# Patient Record
Sex: Female | Born: 1985
Health system: Southern US, Community
[De-identification: ages and names within clinical notes are randomized; demographics above are authoritative.]

## PROBLEM LIST (undated history)

## (undated) ENCOUNTER — Inpatient Hospital Stay: Payer: Self-pay

## (undated) DIAGNOSIS — G4730 Sleep apnea, unspecified: Secondary | ICD-10-CM

## (undated) DIAGNOSIS — F419 Anxiety disorder, unspecified: Secondary | ICD-10-CM

## (undated) DIAGNOSIS — Z87442 Personal history of urinary calculi: Secondary | ICD-10-CM

## (undated) DIAGNOSIS — I10 Essential (primary) hypertension: Secondary | ICD-10-CM

## (undated) DIAGNOSIS — F909 Attention-deficit hyperactivity disorder, unspecified type: Secondary | ICD-10-CM

## (undated) DIAGNOSIS — F32A Depression, unspecified: Secondary | ICD-10-CM

## (undated) DIAGNOSIS — F329 Major depressive disorder, single episode, unspecified: Secondary | ICD-10-CM

## (undated) DIAGNOSIS — M722 Plantar fascial fibromatosis: Secondary | ICD-10-CM

## (undated) DIAGNOSIS — E282 Polycystic ovarian syndrome: Secondary | ICD-10-CM

## (undated) HISTORY — DX: Plantar fascial fibromatosis: M72.2

## (undated) HISTORY — PX: CHOLECYSTECTOMY: SHX55

## (undated) HISTORY — PX: WISDOM TOOTH EXTRACTION: SHX21

---

## 2006-08-20 DIAGNOSIS — E282 Polycystic ovarian syndrome: Secondary | ICD-10-CM

## 2006-08-20 HISTORY — DX: Polycystic ovarian syndrome: E28.2

## 2011-12-05 DIAGNOSIS — E282 Polycystic ovarian syndrome: Secondary | ICD-10-CM | POA: Insufficient documentation

## 2011-12-05 DIAGNOSIS — Z87442 Personal history of urinary calculi: Secondary | ICD-10-CM | POA: Insufficient documentation

## 2011-12-05 DIAGNOSIS — F32A Depression, unspecified: Secondary | ICD-10-CM | POA: Insufficient documentation

## 2011-12-05 DIAGNOSIS — F329 Major depressive disorder, single episode, unspecified: Secondary | ICD-10-CM | POA: Insufficient documentation

## 2012-10-24 DIAGNOSIS — O039 Complete or unspecified spontaneous abortion without complication: Secondary | ICD-10-CM | POA: Insufficient documentation

## 2014-08-06 ENCOUNTER — Emergency Department: Payer: Self-pay | Admitting: Emergency Medicine

## 2014-08-06 LAB — CBC WITH DIFFERENTIAL/PLATELET
BASOS ABS: 0.2 10*3/uL — AB (ref 0.0–0.1)
Basophil %: 1.2 %
Eosinophil #: 0.1 10*3/uL (ref 0.0–0.7)
Eosinophil %: 0.7 %
HCT: 45 % (ref 35.0–47.0)
HGB: 15.1 g/dL (ref 12.0–16.0)
LYMPHS PCT: 3 %
Lymphocyte #: 0.4 10*3/uL — ABNORMAL LOW (ref 1.0–3.6)
MCH: 30.7 pg (ref 26.0–34.0)
MCHC: 33.6 g/dL (ref 32.0–36.0)
MCV: 92 fL (ref 80–100)
MONO ABS: 0.7 x10 3/mm (ref 0.2–0.9)
Monocyte %: 5.2 %
NEUTROS ABS: 12.7 10*3/uL — AB (ref 1.4–6.5)
Neutrophil %: 89.9 %
PLATELETS: 300 10*3/uL (ref 150–440)
RBC: 4.91 10*6/uL (ref 3.80–5.20)
RDW: 13.3 % (ref 11.5–14.5)
WBC: 14.2 10*3/uL — ABNORMAL HIGH (ref 3.6–11.0)

## 2014-08-06 LAB — URINALYSIS, COMPLETE
Bacteria: NONE SEEN
Bilirubin,UR: NEGATIVE
Glucose,UR: NEGATIVE mg/dL (ref 0–75)
Leukocyte Esterase: NEGATIVE
Nitrite: NEGATIVE
PH: 5 (ref 4.5–8.0)
Protein: NEGATIVE
SPECIFIC GRAVITY: 1.025 (ref 1.003–1.030)
WBC UR: 1 /HPF (ref 0–5)

## 2014-08-06 LAB — COMPREHENSIVE METABOLIC PANEL
ALBUMIN: 4.3 g/dL (ref 3.4–5.0)
AST: 33 U/L (ref 15–37)
Alkaline Phosphatase: 115 U/L
Anion Gap: 14 (ref 7–16)
BUN: 15 mg/dL (ref 7–18)
Bilirubin,Total: 0.7 mg/dL (ref 0.2–1.0)
CO2: 20 mmol/L — AB (ref 21–32)
CREATININE: 1 mg/dL (ref 0.60–1.30)
Calcium, Total: 9.3 mg/dL (ref 8.5–10.1)
Chloride: 108 mmol/L — ABNORMAL HIGH (ref 98–107)
EGFR (African American): >60
EGFR (Non-African Amer.): >60
Glucose: 113 mg/dL — ABNORMAL HIGH (ref 65–99)
Osmolality: 285 (ref 275–301)
Potassium: 3.5 mmol/L (ref 3.5–5.1)
SGPT (ALT): 58 U/L
Sodium: 142 mmol/L (ref 136–145)
TOTAL PROTEIN: 8.5 g/dL — AB (ref 6.4–8.2)

## 2014-08-06 LAB — LIPASE, BLOOD: LIPASE: 98 U/L (ref 73–393)

## 2015-09-02 DIAGNOSIS — E669 Obesity, unspecified: Secondary | ICD-10-CM | POA: Diagnosis not present

## 2015-09-02 DIAGNOSIS — Z79899 Other long term (current) drug therapy: Secondary | ICD-10-CM | POA: Diagnosis not present

## 2015-09-02 DIAGNOSIS — Z0000 Encounter for general adult medical examination without abnormal findings: Secondary | ICD-10-CM | POA: Diagnosis not present

## 2015-09-08 DIAGNOSIS — Z0000 Encounter for general adult medical examination without abnormal findings: Secondary | ICD-10-CM | POA: Diagnosis not present

## 2015-09-08 DIAGNOSIS — Z124 Encounter for screening for malignant neoplasm of cervix: Secondary | ICD-10-CM | POA: Diagnosis not present

## 2015-09-08 DIAGNOSIS — Z113 Encounter for screening for infections with a predominantly sexual mode of transmission: Secondary | ICD-10-CM | POA: Diagnosis not present

## 2015-11-03 DIAGNOSIS — E282 Polycystic ovarian syndrome: Secondary | ICD-10-CM | POA: Diagnosis not present

## 2015-11-03 DIAGNOSIS — Z3169 Encounter for other general counseling and advice on procreation: Secondary | ICD-10-CM | POA: Diagnosis not present

## 2015-11-07 DIAGNOSIS — E282 Polycystic ovarian syndrome: Secondary | ICD-10-CM | POA: Diagnosis not present

## 2015-11-08 DIAGNOSIS — N970 Female infertility associated with anovulation: Secondary | ICD-10-CM | POA: Diagnosis not present

## 2015-11-08 DIAGNOSIS — E282 Polycystic ovarian syndrome: Secondary | ICD-10-CM | POA: Diagnosis not present

## 2015-12-02 ENCOUNTER — Ambulatory Visit
Admission: EM | Admit: 2015-12-02 | Discharge: 2015-12-02 | Disposition: A | Discharge status: Home / Self Care | Payer: 59 | Attending: Family Medicine | Admitting: Family Medicine

## 2015-12-02 ENCOUNTER — Encounter: Payer: Self-pay | Admitting: *Deleted

## 2015-12-02 DIAGNOSIS — H1013 Acute atopic conjunctivitis, bilateral: Secondary | ICD-10-CM

## 2015-12-02 DIAGNOSIS — J0110 Acute frontal sinusitis, unspecified: Secondary | ICD-10-CM | POA: Diagnosis not present

## 2015-12-02 MED ORDER — AMOXICILLIN-POT CLAVULANATE 875-125 MG PO TABS: 1.0000 | ORAL_TABLET | Freq: Two times a day (BID) | ORAL | Status: DC

## 2015-12-02 NOTE — Discharge Instructions (Signed)
Take medication as prescribed. Rest. Drink plenty of fluids.   Follow up with your primary care physician this week as needed. Return to Urgent care for new or worsening concerns.    Sinusitis, Adult Sinusitis is redness, soreness, and inflammation of the paranasal sinuses. Paranasal sinuses are air pockets within the bones of your face. They are located beneath your eyes, in the middle of your forehead, and above your eyes. In healthy paranasal sinuses, mucus is able to drain out, and air is able to circulate through them by way of your nose. However, when your paranasal sinuses are inflamed, mucus and air can become trapped. This can allow bacteria and other germs to grow and cause infection. Sinusitis can develop quickly and last only a short time (acute) or continue over a long period (chronic). Sinusitis that lasts for more than 12 weeks is considered chronic. CAUSES Causes of sinusitis include:  Allergies.  Structural abnormalities, such as displacement of the cartilage that separates your nostrils (deviated septum), which can decrease the air flow through your nose and sinuses and affect sinus drainage.  Functional abnormalities, such as when the small hairs (cilia) that line your sinuses and help remove mucus do not work properly or are not present. SIGNS AND SYMPTOMS Symptoms of acute and chronic sinusitis are the same. The primary symptoms are pain and pressure around the affected sinuses. Other symptoms include:  Upper toothache.  Earache.  Headache.  Bad breath.  Decreased sense of smell and taste.  A cough, which worsens when you are lying flat.  Fatigue.  Fever.  Thick drainage from your nose, which often is green and may contain pus (purulent).  Swelling and warmth over the affected sinuses. DIAGNOSIS Your health care provider will perform a physical exam. During your exam, your health care provider may perform any of the following to help determine if you have  acute sinusitis or chronic sinusitis:  Look in your nose for signs of abnormal growths in your nostrils (nasal polyps).  Tap over the affected sinus to check for signs of infection.  View the inside of your sinuses using an imaging device that has a light attached (endoscope). If your health care provider suspects that you have chronic sinusitis, one or more of the following tests may be recommended:  Allergy tests.  Nasal culture. A sample of mucus is taken from your nose, sent to a lab, and screened for bacteria.  Nasal cytology. A sample of mucus is taken from your nose and examined by your health care provider to determine if your sinusitis is related to an allergy. TREATMENT Most cases of acute sinusitis are related to a viral infection and will resolve on their own within 10 days. Sometimes, medicines are prescribed to help relieve symptoms of both acute and chronic sinusitis. These may include pain medicines, decongestants, nasal steroid sprays, or saline sprays. However, for sinusitis related to a bacterial infection, your health care provider will prescribe antibiotic medicines. These are medicines that will help kill the bacteria causing the infection. Rarely, sinusitis is caused by a fungal infection. In these cases, your health care provider will prescribe antifungal medicine. For some cases of chronic sinusitis, surgery is needed. Generally, these are cases in which sinusitis recurs more than 3 times per year, despite other treatments. HOME CARE INSTRUCTIONS  Drink plenty of water. Water helps thin the mucus so your sinuses can drain more easily.  Use a humidifier.  Inhale steam 3-4 times a day (for example, sit in  the bathroom with the shower running).  Apply a warm, moist washcloth to your face 3-4 times a day, or as directed by your health care provider.  Use saline nasal sprays to help moisten and clean your sinuses.  Take medicines only as directed by your health care  provider.  If you were prescribed either an antibiotic or antifungal medicine, finish it all even if you start to feel better. SEEK IMMEDIATE MEDICAL CARE IF:  You have increasing pain or severe headaches.  You have nausea, vomiting, or drowsiness.  You have swelling around your face.  You have vision problems.  You have a stiff neck.  You have difficulty breathing.   This information is not intended to replace advice given to you by your health care provider. Make sure you discuss any questions you have with your health care provider.   Document Released: 08/06/2005 Document Revised: 08/27/2014 Document Reviewed: 08/21/2011 Elsevier Interactive Patient Education 2016 [employer].  Allergic Conjunctivitis A thin, clear membrane (conjunctiva) covers the white part of your eye and the inner surface of your eyelid. Allergic conjunctivitis happens when this membrane gets irritated. This is caused by allergies. Common things (allergens) that can cause an allergic reaction include:  Dust.  Pollen.  Mold.  Animal:  Hair.  Fur.  Skin.  Saliva or other animal fluids. This condition can make your eye red or pink. It can also make your eye feel itchy. This condition cannot be spread by one person to another person (noncontagious).  HOME CARE  Take or apply medicines only as told by your doctor.  Avoid touching or rubbing your eyes.  Apply a cool, clean washcloth to your eye for 10-20 minutes. Do this 3-4 times a day.  If you wear contact lenses, do not wear them until the irritation is gone. Wear glasses in the meantime.  Avoid wearing eye makeup until the irritation is gone.  Try to avoid whatever allergen is causing the allergic reaction. GET HELP IF:  Your symptoms get worse.  You have pus draining from your eyes.  You have new symptoms.  You have a fever.   This information is not intended to replace advice given to you by your health care provider. Make  sure you discuss any questions you have with your health care provider.   Document Released: 01/24/2010 Document Revised: 08/27/2014 Document Reviewed: 05/18/2014 Elsevier Interactive Patient Education [employer].

## 2015-12-02 NOTE — ED Provider Notes (Signed)
[facility]  ____________________________________________  Time seen: Approximately 9:09 AM  I have reviewed the triage vital signs and the nursing notes.   HISTORY  Chief Complaint Eye Drainage and Facial Pain   HPI Rhonda [name] is a 30 y.o. female  presents with complaints of 6 days of runny nose, nasal congestions, sinus drainage. Patient reports recently blowing her nose and getting very thick greenish drainage out. Patient reports that last night she did have some yellowish drainage from both of her eyes that was quick to resolve. Denies any eye itching, eye pain, foreign bodies in the eyes, pinkeye contacts, trauma to the eye, eye pain or vision changes. Patient reports that she often does have some seasonal allergy triggers. States sinuses feel clogged with some pressure in her forehead. Denies vision changes or vision loss. States occasional cough with persistent drainage.  Patient reports that she did take Sudafed at home which seemed to help but not resolve her complaints.  Denies chest pain, shortness of breath, abdominal pain, dysuria, dizziness, weakness, neck pain, back pain, fever or rash. Patient reports that she and her spouse are actively trying to conceive at this time.  Patient's last menstrual period was 11/01/2015 (approximate).   Past medical history. PCOS Depression   There are no active problems to display for this patient.   Past Surgical History  Procedure Laterality Date  . Cholecystectomy      Current Outpatient Rx  Name  Route  Sig  Dispense  Refill  . buPROPion (WELLBUTRIN XL) 300 MG 24 hr tablet   Oral   Take 300 mg by mouth daily.         . metFORMIN (GLUCOPHAGE) 500 MG tablet   Oral   Take 500 mg by mouth 2 (two) times daily with a meal.           Allergies Review of patient's allergies indicates no known allergies.  History reviewed. No pertinent family history.  Social History Social History  Substance Use  Topics  . Smoking status: Never Smoker   . Smokeless tobacco: None  . Alcohol Use: Yes    Review of Systems Constitutional: No fever/chills Eyes: No visual changes. ENT: No sore throat. Positive runny nose, nasal drainage. States occasional cough. Cardiovascular: Denies chest pain. Respiratory: Denies shortness of breath. Gastrointestinal: No abdominal pain.  No nausea, no vomiting.  No diarrhea.  No constipation. Genitourinary: Negative for dysuria. Musculoskeletal: Negative for back pain. Skin: Negative for rash. Neurological: Negative for headaches, focal weakness or numbness.  10-point ROS otherwise negative.  ____________________________________________   PHYSICAL EXAM:  VITAL SIGNS: ED Triage Vitals  Enc Vitals Group     BP 12/02/15 0844 124/80 mmHg     Pulse Rate 12/02/15 0844 79     Resp 12/02/15 0844 16     Temp 12/02/15 0844 98.1 F (36.7 C)     Temp Source 12/02/15 0844 Oral     SpO2 12/02/15 0844 100 %     Weight 12/02/15 0844 245 lb (111.131 kg)     Height 12/02/15 0844 5\' 10"  (1.778 m)     Head Cir --      Peak Flow --      Pain Score --      Pain Loc --      Pain Edu? --      Excl. in [place]? --    Constitutional: Alert and oriented. Well appearing and in no acute distress. Eyes: Conjunctivae are normal. No exudate or drainage.  PERRL. EOMI. No pain with EOMs. No erythema or swelling surrounding bilaterally. No foreign bodies visualized.  Head: Atraumatic.Mild to moderate tenderness to palpation bilateral frontal and maxillary sinuses. No swelling. No erythema.   Ears: no erythema, normal TMs bilaterally.   Nose: nasal congestion with bilateral nasal turbinate erythema and edema.   Mouth/Throat: Mucous membranes are moist.  Oropharynx non-erythematous.No tonsillar swelling or exudate.  Neck: No stridor.  No cervical spine tenderness to palpation. Hematological/Lymphatic/Immunilogical: No cervical lymphadenopathy. Cardiovascular: Normal rate, regular  rhythm. Grossly normal heart sounds.  Good peripheral circulation. Respiratory: Normal respiratory effort.  No retractions. Lungs CTAB. No wheezes, rales or rhonchi. Good air movement.  Gastrointestinal: Soft and nontender. No distention. Normal Bowel sounds. No CVA tenderness. Musculoskeletal: No lower or upper extremity tenderness nor edema.  Bilateral pedal pulses equal and easily palpated. No cervical, thoracic or lumbar tenderness to palpation.  Neurologic:  Normal speech and language. No gross focal neurologic deficits are appreciated. No gait instability. Skin:  Skin is warm, dry and intact. No rash noted. Psychiatric: Mood and affect are normal. Speech and behavior are normal.  ____________________________________________   LABS (all labs ordered are listed, but only abnormal results are displayed)  Labs Reviewed - No data to display ____________________________________________   INITIAL IMPRESSION / ASSESSMENT AND PLAN / ED COURSE  Pertinent labs & imaging results that were available during my care of the patient were reviewed by me and considered in my medical decision making (see chart for details).  Very well-appearing patient. No acute distress. Presents for the complaints of 1 week of runny nose nasal congestion, sinus pressure and sinus drainage. Also reports brief episode of drainage from both eyes yesterday. Lungs clear throughout. Abdomen soft and nontender. Sinus drainage and sinus pressure. Suspect sinusitis. As patient reports that she does have some history of seasonal allergies, eyes complaints quickly resolved, no injection or discharge, suspect allergic conjunctivitis, doubt bacterial conjunctivitis. Will treat with oral Augmentin, encouraged over-the-counter antihistamine such as Claritin only as needed. Discussed with patient as patient states that she is trying to actively conceive, discussed and cautioned in regards to use of over-the-counter medications such as  Sudafed as that would not be recommended in pregnancy especially early pregnancy. Patient verbalized understanding. Encouraged PCP follow up.   Discussed follow up with Primary care physician this week. Discussed follow up and return parameters including no resolution or any worsening concerns. Patient verbalized understanding and agreed to plan.   ____________________________________________   FINAL CLINICAL IMPRESSION(S) / ED DIAGNOSES  Final diagnoses:  Acute frontal sinusitis, recurrence not specified  Allergic conjunctivitis, bilateral      Note: This dictation was prepared with Dragon dictation along with smaller phrase technology. Any transcriptional errors that result from this process are unintentional.    Marylene Land, NP 12/02/15 1251

## 2015-12-02 NOTE — ED Notes (Signed)
Sinus pressure type pain behind eyes and frontal region. Also, states yellow drainage from eyes. Onset Sunday, worse last night and today.

## 2015-12-05 DIAGNOSIS — N970 Female infertility associated with anovulation: Secondary | ICD-10-CM | POA: Diagnosis not present

## 2015-12-15 DIAGNOSIS — N970 Female infertility associated with anovulation: Secondary | ICD-10-CM | POA: Diagnosis not present

## 2015-12-15 DIAGNOSIS — E282 Polycystic ovarian syndrome: Secondary | ICD-10-CM | POA: Diagnosis not present

## 2016-01-05 DIAGNOSIS — N970 Female infertility associated with anovulation: Secondary | ICD-10-CM | POA: Diagnosis not present

## 2016-01-17 DIAGNOSIS — N912 Amenorrhea, unspecified: Secondary | ICD-10-CM | POA: Diagnosis not present

## 2016-01-19 DIAGNOSIS — N912 Amenorrhea, unspecified: Secondary | ICD-10-CM | POA: Diagnosis not present

## 2016-01-26 DIAGNOSIS — O200 Threatened abortion: Secondary | ICD-10-CM | POA: Diagnosis not present

## 2016-02-06 DIAGNOSIS — O200 Threatened abortion: Secondary | ICD-10-CM | POA: Diagnosis not present

## 2016-02-19 DIAGNOSIS — N760 Acute vaginitis: Secondary | ICD-10-CM | POA: Diagnosis not present

## 2016-02-19 DIAGNOSIS — N898 Other specified noninflammatory disorders of vagina: Secondary | ICD-10-CM | POA: Diagnosis not present

## 2016-02-19 DIAGNOSIS — R300 Dysuria: Secondary | ICD-10-CM | POA: Diagnosis not present

## 2016-02-19 DIAGNOSIS — O26891 Other specified pregnancy related conditions, first trimester: Secondary | ICD-10-CM | POA: Diagnosis not present

## 2016-03-01 DIAGNOSIS — Z3481 Encounter for supervision of other normal pregnancy, first trimester: Secondary | ICD-10-CM | POA: Diagnosis not present

## 2016-03-01 DIAGNOSIS — N898 Other specified noninflammatory disorders of vagina: Secondary | ICD-10-CM | POA: Diagnosis not present

## 2016-03-02 ENCOUNTER — Other Ambulatory Visit: Payer: Self-pay | Admitting: Obstetrics and Gynecology

## 2016-03-02 DIAGNOSIS — Z3481 Encounter for supervision of other normal pregnancy, first trimester: Secondary | ICD-10-CM | POA: Diagnosis not present

## 2016-03-02 DIAGNOSIS — N898 Other specified noninflammatory disorders of vagina: Secondary | ICD-10-CM | POA: Diagnosis not present

## 2016-03-02 DIAGNOSIS — Z369 Encounter for antenatal screening, unspecified: Secondary | ICD-10-CM

## 2016-03-13 DIAGNOSIS — B019 Varicella without complication: Secondary | ICD-10-CM | POA: Diagnosis not present

## 2016-03-13 DIAGNOSIS — Z3A12 12 weeks gestation of pregnancy: Secondary | ICD-10-CM | POA: Diagnosis not present

## 2016-03-13 DIAGNOSIS — B029 Zoster without complications: Secondary | ICD-10-CM | POA: Diagnosis not present

## 2016-03-13 DIAGNOSIS — Z331 Pregnant state, incidental: Secondary | ICD-10-CM | POA: Diagnosis not present

## 2016-03-14 ENCOUNTER — Ambulatory Visit: Payer: Self-pay | Admitting: Physician Assistant

## 2016-03-19 ENCOUNTER — Other Ambulatory Visit: Payer: 59

## 2016-03-19 ENCOUNTER — Ambulatory Visit (HOSPITAL_BASED_OUTPATIENT_CLINIC_OR_DEPARTMENT_OTHER)
Admission: RE | Admit: 2016-03-19 | Discharge: 2016-03-19 | Disposition: A | Discharge status: Home / Self Care | Payer: 59 | Source: Ambulatory Visit | Attending: Obstetrics and Gynecology | Admitting: Obstetrics and Gynecology

## 2016-03-19 ENCOUNTER — Ambulatory Visit: Payer: 59

## 2016-03-19 ENCOUNTER — Ambulatory Visit
Admission: RE | Admit: 2016-03-19 | Discharge: 2016-03-19 | Disposition: A | Discharge status: Home / Self Care | Payer: 59 | Source: Ambulatory Visit | Attending: Obstetrics and Gynecology | Admitting: Obstetrics and Gynecology

## 2016-03-19 DIAGNOSIS — Z3A12 12 weeks gestation of pregnancy: Secondary | ICD-10-CM | POA: Insufficient documentation

## 2016-03-19 DIAGNOSIS — Z36 Encounter for antenatal screening of mother: Secondary | ICD-10-CM

## 2016-03-19 DIAGNOSIS — Z3491 Encounter for supervision of normal pregnancy, unspecified, first trimester: Secondary | ICD-10-CM | POA: Diagnosis not present

## 2016-03-19 DIAGNOSIS — Z8279 Family history of other congenital malformations, deformations and chromosomal abnormalities: Secondary | ICD-10-CM | POA: Diagnosis not present

## 2016-03-19 DIAGNOSIS — Z369 Encounter for antenatal screening, unspecified: Secondary | ICD-10-CM | POA: Insufficient documentation

## 2016-03-19 DIAGNOSIS — Z3481 Encounter for supervision of other normal pregnancy, first trimester: Secondary | ICD-10-CM | POA: Insufficient documentation

## 2016-03-19 HISTORY — DX: Polycystic ovarian syndrome: E28.2

## 2016-03-19 NOTE — Progress Notes (Signed)
Referring physician:  [facility] Length of Consultation: 45 minutes   Ms. Marchuk  was referred to [facility] for genetic counseling to review prenatal screening and testing options.  This note summarizes the information we discussed.    We offered the following routine screening tests for this pregnancy:  First trimester screening, which includes nuchal translucency ultrasound screen and first trimester maternal serum marker screening.  The nuchal translucency has approximately an 80% detection rate for Down syndrome and can be positive for other chromosome abnormalities as well as congenital heart defects.  When combined with a maternal serum marker screening, the detection rate is up to 90% for Down syndrome and up to 97% for trisomy 18.     Maternal serum marker screening, a blood test that measures pregnancy proteins, can provide risk assessments for Down syndrome, trisomy 18, and open neural tube defects (spina bifida, anencephaly). Because it does not directly examine the fetus, it cannot positively diagnose or rule out these problems.  Targeted ultrasound uses high frequency sound waves to create an image of the developing fetus.  An ultrasound is often recommended as a routine means of evaluating the pregnancy.  It is also used to screen for fetal anatomy problems (for example, a heart defect) that might be suggestive of a chromosomal or other abnormality.   Should these screening tests indicate an increased concern, then the following additional testing options would be offered:  The chorionic villus sampling procedure is available for first trimester chromosome analysis.  This involves the withdrawal of a small amount of chorionic villi (tissue from the developing placenta).  Risk of pregnancy loss is estimated to be approximately 1 in 200 to 1 in 100 (0.5 to 1%).  There is approximately a 1% (1 in 100) chance that the CVS chromosome results will be  unclear.  Chorionic villi cannot be tested for neural tube defects.     Amniocentesis involves the removal of a small amount of amniotic fluid from the sac surrounding the fetus with the use of a thin needle inserted through the maternal abdomen and uterus.  Ultrasound guidance is used throughout the procedure.  Fetal cells from amniotic fluid are directly evaluated and > 99.5% of chromosome problems and > 98% of open neural tube defects can be detected. This procedure is generally performed after the 15th week of pregnancy.  The main risks to this procedure include complications leading to miscarriage in less than 1 in 200 cases (0.5%).  As another option for information if the pregnancy is suspected to be an an increased chance for certain chromosome conditions, we also reviewed the availability of cell free fetal DNA testing from maternal blood to determine whether or not the baby may have either Down syndrome, trisomy 32, or trisomy 19.  This test utilizes a maternal blood sample and DNA sequencing technology to isolate circulating cell free fetal DNA from maternal plasma.  The fetal DNA can then be analyzed for DNA sequences that are derived from the three most common chromosomes involved in aneuploidy, chromosomes 13, 18, and 21.  If the overall amount of DNA is greater than the expected level for any of these chromosomes, aneuploidy is suspected.  While we do not consider it a replacement for invasive testing and karyotype analysis, a negative result from this testing would be reassuring, though not a guarantee of a normal chromosome complement for the baby.  An abnormal result is certainly suggestive of an abnormal chromosome complement, though  we would still recommend CVS or amniocentesis to confirm any findings from this testing.  Cystic Fibrosis and Spinal Muscular Atrophy (SMA) screening were also discussed with the patient. Both conditions are recessive, which means that both parents must be  carriers in order to have a child with the disease.  Cystic fibrosis (CF) is one of the most common genetic conditions in persons of Caucasian ancestry.  This condition occurs in approximately 1 in 2,500 Caucasian persons and results in thickened secretions in the lungs, digestive, and reproductive systems.  For a baby to be at risk for having CF, both of the parents must be carriers for this condition.  Approximately 1 in 43 Caucasian persons is a carrier for CF.  Current carrier testing looks for the most common mutations in the gene for CF and can detect approximately 90% of carriers in the Caucasian population.  This means that the carrier screening can greatly reduce, but cannot eliminate, the chance for an individual to have a child with CF.  If an individual is found to be a carrier for CF, then carrier testing would be available for the partner. As part of [place] newborn screening profile, all babies born in the state of [place] will have a two-tier screening process.  Specimens are first tested to determine the concentration of immunoreactive trypsinogen (IRT).  The top 5% of specimens with the highest IRT values then undergo DNA testing using a panel of over 40 common CF mutations. SMA is a neurodegenerative disorder that leads to atrophy of skeletal muscle and overall weakness.  This condition is also more prevalent in the Caucasian population, with 1 in 40-1 in 60 persons being a carrier and 1 in 6,000-1 in 10,000 children being affected.  There are multiple forms of the disease, with some causing death in infancy to other forms with survival into adulthood.  The genetics of SMA is complex, but carrier screening can detect up to 95% of carriers in the Caucasian population.  Similar to CF, a negative result can greatly reduce, but cannot eliminate, the chance to have a child with SMA.  We obtained a detailed family history and pregnancy history.  Ms. Fuhrer reported that she has one  paternal first cousin with a structural heart defect and that her mother has a heart murmur.  The father of the baby also reported that his mother had surgery for a structural heart defect in childhood and one of his maternal cousins has a heart defect.  We discussed that congenital heart defects (CHDs) occur in approximately 1 in 200 births.  Congenital heart defects are often thought to be multifactorial, meaning due to complex interactions between genetic and environmental factors, although some specific genetic changes and syndromes are associated with congenital heart defects.  It would be important to know if genetic tests were done on any of these relatives to rule out genetic syndromes in order to better assess the risk in this family.   In the absence of an underlying genetic condition, the chance of congenital heart defects in relatives of individuals with congenital heart defects is likely somewhat increased, though without more information the chances may be difficult to quantify.  For this reason, it would be reasonable to consider a fetal echocardiogram at 22 to 24 weeks in this and subsequent pregnancies for this couple.  The remainder of the family history was reported to be unremarkable for birth defects, mental retardation, recurrent pregnancy loss or known chromosome abnormalities.  Ms. Folkes  stated that this is her third pregnancy.  She reported that her first pregnancy resulted in the loss of twins at [redacted] weeks gestation due to PROM followed by infection.  Chromosome analysis and chromosomal microarray were normal on both twins.  Her second pregnancy resulted in the birth of a healthy son, now 14 years old.  In the current pregnancy, she reports no complications.  She did recently develop shingles, though this is not expected to be harmful to the pregnancy.  She is also prescribed wellbutrin and metformin.  There is limited data on the use of metformin in pregnancy, though it has not bee shown  to increase the risk for birth defects.  Wellbutrin has been suggested in some studies to be associated with structural heart defects, but this data has been inconsistent and not confirmed in other studies.   After consideration of the options, Ms. Heichelbech elected to proceed with first trimester screening and carrier testing for CF and SMA.  An ultrasound was performed at the time of the visit.   Fetal anatomy could not be assessed due to early gestational age.  Please refer to the ultrasound report for details of that study.  Ms. Riemersma was encouraged to call with questions or concerns.  We can be contacted at [phone].    Wilburt Finlay, MS, CGC

## 2016-03-20 DIAGNOSIS — Z331 Pregnant state, incidental: Secondary | ICD-10-CM | POA: Diagnosis not present

## 2016-03-20 DIAGNOSIS — N898 Other specified noninflammatory disorders of vagina: Secondary | ICD-10-CM | POA: Diagnosis not present

## 2016-03-20 DIAGNOSIS — B373 Candidiasis of vulva and vagina: Secondary | ICD-10-CM | POA: Diagnosis not present

## 2016-03-20 LAB — MISC LABCORP TEST (SEND OUT): LABCORP TEST CODE: 450010

## 2016-03-22 ENCOUNTER — Telehealth: Payer: Self-pay | Admitting: Obstetrics and Gynecology

## 2016-03-22 NOTE — Telephone Encounter (Signed)
  Ms. Mosbey elected to undergo First Trimester screening on 03/19/2016.  To review, first trimester screening, includes nuchal translucency ultrasound screen and/or first trimester maternal serum marker screening.  The nuchal translucency has approximately an 80% detection rate for Down syndrome and can be positive for other chromosome abnormalities as well as heart defects.  When combined with a maternal serum marker screening, the detection rate is up to 90% for Down syndrome and up to 97% for trisomy 13 and 18.     The results of the First Trimester Nuchal Translucency and Biochemical Screening were within normal range.  The risk for Down syndrome is now estimated to be less than 1 in 10,000.  The risk for Trisomy 13/18 is also estimated to be less than 1 in 10,000.  Should more definitive information be desired, we would offer amniocentesis.  Because we do not yet know the effectiveness of combined first and second trimester screening, we do not recommend a maternal serum screen to assess the chance for chromosome conditions.  However, if screening for neural tube defects is desired, maternal serum screening for AFP only can be performed between 15 and [redacted] weeks gestation.     Wilburt Finlay, MS, CGC

## 2016-03-26 LAB — CYSTIC FIBROSIS GENE TEST

## 2016-03-29 DIAGNOSIS — N760 Acute vaginitis: Secondary | ICD-10-CM | POA: Diagnosis not present

## 2016-04-02 ENCOUNTER — Telehealth: Payer: Self-pay | Admitting: Obstetrics and Gynecology

## 2016-04-02 NOTE — Telephone Encounter (Signed)
We are pleased to inform Rhonda [name] that the results of the recent screening test for Cystic fibrosis (CF) and Spinal muscular atrophy (SMA)  are now available.    The results of the SMA carrier screening are normal.  SMA is also a recessive genetic condition with variable age of onset and severity caused by mutations in the SMN1 gene.  This carrier testing assesses the number of copies of this gene.  Persons with one copy of the SMN1 gene are carriers, and those with no copies are affected with the condition.  Individuals with two or more copies have a reduced chance to be a carrier.  Not all mutations can be detected with this testing, though it can detect 94.8% of carriers in the Caucasian population.  The results revealed that Rhonda [name] has an SMN1 copy number of 2, thus reducing her chance to be a carrier from 1 in 66 to 1 in 834.  Again, this testing cannot eliminate the chance to have a child with SMA, but dramatically reduces the chance.    CF is a genetic condition that occurs most often in Caucasian persons.  It primarily affects the lungs, digestive, and reproductive systems.  For someone to be at risk for having CF, both of their parents must be carriers for CF.  The testing can detect many persons who are carriers for CF and therefore determine if the pregnancy is at an increased risk for this condition.    The blood test results were negative when examined for the 32 most common mutations (or changes) in the gene for CF.  This means that she does not carry any of the most common changes in this gene.  Testing for these 23 mutations detects approximately 90% of carriers who are Caucasian.  Therefore, the chance that she is a carrier based on this negative result has been reduced from 1 in 25 to approximately 1 in 240.  Because this testing cannot detect all changes that may cause CF, we cannot eliminate the chance that this individual is a carrier completely.  If there are any questions or  concerns, please feel free to contact our office at [phone].      Wilburt Finlay, MS, CGC

## 2016-04-09 DIAGNOSIS — O09292 Supervision of pregnancy with other poor reproductive or obstetric history, second trimester: Secondary | ICD-10-CM | POA: Diagnosis not present

## 2016-04-09 DIAGNOSIS — O09212 Supervision of pregnancy with history of pre-term labor, second trimester: Secondary | ICD-10-CM | POA: Diagnosis not present

## 2016-04-11 DIAGNOSIS — O09212 Supervision of pregnancy with history of pre-term labor, second trimester: Secondary | ICD-10-CM | POA: Diagnosis not present

## 2016-04-12 DIAGNOSIS — B9689 Other specified bacterial agents as the cause of diseases classified elsewhere: Secondary | ICD-10-CM | POA: Diagnosis not present

## 2016-04-12 DIAGNOSIS — N760 Acute vaginitis: Secondary | ICD-10-CM | POA: Diagnosis not present

## 2016-04-12 DIAGNOSIS — N898 Other specified noninflammatory disorders of vagina: Secondary | ICD-10-CM | POA: Diagnosis not present

## 2016-04-17 DIAGNOSIS — B029 Zoster without complications: Secondary | ICD-10-CM | POA: Insufficient documentation

## 2016-04-18 DIAGNOSIS — O09212 Supervision of pregnancy with history of pre-term labor, second trimester: Secondary | ICD-10-CM | POA: Diagnosis not present

## 2016-04-25 DIAGNOSIS — Z8751 Personal history of pre-term labor: Secondary | ICD-10-CM | POA: Diagnosis not present

## 2016-04-29 DIAGNOSIS — N760 Acute vaginitis: Secondary | ICD-10-CM | POA: Diagnosis not present

## 2016-05-02 DIAGNOSIS — Z8751 Personal history of pre-term labor: Secondary | ICD-10-CM | POA: Diagnosis not present

## 2016-05-04 DIAGNOSIS — O09212 Supervision of pregnancy with history of pre-term labor, second trimester: Secondary | ICD-10-CM | POA: Diagnosis not present

## 2016-05-04 DIAGNOSIS — O09292 Supervision of pregnancy with other poor reproductive or obstetric history, second trimester: Secondary | ICD-10-CM | POA: Diagnosis not present

## 2016-05-07 ENCOUNTER — Observation Stay
Admission: EM | Admit: 2016-05-07 | Discharge: 2016-05-07 | Disposition: A | Discharge status: Home / Self Care | Payer: 59 | Attending: Obstetrics & Gynecology | Admitting: Obstetrics & Gynecology

## 2016-05-07 ENCOUNTER — Encounter: Payer: Self-pay | Admitting: *Deleted

## 2016-05-07 DIAGNOSIS — R102 Pelvic and perineal pain: Secondary | ICD-10-CM | POA: Diagnosis not present

## 2016-05-07 DIAGNOSIS — O09292 Supervision of pregnancy with other poor reproductive or obstetric history, second trimester: Secondary | ICD-10-CM | POA: Diagnosis not present

## 2016-05-07 DIAGNOSIS — O26892 Other specified pregnancy related conditions, second trimester: Secondary | ICD-10-CM | POA: Diagnosis not present

## 2016-05-07 DIAGNOSIS — Z369 Encounter for antenatal screening, unspecified: Secondary | ICD-10-CM

## 2016-05-07 DIAGNOSIS — Z3A20 20 weeks gestation of pregnancy: Secondary | ICD-10-CM | POA: Diagnosis not present

## 2016-05-07 DIAGNOSIS — Z3492 Encounter for supervision of normal pregnancy, unspecified, second trimester: Secondary | ICD-10-CM | POA: Diagnosis not present

## 2016-05-07 DIAGNOSIS — Z3490 Encounter for supervision of normal pregnancy, unspecified, unspecified trimester: Secondary | ICD-10-CM

## 2016-05-07 HISTORY — DX: Major depressive disorder, single episode, unspecified: F32.9

## 2016-05-07 HISTORY — DX: Depression, unspecified: F32.A

## 2016-05-07 LAB — URINALYSIS COMPLETE WITH MICROSCOPIC (ARMC ONLY)
BACTERIA UA: NONE SEEN
BILIRUBIN URINE: NEGATIVE
GLUCOSE, UA: NEGATIVE mg/dL
Hgb urine dipstick: NEGATIVE
Ketones, ur: NEGATIVE mg/dL
Leukocytes, UA: NEGATIVE
Nitrite: NEGATIVE
Protein, ur: NEGATIVE mg/dL
RBC / HPF: NONE SEEN RBC/hpf (ref 0–5)
SQUAMOUS EPITHELIAL / LPF: NONE SEEN
Specific Gravity, Urine: 1.001 — ABNORMAL LOW (ref 1.005–1.030)
pH: 7 (ref 5.0–8.0)

## 2016-05-07 NOTE — Progress Notes (Signed)
Dr Leonides Schanz given report,  requesting a v/e, and another doppler of FHR. If no concerns- pt maybe d/c'ed home with instructions to keep next appointment

## 2016-05-07 NOTE — Discharge Summary (Signed)
Patient discharged home, discharge instructions given, patient states understanding. Patient left floor in stable condition, denies any other needs at this time. Patient to keep next scheduled OB appointment 

## 2016-05-07 NOTE — Discharge Instructions (Signed)
Drink plenty of fluid and get plenty of rest. Call your provider for any other concerns °

## 2016-05-14 NOTE — Discharge Summary (Signed)
Rhonda [name] is a 30 y.o. female. She is at [redacted] weeks gestation.  Chief Complaint: pelvic pressure  S: Resting comfortably. no CTX, no VB.no LOF,  Active fetal movement.   Location: pelvis Context: Rhonda [name] has a history of a 19 week loss, and began feeling pelvic pressure today which was similar to the experience she had during that time.  This concerned her and she sought attention. Onset/timing/duration: recent Quality: pressure Severity:  mild Aggravating factors: none Alleviating factors: none Associated signs/symptoms: denies cramping, back pain, dysuria, blood in urine or from vagina, no constipation/diarrhea, no recent trauma.   Maternal Medical History:   Past Medical History:  Diagnosis Date  . Depression    treated  . PCOS (polycystic ovarian syndrome) 2008    Past Surgical History:  Procedure Laterality Date  . CHOLECYSTECTOMY      No Known Allergies  Prior to Admission medications   Medication Sig Start Date End Date Taking? Authorizing Provider  buPROPion (WELLBUTRIN XL) 300 MG 24 hr tablet Take 300 mg by mouth daily.   Yes Historical Provider, MD  folic acid (FOLVITE) 1 MG tablet Take 1 mg by mouth daily.   Yes Historical Provider, MD  metFORMIN (GLUCOPHAGE) 500 MG tablet Take 500 mg by mouth 2 (two) times daily with a meal.   Yes Historical Provider, MD  Prenatal Vit-Fe Fumarate-FA (PRENATAL MULTIVITAMIN) TABS tablet Take 1 tablet by mouth daily at 12 noon.   Yes Historical Provider, MD     Prenatal care site: [place] History: She  reports that she has never smoked. She has never used smokeless tobacco. She reports that she does not drink alcohol or use drugs.  Family History: family history includes Diabetes in her paternal grandmother.   Review of Systems: A full review of systems was performed and negative except as noted in the HPI.     O:  BP 129/74   Pulse 79   Resp 18   Ht 5\' 10"  (1.778 m)   Wt 116.6 kg (257 lb)   LMP  12/15/2015   BMI 36.88 kg/m   Constitutional: NAD, AAOx3  HE/ENT: extraocular movements grossly intact, moist mucous membranes CV: RRR PULM: nl respiratory effort, CTABL     Abd: gravid, non-tender, non-distended, soft      Ext: Non-tender, Nonedmeatous   Psych: mood appropriate, speech normal Pelvic: external normal, no discharge, no leaking fluid, cervix closed/long/high  FHT: 138 TOCO: quiet    A/P:  30yo G3P1101 @ 20weeks with pelvic pressure.   Labor: not present.   IUP: stable, no signs of compromise   D/c home stable, precautions reviewed, follow-up as scheduled.   ----- Larey Days, MD Attending Obstetrician and Gynecologist [facility], Department of [facility]

## 2016-05-16 DIAGNOSIS — Z8751 Personal history of pre-term labor: Secondary | ICD-10-CM | POA: Diagnosis not present

## 2016-05-23 DIAGNOSIS — Z8751 Personal history of pre-term labor: Secondary | ICD-10-CM | POA: Diagnosis not present

## 2016-05-28 DIAGNOSIS — O09212 Supervision of pregnancy with history of pre-term labor, second trimester: Secondary | ICD-10-CM | POA: Diagnosis not present

## 2016-05-28 DIAGNOSIS — O09292 Supervision of pregnancy with other poor reproductive or obstetric history, second trimester: Secondary | ICD-10-CM | POA: Diagnosis not present

## 2016-05-30 DIAGNOSIS — Z8751 Personal history of pre-term labor: Secondary | ICD-10-CM | POA: Diagnosis not present

## 2016-05-31 DIAGNOSIS — N760 Acute vaginitis: Secondary | ICD-10-CM | POA: Diagnosis not present

## 2016-06-06 DIAGNOSIS — Z8751 Personal history of pre-term labor: Secondary | ICD-10-CM | POA: Diagnosis not present

## 2016-06-14 DIAGNOSIS — Z8751 Personal history of pre-term labor: Secondary | ICD-10-CM | POA: Diagnosis not present

## 2016-06-18 DIAGNOSIS — O26892 Other specified pregnancy related conditions, second trimester: Secondary | ICD-10-CM | POA: Diagnosis not present

## 2016-06-18 DIAGNOSIS — N898 Other specified noninflammatory disorders of vagina: Secondary | ICD-10-CM | POA: Diagnosis not present

## 2016-06-20 DIAGNOSIS — Z8751 Personal history of pre-term labor: Secondary | ICD-10-CM | POA: Diagnosis not present

## 2016-06-25 DIAGNOSIS — O09292 Supervision of pregnancy with other poor reproductive or obstetric history, second trimester: Secondary | ICD-10-CM | POA: Diagnosis not present

## 2016-06-27 DIAGNOSIS — Z8751 Personal history of pre-term labor: Secondary | ICD-10-CM | POA: Diagnosis not present

## 2016-06-29 DIAGNOSIS — Z3482 Encounter for supervision of other normal pregnancy, second trimester: Secondary | ICD-10-CM | POA: Diagnosis not present

## 2016-06-29 DIAGNOSIS — Z3480 Encounter for supervision of other normal pregnancy, unspecified trimester: Secondary | ICD-10-CM | POA: Diagnosis not present

## 2016-07-02 DIAGNOSIS — Z23 Encounter for immunization: Secondary | ICD-10-CM | POA: Diagnosis not present

## 2016-07-04 DIAGNOSIS — O09212 Supervision of pregnancy with history of pre-term labor, second trimester: Secondary | ICD-10-CM | POA: Diagnosis not present

## 2016-07-04 DIAGNOSIS — Z8751 Personal history of pre-term labor: Secondary | ICD-10-CM | POA: Diagnosis not present

## 2016-07-11 DIAGNOSIS — Z8751 Personal history of pre-term labor: Secondary | ICD-10-CM | POA: Diagnosis not present

## 2016-07-16 DIAGNOSIS — N898 Other specified noninflammatory disorders of vagina: Secondary | ICD-10-CM | POA: Diagnosis not present

## 2016-07-16 DIAGNOSIS — N765 Ulceration of vagina: Secondary | ICD-10-CM | POA: Diagnosis not present

## 2016-07-16 DIAGNOSIS — O26893 Other specified pregnancy related conditions, third trimester: Secondary | ICD-10-CM | POA: Diagnosis not present

## 2016-07-18 DIAGNOSIS — O09213 Supervision of pregnancy with history of pre-term labor, third trimester: Secondary | ICD-10-CM | POA: Diagnosis not present

## 2016-07-23 DIAGNOSIS — O09292 Supervision of pregnancy with other poor reproductive or obstetric history, second trimester: Secondary | ICD-10-CM | POA: Diagnosis not present

## 2016-07-25 DIAGNOSIS — Z8751 Personal history of pre-term labor: Secondary | ICD-10-CM | POA: Diagnosis not present

## 2016-07-31 DIAGNOSIS — O09213 Supervision of pregnancy with history of pre-term labor, third trimester: Secondary | ICD-10-CM | POA: Diagnosis not present

## 2016-07-31 DIAGNOSIS — Z8751 Personal history of pre-term labor: Secondary | ICD-10-CM | POA: Diagnosis not present

## 2016-08-07 DIAGNOSIS — N898 Other specified noninflammatory disorders of vagina: Secondary | ICD-10-CM | POA: Diagnosis not present

## 2016-08-07 DIAGNOSIS — O26893 Other specified pregnancy related conditions, third trimester: Secondary | ICD-10-CM | POA: Diagnosis not present

## 2016-08-07 DIAGNOSIS — Z8751 Personal history of pre-term labor: Secondary | ICD-10-CM | POA: Diagnosis not present

## 2016-08-07 DIAGNOSIS — O09212 Supervision of pregnancy with history of pre-term labor, second trimester: Secondary | ICD-10-CM | POA: Diagnosis not present

## 2016-08-14 DIAGNOSIS — Z8751 Personal history of pre-term labor: Secondary | ICD-10-CM | POA: Diagnosis not present

## 2016-08-20 NOTE — L&D Delivery Note (Signed)
Delivery Note At 5:21 AM a viable and healthy female "Rhonda [name]" was delivered via Vaginal, Spontaneous Delivery (Presentation: OA).  APGAR: 8,9 ; weight pending.   Placenta status: spontaneous, intact.  Cord: 3VC  with the following complications: none.   Anesthesia:  epidural Episiotomy: None Lacerations:  1st deg Suture Repair: 2.0 3.0 vicryl Est. Blood Loss (mL):  300  Mom to postpartum.  Baby to Couplet care / Skin to Skin.  31yo G3P1011 at 39+6wks, admitted for iol for gHTN with a 4cm cervix and GBS pos. She progressed quickly on pitocin just before delivery, from 5cm to 6cm in the hour before delivery. She progressed from 6cm to 10 cm with pushing in 10 min. Her membranes did not rupture until the fetal head was emerging. I was called at 0519 and she delivered at 0521. She was attended by the nursing staff and the MD entered the room as cord was being clamped. The placenta delivered spontaneously and was intact.  A 1st deg stellate lac was repaired with 2-0 and 3-0 vicryl and a large clot extracted from the lower uterine segment. The baby was placed promptly on the maternal abdomen and was nursing when I left the room.  Benjaman Kindler 09/19/2016, 5:51 AM

## 2016-08-21 DIAGNOSIS — Z8751 Personal history of pre-term labor: Secondary | ICD-10-CM | POA: Diagnosis not present

## 2016-08-28 DIAGNOSIS — Z3483 Encounter for supervision of other normal pregnancy, third trimester: Secondary | ICD-10-CM | POA: Diagnosis not present

## 2016-09-14 ENCOUNTER — Inpatient Hospital Stay
Admission: EM | Admit: 2016-09-14 | Discharge: 2016-09-14 | Disposition: A | Discharge status: Home / Self Care | Payer: 59 | Attending: Obstetrics and Gynecology | Admitting: Obstetrics and Gynecology

## 2016-09-14 DIAGNOSIS — O4292 Full-term premature rupture of membranes, unspecified as to length of time between rupture and onset of labor: Secondary | ICD-10-CM | POA: Insufficient documentation

## 2016-09-14 DIAGNOSIS — Z369 Encounter for antenatal screening, unspecified: Secondary | ICD-10-CM

## 2016-09-14 DIAGNOSIS — Z3A39 39 weeks gestation of pregnancy: Secondary | ICD-10-CM | POA: Diagnosis not present

## 2016-09-14 NOTE — Discharge Instructions (Signed)
Discharge instructions given, patient stated understanding. Labor precautions given.

## 2016-09-14 NOTE — OB Triage Provider Note (Signed)
TRIAGE VISIT with NST  CC: Leaking fluid in pregnancy   Rhonda [name] is a 30 y.o. G3P1101. She is at [redacted]w[redacted]d gestation.  S: Resting comfortably. no CTX, no VB. Active fetal movement. Concerned about leaking  At 0830 this morning.  O:  BP 133/85   Pulse 83   LMP 12/15/2015  No results found for this or any previous visit (from the past 48 hour(s)).   Gen: NAD, AAOx3      Abd: FNTTP      Ext: Non-tender, Nonedmeatous    FHT: 135, mod var, +accels, no decels TOCO: quiet SVE: Dilation: 3 Effacement (%): 80 Cervical Position: Posterior Station: -3 Presentation: Vertex Exam by:: JCM   A/P:  31 y.o. G3P1101 at [redacted]w[redacted]d with R/o ROM.   Labor: not present.   R/o ROM: SSE negative x 2. Nit neg, no leaking  FWB: NST is Reassuring Cat 1 tracing.  D/c home stable, precautions reviewed, follow-up as scheduled.

## 2016-09-14 NOTE — OB Triage Note (Signed)
Pt arrived to OBS rm 3 with c/o LOF starting at 0830 this morning. Some non painful contractions. Pt placed on monitor and oriented to room.

## 2016-09-18 ENCOUNTER — Inpatient Hospital Stay
Admission: EM | Admit: 2016-09-18 | Discharge: 2016-09-20 | DRG: 775 | Disposition: A | Discharge status: Home / Self Care | Payer: 59 | Attending: Obstetrics and Gynecology | Admitting: Obstetrics and Gynecology

## 2016-09-18 DIAGNOSIS — Z6838 Body mass index (BMI) 38.0-38.9, adult: Secondary | ICD-10-CM

## 2016-09-18 DIAGNOSIS — O99214 Obesity complicating childbirth: Secondary | ICD-10-CM | POA: Diagnosis present

## 2016-09-18 DIAGNOSIS — Z369 Encounter for antenatal screening, unspecified: Secondary | ICD-10-CM

## 2016-09-18 DIAGNOSIS — O99820 Streptococcus B carrier state complicating pregnancy: Secondary | ICD-10-CM | POA: Diagnosis present

## 2016-09-18 DIAGNOSIS — O134 Gestational [pregnancy-induced] hypertension without significant proteinuria, complicating childbirth: Principal | ICD-10-CM | POA: Diagnosis present

## 2016-09-18 DIAGNOSIS — E669 Obesity, unspecified: Secondary | ICD-10-CM | POA: Diagnosis present

## 2016-09-18 DIAGNOSIS — Z3A39 39 weeks gestation of pregnancy: Secondary | ICD-10-CM

## 2016-09-18 DIAGNOSIS — Z833 Family history of diabetes mellitus: Secondary | ICD-10-CM

## 2016-09-18 DIAGNOSIS — O139 Gestational [pregnancy-induced] hypertension without significant proteinuria, unspecified trimester: Secondary | ICD-10-CM | POA: Diagnosis present

## 2016-09-18 DIAGNOSIS — O99824 Streptococcus B carrier state complicating childbirth: Secondary | ICD-10-CM | POA: Diagnosis not present

## 2016-09-18 LAB — PROTEIN / CREATININE RATIO, URINE
CREATININE, URINE: 80 mg/dL
Protein Creatinine Ratio: 0.09 mg/mg{Cre} (ref 0.00–0.15)
Total Protein, Urine: 7 mg/dL

## 2016-09-18 LAB — CBC
HCT: 37.2 % (ref 35.0–47.0)
Hemoglobin: 13.1 g/dL (ref 12.0–16.0)
MCH: 31.4 pg (ref 26.0–34.0)
MCHC: 35.3 g/dL (ref 32.0–36.0)
MCV: 88.9 fL (ref 80.0–100.0)
PLATELETS: 258 10*3/uL (ref 150–440)
RBC: 4.19 MIL/uL (ref 3.80–5.20)
RDW: 13.6 % (ref 11.5–14.5)
WBC: 10.8 10*3/uL (ref 3.6–11.0)

## 2016-09-18 LAB — COMPREHENSIVE METABOLIC PANEL
ALK PHOS: 166 U/L — AB (ref 38–126)
ALT: 16 U/L (ref 14–54)
ANION GAP: 9 (ref 5–15)
AST: 22 U/L (ref 15–41)
Albumin: 3.4 g/dL — ABNORMAL LOW (ref 3.5–5.0)
BILIRUBIN TOTAL: 0.6 mg/dL (ref 0.3–1.2)
BUN: 9 mg/dL (ref 6–20)
CO2: 19 mmol/L — AB (ref 22–32)
CREATININE: 0.61 mg/dL (ref 0.44–1.00)
Calcium: 8.7 mg/dL — ABNORMAL LOW (ref 8.9–10.3)
Chloride: 104 mmol/L (ref 101–111)
GFR calc non Af Amer: >60 mL/min (ref >60)
Glucose, Bld: 74 mg/dL (ref 65–99)
Potassium: 3.6 mmol/L (ref 3.5–5.1)
SODIUM: 132 mmol/L — AB (ref 135–145)
TOTAL PROTEIN: 7 g/dL (ref 6.5–8.1)

## 2016-09-18 LAB — TYPE AND SCREEN
ABO/RH(D): A POS
ANTIBODY SCREEN: NEGATIVE

## 2016-09-18 LAB — URIC ACID: Uric Acid, Serum: 5.3 mg/dL (ref 2.3–6.6)

## 2016-09-18 MED ORDER — SODIUM CHLORIDE 0.9 % IV SOLN: 1.0000 g | Freq: Four times a day (QID) | INTRAVENOUS | Status: DC

## 2016-09-18 MED ORDER — HYDRALAZINE HCL 20 MG/ML IJ SOLN
10.0000 mg | Freq: Once | INTRAMUSCULAR | Status: DC | PRN
  Filled 2016-09-18: qty 1

## 2016-09-18 MED ORDER — SODIUM CHLORIDE 0.9 % IV SOLN
2.0000 g | Freq: Once | INTRAVENOUS | Status: AC
  Administered 2016-09-18: 2 g via INTRAVENOUS
  Filled 2016-09-18: qty 2000

## 2016-09-18 MED ORDER — OXYTOCIN BOLUS FROM INFUSION
500.0000 mL | Freq: Once | INTRAVENOUS | Status: AC
  Administered 2016-09-19: 500 mL via INTRAVENOUS

## 2016-09-18 MED ORDER — OXYTOCIN 40 UNITS IN LACTATED RINGERS INFUSION - SIMPLE MED: 2.5000 [IU]/h | INTRAVENOUS | Status: DC

## 2016-09-18 MED ORDER — MISOPROSTOL 200 MCG PO TABS
ORAL_TABLET | ORAL | Status: AC
  Filled 2016-09-18: qty 4

## 2016-09-18 MED ORDER — SODIUM CHLORIDE 0.9 % IV SOLN
1.0000 g | INTRAVENOUS | Status: DC
  Administered 2016-09-18 – 2016-09-19 (×2): 1 g via INTRAVENOUS
  Filled 2016-09-18 (×8): qty 1000

## 2016-09-18 MED ORDER — AMMONIA AROMATIC IN INHA
RESPIRATORY_TRACT | Status: AC
  Filled 2016-09-18: qty 10

## 2016-09-18 MED ORDER — LABETALOL HCL 5 MG/ML IV SOLN
20.0000 mg | INTRAVENOUS | Status: DC | PRN
  Filled 2016-09-18: qty 16

## 2016-09-18 MED ORDER — LACTATED RINGERS IV SOLN
500.0000 mL | INTRAVENOUS | Status: DC | PRN
  Administered 2016-09-19: 500 mL via INTRAVENOUS

## 2016-09-18 MED ORDER — LIDOCAINE HCL (PF) 1 % IJ SOLN
30.0000 mL | INTRAMUSCULAR | Status: AC | PRN
  Administered 2016-09-19: 1.5 mL via SUBCUTANEOUS

## 2016-09-18 MED ORDER — LACTATED RINGERS IV SOLN
INTRAVENOUS | Status: DC
  Administered 2016-09-18 – 2016-09-19 (×2): via INTRAVENOUS

## 2016-09-18 MED ORDER — OXYTOCIN 10 UNIT/ML IJ SOLN
INTRAMUSCULAR | Status: AC
  Filled 2016-09-18: qty 2

## 2016-09-18 MED ORDER — TERBUTALINE SULFATE 1 MG/ML IJ SOLN: 0.2500 mg | Freq: Once | INTRAMUSCULAR | Status: DC | PRN

## 2016-09-18 MED ORDER — OXYTOCIN 40 UNITS IN LACTATED RINGERS INFUSION - SIMPLE MED: 1.0000 m[IU]/min | INTRAVENOUS | Status: DC

## 2016-09-18 MED ORDER — ACETAMINOPHEN 325 MG PO TABS: 650.0000 mg | ORAL_TABLET | ORAL | Status: DC | PRN

## 2016-09-18 MED ORDER — LIDOCAINE HCL (PF) 1 % IJ SOLN
INTRAMUSCULAR | Status: AC
  Filled 2016-09-18: qty 30

## 2016-09-18 MED ORDER — SOD CITRATE-CITRIC ACID 500-334 MG/5ML PO SOLN: 30.0000 mL | ORAL | Status: DC | PRN

## 2016-09-18 MED ORDER — BUTORPHANOL TARTRATE 1 MG/ML IJ SOLN
1.0000 mg | INTRAMUSCULAR | Status: DC | PRN
  Administered 2016-09-19: 1 mg via INTRAVENOUS
  Filled 2016-09-18: qty 1

## 2016-09-18 MED ORDER — OXYTOCIN 40 UNITS IN LACTATED RINGERS INFUSION - SIMPLE MED
1.0000 m[IU]/min | INTRAVENOUS | Status: DC
  Administered 2016-09-18: 2 m[IU]/min via INTRAVENOUS
  Filled 2016-09-18: qty 1000

## 2016-09-18 MED ORDER — ONDANSETRON HCL 4 MG/2ML IJ SOLN: 4.0000 mg | Freq: Four times a day (QID) | INTRAMUSCULAR | Status: DC | PRN

## 2016-09-18 NOTE — Progress Notes (Addendum)
Patient ID: Rhonda [name], female   DOB: April 23, 1986, 31 y.o.   MRN: [id number]   Assuming care. Pt iol for elevated BP in the office with advanced cervical dilation and GBS+. BP <140/90 here. Denies pree sx. No swelling. Now s/p dinner, will start iol with pitocin for cervix 4cm. EFW 7#6oz. Pelvis proven to 7#14oz.  Amp started.  FHT: 120, mod var, +accels, no decels Toco- flat SVE : deferred

## 2016-09-18 NOTE — H&P (Signed)
OB ADMISSION/ HISTORY & PHYSICAL:  Admission Date: 09/18/2016  4:46 PM  Admit Diagnosis: IOL at 39+5 weeks for GHTN   Rhonda [name] is a 31 y.o. female presenting from the office today with new onset gestational hypertension and advanced cervical dilation and GBS Positive.    Prenatal History: G3P1101   EDC : 09/20/2016, by Last Menstrual Period  Prenatal care at [facility]  Prenatal course complicated by 19 week loss of twins s/p 17-P, Obesity, Zoster infection in pregnancy with Acyclovir treatment, GBS Positive   Prenatal Labs: ABO, Rh:  A Positive Antibody:  Negative Rubella:   Immune Varicella: Immune RPR:   NR HBsAg:   Negative HIV:   Negative GTT: 105 GBS:   POSITIVE   Flu: UTD Tdap: 09/02/15 1st trimester genetic screening: WNL  Medical / Surgical History :  Past medical history:  Past Medical History:  Diagnosis Date  . Depression    treated  . PCOS (polycystic ovarian syndrome) 2008     Past surgical history:  Past Surgical History:  Procedure Laterality Date  . CHOLECYSTECTOMY      Family History:  Family History  Problem Relation Age of Onset  . Diabetes Paternal Grandmother      Social History:  reports that she has never smoked. She has never used smokeless tobacco. She reports that she does not drink alcohol or use drugs.   Allergies: Patient has no known allergies.    Current Medications at time of admission:  Prior to Admission medications   Medication Sig Start Date End Date Taking? Authorizing Provider  buPROPion (WELLBUTRIN XL) 300 MG 24 hr tablet Take 300 mg by mouth daily.   Yes Historical Provider, MD  folic acid (FOLVITE) 1 MG tablet Take 1 mg by mouth daily.   Yes Historical Provider, MD  metFORMIN (GLUCOPHAGE) 500 MG tablet Take 500 mg by mouth 2 (two) times daily with a meal.   Yes Historical Provider, MD  Prenatal Vit-Fe Fumarate-FA (PRENATAL MULTIVITAMIN) TABS tablet Take 1 tablet by mouth daily at 12 noon.   Yes Historical  Provider, MD     Review of Systems: Active FM Irregular ctxs No LOF  / SROM  No bloody show  Denies HA, visual disturbances, epigastric pain   Physical Exam:  VS: Blood pressure 135/84, pulse 74, temperature 98.1 F (36.7 C), temperature source Oral, resp. rate 18, height 5\' 10"  (1.778 m), weight 122.5 kg (270 lb), last menstrual period 12/15/2015.  General: alert and oriented, appears calm  Heart: RRR Lungs: Clear lung fields Abdomen: Gravid, soft and non-tender, non-distended / uterus: gravid, non-tender Extremities: no edema  Genitalia / VE:  4cm/70%/-3/vtx   FHR: baseline rate 135 bpm / variability moderate / accelerations + / occasional variable decelerations TOCO: irregular   Assessment: 39+[redacted] weeks gestation Induction stage of labor for gHTN GBS Positive Advanced cervical dilation  FHR category 2   Plan:  1. Admit to Birth Place for Induction for gHTN    - Routine labor and delivery orders    - PIH Labs    - Notify for BP >160/100    - Stadol IV PRN or epidural upon request 2. GBS Positive    - Ampicillin 2 grams IV x 1, then 1 gram every 6 hours for prophylaxis 3. Postpartum    - Breast feeding    - Contraception: unsure  4. Anticipate NSVD    - Proven pelvis: 7#14.1 oz   Dr. Leafy Ro notified of admission / plan of care  Lars Pinks, CNM

## 2016-09-19 ENCOUNTER — Inpatient Hospital Stay: Payer: 59 | Admitting: Anesthesiology

## 2016-09-19 LAB — RPR: RPR Ser Ql: NONREACTIVE

## 2016-09-19 MED ORDER — ZOLPIDEM TARTRATE 5 MG PO TABS: 5.0000 mg | ORAL_TABLET | Freq: Every evening | ORAL | Status: DC | PRN

## 2016-09-19 MED ORDER — DIBUCAINE 1 % RE OINT: 1.0000 "application " | TOPICAL_OINTMENT | RECTAL | Status: DC | PRN

## 2016-09-19 MED ORDER — WITCH HAZEL-GLYCERIN EX PADS: 1.0000 "application " | MEDICATED_PAD | CUTANEOUS | Status: DC | PRN

## 2016-09-19 MED ORDER — FENTANYL 2.5 MCG/ML W/ROPIVACAINE 0.2% IN NS 100 ML EPIDURAL INFUSION (ARMC-ANES)
EPIDURAL | Status: DC | PRN
  Administered 2016-09-19: 10 mL/h via EPIDURAL

## 2016-09-19 MED ORDER — ACETAMINOPHEN 325 MG PO TABS: 650.0000 mg | ORAL_TABLET | ORAL | Status: DC | PRN

## 2016-09-19 MED ORDER — PRENATAL MULTIVITAMIN CH
1.0000 | ORAL_TABLET | Freq: Every day | ORAL | Status: DC
  Administered 2016-09-19: 1 via ORAL
  Filled 2016-09-19: qty 1

## 2016-09-19 MED ORDER — TETANUS-DIPHTH-ACELL PERTUSSIS 5-2.5-18.5 LF-MCG/0.5 IM SUSP: 0.5000 mL | Freq: Once | INTRAMUSCULAR | Status: DC

## 2016-09-19 MED ORDER — FLEET ENEMA 7-19 GM/118ML RE ENEM: 1.0000 | ENEMA | Freq: Every day | RECTAL | Status: DC | PRN

## 2016-09-19 MED ORDER — MEASLES, MUMPS & RUBELLA VAC ~~LOC~~ INJ
0.5000 mL | INJECTION | Freq: Once | SUBCUTANEOUS | Status: DC
  Filled 2016-09-19: qty 0.5

## 2016-09-19 MED ORDER — EPHEDRINE 5 MG/ML INJ
10.0000 mg | INTRAVENOUS | Status: DC | PRN
  Filled 2016-09-19: qty 2

## 2016-09-19 MED ORDER — FENTANYL 2.5 MCG/ML W/ROPIVACAINE 0.2% IN NS 100 ML EPIDURAL INFUSION (ARMC-ANES): 10.0000 mL/h | EPIDURAL | Status: DC

## 2016-09-19 MED ORDER — SENNOSIDES-DOCUSATE SODIUM 8.6-50 MG PO TABS
2.0000 | ORAL_TABLET | ORAL | Status: DC
  Administered 2016-09-20: 2 via ORAL
  Filled 2016-09-19: qty 2

## 2016-09-19 MED ORDER — BENZOCAINE-MENTHOL 20-0.5 % EX AERO
1.0000 "application " | INHALATION_SPRAY | CUTANEOUS | Status: DC | PRN
  Administered 2016-09-19: 1 via TOPICAL
  Filled 2016-09-19: qty 56

## 2016-09-19 MED ORDER — PHENYLEPHRINE 40 MCG/ML (10ML) SYRINGE FOR IV PUSH (FOR BLOOD PRESSURE SUPPORT)
80.0000 ug | PREFILLED_SYRINGE | INTRAVENOUS | Status: DC | PRN
  Filled 2016-09-19: qty 5

## 2016-09-19 MED ORDER — SIMETHICONE 80 MG PO CHEW: 80.0000 mg | CHEWABLE_TABLET | ORAL | Status: DC | PRN

## 2016-09-19 MED ORDER — IBUPROFEN 600 MG PO TABS
600.0000 mg | ORAL_TABLET | Freq: Four times a day (QID) | ORAL | Status: DC
  Administered 2016-09-19 – 2016-09-20 (×5): 600 mg via ORAL
  Filled 2016-09-19 (×5): qty 1

## 2016-09-19 MED ORDER — SODIUM CHLORIDE 0.9% FLUSH: 3.0000 mL | INTRAVENOUS | Status: DC | PRN

## 2016-09-19 MED ORDER — SODIUM CHLORIDE 0.9% FLUSH: 3.0000 mL | Freq: Two times a day (BID) | INTRAVENOUS | Status: DC

## 2016-09-19 MED ORDER — DIPHENHYDRAMINE HCL 25 MG PO CAPS: 25.0000 mg | ORAL_CAPSULE | Freq: Four times a day (QID) | ORAL | Status: DC | PRN

## 2016-09-19 MED ORDER — BISACODYL 10 MG RE SUPP: 10.0000 mg | Freq: Every day | RECTAL | Status: DC | PRN

## 2016-09-19 MED ORDER — ONDANSETRON HCL 4 MG/2ML IJ SOLN: 4.0000 mg | INTRAMUSCULAR | Status: DC | PRN

## 2016-09-19 MED ORDER — DIPHENHYDRAMINE HCL 50 MG/ML IJ SOLN: 12.5000 mg | INTRAMUSCULAR | Status: DC | PRN

## 2016-09-19 MED ORDER — COCONUT OIL OIL: 1.0000 "application " | TOPICAL_OIL | Status: DC | PRN

## 2016-09-19 MED ORDER — LIDOCAINE-EPINEPHRINE (PF) 1.5 %-1:200000 IJ SOLN
INTRAMUSCULAR | Status: DC | PRN
  Administered 2016-09-19: 3 mL via EPIDURAL

## 2016-09-19 MED ORDER — SODIUM CHLORIDE 0.9 % IV SOLN: 250.0000 mL | INTRAVENOUS | Status: DC | PRN

## 2016-09-19 MED ORDER — ONDANSETRON HCL 4 MG PO TABS: 4.0000 mg | ORAL_TABLET | ORAL | Status: DC | PRN

## 2016-09-19 MED ORDER — LACTATED RINGERS IV SOLN: 500.0000 mL | Freq: Once | INTRAVENOUS | Status: DC

## 2016-09-19 NOTE — Discharge Summary (Signed)
Obstetrical Discharge Summary  [name] DOB: 24-Nov-1985 MRN: [id number]  Date of Admission: 09/18/2016 Date of Discharge: 09/20/2016  Primary OB: [facility]  Gestational Age at Delivery: [redacted]w[redacted]d   Antepartum complications: Elevated BP in the clinic, GBS positive, history of 19 week loss of twins s/p 17-P, Obesity, Zoster infection in pregnancy with Acyclovir treatment Admitting Diagnosis: gestational HTN Secondary Diagnosis: Patient Active Problem List   Diagnosis Date Noted  . Gestational hypertension 09/18/2016  . Pregnancy 05/07/2016  . First trimester screening 03/19/2016  . Family history of congenital heart defect     Augmentation: Pitocin Complications: None Intrapartum complications/course: 123XX123 G3P1011 at 39+6wks, admitted for iol for gHTN with a 4cm cervix and GBS pos. She progressed quickly on pitocin from 5cm to 6cm in the hour before delivery. She progressed from 6cm to 10 cm with pushing in 10 min. Her membranes did not rupture until the fetal head was emerging. I was called at 0519 and she delivered at 0521. She was attended by the nursing staff and the MD entered the room as cord was being clamped. The placenta delivered spontaneously and was intact.  A 1st deg stellate lac was repaired with 2-0 and 3-0 vicryl and a large clot extracted from the lower uterine segment. The baby was placed promptly on the maternal abdomen and was nursing when I left the room. Date of Delivery: 09/19/16 Delivered By: Rhonda Kindler, MD Delivery Type: spontaneous vaginal delivery Anesthesia: epidural Placenta: Spontaneous Laceration: 1st deg Episiotomy: none Newborn Data: Live born female "Mardelle Matte" Birth Weight: 7 lb 7.9 oz (3400 g) APGAR: 8, 9    Discharge Physical Exam:  BP 112/69   Pulse 66   Temp 97.6 F (36.4 C) (Oral)   Resp 20   Ht 5\' 10"  (1.778 m)   Wt 270 lb (122.5 kg)   LMP 12/15/2015   SpO2 98%   BMI 38.74 kg/m   General:  NAD CV: RRR Pulm: CTABL, nl effort ABD: s/nd/nt, fundus firm and below the umbilicus Lochia: moderate Incision: c/d/i  DVT Evaluation: LE non-ttp, no evidence of DVT on exam.  Hemoglobin  Date Value Ref Range Status  09/20/2016 12.0 12.0 - 16.0 g/dL Final   HGB  Date Value Ref Range Status  08/06/2014 15.1 12.0 - 16.0 g/dL Final   HCT  Date Value Ref Range Status  09/20/2016 34.0 (L) 35.0 - 47.0 % Final  08/06/2014 45.0 35.0 - 47.0 % Final    Post partum course: Uncomplicated Postpartum Procedures: non Disposition: stable, discharge to home. Baby Feeding: breastmilk Baby Disposition: home with mom  Rh Immune globulin given: n/a Rubella vaccine given: n/a Tdap vaccine given in AP or PP setting: 09/02/15 Flu vaccine given in AP or PP setting: up to date  Prenatal Labs:  ABO, Rh:  A Positive Antibody:  Negative Rubella:   Immune Varicella: Immune RPR:   NR HBsAg:   Negative HIV:   Negative GTT: 105 GBS:   POSITIVE    Plan:  Rhonda [name] was discharged to home in good condition. Follow-up appointment at [facility] 6 weeks   Discharge Medications: Allergies as of 09/20/2016   No Known Allergies     Medication List    STOP taking these medications   folic acid 1 MG tablet Commonly known as:  FOLVITE     TAKE these medications   buPROPion 300 MG 24 hr tablet Commonly known as:  WELLBUTRIN XL Take 300 mg by mouth daily.  metFORMIN 500 MG tablet Commonly known as:  GLUCOPHAGE Take 500 mg by mouth 2 (two) times daily with a meal.   prenatal multivitamin Tabs tablet Take 1 tablet by mouth daily at 12 noon.         SignedBenjaman [name]  8:36 AM 09/20/16

## 2016-09-19 NOTE — Anesthesia Procedure Notes (Signed)
Epidural Patient location during procedure: OB Start time: 09/19/2016 2:53 AM End time: 09/19/2016 3:13 AM  Preanesthetic Checklist Completed: patient identified, site marked, surgical consent, pre-op evaluation, timeout performed, IV checked, risks and benefits discussed and monitors and equipment checked  Epidural Patient position: sitting Prep: Betadine Patient monitoring: heart rate, continuous pulse ox and blood pressure Approach: midline Location: L4-L5 Injection technique: LOR saline  Needle:  Needle type: Tuohy  Needle gauge: 17 G Needle length: 9 cm and 9 Needle insertion depth: 8 cm Catheter type: closed end flexible Catheter size: 20 Guage Catheter at skin depth: 12 cm Test dose: negative and 1.5% lidocaine with Epi 1:200 K  Assessment Events: blood not aspirated, injection not painful, no injection resistance, negative IV test and no paresthesia  Additional Notes   Patient tolerated the insertion well without complications.Reason for block:procedure for pain

## 2016-09-19 NOTE — Anesthesia Preprocedure Evaluation (Signed)
Anesthesia Evaluation  Patient identified by MRN, date of birth, ID band Patient awake    Reviewed: Allergy & Precautions, Patient's Chart, lab work & pertinent test results  History of Anesthesia Complications Negative for: history of anesthetic complications  Airway Mallampati: II       Dental   Pulmonary neg pulmonary ROS,           Cardiovascular hypertension,      Neuro/Psych negative neurological ROS     GI/Hepatic negative GI ROS, Neg liver ROS,   Endo/Other  negative endocrine ROS  Renal/GU negative Renal ROS     Musculoskeletal   Abdominal   Peds  Hematology negative hematology ROS (+)   Anesthesia Other Findings   Reproductive/Obstetrics (+) Pregnancy                             Anesthesia Physical Anesthesia Plan  ASA: II  Anesthesia Plan: Epidural   Post-op Pain Management:    Induction:   Airway Management Planned:   Additional Equipment:   Intra-op Plan:   Post-operative Plan:   Informed Consent: I have reviewed the patients History and Physical, chart, labs and discussed the procedure including the risks, benefits and alternatives for the proposed anesthesia with the patient or authorized representative who has indicated his/her understanding and acceptance.     Plan Discussed with:   Anesthesia Plan Comments:         Anesthesia Quick Evaluation

## 2016-09-20 ENCOUNTER — Encounter: Payer: Self-pay | Admitting: General Practice

## 2016-09-20 LAB — CBC
HEMATOCRIT: 34 % — AB (ref 35.0–47.0)
Hemoglobin: 12 g/dL (ref 12.0–16.0)
MCH: 31.4 pg (ref 26.0–34.0)
MCHC: 35.2 g/dL (ref 32.0–36.0)
MCV: 89.3 fL (ref 80.0–100.0)
PLATELETS: 195 10*3/uL (ref 150–440)
RBC: 3.81 MIL/uL (ref 3.80–5.20)
RDW: 13.6 % (ref 11.5–14.5)
WBC: 7.8 10*3/uL (ref 3.6–11.0)

## 2016-09-20 NOTE — Progress Notes (Signed)
Patient discharge to home via wheelchair with spouse and baby in car seat.

## 2016-09-20 NOTE — Anesthesia Postprocedure Evaluation (Signed)
Anesthesia Post Note  Patient: Rhonda [name]  Procedure(s) Performed: * No procedures listed *  Patient location during evaluation: Mother Baby Anesthesia Type: Epidural Level of consciousness: awake and alert Pain management: pain level controlled Vital Signs Assessment: post-procedure vital signs reviewed and stable Respiratory status: spontaneous breathing, nonlabored ventilation and respiratory function stable Cardiovascular status: stable Postop Assessment: no headache, no backache and epidural receding Anesthetic complications: no     Last Vitals:  Vitals:   09/20/16 0024 09/20/16 0447  BP: 130/77 112/69  Pulse: 62 66  Resp: 20 20  Temp: 36.4 C 36.4 C    Last Pain:  Vitals:   09/20/16 0447  TempSrc: Oral  PainSc:                  Alison Stalling

## 2016-10-25 DIAGNOSIS — Z13220 Encounter for screening for lipoid disorders: Secondary | ICD-10-CM | POA: Diagnosis not present

## 2016-10-25 DIAGNOSIS — Z131 Encounter for screening for diabetes mellitus: Secondary | ICD-10-CM | POA: Diagnosis not present

## 2016-10-25 DIAGNOSIS — F329 Major depressive disorder, single episode, unspecified: Secondary | ICD-10-CM | POA: Diagnosis not present

## 2016-10-25 DIAGNOSIS — Z79899 Other long term (current) drug therapy: Secondary | ICD-10-CM | POA: Diagnosis not present

## 2016-10-31 DIAGNOSIS — N760 Acute vaginitis: Secondary | ICD-10-CM | POA: Diagnosis not present

## 2016-11-14 DIAGNOSIS — Z30430 Encounter for insertion of intrauterine contraceptive device: Secondary | ICD-10-CM | POA: Diagnosis not present

## 2016-11-14 DIAGNOSIS — Z3200 Encounter for pregnancy test, result unknown: Secondary | ICD-10-CM | POA: Diagnosis not present

## 2016-12-19 DIAGNOSIS — Z30431 Encounter for routine checking of intrauterine contraceptive device: Secondary | ICD-10-CM | POA: Diagnosis not present

## 2017-01-15 ENCOUNTER — Ambulatory Visit
Admission: EM | Admit: 2017-01-15 | Discharge: 2017-01-15 | Disposition: A | Discharge status: Home / Self Care | Payer: 59 | Attending: Family Medicine | Admitting: Family Medicine

## 2017-01-15 ENCOUNTER — Encounter: Payer: Self-pay | Admitting: *Deleted

## 2017-01-15 DIAGNOSIS — L309 Dermatitis, unspecified: Secondary | ICD-10-CM | POA: Diagnosis not present

## 2017-01-15 DIAGNOSIS — R21 Rash and other nonspecific skin eruption: Secondary | ICD-10-CM | POA: Diagnosis not present

## 2017-01-15 MED ORDER — CLOBETASOL PROPIONATE 0.05 % EX CREA: 1.0000 "application " | TOPICAL_CREAM | Freq: Two times a day (BID) | CUTANEOUS | 0 refills | Status: DC

## 2017-01-15 MED ORDER — CETIRIZINE HCL 10 MG PO TABS: 10.0000 mg | ORAL_TABLET | Freq: Every day | ORAL | 0 refills | Status: DC | PRN

## 2017-01-15 MED ORDER — KETOCONAZOLE 2 % EX CREA: 1.0000 "application " | TOPICAL_CREAM | Freq: Two times a day (BID) | CUTANEOUS | 0 refills | Status: DC

## 2017-01-15 NOTE — ED Provider Notes (Signed)
[facility]    CSN: [id number] Arrival date & time: 01/15/17  1247     History   Chief Complaint Chief Complaint  Patient presents with  . Rash    HPI Rhonda [name] is a 31 y.o. female.   Patient is a 31 year old white female with a rash on her right arm for about 5 days. She's put Tegaderm over it but the area started to itch. She denies any unusual contact to the history of PCO S borderline diabetes which take Coumadin for and for treatment of the PCL. She had gestational hypertension in pregnancy but doesn't have hypertension when she is not pregnant and she's had a cholecystectomy before in the past. She does not smoke and she is a surgical nurse here at the [facility]. Family history positive diabetes she's never smoked.   The history is provided by the patient. No language interpreter was used.  Rash  Location:  Shoulder/arm Shoulder/arm rash location:  R elbow Quality: itchiness, redness and scaling   Severity:  Moderate Onset quality:  Sudden Duration:  5 days Timing:  Constant Progression:  Unable to specify Chronicity:  New Context: not animal contact, not chemical exposure, not diapers, not plant contact and not sick contacts   Relieved by:  Nothing Ineffective treatments:  None tried   Past Medical History:  Diagnosis Date  . Depression    treated  . PCOS (polycystic ovarian syndrome) 2008    Patient Active Problem List   Diagnosis Date Noted  . Gestational hypertension 09/18/2016  . Pregnancy 05/07/2016  . First trimester screening 03/19/2016  . Family history of congenital heart defect     Past Surgical History:  Procedure Laterality Date  . CHOLECYSTECTOMY      OB History    Gravida Para Term Preterm AB Living   3 2 1 1   1    SAB TAB Ectopic Multiple Live Births         1 1      Obstetric Comments    Hx PCO, reports several BV infections       Home Medications    Prior to Admission medications   Medication  Sig Start Date End Date Taking? Authorizing Provider  buPROPion (WELLBUTRIN XL) 300 MG 24 hr tablet Take 300 mg by mouth daily.   Yes [provider]  Prenatal Vit-Fe Fumarate-FA (PRENATAL MULTIVITAMIN) TABS tablet Take 1 tablet by mouth daily at 12 noon.   Yes [provider]  cetirizine (ZYRTEC) 10 MG tablet Take 1 tablet (10 mg total) by mouth daily as needed (puritis). 01/15/17   Frederich Cha, MD  clobetasol cream (TEMOVATE) 6.76 % Apply 1 application topically 2 (two) times daily. 01/15/17   Frederich Cha, MD  ketoconazole (NIZORAL) 2 % cream Apply 1 application topically 2 (two) times daily. Use 5-7 days if the Temovate cream is not successful in eradicating the lesion 01/15/17   Frederich Cha, MD  metFORMIN (GLUCOPHAGE) 500 MG tablet Take 500 mg by mouth 2 (two) times daily with a meal.    [provider]    Family History Family History  Problem Relation Age of Onset  . Diabetes Paternal Grandmother     Social History Social History  Substance Use Topics  . Smoking status: Never Smoker  . Smokeless tobacco: Never Used  . Alcohol use No     Allergies   Patient has no known allergies.   Review of Systems Review of Systems  Constitutional: Negative.  Skin: Positive for rash.  All other systems reviewed and are negative.    Physical Exam Triage Vital Signs ED Triage Vitals  Enc Vitals Group     BP 01/15/17 1325 132/88     Pulse Rate 01/15/17 1325 67     Resp 01/15/17 1325 16     Temp 01/15/17 1325 98.2 F (36.8 C)     Temp Source 01/15/17 1325 Oral     SpO2 01/15/17 1325 98 %     Weight 01/15/17 1327 270 lb (122.5 kg)     Height 01/15/17 1327 5\' 10"  (1.778 m)     Head Circumference --      Peak Flow --      Pain Score 01/15/17 1328 0     Pain Loc --      Pain Edu? --      Excl. in [place]? --    No data found.   Updated Vital Signs BP 132/88 (BP Location: Left Arm)   Pulse 67   Temp 98.2 F (36.8 C) (Oral)   Resp 16   Ht 5\' 10"   (1.778 m)   Wt 270 lb (122.5 kg)   LMP 12/27/2016   SpO2 98%   BMI 38.74 kg/m   Visual Acuity Right Eye Distance:   Left Eye Distance:   Bilateral Distance:    Right Eye Near:   Left Eye Near:    Bilateral Near:     Physical Exam  Constitutional: She is oriented to person, place, and time. She appears well-developed and well-nourished.  HENT:  Head: Normocephalic and atraumatic.  Eyes: EOM are normal. Pupils are equal, round, and reactive to light.  Neck: Normal range of motion. Neck supple.  Pulmonary/Chest: Effort normal.  Musculoskeletal: Normal range of motion.  Neurological: She is alert and oriented to person, place, and time.  Skin: Rash noted.     Patient has cervical rash in the antecubital area of the right elbow. The some redness around the area but she states that from the Tegaderm patch that she had a  Psychiatric: She has a normal mood and affect.  Vitals reviewed.    UC Treatments / Results  Labs (all labs ordered are listed, but only abnormal results are displayed) Labs Reviewed - No data to display  EKG  EKG Interpretation None       Radiology No results found.  Procedures Procedures (including critical care time)  Medications Ordered in UC Medications - No data to display   Initial Impression / Assessment and Plan / UC Course  I have reviewed the triage vital signs and the nursing notes.  Pertinent labs & imaging results that were available during my care of the patient were reviewed by me and considered in my medical decision making (see chart for details).     Scabies either eczema or some type of fungal infection in the right antecubital area was explained to her I'm really suspicious this is early pityriasis rosea. However this point the nerve V days explained to her in 2 weeks if this is pityriasis rosea she is on have a rash all over both anterior and posterior body. For now will treat as a system to update eczema and I will place  her on Temovate cream twice a day that doesn't take care of problem 5 days we'll try Nizoral antifungal cream does not work in bilateral which is see whether this pityriasis rosea or not.  Warned her that secondary syphilis can limit the rash  of psoriasis rosea and is any concern about syphilis she should have blood work drawn.     Final Clinical Impressions(s) / UC Diagnoses   Final diagnoses:  Rash  Rash and nonspecific skin eruption  Eczema, unspecified type    New Prescriptions Discharge Medication List as of 01/15/2017  2:40 PM    START taking these medications   Details  cetirizine (ZYRTEC) 10 MG tablet Take 1 tablet (10 mg total) by mouth daily as needed (puritis)., Starting Tue 01/15/2017, Normal    clobetasol cream (TEMOVATE) 0.34 % Apply 1 application topically 2 (two) times daily., Starting Tue 01/15/2017, Normal    ketoconazole (NIZORAL) 2 % cream Apply 1 application topically 2 (two) times daily. Use 5-7 days if the Temovate cream is not successful in eradicating the lesion, Starting Tue 01/15/2017, Normal        Note: This dictation was prepared with Dragon dictation along with smaller phrase technology. Any transcriptional errors that result from this process are unintentional.   Frederich Cha, MD 01/15/17 [phone]

## 2017-01-15 NOTE — ED Triage Notes (Signed)
Patient noticed a red rash on her right antecubital 1 week ago.

## 2017-04-19 DIAGNOSIS — Z13220 Encounter for screening for lipoid disorders: Secondary | ICD-10-CM | POA: Diagnosis not present

## 2017-04-19 DIAGNOSIS — Z79899 Other long term (current) drug therapy: Secondary | ICD-10-CM | POA: Diagnosis not present

## 2017-04-19 DIAGNOSIS — Z131 Encounter for screening for diabetes mellitus: Secondary | ICD-10-CM | POA: Diagnosis not present

## 2017-04-26 DIAGNOSIS — Z0000 Encounter for general adult medical examination without abnormal findings: Secondary | ICD-10-CM | POA: Diagnosis not present

## 2017-04-26 DIAGNOSIS — F329 Major depressive disorder, single episode, unspecified: Secondary | ICD-10-CM | POA: Diagnosis not present

## 2017-04-26 DIAGNOSIS — Z79899 Other long term (current) drug therapy: Secondary | ICD-10-CM | POA: Diagnosis not present

## 2017-06-24 DIAGNOSIS — R21 Rash and other nonspecific skin eruption: Secondary | ICD-10-CM | POA: Diagnosis not present

## 2017-08-26 DIAGNOSIS — L729 Follicular cyst of the skin and subcutaneous tissue, unspecified: Secondary | ICD-10-CM | POA: Diagnosis not present

## 2017-09-27 DIAGNOSIS — D2272 Melanocytic nevi of left lower limb, including hip: Secondary | ICD-10-CM | POA: Diagnosis not present

## 2017-09-27 DIAGNOSIS — D225 Melanocytic nevi of trunk: Secondary | ICD-10-CM | POA: Diagnosis not present

## 2017-09-27 DIAGNOSIS — D2262 Melanocytic nevi of left upper limb, including shoulder: Secondary | ICD-10-CM | POA: Diagnosis not present

## 2017-09-27 DIAGNOSIS — D2271 Melanocytic nevi of right lower limb, including hip: Secondary | ICD-10-CM | POA: Diagnosis not present

## 2017-12-26 ENCOUNTER — Encounter (INDEPENDENT_AMBULATORY_CARE_PROVIDER_SITE_OTHER): Payer: Self-pay

## 2018-01-07 ENCOUNTER — Ambulatory Visit (INDEPENDENT_AMBULATORY_CARE_PROVIDER_SITE_OTHER): Payer: Self-pay | Admitting: Family Medicine

## 2018-01-08 IMAGING — US US MFM OB COMPLETE +14 WKS
1 series · 14 of 28 positions shown · non-contrast
Comparison: none

[Series 1: us mfm ob complete +14 wks · 0.30mm/px · 14 of 36 slices shown]
[im 2/36]
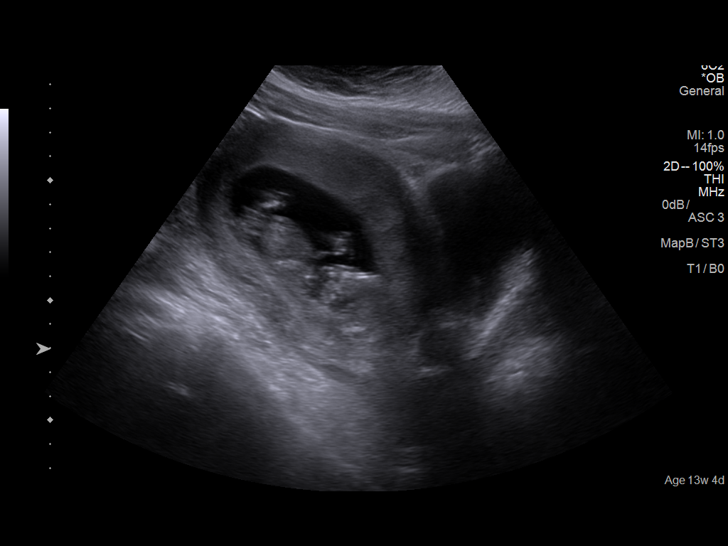
[im 4/36]
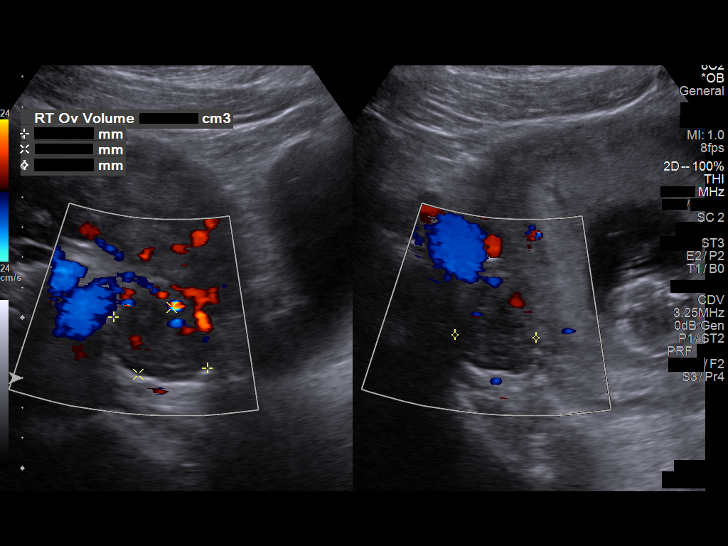
[im 7/36]
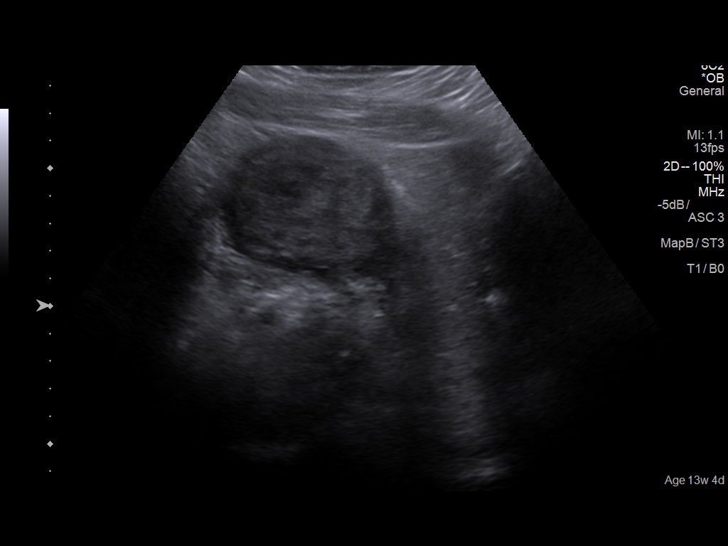
[im 10/36]
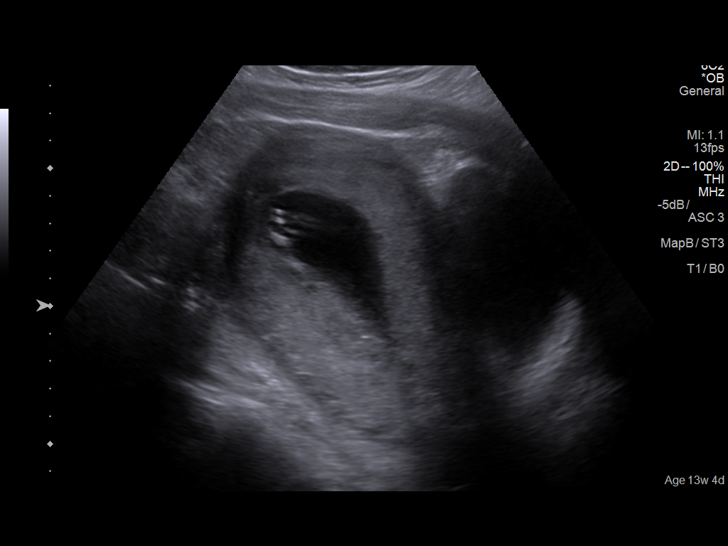
[im 12/36]
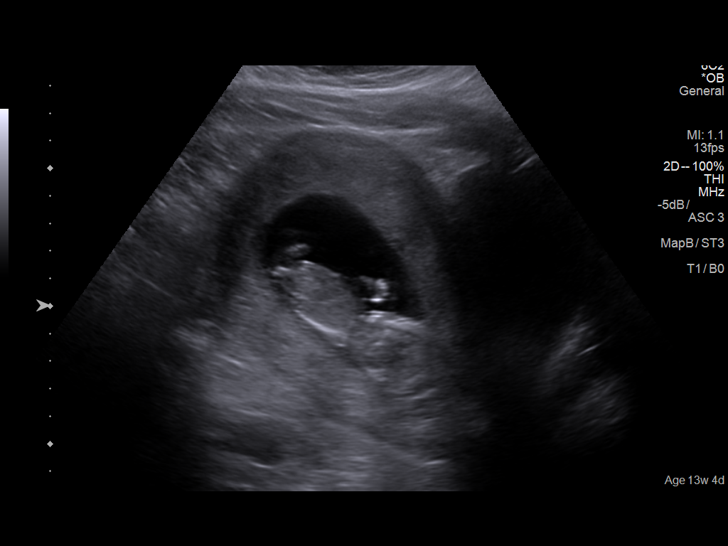
[im 15/36]
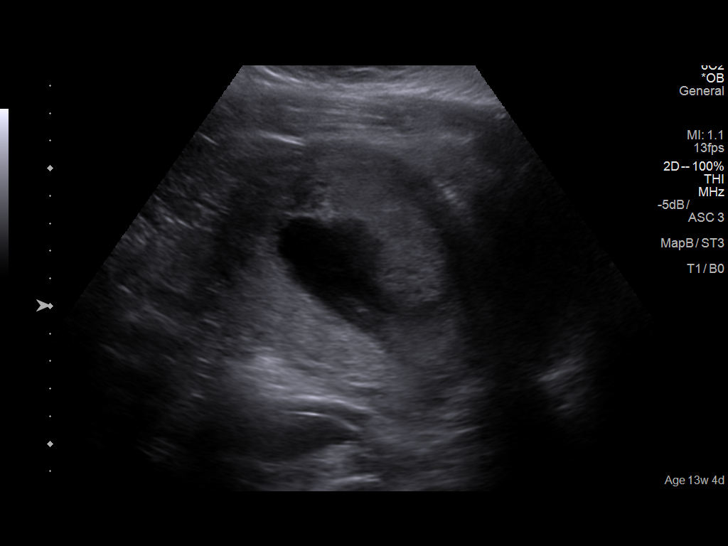
[im 17/36]
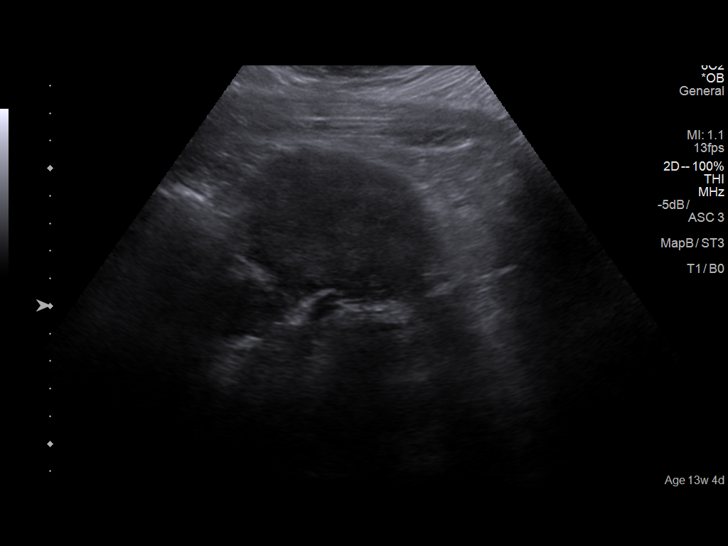
[im 20/36]
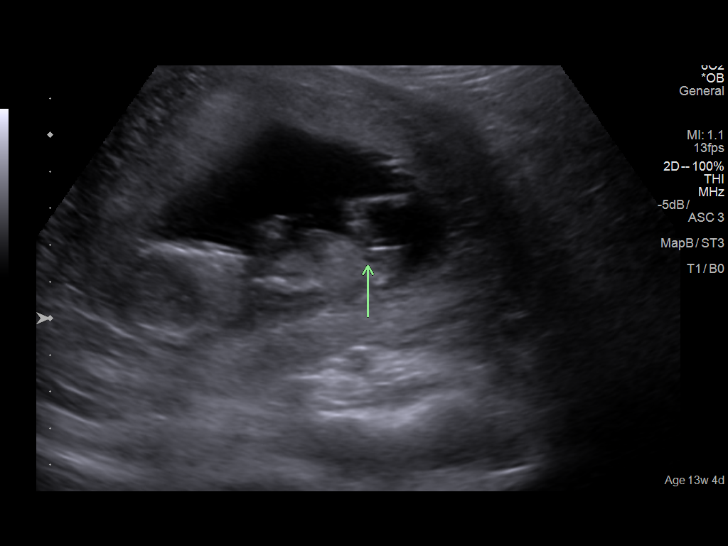
[im 23/36]
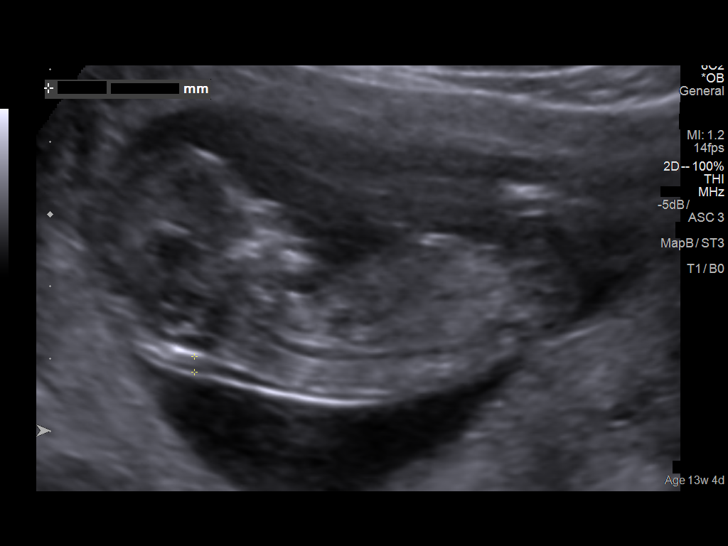
[im 25/36]
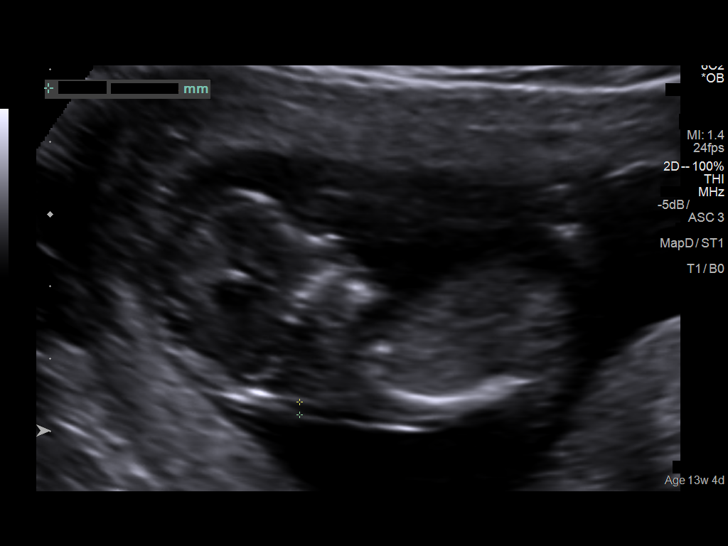
[im 28/36]
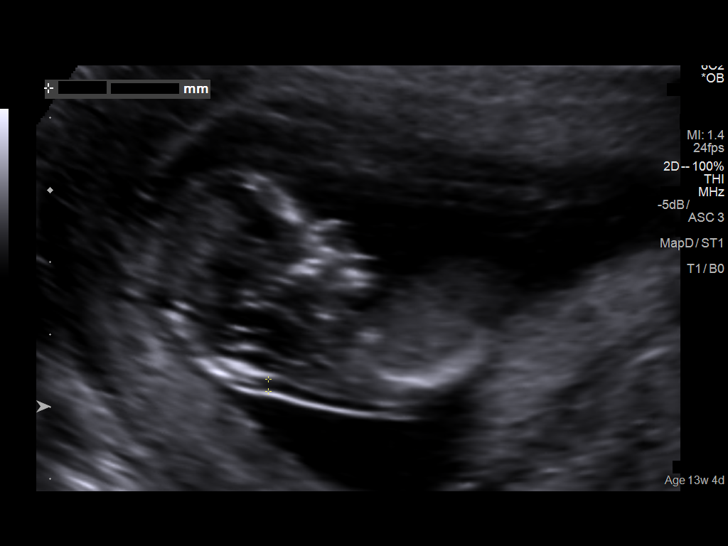
[im 30/36]
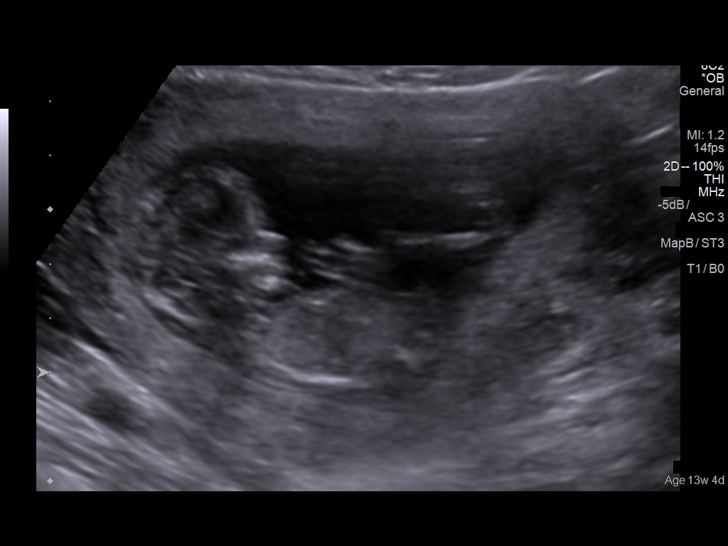
[im 33/36]
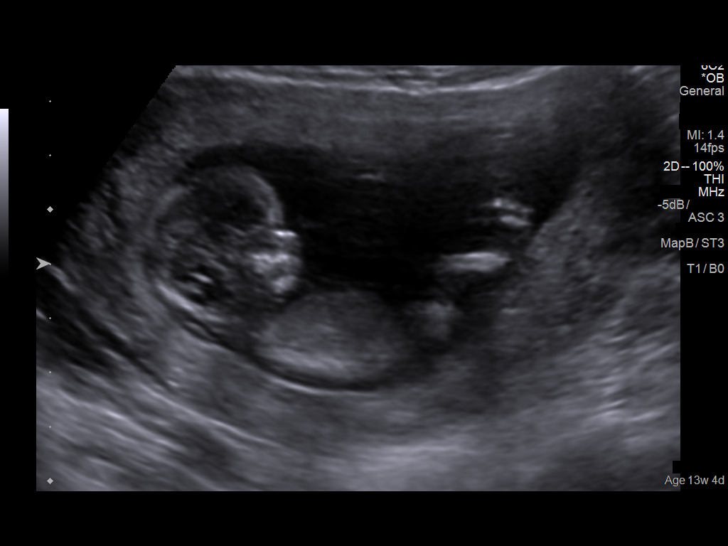
[im 36/36]
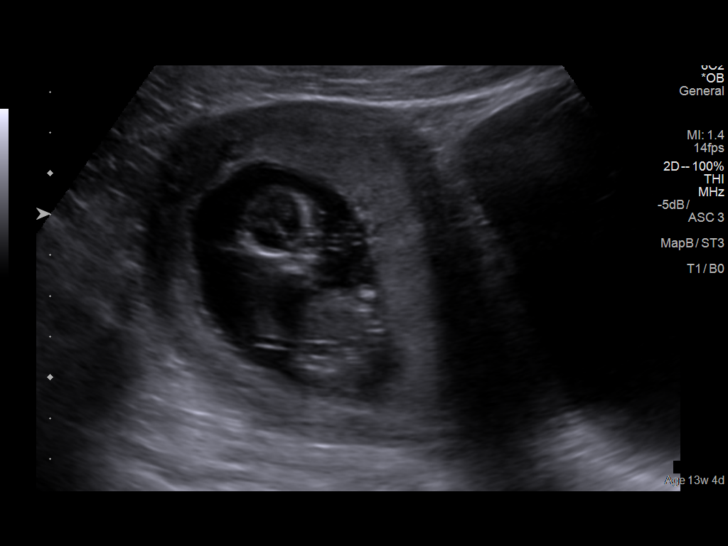

[14 of 28 positions shown; findings below may reference images not displayed]

Canned report from images found in remote index.

Refer to host system for actual result text.

## 2018-01-09 ENCOUNTER — Ambulatory Visit (INDEPENDENT_AMBULATORY_CARE_PROVIDER_SITE_OTHER): Payer: 59 | Admitting: Family Medicine

## 2018-01-09 ENCOUNTER — Encounter (INDEPENDENT_AMBULATORY_CARE_PROVIDER_SITE_OTHER): Payer: Self-pay | Admitting: Family Medicine

## 2018-01-09 VITALS — BP 118/81 | HR 71 | Temp 98.3°F | Ht 70.0 in | Wt 273.0 lb

## 2018-01-09 DIAGNOSIS — Z9189 Other specified personal risk factors, not elsewhere classified: Secondary | ICD-10-CM

## 2018-01-09 DIAGNOSIS — R0602 Shortness of breath: Secondary | ICD-10-CM | POA: Diagnosis not present

## 2018-01-09 DIAGNOSIS — E282 Polycystic ovarian syndrome: Secondary | ICD-10-CM

## 2018-01-09 DIAGNOSIS — Z6839 Body mass index (BMI) 39.0-39.9, adult: Secondary | ICD-10-CM

## 2018-01-09 DIAGNOSIS — Z0289 Encounter for other administrative examinations: Secondary | ICD-10-CM

## 2018-01-09 DIAGNOSIS — R5383 Other fatigue: Secondary | ICD-10-CM

## 2018-01-09 DIAGNOSIS — Z1331 Encounter for screening for depression: Secondary | ICD-10-CM | POA: Diagnosis not present

## 2018-01-09 NOTE — Progress Notes (Signed)
.  Office: [phone]  /  Fax: [phone]   HPI:   Chief Complaint: OBESITY  Rhonda [name] (MR# [id number]) is a 32 y.o. female who presents on 01/09/2018 for obesity evaluation and treatment. Current BMI is Body mass index is 39.17 kg/m.Marland Kitchen Rhonda [name] has struggled with obesity for years and has been unsuccessful in either losing weight or maintaining long term weight loss. Rhonda [name] found out about our clinic through [facility]. Rhonda [name] attended our information session and states she is currently in the action stage of change and ready to dedicate time achieving and maintaining a healthier weight.  Rhonda [name] states her family eats meals together she thinks her family will eat healthier with  her her desired weight loss is 78 lbs she has been heavy most of  her life she started gaining weight in college her heaviest weight ever was 280 lbs. she has significant food cravings issues  she is frequently drinking liquids with calories she frequently makes poor food choices she has problems with excessive hunger  she frequently eats larger portions than normal  she has binge eating behaviors she struggles with emotional eating    Fatigue Rhonda [name] feels her energy is lower than it should be. This has worsened with weight gain and has not worsened recently. Rhonda [name] admits to daytime somnolence and admits to waking up still tired. Patient is at risk for obstructive sleep apnea. Patent has a history of symptoms of daytime fatigue and morning fatigue. Patient generally gets 5 or 6 hours of sleep per night, and states they generally have restless sleep. Snoring is not present. Apneic episodes are not present. Epworth Sleepiness Score is 3  EKG was ordered today and shows normal sinus rhythm.  Dyspnea on exertion [name] notes increasing shortness of breath with exercising and seems to be worsening over time with weight gain. She notes getting out of breath sooner with activity than she used to.  This has not gotten worse recently. EKG was ordered today and shows normal sinus rhythm. Rhonda [name] denies orthopnea.  PCOS (polycystic ovarian syndrome) Rhonda [name] has a diagnosis of polycystic ovarian syndrome and was previously on metformin.  At risk for diabetes Rhonda [name] is at higher than average risk for developing diabetes due to her obesity and PCOS. She currently denies polyuria or polydipsia.  Depression Screen Rhonda [name]'s Food and Mood (modified PHQ-9) score was  Depression screen PHQ 2/9 01/09/2018  Decreased Interest 2  Down, Depressed, Hopeless 1  PHQ - 2 Score 3  Altered sleeping 1  Tired, decreased energy 3  Change in appetite 1  Feeling bad or failure about yourself  2  Trouble concentrating 2  Moving slowly or fidgety/restless 1  Suicidal thoughts 0  PHQ-9 Score 13  Difficult doing work/chores Somewhat difficult    ALLERGIES: No Known Allergies  MEDICATIONS: Current Outpatient Medications on File Prior to Visit  Medication Sig Dispense Refill  . buPROPion (WELLBUTRIN XL) 300 MG 24 hr tablet Take 300 mg by mouth daily.    Marland Kitchen ELDERBERRY PO Take by mouth.    . levonorgestrel (MIRENA) 20 MCG/24HR IUD 1 each by Intrauterine route once.    . Prenatal Vit-Fe Fumarate-FA (PRENATAL MULTIVITAMIN) TABS tablet Take 1 tablet by mouth daily at 12 noon.    Marland Kitchen spironolactone (ALDACTONE) 25 MG tablet Take 25 mg by mouth daily.     No current facility-administered medications on file prior to visit.     PAST MEDICAL HISTORY: Past Medical History:  Diagnosis Date  . Depression  treated  . PCOS (polycystic ovarian syndrome) 2008  . Plantar fasciitis     PAST SURGICAL HISTORY: Past Surgical History:  Procedure Laterality Date  . CHOLECYSTECTOMY      SOCIAL HISTORY: Social History   Tobacco Use  . Smoking status: Never Smoker  . Smokeless tobacco: Never Used  Substance Use Topics  . Alcohol use: No  . Drug use: No    FAMILY HISTORY: Family History  Problem  Relation Age of Onset  . High blood pressure Mother   . Depression Father   . Liver disease Father   . Alcoholism Father   . Diabetes Paternal Grandmother     ROS: Review of Systems  Constitutional: Positive for malaise/fatigue.  Respiratory: Positive for shortness of breath (with activity).   Cardiovascular: Negative for orthopnea.  Genitourinary: Negative for frequency.  Musculoskeletal:       Muscle or Joint Pain  Endo/Heme/Allergies: Negative for polydipsia.       Hot or Cold Intolerance    PHYSICAL EXAM: Blood pressure 118/81, pulse 71, temperature 98.3 F (36.8 C), temperature source Oral, height 5\' 10"  (1.778 m), weight 273 lb (123.8 kg), SpO2 98 %, unknown if currently breastfeeding. Body mass index is 39.17 kg/m. Physical Exam  Constitutional: She is oriented to person, place, and time. She appears well-developed and well-nourished.  HENT:  Head: Normocephalic and atraumatic.  Nose: Nose normal.  Eyes: EOM are normal. No scleral icterus.  Neck: Normal range of motion. Neck supple. No thyromegaly present.  Cardiovascular: Normal rate.  Murmur (crescendo) heard.  Systolic murmur is present with a grade of 1/6. Pulmonary/Chest: Effort normal. No respiratory distress.  Abdominal: Soft. There is no tenderness.  + obesity  Musculoskeletal: Normal range of motion.  Range of Motion normal in all 4 extremities  Neurological: She is alert and oriented to person, place, and time. Coordination normal.  Skin: Skin is warm and dry.  Psychiatric: She has a normal mood and affect. Her behavior is normal.  Vitals reviewed.   RECENT LABS AND TESTS: BMET    Component Value Date/Time   NA 132 (L) 09/18/2016 1804   NA 142 08/06/2014 0215   K 3.6 09/18/2016 1804   K 3.5 08/06/2014 0215   CL 104 09/18/2016 1804   CL 108 (H) 08/06/2014 0215   CO2 19 (L) 09/18/2016 1804   CO2 20 (L) 08/06/2014 0215   GLUCOSE 74 09/18/2016 1804   GLUCOSE 113 (H) 08/06/2014 0215   BUN 9  09/18/2016 1804   BUN 15 08/06/2014 0215   CREATININE 0.61 09/18/2016 1804   CREATININE 1.00 08/06/2014 0215   CALCIUM 8.7 (L) 09/18/2016 1804   CALCIUM 9.3 08/06/2014 0215   GFRNONAA >60 09/18/2016 1804   GFRNONAA >60 08/06/2014 0215   GFRAA >60 09/18/2016 1804   GFRAA >60 08/06/2014 0215   No results found for: HGBA1C No results found for: INSULIN CBC    Component Value Date/Time   WBC 7.8 09/20/2016 0621   RBC 3.81 09/20/2016 0621   HGB 12.0 09/20/2016 0621   HGB 15.1 08/06/2014 0215   HCT 34.0 (L) 09/20/2016 0621   HCT 45.0 08/06/2014 0215   PLT 195 09/20/2016 0621   PLT 300 08/06/2014 0215   MCV 89.3 09/20/2016 0621   MCV 92 08/06/2014 0215   MCH 31.4 09/20/2016 0621   MCHC 35.2 09/20/2016 0621   RDW 13.6 09/20/2016 0621   RDW 13.3 08/06/2014 0215   LYMPHSABS 0.4 (L) 08/06/2014 0215   MONOABS 0.7 08/06/2014  0215   EOSABS 0.1 08/06/2014 0215   BASOSABS 0.2 (H) 08/06/2014 0215   Iron/TIBC/Ferritin/ %Sat No results found for: IRON, TIBC, FERRITIN, IRONPCTSAT Lipid Panel  No results found for: CHOL, TRIG, HDL, CHOLHDL, VLDL, LDLCALC, LDLDIRECT Hepatic Function Panel     Component Value Date/Time   PROT 7.0 09/18/2016 1804   PROT 8.5 (H) 08/06/2014 0215   ALBUMIN 3.4 (L) 09/18/2016 1804   ALBUMIN 4.3 08/06/2014 0215   AST 22 09/18/2016 1804   AST 33 08/06/2014 0215   ALT 16 09/18/2016 1804   ALT 58 08/06/2014 0215   ALKPHOS 166 (H) 09/18/2016 1804   ALKPHOS 115 08/06/2014 0215   BILITOT 0.6 09/18/2016 1804   BILITOT 0.7 08/06/2014 0215   No results found for: TSH Vitamin D There are no recent lab results  ECG  shows NSR with a rate of 75 BPM INDIRECT CALORIMETER done today shows a VO2 of 171 and a REE of 1191. Her calculated basal metabolic rate is 5009 thus her basal metabolic rate is worse than expected.    ASSESSMENT AND PLAN: Other fatigue - Plan: EKG 12-Lead, CBC With Differential, Comprehensive metabolic panel, Hemoglobin A1c, Insulin, random,  Lipid Panel With LDL/HDL Ratio, VITAMIN D 25 Hydroxy (Vit-D Deficiency, Fractures), Vitamin B12, Folate, T3, T4, free, TSH  Shortness of breath on exertion  PCOS (polycystic ovarian syndrome)  Depression screening  At risk for diabetes mellitus  Class 2 severe obesity with serious comorbidity and body mass index (BMI) of 39.0 to 39.9 in adult, unspecified obesity type (HCC)  PLAN:  Fatigue Kaula was informed that her fatigue may be related to obesity, depression or many other causes. Labs will be ordered, and in the meanwhile Aly has agreed to work on diet, exercise and weight loss to help with fatigue. Proper sleep hygiene was discussed including the need for 7-8 hours of quality sleep each night. A sleep study was not ordered based on symptoms and Epworth score. We will order indirect calorimetry today.  Dyspnea on exertion Elwyn's shortness of breath appears to be obesity related and exercise induced. She has agreed to work on weight loss and gradually increase exercise to treat her exercise induced shortness of breath. If Eulalie follows our instructions and loses weight without improvement of her shortness of breath, we will plan to refer to pulmonology. We will order labs and indirect calorimetry today.We will monitor this condition regularly. Lanna agrees to this plan.  PCOS (polycystic ovarian syndrome) We will check insulin level today and Shawndrea will follow up at the agreed upon time.  Diabetes risk counseling Helene was given extended (15 minutes) diabetes prevention counseling today. She is 32 y.o. female and has risk factors for diabetes including obesity and PCOS. We discussed intensive lifestyle modifications today with an emphasis on weight loss as well as increasing exercise and decreasing simple carbohydrates in her diet.  Depression Screen Belva had a moderately positive depression screening. Depression is commonly associated with obesity and often results  in emotional eating behaviors. We will monitor this closely and work on CBT to help improve the non-hunger eating patterns. Referral to Psychology may be required if no improvement is seen as she continues in our clinic.  Obesity Reagyn is currently in the action stage of change and her goal is to continue with weight loss efforts She has agreed to follow the Category 2 plan Leeandra has been instructed to work up to a goal of 150 minutes of combined cardio and strengthening exercise per  week for weight loss and overall health benefits. We discussed the following Behavioral Modification Strategies today: planning for success, increasing lean protein intake, decrease eating out, work on meal planning and easy cooking plans and ways to avoid night time snacking  Yicel has agreed to follow up with our clinic in 2 weeks. She was informed of the importance of frequent follow up visits to maximize her success with intensive lifestyle modifications for her multiple health conditions. She was informed we would discuss her lab results at her next visit unless there is a critical issue that needs to be addressed sooner. Calyn agreed to keep her next visit at the agreed upon time to discuss these results.    OBESITY BEHAVIORAL INTERVENTION VISIT  Today's visit was # 1 out of 22.  Starting weight: 273 lbs Starting date: 01/09/18 Today's weight : 273 lbs  Today's date: 01/09/2018 Total lbs lost to date: 0 (Patients must lose 7 lbs in the first 6 months to continue with counseling)   ASK: We discussed the diagnosis of obesity with Karma Greaser today and Rochel agreed to give [name] permission to discuss obesity behavioral modification therapy today.  ASSESS: Tailey has the diagnosis of obesity and her BMI today is 39.17 [place] is in the action stage of change   ADVISE: Averill was educated on the multiple health risks of obesity as well as the benefit of weight loss to improve her health.  She was advised of the need for long term treatment and the importance of lifestyle modifications.  AGREE: Multiple dietary modification options and treatment options were discussed and  Dionisia agreed to the above obesity treatment plan.   I, Doreene Nest, am acting as transcriptionist for Eber Jones MD   I have reviewed the above documentation for accuracy and completeness, and I agree with the above. - Ilene Qua, MD

## 2018-01-10 LAB — CBC WITH DIFFERENTIAL
BASOS ABS: 0 10*3/uL (ref 0.0–0.2)
Basos: 0 %
EOS (ABSOLUTE): 0.1 10*3/uL (ref 0.0–0.4)
Eos: 1 %
HEMOGLOBIN: 14.5 g/dL (ref 11.1–15.9)
Hematocrit: 42.1 % (ref 34.0–46.6)
IMMATURE GRANS (ABS): 0 10*3/uL (ref 0.0–0.1)
IMMATURE GRANULOCYTES: 0 %
LYMPHS ABS: 1.3 10*3/uL (ref 0.7–3.1)
LYMPHS: 28 %
MCH: 30.5 pg (ref 26.6–33.0)
MCHC: 34.4 g/dL (ref 31.5–35.7)
MCV: 89 fL (ref 79–97)
MONOCYTES: 8 %
Monocytes Absolute: 0.4 10*3/uL (ref 0.1–0.9)
Neutrophils Absolute: 3 10*3/uL (ref 1.4–7.0)
Neutrophils: 63 %
RBC: 4.75 x10E6/uL (ref 3.77–5.28)
RDW: 13.7 % (ref 12.3–15.4)
WBC: 4.7 10*3/uL (ref 3.4–10.8)

## 2018-01-10 LAB — COMPREHENSIVE METABOLIC PANEL
ALBUMIN: 4.9 g/dL (ref 3.5–5.5)
ALK PHOS: 102 IU/L (ref 39–117)
ALT: 55 IU/L — AB (ref 0–32)
AST: 33 IU/L (ref 0–40)
Albumin/Globulin Ratio: 2 (ref 1.2–2.2)
BUN / CREAT RATIO: 12 (ref 9–23)
BUN: 11 mg/dL (ref 6–20)
Bilirubin Total: 0.5 mg/dL (ref 0.0–1.2)
CO2: 23 mmol/L (ref 20–29)
CREATININE: 0.89 mg/dL (ref 0.57–1.00)
Calcium: 9.2 mg/dL (ref 8.7–10.2)
Chloride: 104 mmol/L (ref 96–106)
GFR calc Af Amer: 99 mL/min/{1.73_m2} (ref >59)
GFR calc non Af Amer: 86 mL/min/{1.73_m2} (ref >59)
GLUCOSE: 84 mg/dL (ref 65–99)
Globulin, Total: 2.4 g/dL (ref 1.5–4.5)
Potassium: 4.5 mmol/L (ref 3.5–5.2)
Sodium: 139 mmol/L (ref 134–144)
Total Protein: 7.3 g/dL (ref 6.0–8.5)

## 2018-01-10 LAB — TSH: TSH: 1.47 u[IU]/mL (ref 0.450–4.500)

## 2018-01-10 LAB — VITAMIN D 25 HYDROXY (VIT D DEFICIENCY, FRACTURES): Vit D, 25-Hydroxy: 25.2 ng/mL — ABNORMAL LOW (ref 30.0–100.0)

## 2018-01-10 LAB — T4, FREE: FREE T4: 1.03 ng/dL (ref 0.82–1.77)

## 2018-01-10 LAB — LIPID PANEL WITH LDL/HDL RATIO
CHOLESTEROL TOTAL: 153 mg/dL (ref 100–199)
HDL: 43 mg/dL (ref >39)
LDL Calculated: 97 mg/dL (ref 0–99)
LDl/HDL Ratio: 2.3 ratio (ref 0.0–3.2)
TRIGLYCERIDES: 63 mg/dL (ref 0–149)
VLDL Cholesterol Cal: 13 mg/dL (ref 5–40)

## 2018-01-10 LAB — HEMOGLOBIN A1C
ESTIMATED AVERAGE GLUCOSE: 103 mg/dL
HEMOGLOBIN A1C: 5.2 % (ref 4.8–5.6)

## 2018-01-10 LAB — T3: T3 TOTAL: 121 ng/dL (ref 71–180)

## 2018-01-10 LAB — FOLATE: FOLATE: 19.1 ng/mL (ref >3.0)

## 2018-01-10 LAB — VITAMIN B12: Vitamin B-12: 452 pg/mL (ref 232–1245)

## 2018-01-10 LAB — INSULIN, RANDOM: INSULIN: 16.5 u[IU]/mL (ref 2.6–24.9)

## 2018-01-16 ENCOUNTER — Encounter (INDEPENDENT_AMBULATORY_CARE_PROVIDER_SITE_OTHER): Payer: Self-pay | Admitting: Family Medicine

## 2018-01-17 DIAGNOSIS — E282 Polycystic ovarian syndrome: Secondary | ICD-10-CM | POA: Diagnosis not present

## 2018-01-17 DIAGNOSIS — F329 Major depressive disorder, single episode, unspecified: Secondary | ICD-10-CM | POA: Diagnosis not present

## 2018-01-17 DIAGNOSIS — L680 Hirsutism: Secondary | ICD-10-CM | POA: Diagnosis not present

## 2018-01-28 ENCOUNTER — Encounter (INDEPENDENT_AMBULATORY_CARE_PROVIDER_SITE_OTHER): Payer: Self-pay | Admitting: Family Medicine

## 2018-01-28 ENCOUNTER — Ambulatory Visit (INDEPENDENT_AMBULATORY_CARE_PROVIDER_SITE_OTHER): Payer: 59 | Admitting: Family Medicine

## 2018-01-28 VITALS — BP 122/84 | HR 104 | Temp 98.3°F | Ht 70.0 in | Wt 265.0 lb

## 2018-01-28 DIAGNOSIS — Z6838 Body mass index (BMI) 38.0-38.9, adult: Secondary | ICD-10-CM | POA: Diagnosis not present

## 2018-01-28 DIAGNOSIS — E8881 Metabolic syndrome: Secondary | ICD-10-CM | POA: Diagnosis not present

## 2018-01-28 DIAGNOSIS — Z9189 Other specified personal risk factors, not elsewhere classified: Secondary | ICD-10-CM | POA: Diagnosis not present

## 2018-01-28 DIAGNOSIS — E559 Vitamin D deficiency, unspecified: Secondary | ICD-10-CM | POA: Diagnosis not present

## 2018-01-28 DIAGNOSIS — E88819 Insulin resistance, unspecified: Secondary | ICD-10-CM

## 2018-01-28 DIAGNOSIS — E66812 Obesity, class 2: Secondary | ICD-10-CM

## 2018-01-28 MED ORDER — VITAMIN D (ERGOCALCIFEROL) 1.25 MG (50000 UNIT) PO CAPS: 50000.0000 [IU] | ORAL_CAPSULE | ORAL | 0 refills | Status: DC

## 2018-01-28 NOTE — Progress Notes (Signed)
Office: [phone]  /  Fax: [phone]   HPI:   Chief Complaint: OBESITY Reine is here to discuss her progress with her obesity treatment plan. She is on the Category 2 plan and is following her eating plan approximately 75 % of the time. She states she is exercising 0 minutes 0 times per week. Shamara struggled with snacks. She was hungry between breakfast and lunch. She found she would be hungry between lunch and dinner. Meron is satisfied at dinner portion. Her weight is 265 lb (120.2 kg) today and has had a weight loss of 8 pounds over a period of 2 to 3 weeks since her last visit. She has lost 8 lbs since starting treatment with [name].  Vitamin D deficiency Carrianne has a diagnosis of vitamin D deficiency. She is currently taking OTC multi vitamin. She denies nausea, vomiting or muscle weakness.  Insulin Resistance Cherysh has a diagnosis of insulin resistance based on her elevated fasting insulin level of 16.5. Jani has a history of PCOS. Although Marisela's blood glucose readings are still under good control, insulin resistance puts her at greater risk of metabolic syndrome and diabetes. She is not taking metformin currently and continues to work on diet and exercise to decrease risk of diabetes.  At risk for diabetes Kassadi is at higher than average risk for developing diabetes due to her obesity and insulin resistance. She currently denies polyuria or polydipsia.  ALLERGIES: No Known Allergies  MEDICATIONS: Current Outpatient Medications on File Prior to Visit  Medication Sig Dispense Refill  . buPROPion (WELLBUTRIN XL) 300 MG 24 hr tablet Take 300 mg by mouth daily.    Marland Kitchen ELDERBERRY PO Take by mouth.    . levonorgestrel (MIRENA) 20 MCG/24HR IUD 1 each by Intrauterine route once.    . Prenatal Vit-Fe Fumarate-FA (PRENATAL MULTIVITAMIN) TABS tablet Take 1 tablet by mouth daily at 12 noon.    Marland Kitchen spironolactone (ALDACTONE) 25 MG tablet Take 25 mg by mouth daily.     No  current facility-administered medications on file prior to visit.     PAST MEDICAL HISTORY: Past Medical History:  Diagnosis Date  . Depression    treated  . PCOS (polycystic ovarian syndrome) 2008  . Plantar fasciitis     PAST SURGICAL HISTORY: Past Surgical History:  Procedure Laterality Date  . CHOLECYSTECTOMY      SOCIAL HISTORY: Social History   Tobacco Use  . Smoking status: Never Smoker  . Smokeless tobacco: Never Used  Substance Use Topics  . Alcohol use: No  . Drug use: No    FAMILY HISTORY: Family History  Problem Relation Age of Onset  . High blood pressure Mother   . Depression Father   . Liver disease Father   . Alcoholism Father   . Diabetes Paternal Grandmother     ROS: Review of Systems  Constitutional: Positive for weight loss.  Gastrointestinal: Negative for nausea and vomiting.  Genitourinary: Negative for frequency.  Musculoskeletal:       Negative for muscle weakness  Endo/Heme/Allergies: Negative for polydipsia.    PHYSICAL EXAM: Blood pressure 122/84, pulse (!) 104, temperature 98.3 F (36.8 C), temperature source Oral, height 5\' 10"  (1.778 m), weight 265 lb (120.2 kg), SpO2 97 %, unknown if currently breastfeeding. Body mass index is 38.02 kg/m. Physical Exam  Constitutional: She is oriented to person, place, and time. She appears well-developed and well-nourished.  Cardiovascular: Normal rate.  Pulmonary/Chest: Effort normal.  Musculoskeletal: Normal range of motion.  Neurological: She  is oriented to person, place, and time.  Skin: Skin is warm and dry.  Psychiatric: She has a normal mood and affect. Her behavior is normal.  Vitals reviewed.   RECENT LABS AND TESTS: BMET    Component Value Date/Time   NA 139 01/09/2018 1116   NA 142 08/06/2014 0215   K 4.5 01/09/2018 1116   K 3.5 08/06/2014 0215   CL 104 01/09/2018 1116   CL 108 (H) 08/06/2014 0215   CO2 23 01/09/2018 1116   CO2 20 (L) 08/06/2014 0215   GLUCOSE 84  01/09/2018 1116   GLUCOSE 74 09/18/2016 1804   GLUCOSE 113 (H) 08/06/2014 0215   BUN 11 01/09/2018 1116   BUN 15 08/06/2014 0215   CREATININE 0.89 01/09/2018 1116   CREATININE 1.00 08/06/2014 0215   CALCIUM 9.2 01/09/2018 1116   CALCIUM 9.3 08/06/2014 0215   GFRNONAA 86 01/09/2018 1116   GFRNONAA >60 08/06/2014 0215   GFRAA 99 01/09/2018 1116   GFRAA >60 08/06/2014 0215   Lab Results  Component Value Date   HGBA1C 5.2 01/09/2018   Lab Results  Component Value Date   INSULIN 16.5 01/09/2018   CBC    Component Value Date/Time   WBC 4.7 01/09/2018 1116   WBC 7.8 09/20/2016 0621   RBC 4.75 01/09/2018 1116   RBC 3.81 09/20/2016 0621   HGB 14.5 01/09/2018 1116   HCT 42.1 01/09/2018 1116   PLT 195 09/20/2016 0621   PLT 300 08/06/2014 0215   MCV 89 01/09/2018 1116   MCV 92 08/06/2014 0215   MCH 30.5 01/09/2018 1116   MCH 31.4 09/20/2016 0621   MCHC 34.4 01/09/2018 1116   MCHC 35.2 09/20/2016 0621   RDW 13.7 01/09/2018 1116   RDW 13.3 08/06/2014 0215   LYMPHSABS 1.3 01/09/2018 1116   LYMPHSABS 0.4 (L) 08/06/2014 0215   MONOABS 0.7 08/06/2014 0215   EOSABS 0.1 01/09/2018 1116   EOSABS 0.1 08/06/2014 0215   BASOSABS 0.0 01/09/2018 1116   BASOSABS 0.2 (H) 08/06/2014 0215   Iron/TIBC/Ferritin/ %Sat No results found for: IRON, TIBC, FERRITIN, IRONPCTSAT Lipid Panel     Component Value Date/Time   CHOL 153 01/09/2018 1116   TRIG 63 01/09/2018 1116   HDL 43 01/09/2018 1116   LDLCALC 97 01/09/2018 1116   Hepatic Function Panel     Component Value Date/Time   PROT 7.3 01/09/2018 1116   PROT 8.5 (H) 08/06/2014 0215   ALBUMIN 4.9 01/09/2018 1116   ALBUMIN 4.3 08/06/2014 0215   AST 33 01/09/2018 1116   AST 33 08/06/2014 0215   ALT 55 (H) 01/09/2018 1116   ALT 58 08/06/2014 0215   ALKPHOS 102 01/09/2018 1116   ALKPHOS 115 08/06/2014 0215   BILITOT 0.5 01/09/2018 1116   BILITOT 0.7 08/06/2014 0215      Component Value Date/Time   TSH 1.470 01/09/2018 1116    Results for KAITELYN, JAMISON (MRN [id number]) as of 01/28/2018 16:44  Ref. Range 01/09/2018 11:16  Vitamin D, 25-Hydroxy Latest Ref Range: 30.0 - 100.0 ng/mL 25.2 (L)   ASSESSMENT AND PLAN: Vitamin D deficiency - Plan: Vitamin D, Ergocalciferol, (DRISDOL) 50000 units CAPS capsule  Insulin resistance  At risk for diabetes mellitus  Class 2 severe obesity with serious comorbidity and body mass index (BMI) of 38.0 to 38.9 in adult, unspecified obesity type ([place])  PLAN:  Vitamin D Deficiency Annell was informed that low vitamin D levels contributes to fatigue and are associated with obesity, breast, and colon  cancer. She agrees to start prescription Vit D @50 ,000 IU every week #4 with no refills. We will recheck labs in 3 months and she will follow up for routine testing of vitamin D, at least 2-3 times per year. She was informed of the risk of over-replacement of vitamin D and agrees to not increase her dose unless she discusses this with [name] first. Loralei agrees to follow up as directed.  Insulin Resistance Sharona will continue to work on weight loss, exercise, and decreasing simple carbohydrates in her diet to help decrease the risk of diabetes. She was informed that eating too many simple carbohydrates or too many calories at one sitting increases the likelihood of GI side effects. Chinenye will continue with the category 2 meal plan. We will repeat fasting insulin and Hgb A1c in 3 months. Tonianne agreed to follow up with [name] as directed to monitor her progress.  Diabetes risk counseling Jaclyne was given extended (15 minutes) diabetes prevention counseling today. She is 32 y.o. female and has risk factors for diabetes including obesity and insulin resistance. We discussed intensive lifestyle modifications today with an emphasis on weight loss as well as increasing exercise and decreasing simple carbohydrates in her diet.  Obesity Leasa is currently in the action stage of change. As  such, her goal is to continue with weight loss efforts She has agreed to follow the Category 2 plan with added cottage cheese or greek yogurt and 100 calorie snack Melayah has been instructed to work up to a goal of 150 minutes of combined cardio and strengthening exercise per week for weight loss and overall health benefits. We discussed the following Behavioral Modification Strategies today: better snacking choices, planning for success, increasing lean protein intake, increasing vegetables and work on meal planning and easy cooking plans  Nekayla has agreed to follow up with our clinic in 2 weeks. She was informed of the importance of frequent follow up visits to maximize her success with intensive lifestyle modifications for her multiple health conditions.   OBESITY BEHAVIORAL INTERVENTION VISIT  Today's visit was # 2 out of 22.  Starting weight: 273 lbs Starting date: 01/09/18 Today's weight : 265 lbs  Today's date: 01/28/2018 Total lbs lost to date: 8 (Patients must lose 7 lbs in the first 6 months to continue with counseling)   ASK: We discussed the diagnosis of obesity with Karma Greaser today and Breeanna agreed to give [name] permission to discuss obesity behavioral modification therapy today.  ASSESS: Lindalou has the diagnosis of obesity and her BMI today is 38.02 Merrillyn is in the action stage of change   ADVISE: [place] was educated on the multiple health risks of obesity as well as the benefit of weight loss to improve her health. She was advised of the need for long term treatment and the importance of lifestyle modifications.  AGREE: Multiple dietary modification options and treatment options were discussed and  Melonie agreed to the above obesity treatment plan.  I, Doreene Nest, am acting as transcriptionist for Eber Jones, MD  I have reviewed the above documentation for accuracy and completeness, and I agree with the above. - Ilene Qua, MD

## 2018-02-11 ENCOUNTER — Ambulatory Visit (INDEPENDENT_AMBULATORY_CARE_PROVIDER_SITE_OTHER): Payer: 59 | Admitting: Family Medicine

## 2018-02-11 VITALS — BP 127/79 | HR 81 | Temp 98.1°F | Ht 70.0 in | Wt 266.0 lb

## 2018-02-11 DIAGNOSIS — E282 Polycystic ovarian syndrome: Secondary | ICD-10-CM

## 2018-02-11 DIAGNOSIS — E559 Vitamin D deficiency, unspecified: Secondary | ICD-10-CM | POA: Diagnosis not present

## 2018-02-11 DIAGNOSIS — Z6838 Body mass index (BMI) 38.0-38.9, adult: Secondary | ICD-10-CM | POA: Diagnosis not present

## 2018-02-11 NOTE — Progress Notes (Signed)
Office: [phone]  /  Fax: [phone]   HPI:   Chief Complaint: OBESITY Rhonda [name] is here to discuss her progress with her obesity treatment plan. She is on the Category 2 plan with added cottage cheese or greek yogurt plus 100 calorie snack and is following her eating plan approximately 50 % of the time. She states she is exercising 0 minutes 0 times per week. Rhonda [name] went out to eat a few times and tried to make better choices. She finds she may have over indulged in food at those meals. Her weight is 266 lb (120.7 kg) today and has had a weight gain of 1 pound over a period of 2 weeks since her last visit. She has lost 7 lbs since starting treatment with [name].  Vitamin D deficiency Rhonda [name] has a diagnosis of vitamin D deficiency. She is currently taking vit D. Rhonda [name] admits fatigue and denies nausea, vomiting or muscle weakness.  PCOS (polycystic ovarian syndrome) Rhonda [name] has a diagnosis of PCOS. Her insulin level is elevated and she just restarted Spironolactone.  ALLERGIES: No Known Allergies  MEDICATIONS: Current Outpatient Medications on File Prior to Visit  Medication Sig Dispense Refill  . buPROPion (WELLBUTRIN XL) 300 MG 24 hr tablet Take 300 mg by mouth daily.    Marland Kitchen ELDERBERRY PO Take by mouth.    . levonorgestrel (MIRENA) 20 MCG/24HR IUD 1 each by Intrauterine route once.    . Prenatal Vit-Fe Fumarate-FA (PRENATAL MULTIVITAMIN) TABS tablet Take 1 tablet by mouth daily at 12 noon.    Marland Kitchen spironolactone (ALDACTONE) 25 MG tablet Take 100 mg by mouth daily.     . Vitamin D, Ergocalciferol, (DRISDOL) 50000 units CAPS capsule Take 1 capsule (50,000 Units total) by mouth every 7 (seven) days. 4 capsule 0   No current facility-administered medications on file prior to visit.     PAST MEDICAL HISTORY: Past Medical History:  Diagnosis Date  . Depression    treated  . PCOS (polycystic ovarian syndrome) 2008  . Plantar fasciitis     PAST SURGICAL HISTORY: Past Surgical  History:  Procedure Laterality Date  . CHOLECYSTECTOMY      SOCIAL HISTORY: Social History   Tobacco Use  . Smoking status: Never Smoker  . Smokeless tobacco: Never Used  Substance Use Topics  . Alcohol use: No  . Drug use: No    FAMILY HISTORY: Family History  Problem Relation Age of Onset  . High blood pressure Mother   . Depression Father   . Liver disease Father   . Alcoholism Father   . Diabetes Paternal Grandmother     ROS: Review of Systems  Constitutional: Positive for malaise/fatigue and weight loss.  Gastrointestinal: Negative for nausea and vomiting.  Musculoskeletal:       Negative for muscle weakness    PHYSICAL EXAM: Blood pressure 127/79, pulse 81, temperature 98.1 F (36.7 C), temperature source Oral, height 5\' 10"  (1.778 m), weight 266 lb (120.7 kg), SpO2 97 %, unknown if currently breastfeeding. Body mass index is 38.17 kg/m. Physical Exam  Constitutional: She is oriented to person, place, and time. She appears well-developed and well-nourished.  Cardiovascular: Normal rate.  Pulmonary/Chest: Effort normal.  Musculoskeletal: Normal range of motion.  Neurological: She is oriented to person, place, and time.  Skin: Skin is warm and dry.  Psychiatric: She has a normal mood and affect. Her behavior is normal.  Vitals reviewed.   RECENT LABS AND TESTS: BMET    Component Value Date/Time   NA  139 01/09/2018 1116   NA 142 08/06/2014 0215   K 4.5 01/09/2018 1116   K 3.5 08/06/2014 0215   CL 104 01/09/2018 1116   CL 108 (H) 08/06/2014 0215   CO2 23 01/09/2018 1116   CO2 20 (L) 08/06/2014 0215   GLUCOSE 84 01/09/2018 1116   GLUCOSE 74 09/18/2016 1804   GLUCOSE 113 (H) 08/06/2014 0215   BUN 11 01/09/2018 1116   BUN 15 08/06/2014 0215   CREATININE 0.89 01/09/2018 1116   CREATININE 1.00 08/06/2014 0215   CALCIUM 9.2 01/09/2018 1116   CALCIUM 9.3 08/06/2014 0215   GFRNONAA 86 01/09/2018 1116   GFRNONAA >60 08/06/2014 0215   GFRAA 99  01/09/2018 1116   GFRAA >60 08/06/2014 0215   Lab Results  Component Value Date   HGBA1C 5.2 01/09/2018   Lab Results  Component Value Date   INSULIN 16.5 01/09/2018   CBC    Component Value Date/Time   WBC 4.7 01/09/2018 1116   WBC 7.8 09/20/2016 0621   RBC 4.75 01/09/2018 1116   RBC 3.81 09/20/2016 0621   HGB 14.5 01/09/2018 1116   HCT 42.1 01/09/2018 1116   PLT 195 09/20/2016 0621   PLT 300 08/06/2014 0215   MCV 89 01/09/2018 1116   MCV 92 08/06/2014 0215   MCH 30.5 01/09/2018 1116   MCH 31.4 09/20/2016 0621   MCHC 34.4 01/09/2018 1116   MCHC 35.2 09/20/2016 0621   RDW 13.7 01/09/2018 1116   RDW 13.3 08/06/2014 0215   LYMPHSABS 1.3 01/09/2018 1116   LYMPHSABS 0.4 (L) 08/06/2014 0215   MONOABS 0.7 08/06/2014 0215   EOSABS 0.1 01/09/2018 1116   EOSABS 0.1 08/06/2014 0215   BASOSABS 0.0 01/09/2018 1116   BASOSABS 0.2 (H) 08/06/2014 0215   Iron/TIBC/Ferritin/ %Sat No results found for: IRON, TIBC, FERRITIN, IRONPCTSAT Lipid Panel     Component Value Date/Time   CHOL 153 01/09/2018 1116   TRIG 63 01/09/2018 1116   HDL 43 01/09/2018 1116   LDLCALC 97 01/09/2018 1116   Hepatic Function Panel     Component Value Date/Time   PROT 7.3 01/09/2018 1116   PROT 8.5 (H) 08/06/2014 0215   ALBUMIN 4.9 01/09/2018 1116   ALBUMIN 4.3 08/06/2014 0215   AST 33 01/09/2018 1116   AST 33 08/06/2014 0215   ALT 55 (H) 01/09/2018 1116   ALT 58 08/06/2014 0215   ALKPHOS 102 01/09/2018 1116   ALKPHOS 115 08/06/2014 0215   BILITOT 0.5 01/09/2018 1116   BILITOT 0.7 08/06/2014 0215      Component Value Date/Time   TSH 1.470 01/09/2018 1116   Results for CAILEY, TRIGUEROS (MRN [id number]) as of 02/11/2018 17:31  Ref. Range 01/09/2018 11:16  Vitamin D, 25-Hydroxy Latest Ref Range: 30.0 - 100.0 ng/mL 25.2 (L)   ASSESSMENT AND PLAN: Vitamin D deficiency  PCOS (polycystic ovarian syndrome)  Class 2 severe obesity with serious comorbidity and body mass index (BMI) of 38.0 to  38.9 in adult, unspecified obesity type (Rhonda [name])  PLAN:  Vitamin D Deficiency Rhonda [name] was informed that low vitamin D levels contributes to fatigue and are associated with obesity, breast, and colon cancer. She agrees to continue to take prescription Vit D @50 ,000 IU every week and will follow up for routine testing of vitamin D, at least 2-3 times per year. She was informed of the risk of over-replacement of vitamin D and agrees to not increase her dose unless she discusses this with [name] first.  PCOS (polycystic ovarian syndrome)  Rhonda [name] will continue Spironolactone and we will recheck insulin and Hgb A1c in 3 months. Rhonda [name] will follow up at the agreed upon time.  We spent > than 50% of the 15 minute visit on the counseling as documented in the note.  Obesity Rhonda [name] is currently in the action stage of change. As such, her goal is to continue with weight loss efforts She has agreed to follow the Category 2 plan +100 calories Rhonda [name] has been instructed to work up to a goal of 150 minutes of combined cardio and strengthening exercise per week for weight loss and overall health benefits. We discussed the following Behavioral Modification Strategies today: planning for success, increasing lean protein intake, increasing vegetables and work on meal planning and easy cooking plans  Rhonda [name] has agreed to follow up with our clinic in 2 weeks. She was informed of the importance of frequent follow up visits to maximize her success with intensive lifestyle modifications for her multiple health conditions.   OBESITY BEHAVIORAL INTERVENTION VISIT  Today's visit was # 3 out of 22.  Starting weight: 273 lbs Starting date: 01/09/18 Today's weight : 266 lbs  Today's date: 02/11/2018 Total lbs lost to date: 7 (Patients must lose 7 lbs in the first 6 months to continue with counseling)   ASK: We discussed the diagnosis of obesity with Karma Greaser today and Baleria agreed to give [name] permission to  discuss obesity behavioral modification therapy today.  ASSESS: Jakeisha has the diagnosis of obesity and her BMI today is 38.17 Eleshia is in the action stage of change   ADVISE: Riddhi was educated on the multiple health risks of obesity as well as the benefit of weight loss to improve her health. She was advised of the need for long term treatment and the importance of lifestyle modifications.  AGREE: Multiple dietary modification options and treatment options were discussed and  Chattie agreed to the above obesity treatment plan.  I, Doreene Nest, am acting as transcriptionist for Eber Jones, MD  I have reviewed the above documentation for accuracy and completeness, and I agree with the above. - Ilene Qua, MD

## 2018-02-24 ENCOUNTER — Telehealth: Payer: 59 | Admitting: Family

## 2018-02-24 DIAGNOSIS — N399 Disorder of urinary system, unspecified: Secondary | ICD-10-CM | POA: Diagnosis not present

## 2018-02-24 MED ORDER — NITROFURANTOIN MONOHYD MACRO 100 MG PO CAPS: 100.0000 mg | ORAL_CAPSULE | Freq: Two times a day (BID) | ORAL | 0 refills | Status: DC

## 2018-02-24 NOTE — Progress Notes (Signed)

## 2018-03-03 ENCOUNTER — Ambulatory Visit (INDEPENDENT_AMBULATORY_CARE_PROVIDER_SITE_OTHER): Payer: 59 | Admitting: Family Medicine

## 2018-03-06 ENCOUNTER — Encounter (INDEPENDENT_AMBULATORY_CARE_PROVIDER_SITE_OTHER): Payer: Self-pay

## 2018-03-06 ENCOUNTER — Ambulatory Visit (INDEPENDENT_AMBULATORY_CARE_PROVIDER_SITE_OTHER): Payer: 59 | Admitting: Physician Assistant

## 2018-03-10 ENCOUNTER — Ambulatory Visit (INDEPENDENT_AMBULATORY_CARE_PROVIDER_SITE_OTHER): Payer: 59 | Admitting: Physician Assistant

## 2018-03-10 VITALS — BP 114/80 | HR 76 | Temp 98.4°F | Ht 70.0 in | Wt 260.0 lb

## 2018-03-10 DIAGNOSIS — E559 Vitamin D deficiency, unspecified: Secondary | ICD-10-CM

## 2018-03-10 DIAGNOSIS — Z9189 Other specified personal risk factors, not elsewhere classified: Secondary | ICD-10-CM | POA: Diagnosis not present

## 2018-03-10 DIAGNOSIS — Z6837 Body mass index (BMI) 37.0-37.9, adult: Secondary | ICD-10-CM | POA: Diagnosis not present

## 2018-03-10 MED ORDER — VITAMIN D (ERGOCALCIFEROL) 1.25 MG (50000 UNIT) PO CAPS: 50000.0000 [IU] | ORAL_CAPSULE | ORAL | 0 refills | Status: DC

## 2018-03-11 NOTE — Progress Notes (Signed)
Office: [phone]  /  Fax: [phone]   HPI:   Chief Complaint: OBESITY Rhonda [name] is here to discuss her progress with her obesity treatment plan. She is on the Category 2 plan +100 calories and is following her eating plan approximately 70 % of the time. She states she is doing cardio and weight training for 30 to 45 minutes 4 times per week. Rhonda [name] did very well with weight loss. She was on vacation and practiced mindful eating. She reports not drinking as much water over the last few weeks. Her weight is 260 lb (117.9 kg) today and has had a weight loss of 6 pounds over a period of 4 weeks since her last visit. She has lost 13 lbs since starting treatment with [name].  Vitamin D deficiency Rhonda [name] has a diagnosis of vitamin D deficiency. She is currently taking vit D and last level was not at goal. Rhonda [name] denies nausea, vomiting or muscle weakness.  At risk for osteopenia and osteoporosis Rhonda [name] is at higher risk of osteopenia and osteoporosis due to vitamin D deficiency.   ALLERGIES: No Known Allergies  MEDICATIONS: Current Outpatient Medications on File Prior to Visit  Medication Sig Dispense Refill  . buPROPion (WELLBUTRIN XL) 300 MG 24 hr tablet Take 300 mg by mouth daily.    Marland Kitchen ELDERBERRY PO Take by mouth.    . levonorgestrel (MIRENA) 20 MCG/24HR IUD 1 each by Intrauterine route once.    . nitrofurantoin, macrocrystal-monohydrate, (MACROBID) 100 MG capsule Take 1 capsule (100 mg total) by mouth 2 (two) times daily. 10 capsule 0  . Prenatal Vit-Fe Fumarate-FA (PRENATAL MULTIVITAMIN) TABS tablet Take 1 tablet by mouth daily at 12 noon.    Marland Kitchen spironolactone (ALDACTONE) 25 MG tablet Take 100 mg by mouth daily.      No current facility-administered medications on file prior to visit.     PAST MEDICAL HISTORY: Past Medical History:  Diagnosis Date  . Depression    treated  . PCOS (polycystic ovarian syndrome) 2008  . Plantar fasciitis     PAST SURGICAL HISTORY: Past  Surgical History:  Procedure Laterality Date  . CHOLECYSTECTOMY      SOCIAL HISTORY: Social History   Tobacco Use  . Smoking status: Never Smoker  . Smokeless tobacco: Never Used  Substance Use Topics  . Alcohol use: No  . Drug use: No    FAMILY HISTORY: Family History  Problem Relation Age of Onset  . High blood pressure Mother   . Depression Father   . Liver disease Father   . Alcoholism Father   . Diabetes Paternal Grandmother     ROS: Review of Systems  Constitutional: Positive for weight loss.  Gastrointestinal: Negative for nausea and vomiting.  Musculoskeletal:       Negative for muscle weakness    PHYSICAL EXAM: Blood pressure 114/80, pulse 76, temperature 98.4 F (36.9 C), temperature source Oral, height 5\' 10"  (1.778 m), weight 260 lb (117.9 kg), SpO2 97 %, unknown if currently breastfeeding. Body mass index is 37.31 kg/m. Physical Exam  Constitutional: She is oriented to person, place, and time. She appears well-developed and well-nourished.  Cardiovascular: Normal rate.  Pulmonary/Chest: Effort normal.  Musculoskeletal: Normal range of motion.  Neurological: She is oriented to person, place, and time.  Skin: Skin is warm and dry.  Psychiatric: She has a normal mood and affect. Her behavior is normal.  Vitals reviewed.   RECENT LABS AND TESTS: BMET    Component Value Date/Time   NA  139 01/09/2018 1116   NA 142 08/06/2014 0215   K 4.5 01/09/2018 1116   K 3.5 08/06/2014 0215   CL 104 01/09/2018 1116   CL 108 (H) 08/06/2014 0215   CO2 23 01/09/2018 1116   CO2 20 (L) 08/06/2014 0215   GLUCOSE 84 01/09/2018 1116   GLUCOSE 74 09/18/2016 1804   GLUCOSE 113 (H) 08/06/2014 0215   BUN 11 01/09/2018 1116   BUN 15 08/06/2014 0215   CREATININE 0.89 01/09/2018 1116   CREATININE 1.00 08/06/2014 0215   CALCIUM 9.2 01/09/2018 1116   CALCIUM 9.3 08/06/2014 0215   GFRNONAA 86 01/09/2018 1116   GFRNONAA >60 08/06/2014 0215   GFRAA 99 01/09/2018 1116    GFRAA >60 08/06/2014 0215   Lab Results  Component Value Date   HGBA1C 5.2 01/09/2018   Lab Results  Component Value Date   INSULIN 16.5 01/09/2018   CBC    Component Value Date/Time   WBC 4.7 01/09/2018 1116   WBC 7.8 09/20/2016 0621   RBC 4.75 01/09/2018 1116   RBC 3.81 09/20/2016 0621   HGB 14.5 01/09/2018 1116   HCT 42.1 01/09/2018 1116   PLT 195 09/20/2016 0621   PLT 300 08/06/2014 0215   MCV 89 01/09/2018 1116   MCV 92 08/06/2014 0215   MCH 30.5 01/09/2018 1116   MCH 31.4 09/20/2016 0621   MCHC 34.4 01/09/2018 1116   MCHC 35.2 09/20/2016 0621   RDW 13.7 01/09/2018 1116   RDW 13.3 08/06/2014 0215   LYMPHSABS 1.3 01/09/2018 1116   LYMPHSABS 0.4 (L) 08/06/2014 0215   MONOABS 0.7 08/06/2014 0215   EOSABS 0.1 01/09/2018 1116   EOSABS 0.1 08/06/2014 0215   BASOSABS 0.0 01/09/2018 1116   BASOSABS 0.2 (H) 08/06/2014 0215   Iron/TIBC/Ferritin/ %Sat No results found for: IRON, TIBC, FERRITIN, IRONPCTSAT Lipid Panel     Component Value Date/Time   CHOL 153 01/09/2018 1116   TRIG 63 01/09/2018 1116   HDL 43 01/09/2018 1116   LDLCALC 97 01/09/2018 1116   Hepatic Function Panel     Component Value Date/Time   PROT 7.3 01/09/2018 1116   PROT 8.5 (H) 08/06/2014 0215   ALBUMIN 4.9 01/09/2018 1116   ALBUMIN 4.3 08/06/2014 0215   AST 33 01/09/2018 1116   AST 33 08/06/2014 0215   ALT 55 (H) 01/09/2018 1116   ALT 58 08/06/2014 0215   ALKPHOS 102 01/09/2018 1116   ALKPHOS 115 08/06/2014 0215   BILITOT 0.5 01/09/2018 1116   BILITOT 0.7 08/06/2014 0215      Component Value Date/Time   TSH 1.470 01/09/2018 1116   Results for DALPHINE, COWIE (MRN [id number]) as of 03/11/2018 08:19  Ref. Range 01/09/2018 11:16  Vitamin D, 25-Hydroxy Latest Ref Range: 30.0 - 100.0 ng/mL 25.2 (L)   ASSESSMENT AND PLAN: Vitamin D deficiency - Plan: Vitamin D, Ergocalciferol, (DRISDOL) 50000 units CAPS capsule  At risk for osteoporosis  Class 2 severe obesity with serious  comorbidity and body mass index (BMI) of 37.0 to 37.9 in adult, unspecified obesity type ([place])  PLAN:  Vitamin D Deficiency Rhonda [name] was informed that low vitamin D levels contributes to fatigue and are associated with obesity, breast, and colon cancer. She agrees to continue to take prescription Vit D @50 ,000 IU every week #4 with no refills and will follow up for routine testing of vitamin D, at least 2-3 times per year. She was informed of the risk of over-replacement of vitamin D and agrees to not increase  her dose unless she discusses this with [name] first. Rhonda [name] agrees to follow up as directed.  At risk for osteopenia and osteoporosis Rhonda [name] is at risk for osteopenia and osteoporosis due to her vitamin D deficiency. She was encouraged to take her vitamin D and follow her higher calcium diet and increase strengthening exercise to help strengthen her bones and decrease her risk of osteopenia and osteoporosis.  Obesity Rhonda [name] is currently in the action stage of change. As such, her goal is to continue with weight loss efforts She has agreed to follow the Category 2 plan +100 calories Rhonda [name] has been instructed to work up to a goal of 150 minutes of combined cardio and strengthening exercise per week for weight loss and overall health benefits. We discussed the following Behavioral Modification Strategies today: increase H2O intake and work on meal planning and easy cooking plans  Rhonda [name] has agreed to follow up with our clinic in 3 weeks. She was informed of the importance of frequent follow up visits to maximize her success with intensive lifestyle modifications for her multiple health conditions.   OBESITY BEHAVIORAL INTERVENTION VISIT  Today's visit was # 4 out of 22.  Starting weight: 273 lbs Starting date: 01/09/18 Today's weight : 260 lbs Today's date: 03/10/2018 Total lbs lost to date: 13    ASK: We discussed the diagnosis of obesity with Rhonda [name] today and Rhonda [name]  agreed to give [name] permission to discuss obesity behavioral modification therapy today.  ASSESS: Rhonda [name] has the diagnosis of obesity and her BMI today is 37.31 Rhonda [name] is in the action stage of change   ADVISE: Rhonda [name] was educated on the multiple health risks of obesity as well as the benefit of weight loss to improve her health. She was advised of the need for long term treatment and the importance of lifestyle modifications.  AGREE: Multiple dietary modification options and treatment options were discussed and  Rhonda [name] agreed to the above obesity treatment plan.  IDoreene Nest, am acting as transcriptionist for [employer], PA-C

## 2018-03-31 ENCOUNTER — Ambulatory Visit (INDEPENDENT_AMBULATORY_CARE_PROVIDER_SITE_OTHER): Payer: 59 | Admitting: Physician Assistant

## 2018-03-31 VITALS — BP 119/79 | HR 60 | Temp 97.9°F | Ht 70.0 in | Wt 255.0 lb

## 2018-03-31 DIAGNOSIS — E559 Vitamin D deficiency, unspecified: Secondary | ICD-10-CM | POA: Diagnosis not present

## 2018-03-31 DIAGNOSIS — E8881 Metabolic syndrome: Secondary | ICD-10-CM | POA: Diagnosis not present

## 2018-03-31 DIAGNOSIS — Z6836 Body mass index (BMI) 36.0-36.9, adult: Secondary | ICD-10-CM

## 2018-03-31 DIAGNOSIS — Z9189 Other specified personal risk factors, not elsewhere classified: Secondary | ICD-10-CM

## 2018-03-31 MED ORDER — VITAMIN D (ERGOCALCIFEROL) 1.25 MG (50000 UNIT) PO CAPS
50000.0000 [IU] | ORAL_CAPSULE | ORAL | 0 refills | Status: DC
Start: 2018-03-31 — End: 2018-04-30

## 2018-04-01 NOTE — Progress Notes (Signed)
Office: [phone]  /  Fax: [phone]   HPI:   Chief Complaint: OBESITY Rhonda [name] is here to discuss her progress with her obesity treatment plan. She is on the Category 2 plan + 100 calories and is following her eating plan approximately 75 % of the time. She states she is doing cardio and strengthening for 60 minutes 3 times per week. Rhonda [name] did very well with weight loss. She reports that she has been following the plan but has started having excessive hunger since starting a new workout routine. She notes skipping snacks and portions of breakfast at times.  Her weight is 255 lb (115.7 kg) today and has had a weight loss of 5 pounds over a period of 3 weeks since her last visit. She has lost 18 lbs since starting treatment with [name].  Vitamin D Deficiency Rhonda [name] has a diagnosis of vitamin D deficiency. She is on prescription Vit D and denies nausea, vomiting or muscle weakness.  At risk for osteopenia and osteoporosis Rhonda [name] is at higher risk of osteopenia and osteoporosis due to vitamin D deficiency.   Insulin Resistance Rhonda [name] has a diagnosis of insulin resistance based on her elevated fasting insulin level >5. Although Rhonda [name]'s blood glucose readings are still under good control, insulin resistance puts her at greater risk of metabolic syndrome and diabetes. She reports polyphagia since starting workout routine. She is not taking metformin currently and continues to work on diet and exercise to decrease risk of diabetes.  ALLERGIES: No Known Allergies  MEDICATIONS: Current Outpatient Medications on File Prior to Visit  Medication Sig Dispense Refill  . buPROPion (WELLBUTRIN XL) 300 MG 24 hr tablet Take 300 mg by mouth daily.    Rhonda [name] Kitchen ELDERBERRY PO Take by mouth.    . levonorgestrel (MIRENA) 20 MCG/24HR IUD 1 each by Intrauterine route once.    . nitrofurantoin, macrocrystal-monohydrate, (MACROBID) 100 MG capsule Take 1 capsule (100 mg total) by mouth 2 (two) times daily. 10  capsule 0  . Prenatal Vit-Fe Fumarate-FA (PRENATAL MULTIVITAMIN) TABS tablet Take 1 tablet by mouth daily at 12 noon.    Rhonda [name] Kitchen spironolactone (ALDACTONE) 25 MG tablet Take 100 mg by mouth daily.      No current facility-administered medications on file prior to visit.     PAST MEDICAL HISTORY: Past Medical History:  Diagnosis Date  . Depression    treated  . PCOS (polycystic ovarian syndrome) 2008  . Plantar fasciitis     PAST SURGICAL HISTORY: Past Surgical History:  Procedure Laterality Date  . CHOLECYSTECTOMY      SOCIAL HISTORY: Social History   Tobacco Use  . Smoking status: Never Smoker  . Smokeless tobacco: Never Used  Substance Use Topics  . Alcohol use: No  . Drug use: No    FAMILY HISTORY: Family History  Problem Relation Age of Onset  . High blood pressure Mother   . Depression Father   . Liver disease Father   . Alcoholism Father   . Diabetes Paternal Grandmother     ROS: Review of Systems  Constitutional: Positive for weight loss.  Gastrointestinal: Negative for nausea and vomiting.  Musculoskeletal:       Negative muscle weakness  Endo/Heme/Allergies:       Positive polyphagia    PHYSICAL EXAM: Blood pressure 119/79, pulse 60, temperature 97.9 F (36.6 C), temperature source Oral, height 5\' 10"  (1.778 m), weight 255 lb (115.7 kg), SpO2 99 %, unknown if currently breastfeeding. Body mass index is 36.59 kg/m. Physical Exam  Constitutional: She is oriented to person, place, and time. She appears well-developed and well-nourished.  Cardiovascular: Normal rate.  Pulmonary/Chest: Effort normal.  Musculoskeletal: Normal range of motion.  Neurological: She is oriented to person, place, and time.  Skin: Skin is warm and dry.  Psychiatric: She has a normal mood and affect. Her behavior is normal.  Vitals reviewed.   RECENT LABS AND TESTS: BMET    Component Value Date/Time   NA 139 01/09/2018 1116   NA 142 08/06/2014 0215   K 4.5 01/09/2018  1116   K 3.5 08/06/2014 0215   CL 104 01/09/2018 1116   CL 108 (H) 08/06/2014 0215   CO2 23 01/09/2018 1116   CO2 20 (L) 08/06/2014 0215   GLUCOSE 84 01/09/2018 1116   GLUCOSE 74 09/18/2016 1804   GLUCOSE 113 (H) 08/06/2014 0215   BUN 11 01/09/2018 1116   BUN 15 08/06/2014 0215   CREATININE 0.89 01/09/2018 1116   CREATININE 1.00 08/06/2014 0215   CALCIUM 9.2 01/09/2018 1116   CALCIUM 9.3 08/06/2014 0215   GFRNONAA 86 01/09/2018 1116   GFRNONAA >60 08/06/2014 0215   GFRAA 99 01/09/2018 1116   GFRAA >60 08/06/2014 0215   Lab Results  Component Value Date   HGBA1C 5.2 01/09/2018   Lab Results  Component Value Date   INSULIN 16.5 01/09/2018   CBC    Component Value Date/Time   WBC 4.7 01/09/2018 1116   WBC 7.8 09/20/2016 0621   RBC 4.75 01/09/2018 1116   RBC 3.81 09/20/2016 0621   HGB 14.5 01/09/2018 1116   HCT 42.1 01/09/2018 1116   PLT 195 09/20/2016 0621   PLT 300 08/06/2014 0215   MCV 89 01/09/2018 1116   MCV 92 08/06/2014 0215   MCH 30.5 01/09/2018 1116   MCH 31.4 09/20/2016 0621   MCHC 34.4 01/09/2018 1116   MCHC 35.2 09/20/2016 0621   RDW 13.7 01/09/2018 1116   RDW 13.3 08/06/2014 0215   LYMPHSABS 1.3 01/09/2018 1116   LYMPHSABS 0.4 (L) 08/06/2014 0215   MONOABS 0.7 08/06/2014 0215   EOSABS 0.1 01/09/2018 1116   EOSABS 0.1 08/06/2014 0215   BASOSABS 0.0 01/09/2018 1116   BASOSABS 0.2 (H) 08/06/2014 0215   Iron/TIBC/Ferritin/ %Sat No results found for: IRON, TIBC, FERRITIN, IRONPCTSAT Lipid Panel     Component Value Date/Time   CHOL 153 01/09/2018 1116   TRIG 63 01/09/2018 1116   HDL 43 01/09/2018 1116   LDLCALC 97 01/09/2018 1116   Hepatic Function Panel     Component Value Date/Time   PROT 7.3 01/09/2018 1116   PROT 8.5 (H) 08/06/2014 0215   ALBUMIN 4.9 01/09/2018 1116   ALBUMIN 4.3 08/06/2014 0215   AST 33 01/09/2018 1116   AST 33 08/06/2014 0215   ALT 55 (H) 01/09/2018 1116   ALT 58 08/06/2014 0215   ALKPHOS 102 01/09/2018 1116    ALKPHOS 115 08/06/2014 0215   BILITOT 0.5 01/09/2018 1116   BILITOT 0.7 08/06/2014 0215      Component Value Date/Time   TSH 1.470 01/09/2018 1116  Results for Rhonda [name] (MRN [id number]) as of 04/01/2018 09:56  Ref. Range 01/09/2018 11:16  Vitamin D, 25-Hydroxy Latest Ref Range: 30.0 - 100.0 ng/mL 25.2 (L)    ASSESSMENT AND PLAN: Vitamin D deficiency - Plan: Vitamin D, Ergocalciferol, (DRISDOL) 50000 units CAPS capsule  Insulin resistance  At risk for osteoporosis  Class 2 severe obesity with serious comorbidity and body mass index (BMI) of 36.0 to 36.9 in  adult, unspecified obesity type ([place])  PLAN:  Vitamin D Deficiency Rhonda [name] was informed that low vitamin D levels contributes to fatigue and are associated with obesity, breast, and colon cancer. Ivery agrees to continue taking prescription Vit D @50 ,000 IU every week #4 and we will refill for 1 month. She will follow up for routine testing of vitamin D, at least 2-3 times per year. She was informed of the risk of over-replacement of vitamin D and agrees to not increase her dose unless she discusses this with [name] first. Rhonda [name] agrees to follow up with our clinic in 2 to 3 weeks.  At risk for osteopenia and osteoporosis Rhonda [name] is at risk for osteopenia and osteoporsis due to her vitamin D deficiency. She was encouraged to take her vitamin D and follow her higher calcium diet and increase strengthening exercise to help strengthen her bones and decrease her risk of osteopenia and osteoporosis.  Insulin Resistance Rhonda [name] will continue to work on weight loss, diet, exercise, and decreasing simple carbohydrates in her diet to help decrease the risk of diabetes. We dicussed metformin including benefits and risks. She was informed that eating too many simple carbohydrates or too many calories at one sitting increases the likelihood of GI side effects. Rhonda [name] declined metformin for now and prescription was not written today. Rhonda [name]  agrees to follow up with our clinic in 2 to 3 weeks as directed to monitor her progress.  Obesity Rhonda [name] is currently in the action stage of change. As such, her goal is to continue with weight loss efforts She has agreed to follow the Category 2 plan + 100 calories Rhonda [name] has been instructed to work up to a goal of 150 minutes of combined cardio and strengthening exercise per week for weight loss and overall health benefits. We discussed the following Behavioral Modification Strategies today: increasing lean protein intake and work on meal planning and easy cooking plans   Rhonda [name] has agreed to follow up with our clinic in 2 to 3 weeks. She was informed of the importance of frequent follow up visits to maximize her success with intensive lifestyle modifications for her multiple health conditions.   OBESITY BEHAVIORAL INTERVENTION VISIT  Today's visit was # 5 out of 22.  Starting weight: 273 lbs Starting date: 01/09/18 Today's weight : 255 lbs  Today's date: 03/31/2018 Total lbs lost to date: 85    ASK: We discussed the diagnosis of obesity with Rhonda [name] today and Rhonda [name] agreed to give [name] permission to discuss obesity behavioral modification therapy today.  ASSESS: Amaranta has the diagnosis of obesity and her BMI today is 36.59 Kauri is in the action stage of change   ADVISE: Kylar was educated on the multiple health risks of obesity as well as the benefit of weight loss to improve her health. She was advised of the need for long term treatment and the importance of lifestyle modifications.  AGREE: Multiple dietary modification options and treatment options were discussed and  Rhonda [name] agreed to the above obesity treatment plan.  Rhonda [name], am acting as transcriptionist for Abby Potash, PA-C I, Abby Potash, PA-C have reviewed above note and agree with its content

## 2018-04-07 DIAGNOSIS — H5213 Myopia, bilateral: Secondary | ICD-10-CM | POA: Diagnosis not present

## 2018-04-14 ENCOUNTER — Ambulatory Visit (INDEPENDENT_AMBULATORY_CARE_PROVIDER_SITE_OTHER): Payer: Self-pay | Admitting: Physician Assistant

## 2018-04-30 ENCOUNTER — Ambulatory Visit (INDEPENDENT_AMBULATORY_CARE_PROVIDER_SITE_OTHER): Payer: 59 | Admitting: Physician Assistant

## 2018-04-30 VITALS — BP 106/71 | HR 69 | Temp 97.9°F | Ht 70.0 in | Wt 253.0 lb

## 2018-04-30 DIAGNOSIS — Z9189 Other specified personal risk factors, not elsewhere classified: Secondary | ICD-10-CM

## 2018-04-30 DIAGNOSIS — E8881 Metabolic syndrome: Secondary | ICD-10-CM | POA: Diagnosis not present

## 2018-04-30 DIAGNOSIS — E559 Vitamin D deficiency, unspecified: Secondary | ICD-10-CM | POA: Diagnosis not present

## 2018-04-30 DIAGNOSIS — Z6836 Body mass index (BMI) 36.0-36.9, adult: Secondary | ICD-10-CM | POA: Diagnosis not present

## 2018-04-30 MED ORDER — VITAMIN D (ERGOCALCIFEROL) 1.25 MG (50000 UNIT) PO CAPS: 50000.0000 [IU] | ORAL_CAPSULE | ORAL | 0 refills | Status: DC

## 2018-05-01 NOTE — Progress Notes (Signed)
Office: [phone]  /  Fax: [phone]   HPI:   Chief Complaint: OBESITY Rhonda [name] is here to discuss her progress with her obesity treatment plan. She is on the Category 2 plan + 100 calories and is following her eating plan approximately 25 % of the time. She states she is doing cardio and strengthening for 1-1 and 1/2 hours 1 time per week. Rhonda [name] did well with weight loss. She reports that with going on vacation, she got off track with her eating. She is stopping more often for fast food.  Her weight is 253 lb (114.8 kg) today and has had a weight loss of 2 pounds over a period of 4 to 5 weeks since her last visit. She has lost 20 lbs since starting treatment with [name].  Vitamin D Deficiency Rhonda [name] has a diagnosis of vitamin D deficiency. She is on prescription Vit D and denies nausea, vomiting or muscle weakness.  Insulin Resistance Rhonda [name] has a diagnosis of insulin resistance based on her elevated fasting insulin level >5. Although Rhonda [name]'s blood glucose readings are still under good control, insulin resistance puts her at greater risk of metabolic syndrome and diabetes. She is not taking metformin currently and continues to work on diet and exercise to decrease risk of diabetes. She denies polyphagia.  At risk for diabetes Rhonda [name] is at higher than average risk for developing diabetes due to her obesity and insulin resistance. She currently denies polyuria or polydipsia.  ALLERGIES: No Known Allergies  MEDICATIONS: Current Outpatient Medications on File Prior to Visit  Medication Sig Dispense Refill  . buPROPion (WELLBUTRIN XL) 300 MG 24 hr tablet Take 300 mg by mouth daily.    Rhonda [name] Kitchen ELDERBERRY PO Take by mouth.    . levonorgestrel (MIRENA) 20 MCG/24HR IUD 1 each by Intrauterine route once.    . nitrofurantoin, macrocrystal-monohydrate, (MACROBID) 100 MG capsule Take 1 capsule (100 mg total) by mouth 2 (two) times daily. 10 capsule 0  . Prenatal Vit-Fe Fumarate-FA (PRENATAL  MULTIVITAMIN) TABS tablet Take 1 tablet by mouth daily at 12 noon.    Rhonda [name] Kitchen spironolactone (ALDACTONE) 25 MG tablet Take 100 mg by mouth daily.      No current facility-administered medications on file prior to visit.     PAST MEDICAL HISTORY: Past Medical History:  Diagnosis Date  . Depression    treated  . PCOS (polycystic ovarian syndrome) 2008  . Plantar fasciitis     PAST SURGICAL HISTORY: Past Surgical History:  Procedure Laterality Date  . CHOLECYSTECTOMY      SOCIAL HISTORY: Social History   Tobacco Use  . Smoking status: Never Smoker  . Smokeless tobacco: Never Used  Substance Use Topics  . Alcohol use: No  . Drug use: No    FAMILY HISTORY: Family History  Problem Relation Age of Onset  . High blood pressure Mother   . Depression Father   . Liver disease Father   . Alcoholism Father   . Diabetes Paternal Grandmother     ROS: Review of Systems  Constitutional: Positive for weight loss.  Gastrointestinal: Negative for nausea and vomiting.  Genitourinary: Negative for frequency.  Musculoskeletal:       Negative muscle weakness  Endo/Heme/Allergies: Negative for polydipsia.       Negative polyphagia    PHYSICAL EXAM: Blood pressure 106/71, pulse 69, temperature 97.9 F (36.6 C), temperature source Oral, height 5\' 10"  (1.778 m), weight 253 lb (114.8 kg), SpO2 99 %, unknown if currently breastfeeding. Body mass index is  36.3 kg/m. Physical Exam  Constitutional: She is oriented to person, place, and time. She appears well-developed and well-nourished.  Cardiovascular: Normal rate.  Pulmonary/Chest: Effort normal.  Musculoskeletal: Normal range of motion.  Neurological: She is oriented to person, place, and time.  Skin: Skin is warm and dry.  Psychiatric: She has a normal mood and affect. Her behavior is normal.  Vitals reviewed.   RECENT LABS AND TESTS: BMET    Component Value Date/Time   NA 139 01/09/2018 1116   NA 142 08/06/2014 0215   K 4.5  01/09/2018 1116   K 3.5 08/06/2014 0215   CL 104 01/09/2018 1116   CL 108 (H) 08/06/2014 0215   CO2 23 01/09/2018 1116   CO2 20 (L) 08/06/2014 0215   GLUCOSE 84 01/09/2018 1116   GLUCOSE 74 09/18/2016 1804   GLUCOSE 113 (H) 08/06/2014 0215   BUN 11 01/09/2018 1116   BUN 15 08/06/2014 0215   CREATININE 0.89 01/09/2018 1116   CREATININE 1.00 08/06/2014 0215   CALCIUM 9.2 01/09/2018 1116   CALCIUM 9.3 08/06/2014 0215   GFRNONAA 86 01/09/2018 1116   GFRNONAA >60 08/06/2014 0215   GFRAA 99 01/09/2018 1116   GFRAA >60 08/06/2014 0215   Lab Results  Component Value Date   HGBA1C 5.2 01/09/2018   Lab Results  Component Value Date   INSULIN 16.5 01/09/2018   CBC    Component Value Date/Time   WBC 4.7 01/09/2018 1116   WBC 7.8 09/20/2016 0621   RBC 4.75 01/09/2018 1116   RBC 3.81 09/20/2016 0621   HGB 14.5 01/09/2018 1116   HCT 42.1 01/09/2018 1116   PLT 195 09/20/2016 0621   PLT 300 08/06/2014 0215   MCV 89 01/09/2018 1116   MCV 92 08/06/2014 0215   MCH 30.5 01/09/2018 1116   MCH 31.4 09/20/2016 0621   MCHC 34.4 01/09/2018 1116   MCHC 35.2 09/20/2016 0621   RDW 13.7 01/09/2018 1116   RDW 13.3 08/06/2014 0215   LYMPHSABS 1.3 01/09/2018 1116   LYMPHSABS 0.4 (L) 08/06/2014 0215   MONOABS 0.7 08/06/2014 0215   EOSABS 0.1 01/09/2018 1116   EOSABS 0.1 08/06/2014 0215   BASOSABS 0.0 01/09/2018 1116   BASOSABS 0.2 (H) 08/06/2014 0215   Iron/TIBC/Ferritin/ %Sat No results found for: IRON, TIBC, FERRITIN, IRONPCTSAT Lipid Panel     Component Value Date/Time   CHOL 153 01/09/2018 1116   TRIG 63 01/09/2018 1116   HDL 43 01/09/2018 1116   LDLCALC 97 01/09/2018 1116   Hepatic Function Panel     Component Value Date/Time   PROT 7.3 01/09/2018 1116   PROT 8.5 (H) 08/06/2014 0215   ALBUMIN 4.9 01/09/2018 1116   ALBUMIN 4.3 08/06/2014 0215   AST 33 01/09/2018 1116   AST 33 08/06/2014 0215   ALT 55 (H) 01/09/2018 1116   ALT 58 08/06/2014 0215   ALKPHOS 102 01/09/2018  1116   ALKPHOS 115 08/06/2014 0215   BILITOT 0.5 01/09/2018 1116   BILITOT 0.7 08/06/2014 0215      Component Value Date/Time   TSH 1.470 01/09/2018 1116  Results for Rhonda, [name] (MRN [id number]) as of 05/01/2018 13:21  Ref. Range 01/09/2018 11:16  Vitamin D, 25-Hydroxy Latest Ref Range: 30.0 - 100.0 ng/mL 25.2 (L)    ASSESSMENT AND PLAN: Vitamin D deficiency - Plan: Vitamin D, Ergocalciferol, (DRISDOL) 50000 units CAPS capsule  Insulin resistance  At risk for diabetes mellitus  Class 2 severe obesity with serious comorbidity and body mass index (  BMI) of 36.0 to 36.9 in adult, unspecified obesity type (Rhonda [name])  PLAN:  Vitamin D Deficiency Rhonda [name] was informed that low vitamin D levels contributes to fatigue and are associated with obesity, breast, and colon cancer. Rhonda [name] agrees to continue taking prescription Vit D @50 ,000 IU every week #4 and we will refill for 1 month. She will follow up for routine testing of vitamin D, at least 2-3 times per year. She was informed of the risk of over-replacement of vitamin D and agrees to not increase her dose unless she discusses this with [name] first. Rhonda [name] agrees to follow up with our clinic in 2 weeks.  Insulin Resistance Rhonda [name] will continue to work on weight loss, diet, exercise, and decreasing simple carbohydrates in her diet to help decrease the risk of diabetes. We dicussed metformin including benefits and risks. She was informed that eating too many simple carbohydrates or too many calories at one sitting increases the likelihood of GI side effects. Rhonda [name] declined metformin for now and prescription was not written today. Rhonda [name] agrees to follow up with our clinic in 2 weeks as directed to monitor her progress.  Diabetes risk counselling Rhonda [name] was given extended (15 minutes) diabetes prevention counseling today. She is 32 y.o. female and has risk factors for diabetes including obesity and insulin resistance. We discussed intensive  lifestyle modifications today with an emphasis on weight loss as well as increasing exercise and decreasing simple carbohydrates in her diet.  Obesity Rhonda [name] is currently in the action stage of change. As such, her goal is to continue with weight loss efforts She has agreed to follow the Category 2 plan + 100 calories Rhonda [name] has been instructed to work up to a goal of 150 minutes of combined cardio and strengthening exercise per week for weight loss and overall health benefits. We discussed the following Behavioral Modification Strategies today: decrease eating out and work on meal planning and easy cooking plans   Rhonda [name] has agreed to follow up with our clinic in 2 weeks. She was informed of the importance of frequent follow up visits to maximize her success with intensive lifestyle modifications for her multiple health conditions.   OBESITY BEHAVIORAL INTERVENTION VISIT  Today's visit was # 6  Starting weight: 273 lbs Starting date: 01/09/18 Today's weight : 253 lbs  Today's date: 04/30/2018 Total lbs lost to date: 20    ASK: We discussed the diagnosis of obesity with Rhonda [name] today and Rhonda [name] agreed to give [name] permission to discuss obesity behavioral modification therapy today.  ASSESS: Rhonda [name] has the diagnosis of obesity and her BMI today is 36.3 Rhonda [name] is in the action stage of change   ADVISE: Karime was educated on the multiple health risks of obesity as well as the benefit of weight loss to improve her health. She was advised of the need for long term treatment and the importance of lifestyle modifications.  AGREE: Multiple dietary modification options and treatment options were discussed and  Rhonda [name] agreed to the above obesity treatment plan.  Wilhemena Durie, am acting as transcriptionist for Abby Potash, PA-C I, Abby Potash, PA-C have reviewed above note and agree with its content

## 2018-05-02 DIAGNOSIS — Z79899 Other long term (current) drug therapy: Secondary | ICD-10-CM | POA: Diagnosis not present

## 2018-05-02 DIAGNOSIS — E8881 Metabolic syndrome: Secondary | ICD-10-CM | POA: Diagnosis not present

## 2018-05-02 DIAGNOSIS — Z131 Encounter for screening for diabetes mellitus: Secondary | ICD-10-CM | POA: Diagnosis not present

## 2018-05-02 DIAGNOSIS — E559 Vitamin D deficiency, unspecified: Secondary | ICD-10-CM | POA: Diagnosis not present

## 2018-05-02 DIAGNOSIS — Z13220 Encounter for screening for lipoid disorders: Secondary | ICD-10-CM | POA: Diagnosis not present

## 2018-05-06 ENCOUNTER — Encounter (INDEPENDENT_AMBULATORY_CARE_PROVIDER_SITE_OTHER): Payer: Self-pay | Admitting: Physician Assistant

## 2018-05-09 DIAGNOSIS — F329 Major depressive disorder, single episode, unspecified: Secondary | ICD-10-CM | POA: Diagnosis not present

## 2018-05-09 DIAGNOSIS — E282 Polycystic ovarian syndrome: Secondary | ICD-10-CM | POA: Diagnosis not present

## 2018-05-09 DIAGNOSIS — Z0000 Encounter for general adult medical examination without abnormal findings: Secondary | ICD-10-CM | POA: Diagnosis not present

## 2018-05-09 DIAGNOSIS — E8881 Metabolic syndrome: Secondary | ICD-10-CM | POA: Diagnosis not present

## 2018-05-19 ENCOUNTER — Ambulatory Visit (INDEPENDENT_AMBULATORY_CARE_PROVIDER_SITE_OTHER): Payer: 59 | Admitting: Physician Assistant

## 2018-05-21 ENCOUNTER — Encounter (INDEPENDENT_AMBULATORY_CARE_PROVIDER_SITE_OTHER): Payer: Self-pay | Admitting: Physician Assistant

## 2018-05-21 ENCOUNTER — Ambulatory Visit (INDEPENDENT_AMBULATORY_CARE_PROVIDER_SITE_OTHER): Payer: 59 | Admitting: Physician Assistant

## 2018-05-21 VITALS — BP 114/74 | HR 74 | Temp 97.7°F | Ht 70.0 in | Wt 252.0 lb

## 2018-05-21 DIAGNOSIS — Z6836 Body mass index (BMI) 36.0-36.9, adult: Secondary | ICD-10-CM | POA: Diagnosis not present

## 2018-05-21 DIAGNOSIS — E559 Vitamin D deficiency, unspecified: Secondary | ICD-10-CM | POA: Diagnosis not present

## 2018-05-21 DIAGNOSIS — Z9189 Other specified personal risk factors, not elsewhere classified: Secondary | ICD-10-CM

## 2018-05-21 DIAGNOSIS — E8881 Metabolic syndrome: Secondary | ICD-10-CM | POA: Diagnosis not present

## 2018-05-21 MED ORDER — METFORMIN HCL 500 MG PO TABS: 500.0000 mg | ORAL_TABLET | Freq: Every day | ORAL | 0 refills | Status: DC

## 2018-05-21 MED ORDER — VITAMIN D (ERGOCALCIFEROL) 1.25 MG (50000 UNIT) PO CAPS
50000.0000 [IU] | ORAL_CAPSULE | ORAL | 0 refills | Status: DC
Start: 2018-05-21 — End: 2018-06-05

## 2018-05-22 NOTE — Progress Notes (Signed)
Office: [phone]  /  Fax: [phone]   HPI:   Chief Complaint: OBESITY Rhonda [name] is here to discuss her progress with her obesity treatment plan. She is on the Category 2 plan + 100 calories and is following her eating plan approximately 25 % of the time. She states she is exercising 0 minutes 0 times per week. Rhonda [name] did well with weight loss. She reports struggling with stopping at fast food places for snacks. She is having afternoon cravings and hunger between lunch and dinner.  Her weight is 252 lb (114.3 kg) today and has had a weight loss of 1 pound over a period of 3 weeks since her last visit. She has lost 21 lbs since starting treatment with [name].  Insulin Resistance Rhonda [name] has a diagnosis of insulin resistance based on her elevated fasting insulin level >5. Although Rhonda [name]'s blood glucose readings are still under good control, insulin resistance puts her at greater risk of metabolic syndrome and diabetes. She denies polyphagia, but reports cravings and increased hunger in the afternoon. She is not taking metformin currently and continues to work on diet and exercise to decrease risk of diabetes.  Vitamin D Deficiency Rhonda [name] has a diagnosis of vitamin D deficiency. She is currently taking prescription Vit D and denies nausea, vomiting or muscle weakness.  At risk for osteopenia and osteoporosis Rhonda [name] is at higher risk of osteopenia and osteoporosis due to vitamin D deficiency.   ALLERGIES: No Known Allergies  MEDICATIONS: Current Outpatient Medications on File Prior to Visit  Medication Sig Dispense Refill  . buPROPion (WELLBUTRIN XL) 300 MG 24 hr tablet Take 300 mg by mouth daily.    Marland Kitchen ELDERBERRY PO Take by mouth.    . levonorgestrel (MIRENA) 20 MCG/24HR IUD 1 each by Intrauterine route once.    . nitrofurantoin, macrocrystal-monohydrate, (MACROBID) 100 MG capsule Take 1 capsule (100 mg total) by mouth 2 (two) times daily. 10 capsule 0  . Prenatal Vit-Fe Fumarate-FA  (PRENATAL MULTIVITAMIN) TABS tablet Take 1 tablet by mouth daily at 12 noon.    Marland Kitchen spironolactone (ALDACTONE) 25 MG tablet Take 100 mg by mouth daily.      No current facility-administered medications on file prior to visit.     PAST MEDICAL HISTORY: Past Medical History:  Diagnosis Date  . Depression    treated  . PCOS (polycystic ovarian syndrome) 2008  . Plantar fasciitis     PAST SURGICAL HISTORY: Past Surgical History:  Procedure Laterality Date  . CHOLECYSTECTOMY      SOCIAL HISTORY: Social History   Tobacco Use  . Smoking status: Never Smoker  . Smokeless tobacco: Never Used  Substance Use Topics  . Alcohol use: No  . Drug use: No    FAMILY HISTORY: Family History  Problem Relation Age of Onset  . High blood pressure Mother   . Depression Father   . Liver disease Father   . Alcoholism Father   . Diabetes Paternal Grandmother     ROS: Review of Systems  Constitutional: Positive for weight loss.  Gastrointestinal: Negative for nausea and vomiting.  Musculoskeletal:       Negative muscle weakness  Endo/Heme/Allergies:       Negative polyphagia    PHYSICAL EXAM: Blood pressure 114/74, pulse 74, temperature 97.7 F (36.5 C), temperature source Oral, height 5\' 10"  (1.778 m), weight 252 lb (114.3 kg), SpO2 98 %, unknown if currently breastfeeding. Body mass index is 36.16 kg/m. Physical Exam  Constitutional: She is oriented to person, place,  and time. She appears well-developed and well-nourished.  Cardiovascular: Normal rate.  Pulmonary/Chest: Effort normal.  Musculoskeletal: Normal range of motion.  Neurological: She is oriented to person, place, and time.  Skin: Skin is warm and dry.  Psychiatric: She has a normal mood and affect. Her behavior is normal.  Vitals reviewed.   RECENT LABS AND TESTS: BMET    Component Value Date/Time   NA 139 01/09/2018 1116   NA 142 08/06/2014 0215   K 4.5 01/09/2018 1116   K 3.5 08/06/2014 0215   CL 104  01/09/2018 1116   CL 108 (H) 08/06/2014 0215   CO2 23 01/09/2018 1116   CO2 20 (L) 08/06/2014 0215   GLUCOSE 84 01/09/2018 1116   GLUCOSE 74 09/18/2016 1804   GLUCOSE 113 (H) 08/06/2014 0215   BUN 11 01/09/2018 1116   BUN 15 08/06/2014 0215   CREATININE 0.89 01/09/2018 1116   CREATININE 1.00 08/06/2014 0215   CALCIUM 9.2 01/09/2018 1116   CALCIUM 9.3 08/06/2014 0215   GFRNONAA 86 01/09/2018 1116   GFRNONAA >60 08/06/2014 0215   GFRAA 99 01/09/2018 1116   GFRAA >60 08/06/2014 0215   Lab Results  Component Value Date   HGBA1C 5.2 01/09/2018   Lab Results  Component Value Date   INSULIN 16.5 01/09/2018   CBC    Component Value Date/Time   WBC 4.7 01/09/2018 1116   WBC 7.8 09/20/2016 0621   RBC 4.75 01/09/2018 1116   RBC 3.81 09/20/2016 0621   HGB 14.5 01/09/2018 1116   HCT 42.1 01/09/2018 1116   PLT 195 09/20/2016 0621   PLT 300 08/06/2014 0215   MCV 89 01/09/2018 1116   MCV 92 08/06/2014 0215   MCH 30.5 01/09/2018 1116   MCH 31.4 09/20/2016 0621   MCHC 34.4 01/09/2018 1116   MCHC 35.2 09/20/2016 0621   RDW 13.7 01/09/2018 1116   RDW 13.3 08/06/2014 0215   LYMPHSABS 1.3 01/09/2018 1116   LYMPHSABS 0.4 (L) 08/06/2014 0215   MONOABS 0.7 08/06/2014 0215   EOSABS 0.1 01/09/2018 1116   EOSABS 0.1 08/06/2014 0215   BASOSABS 0.0 01/09/2018 1116   BASOSABS 0.2 (H) 08/06/2014 0215   Iron/TIBC/Ferritin/ %Sat No results found for: IRON, TIBC, FERRITIN, IRONPCTSAT Lipid Panel     Component Value Date/Time   CHOL 153 01/09/2018 1116   TRIG 63 01/09/2018 1116   HDL 43 01/09/2018 1116   LDLCALC 97 01/09/2018 1116   Hepatic Function Panel     Component Value Date/Time   PROT 7.3 01/09/2018 1116   PROT 8.5 (H) 08/06/2014 0215   ALBUMIN 4.9 01/09/2018 1116   ALBUMIN 4.3 08/06/2014 0215   AST 33 01/09/2018 1116   AST 33 08/06/2014 0215   ALT 55 (H) 01/09/2018 1116   ALT 58 08/06/2014 0215   ALKPHOS 102 01/09/2018 1116   ALKPHOS 115 08/06/2014 0215   BILITOT 0.5  01/09/2018 1116   BILITOT 0.7 08/06/2014 0215      Component Value Date/Time   TSH 1.470 01/09/2018 1116  Results for MERCEDES, FORT (MRN [id number]) as of 05/22/2018 08:55  Ref. Range 01/09/2018 11:16  Vitamin D, 25-Hydroxy Latest Ref Range: 30.0 - 100.0 ng/mL 25.2 (L)    ASSESSMENT AND PLAN: Insulin resistance - Plan: metFORMIN (GLUCOPHAGE) 500 MG tablet  Vitamin D deficiency - Plan: Vitamin D, Ergocalciferol, (DRISDOL) 50000 units CAPS capsule  At risk for osteoporosis  Class 2 severe obesity with serious comorbidity and body mass index (BMI) of 36.0 to 36.9 in  adult, unspecified obesity type ([place])  PLAN:  Insulin Resistance Rhonda [name] will continue to work on weight loss, exercise, and decreasing simple carbohydrates in her diet to help decrease the risk of diabetes. We dicussed metformin including benefits and risks. She was informed that eating too many simple carbohydrates or too many calories at one sitting increases the likelihood of GI side effects. Rhonda [name] agrees to start metformin 500 mg 1 tablet PO at lunch qd #30 with no refills. Rhonda [name] agrees to follow up with our clinic in 2 weeks as directed to monitor her progress.  Vitamin D Deficiency Rhonda [name] was informed that low vitamin D levels contributes to fatigue and are associated with obesity, breast, and colon cancer. Rhonda [name] agrees to continue taking prescription Vit D @50 ,000 IU every week #4 and we will refill for 1 month. She will follow up for routine testing of vitamin D, at least 2-3 times per year. She was informed of the risk of over-replacement of vitamin D and agrees to not increase her dose unless she discusses this with [name] first. Rhonda [name] agrees to follow up with our clinic in 2 weeks.  At risk for osteopenia and osteoporosis Rhonda [name] was given extended  (15 minutes) osteoporosis prevention counseling today. Rhonda [name] is at risk for osteopenia and osteoporsis due to her vitamin D deficiency. She was encouraged to  take her vitamin D and follow her higher calcium diet and increase strengthening exercise to help strengthen her bones and decrease her risk of osteopenia and osteoporosis.  Obesity Rhonda [name] is currently in the action stage of change. As such, her goal is to continue with weight loss efforts She has agreed to follow the Category 2 plan + 100 calories Rhonda [name] has been instructed to work up to a goal of 150 minutes of combined cardio and strengthening exercise per week for weight loss and overall health benefits. We discussed the following Behavioral Modification Strategies today: ways to avoid night time snacking and better snacking choices   Rhonda [name] has agreed to follow up with our clinic in 2 weeks. She was informed of the importance of frequent follow up visits to maximize her success with intensive lifestyle modifications for her multiple health conditions.   OBESITY BEHAVIORAL INTERVENTION VISIT  Today's visit was # 7   Starting weight: 273 lbs Starting date: 01/09/18 Today's weight : 252 lbs  Today's date: 05/21/2018 Total lbs lost to date: 21    ASK: We discussed the diagnosis of obesity with Karma Greaser today and Asya agreed to give [name] permission to discuss obesity behavioral modification therapy today.  ASSESS: Jacky has the diagnosis of obesity and her BMI today is 36.16 Netasha is in the action stage of change   ADVISE: Rhonda [name] was educated on the multiple health risks of obesity as well as the benefit of weight loss to improve her health. She was advised of the need for long term treatment and the importance of lifestyle modifications.  AGREE: Multiple dietary modification options and treatment options were discussed and  Stephanny agreed to the above obesity treatment plan.  Wilhemena Durie, am acting as transcriptionist for Abby Potash, PA-C I, Abby Potash, PA-C have reviewed above note and agree with its content

## 2018-06-05 ENCOUNTER — Encounter (INDEPENDENT_AMBULATORY_CARE_PROVIDER_SITE_OTHER): Payer: Self-pay | Admitting: Physician Assistant

## 2018-06-05 ENCOUNTER — Ambulatory Visit (INDEPENDENT_AMBULATORY_CARE_PROVIDER_SITE_OTHER): Payer: 59 | Admitting: Physician Assistant

## 2018-06-05 VITALS — BP 119/80 | HR 83 | Temp 97.7°F | Ht 70.0 in | Wt 250.0 lb

## 2018-06-05 DIAGNOSIS — Z9189 Other specified personal risk factors, not elsewhere classified: Secondary | ICD-10-CM

## 2018-06-05 DIAGNOSIS — E88819 Insulin resistance, unspecified: Secondary | ICD-10-CM

## 2018-06-05 DIAGNOSIS — E559 Vitamin D deficiency, unspecified: Secondary | ICD-10-CM | POA: Diagnosis not present

## 2018-06-05 DIAGNOSIS — E8881 Metabolic syndrome: Secondary | ICD-10-CM | POA: Diagnosis not present

## 2018-06-05 DIAGNOSIS — Z6835 Body mass index (BMI) 35.0-35.9, adult: Secondary | ICD-10-CM | POA: Diagnosis not present

## 2018-06-05 MED ORDER — VITAMIN D (ERGOCALCIFEROL) 1.25 MG (50000 UNIT) PO CAPS: 50000.0000 [IU] | ORAL_CAPSULE | ORAL | 0 refills | Status: DC

## 2018-06-10 NOTE — Progress Notes (Signed)
Office: [phone]  /  Fax: [phone]   HPI:   Chief Complaint: OBESITY Rhonda [name] is here to discuss her progress with her obesity treatment plan. She is on the Category 2 plan +100 calories and is following her eating plan approximately 75 % of the time. She states she is exercising 0 minutes 0 times per week. Rhonda [name] did very well with weight loss. She reports that she is eating out less from fast food places and she is making better food choices. She has a bachelorette party tomorrow night and she is asking about celebration strategies. Her weight is 250 lb (113.4 kg) today and has had a weight loss of 2 pounds over a period of 2 weeks since her last visit. She has lost 23 lbs since starting treatment with [name].  Vitamin D deficiency Rhonda [name] has a diagnosis of vitamin D deficiency. She is currently taking vit D and denies nausea, vomiting or muscle weakness.  At risk for osteopenia and osteoporosis Rhonda [name] is at higher risk of osteopenia and osteoporosis due to vitamin D deficiency.   Insulin Resistance Rhonda [name] has a diagnosis of insulin resistance based on her elevated fasting insulin level >5. Although Rhonda [name]'s blood glucose readings are still under good control, insulin resistance puts her at greater risk of metabolic syndrome and diabetes. She is taking metformin currently and she denies nausea, vomiting or diarrhea. She continues to work on diet and exercise to decrease risk of diabetes. Rhonda [name] denies polyphagia or hypoglycemia.   ALLERGIES: No Known Allergies  MEDICATIONS: Current Outpatient Medications on File Prior to Visit  Medication Sig Dispense Refill  . buPROPion (WELLBUTRIN XL) 300 MG 24 hr tablet Take 300 mg by mouth daily.    Marland Kitchen ELDERBERRY PO Take by mouth.    . levonorgestrel (MIRENA) 20 MCG/24HR IUD 1 each by Intrauterine route once.    . metFORMIN (GLUCOPHAGE) 500 MG tablet Take 1 tablet (500 mg total) by mouth daily with breakfast. 30 tablet 0  .  nitrofurantoin, macrocrystal-monohydrate, (MACROBID) 100 MG capsule Take 1 capsule (100 mg total) by mouth 2 (two) times daily. 10 capsule 0  . Prenatal Vit-Fe Fumarate-FA (PRENATAL MULTIVITAMIN) TABS tablet Take 1 tablet by mouth daily at 12 noon.    Marland Kitchen spironolactone (ALDACTONE) 25 MG tablet Take 100 mg by mouth daily.      No current facility-administered medications on file prior to visit.     PAST MEDICAL HISTORY: Past Medical History:  Diagnosis Date  . Depression    treated  . PCOS (polycystic ovarian syndrome) 2008  . Plantar fasciitis     PAST SURGICAL HISTORY: Past Surgical History:  Procedure Laterality Date  . CHOLECYSTECTOMY      SOCIAL HISTORY: Social History   Tobacco Use  . Smoking status: Never Smoker  . Smokeless tobacco: Never Used  Substance Use Topics  . Alcohol use: No  . Drug use: No    FAMILY HISTORY: Family History  Problem Relation Age of Onset  . High blood pressure Mother   . Depression Father   . Liver disease Father   . Alcoholism Father   . Diabetes Paternal Grandmother     ROS: Review of Systems  Constitutional: Positive for weight loss.  Gastrointestinal: Negative for diarrhea, nausea and vomiting.  Musculoskeletal:       Negative for muscle weakness  Endo/Heme/Allergies:       Negative for polyphagia Negative for hypoglycemia    PHYSICAL EXAM: Blood pressure 119/80, pulse 83, temperature 97.7 F (36.5 C),  temperature source Oral, height 5\' 10"  (1.778 m), weight 250 lb (113.4 kg), SpO2 100 %, unknown if currently breastfeeding. Body mass index is 35.87 kg/m. Physical Exam  Constitutional: She is oriented to person, place, and time. She appears well-developed and well-nourished.  Cardiovascular: Normal rate.  Pulmonary/Chest: Effort normal.  Musculoskeletal: Normal range of motion.  Neurological: She is oriented to person, place, and time.  Skin: Skin is warm and dry.  Psychiatric: She has a normal mood and affect. Her  behavior is normal.  Vitals reviewed.   RECENT LABS AND TESTS: BMET    Component Value Date/Time   NA 139 01/09/2018 1116   NA 142 08/06/2014 0215   K 4.5 01/09/2018 1116   K 3.5 08/06/2014 0215   CL 104 01/09/2018 1116   CL 108 (H) 08/06/2014 0215   CO2 23 01/09/2018 1116   CO2 20 (L) 08/06/2014 0215   GLUCOSE 84 01/09/2018 1116   GLUCOSE 74 09/18/2016 1804   GLUCOSE 113 (H) 08/06/2014 0215   BUN 11 01/09/2018 1116   BUN 15 08/06/2014 0215   CREATININE 0.89 01/09/2018 1116   CREATININE 1.00 08/06/2014 0215   CALCIUM 9.2 01/09/2018 1116   CALCIUM 9.3 08/06/2014 0215   GFRNONAA 86 01/09/2018 1116   GFRNONAA >60 08/06/2014 0215   GFRAA 99 01/09/2018 1116   GFRAA >60 08/06/2014 0215   Lab Results  Component Value Date   HGBA1C 5.2 01/09/2018   Lab Results  Component Value Date   INSULIN 16.5 01/09/2018   CBC    Component Value Date/Time   WBC 4.7 01/09/2018 1116   WBC 7.8 09/20/2016 0621   RBC 4.75 01/09/2018 1116   RBC 3.81 09/20/2016 0621   HGB 14.5 01/09/2018 1116   HCT 42.1 01/09/2018 1116   PLT 195 09/20/2016 0621   PLT 300 08/06/2014 0215   MCV 89 01/09/2018 1116   MCV 92 08/06/2014 0215   MCH 30.5 01/09/2018 1116   MCH 31.4 09/20/2016 0621   MCHC 34.4 01/09/2018 1116   MCHC 35.2 09/20/2016 0621   RDW 13.7 01/09/2018 1116   RDW 13.3 08/06/2014 0215   LYMPHSABS 1.3 01/09/2018 1116   LYMPHSABS 0.4 (L) 08/06/2014 0215   MONOABS 0.7 08/06/2014 0215   EOSABS 0.1 01/09/2018 1116   EOSABS 0.1 08/06/2014 0215   BASOSABS 0.0 01/09/2018 1116   BASOSABS 0.2 (H) 08/06/2014 0215   Iron/TIBC/Ferritin/ %Sat No results found for: IRON, TIBC, FERRITIN, IRONPCTSAT Lipid Panel     Component Value Date/Time   CHOL 153 01/09/2018 1116   TRIG 63 01/09/2018 1116   HDL 43 01/09/2018 1116   LDLCALC 97 01/09/2018 1116   Hepatic Function Panel     Component Value Date/Time   PROT 7.3 01/09/2018 1116   PROT 8.5 (H) 08/06/2014 0215   ALBUMIN 4.9 01/09/2018 1116     ALBUMIN 4.3 08/06/2014 0215   AST 33 01/09/2018 1116   AST 33 08/06/2014 0215   ALT 55 (H) 01/09/2018 1116   ALT 58 08/06/2014 0215   ALKPHOS 102 01/09/2018 1116   ALKPHOS 115 08/06/2014 0215   BILITOT 0.5 01/09/2018 1116   BILITOT 0.7 08/06/2014 0215      Component Value Date/Time   TSH 1.470 01/09/2018 1116   Results for LEXXI, KOSLOW (MRN [id number]) as of 06/10/2018 11:54  Ref. Range 01/09/2018 11:16  Vitamin D, 25-Hydroxy Latest Ref Range: 30.0 - 100.0 ng/mL 25.2 (L)   ASSESSMENT AND PLAN: Vitamin D deficiency - Plan: Vitamin D, Ergocalciferol, (DRISDOL)  50000 units CAPS capsule  Insulin resistance  At risk for osteoporosis  Class 2 severe obesity with serious comorbidity and body mass index (BMI) of 35.0 to 35.9 in adult, unspecified obesity type ([place])  PLAN:  Vitamin D Deficiency Rhonda [name] was informed that low vitamin D levels contributes to fatigue and are associated with obesity, breast, and colon cancer. She agrees to continue to take prescription Vit D @50 ,000 IU every week #4 with no refills and will follow up for routine testing of vitamin D, at least 2-3 times per year. She was informed of the risk of over-replacement of vitamin D and agrees to not increase her dose unless she discusses this with [name] first. Rhonda [name] agrees to follow up as directed.  At risk for osteopenia and osteoporosis Rhonda [name] was given extended  (15 minutes) osteoporosis prevention counseling today. Rhonda [name] is at risk for osteopenia and osteoporosis due to her vitamin D deficiency. She was encouraged to take her vitamin D and follow her higher calcium diet and increase strengthening exercise to help strengthen her bones and decrease her risk of osteopenia and osteoporosis.  Insulin Resistance Rhonda [name] will continue to work on weight loss, exercise, and decreasing simple carbohydrates in her diet to help decrease the risk of diabetes. We dicussed metformin including benefits and risks. She was  informed that eating too many simple carbohydrates or too many calories at one sitting increases the likelihood of GI side effects. Rhonda [name] will continue metformin for now and prescription was not written today. Rhonda [name] agreed to follow up with [name] as directed to monitor her progress.  Obesity Rhonda [name] is currently in the action stage of change. As such, her goal is to continue with weight loss efforts She has agreed to follow the Category 2 plan +100 calories Rhonda [name] has been instructed to work up to a goal of 150 minutes of combined cardio and strengthening exercise per week for weight loss and overall health benefits. We discussed the following Behavioral Modification Strategies today: work on meal planning and easy cooking plans and celebration  eating strategies   Rhonda [name] has agreed to follow up with our clinic in 2 weeks. She was informed of the importance of frequent follow up visits to maximize her success with intensive lifestyle modifications for her multiple health conditions.   OBESITY BEHAVIORAL INTERVENTION VISIT  Today's visit was # 8   Starting weight: 273 lbs Starting date: 01/09/18 Today's weight : 250 lbs  Today's date: 06/05/2018 Total lbs lost to date: 6   ASK: We discussed the diagnosis of obesity with Rhonda [name] today and Rhonda [name] agreed to give [name] permission to discuss obesity behavioral modification therapy today.  ASSESS: Rhonda [name] has the diagnosis of obesity and her BMI today is 35.87 Rhonda [name] is in the action stage of change   ADVISE: Rhonda [name] was educated on the multiple health risks of obesity as well as the benefit of weight loss to improve her health. She was advised of the need for long term treatment and the importance of lifestyle modifications to improve her current health and to decrease her risk of future health problems.  AGREE: Multiple dietary modification options and treatment options were discussed and  Rhonda [name] agreed to follow the  recommendations documented in the above note.  ARRANGE: Rhonda [name] was educated on the importance of frequent visits to treat obesity as outlined per CMS and USPSTF guidelines and agreed to schedule her next follow up appointment today.  IDoreene Nest, am acting as transcriptionist for [employer], PA-C  IAbby Potash, PA-C have reviewed above note and agree with its content

## 2018-06-19 ENCOUNTER — Ambulatory Visit (INDEPENDENT_AMBULATORY_CARE_PROVIDER_SITE_OTHER): Payer: 59 | Admitting: Physician Assistant

## 2018-06-26 ENCOUNTER — Encounter (INDEPENDENT_AMBULATORY_CARE_PROVIDER_SITE_OTHER): Payer: Self-pay | Admitting: Physician Assistant

## 2018-06-26 ENCOUNTER — Ambulatory Visit (INDEPENDENT_AMBULATORY_CARE_PROVIDER_SITE_OTHER): Payer: 59 | Admitting: Physician Assistant

## 2018-06-26 VITALS — BP 113/71 | HR 67 | Temp 97.9°F | Ht 70.0 in | Wt 249.0 lb

## 2018-06-26 DIAGNOSIS — Z9189 Other specified personal risk factors, not elsewhere classified: Secondary | ICD-10-CM | POA: Diagnosis not present

## 2018-06-26 DIAGNOSIS — E559 Vitamin D deficiency, unspecified: Secondary | ICD-10-CM

## 2018-06-26 DIAGNOSIS — Z6835 Body mass index (BMI) 35.0-35.9, adult: Secondary | ICD-10-CM | POA: Diagnosis not present

## 2018-06-26 DIAGNOSIS — E8881 Metabolic syndrome: Secondary | ICD-10-CM | POA: Diagnosis not present

## 2018-06-26 MED ORDER — VITAMIN D (ERGOCALCIFEROL) 1.25 MG (50000 UNIT) PO CAPS: 50000.0000 [IU] | ORAL_CAPSULE | ORAL | 0 refills | Status: DC

## 2018-06-30 NOTE — Progress Notes (Signed)
Office: [phone]  /  Fax: [phone]   HPI:   Chief Complaint: OBESITY Rhonda [name] is here to discuss her progress with her obesity treatment plan. She is on the Category 2 plan + 100 calories and is following her eating plan approximately 50 % of the time. She states she is doing spin class for 60 minutes 1 time per week. Rhonda [name] did well with weight loss. She reports that she has been getting [employer] coffee daily for the last couple of weeks.  Her weight is 249 lb (112.9 kg) today and has had a weight loss of 1 pound over a period of 3 weeks since her last visit. She has lost 24 lbs since starting treatment with [name].  Vitamin D Deficiency Rhonda [name] has a diagnosis of vitamin D deficiency. She is currently taking prescription Vit D and denies nausea, vomiting or muscle weakness.  At risk for osteopenia and osteoporosis Rhonda [name] is at higher risk of osteopenia and osteoporosis due to vitamin D deficiency.   Insulin Resistance Rhonda [name] has a diagnosis of insulin resistance based on her elevated fasting insulin level >5. Although Rhonda [name]'s blood glucose readings are still under good control, insulin resistance puts her at greater risk of metabolic syndrome and diabetes. She is taking metformin currently and continues to work on diet and exercise to decrease risk of diabetes. She denies polyphagia.  ALLERGIES: No Known Allergies  MEDICATIONS: Current Outpatient Medications on File Prior to Visit  Medication Sig Dispense Refill  . buPROPion (WELLBUTRIN XL) 300 MG 24 hr tablet Take 300 mg by mouth daily.    Marland Kitchen ELDERBERRY PO Take by mouth.    . levonorgestrel (MIRENA) 20 MCG/24HR IUD 1 each by Intrauterine route once.    . metFORMIN (GLUCOPHAGE) 500 MG tablet Take 1 tablet (500 mg total) by mouth daily with breakfast. 30 tablet 0  . nitrofurantoin, macrocrystal-monohydrate, (MACROBID) 100 MG capsule Take 1 capsule (100 mg total) by mouth 2 (two) times daily. 10 capsule 0  . Prenatal Vit-Fe  Fumarate-FA (PRENATAL MULTIVITAMIN) TABS tablet Take 1 tablet by mouth daily at 12 noon.    Marland Kitchen spironolactone (ALDACTONE) 25 MG tablet Take 100 mg by mouth daily.      No current facility-administered medications on file prior to visit.     PAST MEDICAL HISTORY: Past Medical History:  Diagnosis Date  . Depression    treated  . PCOS (polycystic ovarian syndrome) 2008  . Plantar fasciitis     PAST SURGICAL HISTORY: Past Surgical History:  Procedure Laterality Date  . CHOLECYSTECTOMY      SOCIAL HISTORY: Social History   Tobacco Use  . Smoking status: Never Smoker  . Smokeless tobacco: Never Used  Substance Use Topics  . Alcohol use: No  . Drug use: No    FAMILY HISTORY: Family History  Problem Relation Age of Onset  . High blood pressure Mother   . Depression Father   . Liver disease Father   . Alcoholism Father   . Diabetes Paternal Grandmother     ROS: Review of Systems  Constitutional: Positive for weight loss.  Gastrointestinal: Negative for nausea and vomiting.  Musculoskeletal:       Negative muscle weakness  Endo/Heme/Allergies:       Negative polyphagia    PHYSICAL EXAM: Blood pressure 113/71, pulse 67, temperature 97.9 F (36.6 C), temperature source Oral, height 5\' 10"  (1.778 m), weight 249 lb (112.9 kg), SpO2 99 %, unknown if currently breastfeeding. Body mass index is 35.73 kg/m. Physical Exam  Constitutional: She is oriented to person, place, and time. She appears well-developed and well-nourished.  Cardiovascular: Normal rate.  Pulmonary/Chest: Effort normal.  Musculoskeletal: Normal range of motion.  Neurological: She is oriented to person, place, and time.  Skin: Skin is warm and dry.  Psychiatric: She has a normal mood and affect. Her behavior is normal.  Vitals reviewed.   RECENT LABS AND TESTS: BMET    Component Value Date/Time   NA 139 01/09/2018 1116   NA 142 08/06/2014 0215   K 4.5 01/09/2018 1116   K 3.5 08/06/2014 0215    CL 104 01/09/2018 1116   CL 108 (H) 08/06/2014 0215   CO2 23 01/09/2018 1116   CO2 20 (L) 08/06/2014 0215   GLUCOSE 84 01/09/2018 1116   GLUCOSE 74 09/18/2016 1804   GLUCOSE 113 (H) 08/06/2014 0215   BUN 11 01/09/2018 1116   BUN 15 08/06/2014 0215   CREATININE 0.89 01/09/2018 1116   CREATININE 1.00 08/06/2014 0215   CALCIUM 9.2 01/09/2018 1116   CALCIUM 9.3 08/06/2014 0215   GFRNONAA 86 01/09/2018 1116   GFRNONAA >60 08/06/2014 0215   GFRAA 99 01/09/2018 1116   GFRAA >60 08/06/2014 0215   Lab Results  Component Value Date   HGBA1C 5.2 01/09/2018   Lab Results  Component Value Date   INSULIN 16.5 01/09/2018   CBC    Component Value Date/Time   WBC 4.7 01/09/2018 1116   WBC 7.8 09/20/2016 0621   RBC 4.75 01/09/2018 1116   RBC 3.81 09/20/2016 0621   HGB 14.5 01/09/2018 1116   HCT 42.1 01/09/2018 1116   PLT 195 09/20/2016 0621   PLT 300 08/06/2014 0215   MCV 89 01/09/2018 1116   MCV 92 08/06/2014 0215   MCH 30.5 01/09/2018 1116   MCH 31.4 09/20/2016 0621   MCHC 34.4 01/09/2018 1116   MCHC 35.2 09/20/2016 0621   RDW 13.7 01/09/2018 1116   RDW 13.3 08/06/2014 0215   LYMPHSABS 1.3 01/09/2018 1116   LYMPHSABS 0.4 (L) 08/06/2014 0215   MONOABS 0.7 08/06/2014 0215   EOSABS 0.1 01/09/2018 1116   EOSABS 0.1 08/06/2014 0215   BASOSABS 0.0 01/09/2018 1116   BASOSABS 0.2 (H) 08/06/2014 0215   Iron/TIBC/Ferritin/ %Sat No results found for: IRON, TIBC, FERRITIN, IRONPCTSAT Lipid Panel     Component Value Date/Time   CHOL 153 01/09/2018 1116   TRIG 63 01/09/2018 1116   HDL 43 01/09/2018 1116   LDLCALC 97 01/09/2018 1116   Hepatic Function Panel     Component Value Date/Time   PROT 7.3 01/09/2018 1116   PROT 8.5 (H) 08/06/2014 0215   ALBUMIN 4.9 01/09/2018 1116   ALBUMIN 4.3 08/06/2014 0215   AST 33 01/09/2018 1116   AST 33 08/06/2014 0215   ALT 55 (H) 01/09/2018 1116   ALT 58 08/06/2014 0215   ALKPHOS 102 01/09/2018 1116   ALKPHOS 115 08/06/2014 0215    BILITOT 0.5 01/09/2018 1116   BILITOT 0.7 08/06/2014 0215      Component Value Date/Time   TSH 1.470 01/09/2018 1116  Results for Rhonda [name] (MRN [id number]) as of 06/30/2018 12:38  Ref. Range 01/09/2018 11:16  Vitamin D, 25-Hydroxy Latest Ref Range: 30.0 - 100.0 ng/mL 25.2 (L)    ASSESSMENT AND PLAN: Vitamin D deficiency - Plan: Vitamin D, Ergocalciferol, (DRISDOL) 1.25 MG (50000 UT) CAPS capsule  Insulin resistance  At risk for osteoporosis  Class 2 severe obesity with serious comorbidity and body mass index (BMI) of 35.0 to  35.9 in adult, unspecified obesity type (Rhonda [name])  PLAN:  Vitamin D Deficiency Rhonda [name] was informed that low vitamin D levels contributes to fatigue and are associated with obesity, breast, and colon cancer. Rhonda [name] agrees to continue taking prescription Vit D @50 ,000 IU every week #4 and we will refill for 1 month. She will follow up for routine testing of vitamin D, at least 2-3 times per year. She was informed of the risk of over-replacement of vitamin D and agrees to not increase her dose unless she discusses this with [name] first. Rhonda [name] agrees to follow up with our clinic in 3 weeks.  At risk for osteopenia and osteoporosis Rhonda [name] was given extended (15 minutes) osteoporosis prevention counseling today. Rhonda [name] is at risk for osteopenia and osteoporsis due to her vitamin D deficiency. She was encouraged to take her vitamin D and follow her higher calcium diet and increase strengthening exercise to help strengthen her bones and decrease her risk of osteopenia and osteoporosis.  Insulin Resistance Rhonda [name] will continue to work on weight loss, diet, exercise, and decreasing simple carbohydrates in her diet to help decrease the risk of diabetes. We dicussed metformin including benefits and risks. She was informed that eating too many simple carbohydrates or too many calories at one sitting increases the likelihood of GI side effects. Rhonda [name] agrees to continue  taking metformin and she agrees to follow up with our clinic in 3 weeks as directed to monitor her progress.  Obesity Rhonda [name] is currently in the action stage of change. As such, her goal is to continue with weight loss efforts She has agreed to follow the Category 2 plan + 100 calories Rhonda [name] has been instructed to work up to a goal of 150 minutes of combined cardio and strengthening exercise per week for weight loss and overall health benefits. We discussed the following Behavioral Modification Strategies today: decrease eating out and work on meal planning and easy cooking plans   Rhonda [name] has agreed to follow up with our clinic in 3 weeks. She was informed of the importance of frequent follow up visits to maximize her success with intensive lifestyle modifications for her multiple health conditions.   OBESITY BEHAVIORAL INTERVENTION VISIT  Today's visit was # 9  Starting weight: 273 lbs Starting date: 01/09/18 Today's weight : 249 lbs Today's date: 06/26/2018 Total lbs lost to date: 24    ASK: We discussed the diagnosis of obesity with Rhonda [name] today and Rhonda [name] agreed to give [name] permission to discuss obesity behavioral modification therapy today.  ASSESS: Rhonda [name] has the diagnosis of obesity and her BMI today is 35.73 Rhonda [name] is in the action stage of change   ADVISE: Rhonda [name] was educated on the multiple health risks of obesity as well as the benefit of weight loss to improve her health. She was advised of the need for long term treatment and the importance of lifestyle modifications.  AGREE: Multiple dietary modification options and treatment options were discussed and  Kelda agreed to the above obesity treatment plan.  Rhonda [name], am acting as transcriptionist for Abby Potash, PA-C I, Abby Potash, PA-C have reviewed above note and agree with its content

## 2018-07-04 ENCOUNTER — Encounter (INDEPENDENT_AMBULATORY_CARE_PROVIDER_SITE_OTHER): Payer: Self-pay | Admitting: Physician Assistant

## 2018-07-07 ENCOUNTER — Other Ambulatory Visit (INDEPENDENT_AMBULATORY_CARE_PROVIDER_SITE_OTHER): Payer: Self-pay | Admitting: Physician Assistant

## 2018-07-07 DIAGNOSIS — E8881 Metabolic syndrome: Secondary | ICD-10-CM

## 2018-07-07 DIAGNOSIS — E88819 Insulin resistance, unspecified: Secondary | ICD-10-CM

## 2018-07-16 ENCOUNTER — Encounter (INDEPENDENT_AMBULATORY_CARE_PROVIDER_SITE_OTHER): Payer: Self-pay | Admitting: Physician Assistant

## 2018-07-16 ENCOUNTER — Ambulatory Visit (INDEPENDENT_AMBULATORY_CARE_PROVIDER_SITE_OTHER): Payer: 59 | Admitting: Physician Assistant

## 2018-07-16 VITALS — BP 119/82 | HR 74 | Temp 97.7°F | Ht 70.0 in | Wt 251.0 lb

## 2018-07-16 DIAGNOSIS — E88819 Insulin resistance, unspecified: Secondary | ICD-10-CM

## 2018-07-16 DIAGNOSIS — E8881 Metabolic syndrome: Secondary | ICD-10-CM

## 2018-07-16 DIAGNOSIS — Z6836 Body mass index (BMI) 36.0-36.9, adult: Secondary | ICD-10-CM

## 2018-07-16 DIAGNOSIS — F3289 Other specified depressive episodes: Secondary | ICD-10-CM

## 2018-07-16 DIAGNOSIS — Z9189 Other specified personal risk factors, not elsewhere classified: Secondary | ICD-10-CM

## 2018-07-16 DIAGNOSIS — Z0184 Encounter for antibody response examination: Secondary | ICD-10-CM | POA: Diagnosis not present

## 2018-07-16 DIAGNOSIS — Z111 Encounter for screening for respiratory tuberculosis: Secondary | ICD-10-CM | POA: Diagnosis not present

## 2018-07-16 MED ORDER — METFORMIN HCL 500 MG PO TABS: 500.0000 mg | ORAL_TABLET | Freq: Every day | ORAL | 0 refills | Status: DC

## 2018-07-23 NOTE — Progress Notes (Signed)
Office: [phone]  /  Fax: [phone]   HPI:   Chief Complaint: OBESITY Rhonda [name] is here to discuss her progress with her obesity treatment plan. She is on the  follow the Category 2 plan +100 calories and is following her eating plan approximately 25 % of the time. She states she is exercising by doing spin class for 45 minutes 1 times per week. Rhonda [name] reports that she continues to eat out and stops at [employer] almost daily. She is not getting all the protein in on the plan.  Her weight is 251 lb (113.9 kg) today and has had a weight gain of 2 pounds over a period of 3 weeks since her last visit. She has lost 22 lbs since starting treatment with [name].  Insulin Resistance Rhonda [name] has a diagnosis of insulin resistance based on her elevated fasting insulin level >5. Although Rhonda [name]'s blood glucose readings are still under good control, insulin resistance puts her at greater risk of metabolic syndrome and diabetes. She is taking metformin currently and continues to work on diet and exercise to decrease risk of diabetes. She denies nausea, vomiting, diarrhea, and polyphagia.  Depression with emotional eating behaviors Rhonda [name] is struggling with emotional eating and using food for comfort to the extent that it is negatively impacting her health. She often snacks when she is not hungry. Rhonda [name] sometimes feels she is out of control and then feels guilty that she made poor food choices. She has been working on behavior modification techniques to help reduce her emotional eating and has been minimally successful. She is eating out and going to [facility] when feeling sad. She shows no sign of suicidal or homicidal ideations.  Depression screen PHQ 2/9 01/09/2018  Decreased Interest 2  Down, Depressed, Hopeless 1  PHQ - 2 Score 3  Altered sleeping 1  Tired, decreased energy 3  Change in appetite 1  Feeling bad or failure about yourself  2  Trouble concentrating 2  Moving slowly or  fidgety/restless 1  Suicidal thoughts 0  PHQ-9 Score 13  Difficult doing work/chores Somewhat difficult   At risk for diabetes Rhonda [name] is at higher than averagerisk for developing diabetes due to her obesity. She currently denies polyuria or polydipsia.  ALLERGIES: No Known Allergies  MEDICATIONS: Current Outpatient Medications on File Prior to Visit  Medication Sig Dispense Refill  . buPROPion (WELLBUTRIN XL) 300 MG 24 hr tablet Take 300 mg by mouth daily.    Marland Kitchen ELDERBERRY PO Take by mouth.    . levonorgestrel (MIRENA) 20 MCG/24HR IUD 1 each by Intrauterine route once.    . nitrofurantoin, macrocrystal-monohydrate, (MACROBID) 100 MG capsule Take 1 capsule (100 mg total) by mouth 2 (two) times daily. 10 capsule 0  . Prenatal Vit-Fe Fumarate-FA (PRENATAL MULTIVITAMIN) TABS tablet Take 1 tablet by mouth daily at 12 noon.    Marland Kitchen spironolactone (ALDACTONE) 25 MG tablet Take 100 mg by mouth daily.     . Vitamin D, Ergocalciferol, (DRISDOL) 1.25 MG (50000 UT) CAPS capsule Take 1 capsule (50,000 Units total) by mouth every 7 (seven) days. 4 capsule 0   No current facility-administered medications on file prior to visit.     PAST MEDICAL HISTORY: Past Medical History:  Diagnosis Date  . Depression    treated  . PCOS (polycystic ovarian syndrome) 2008  . Plantar fasciitis     PAST SURGICAL HISTORY: Past Surgical History:  Procedure Laterality Date  . CHOLECYSTECTOMY      SOCIAL HISTORY: Social History  Tobacco Use  . Smoking status: Never Smoker  . Smokeless tobacco: Never Used  Substance Use Topics  . Alcohol use: No  . Drug use: No    FAMILY HISTORY: Family History  Problem Relation Age of Onset  . High blood pressure Mother   . Depression Father   . Liver disease Father   . Alcoholism Father   . Diabetes Paternal Grandmother     ROS: Review of Systems  Constitutional: Negative for weight loss.  Gastrointestinal: Negative for diarrhea, nausea and vomiting.    Endo/Heme/Allergies:       Negative for polyphagia  Psychiatric/Behavioral: Positive for depression. Negative for suicidal ideas.       Negative for homicidal     PHYSICAL EXAM: Blood pressure 119/82, pulse 74, temperature 97.7 F (36.5 C), temperature source Oral, height 5\' 10"  (1.778 m), weight 251 lb (113.9 kg), SpO2 96 %, unknown if currently breastfeeding. Body mass index is 36.01 kg/m. Physical Exam  Constitutional: She is oriented to person, place, and time. She appears well-developed and well-nourished.  HENT:  Head: Normocephalic.  Eyes: Pupils are equal, round, and reactive to light.  Neck: Normal range of motion.  Cardiovascular: Normal rate.  Pulmonary/Chest: Effort normal.  Musculoskeletal: Normal range of motion.  Neurological: She is alert and oriented to person, place, and time.  Skin: Skin is warm and dry.  Psychiatric: She has a normal mood and affect. Her behavior is normal.  Vitals reviewed.   RECENT LABS AND TESTS: BMET    Component Value Date/Time   NA 139 01/09/2018 1116   NA 142 08/06/2014 0215   K 4.5 01/09/2018 1116   K 3.5 08/06/2014 0215   CL 104 01/09/2018 1116   CL 108 (H) 08/06/2014 0215   CO2 23 01/09/2018 1116   CO2 20 (L) 08/06/2014 0215   GLUCOSE 84 01/09/2018 1116   GLUCOSE 74 09/18/2016 1804   GLUCOSE 113 (H) 08/06/2014 0215   BUN 11 01/09/2018 1116   BUN 15 08/06/2014 0215   CREATININE 0.89 01/09/2018 1116   CREATININE 1.00 08/06/2014 0215   CALCIUM 9.2 01/09/2018 1116   CALCIUM 9.3 08/06/2014 0215   GFRNONAA 86 01/09/2018 1116   GFRNONAA >60 08/06/2014 0215   GFRAA 99 01/09/2018 1116   GFRAA >60 08/06/2014 0215   Lab Results  Component Value Date   HGBA1C 5.2 01/09/2018   Lab Results  Component Value Date   INSULIN 16.5 01/09/2018   CBC    Component Value Date/Time   WBC 4.7 01/09/2018 1116   WBC 7.8 09/20/2016 0621   RBC 4.75 01/09/2018 1116   RBC 3.81 09/20/2016 0621   HGB 14.5 01/09/2018 1116   HCT 42.1  01/09/2018 1116   PLT 195 09/20/2016 0621   PLT 300 08/06/2014 0215   MCV 89 01/09/2018 1116   MCV 92 08/06/2014 0215   MCH 30.5 01/09/2018 1116   MCH 31.4 09/20/2016 0621   MCHC 34.4 01/09/2018 1116   MCHC 35.2 09/20/2016 0621   RDW 13.7 01/09/2018 1116   RDW 13.3 08/06/2014 0215   LYMPHSABS 1.3 01/09/2018 1116   LYMPHSABS 0.4 (L) 08/06/2014 0215   MONOABS 0.7 08/06/2014 0215   EOSABS 0.1 01/09/2018 1116   EOSABS 0.1 08/06/2014 0215   BASOSABS 0.0 01/09/2018 1116   BASOSABS 0.2 (H) 08/06/2014 0215   Iron/TIBC/Ferritin/ %Sat No results found for: IRON, TIBC, FERRITIN, IRONPCTSAT Lipid Panel     Component Value Date/Time   CHOL 153 01/09/2018 1116   TRIG 63  01/09/2018 1116   HDL 43 01/09/2018 1116   LDLCALC 97 01/09/2018 1116   Hepatic Function Panel     Component Value Date/Time   PROT 7.3 01/09/2018 1116   PROT 8.5 (H) 08/06/2014 0215   ALBUMIN 4.9 01/09/2018 1116   ALBUMIN 4.3 08/06/2014 0215   AST 33 01/09/2018 1116   AST 33 08/06/2014 0215   ALT 55 (H) 01/09/2018 1116   ALT 58 08/06/2014 0215   ALKPHOS 102 01/09/2018 1116   ALKPHOS 115 08/06/2014 0215   BILITOT 0.5 01/09/2018 1116   BILITOT 0.7 08/06/2014 0215      Component Value Date/Time   TSH 1.470 01/09/2018 1116    ASSESSMENT AND PLAN: Insulin resistance - Plan: metFORMIN (GLUCOPHAGE) 500 MG tablet  Other depression - with emotional eating  At risk for diabetes mellitus  Class 2 severe obesity with serious comorbidity and body mass index (BMI) of 36.0 to 36.9 in adult, unspecified obesity type ([place])  PLAN: Insulin Resistance Charmelle will continue to work on weight loss, exercise, and decreasing simple carbohydrates in her diet to help decrease the risk of diabetes. We dicussed metformin including benefits and risks. She was informed that eating too many simple carbohydrates or too many calories at one sitting increases the likelihood of GI side effects. Karolee agrees to continue metformin 500 mg  daily #30 with no refill. Sinthia agreed to follow up with [name] as directed to monitor her progress.  Depression with Emotional Eating Behaviors We discussed behavior modification techniques today to help Krystine deal with her emotional eating and depression. She has agreed to take Wellbutrin SR 150 mg qd and agreed to follow up as directed. We will refer to Dr. Mallie Mussel.   Diabetes risk counseling Deriyah was given extended (15 minutes) diabetes prevention counseling today. She is 32 y.o. female and has risk factors for diabetes including obesity. We discussed intensive lifestyle modifications today with an emphasis on weight loss as well as increasing exercise and decreasing simple carbohydrates in her diet.  Obesity Addysen is currently in the action stage of change. As such, her goal is to continue with weight loss efforts She has agreed to follow the Category 2 plan +100 calories.  Marena has been instructed to work up to a goal of 150 minutes of combined cardio and strengthening exercise per week for weight loss and overall health benefits. We discussed the following Behavioral Modification Strategies today: work on meal planning and easy cooking plans and keeping healthy foods in the home.    Yanisa has agreed to follow up with our clinic in 3 weeks. She was informed of the importance of frequent follow up visits to maximize her success with intensive lifestyle modifications for her multiple health conditions.   OBESITY BEHAVIORAL INTERVENTION VISIT  Today's visit was # 10   Starting weight: 273 lb Starting date: 01/09/18 Today's weight : Weight: 251 lb (113.9 kg)  Today's date: 08/15/18 Total lbs lost to date: 22 lb At least 15 minutes were spent on discussing the following behavioral intervention visit.   ASK: We discussed the diagnosis of obesity with Karma Greaser today and Nitzia agreed to give [name] permission to discuss obesity behavioral modification therapy  today.  ASSESS: Shardea has the diagnosis of obesity and her BMI today is 36.01 Louann is in the action stage of change   ADVISE: Shadaya was educated on the multiple health risks of obesity as well as the benefit of weight loss to improve her health. She was  advised of the need for long term treatment and the importance of lifestyle modifications to improve her current health and to decrease her risk of future health problems.  AGREE: Multiple dietary modification options and treatment options were discussed and  Avaeh agreed to follow the recommendations documented in the above note.  ARRANGE: Lineth was educated on the importance of frequent visits to treat obesity as outlined per CMS and USPSTF guidelines and agreed to schedule her next follow up appointment today.  Leary Roca, am acting as transcriptionist for Abby Potash, PA-C I, Abby Potash, PA-C have reviewed above note and agree with its content

## 2018-08-05 ENCOUNTER — Encounter (INDEPENDENT_AMBULATORY_CARE_PROVIDER_SITE_OTHER): Payer: Self-pay | Admitting: Physician Assistant

## 2018-08-05 ENCOUNTER — Ambulatory Visit (INDEPENDENT_AMBULATORY_CARE_PROVIDER_SITE_OTHER): Payer: 59 | Admitting: Psychology

## 2018-08-05 ENCOUNTER — Encounter (INDEPENDENT_AMBULATORY_CARE_PROVIDER_SITE_OTHER): Payer: Self-pay

## 2018-08-05 ENCOUNTER — Ambulatory Visit (INDEPENDENT_AMBULATORY_CARE_PROVIDER_SITE_OTHER): Payer: 59 | Admitting: Physician Assistant

## 2018-08-05 VITALS — BP 104/69 | HR 76 | Temp 97.6°F | Ht 70.0 in | Wt 250.0 lb

## 2018-08-05 DIAGNOSIS — Z6836 Body mass index (BMI) 36.0-36.9, adult: Secondary | ICD-10-CM | POA: Diagnosis not present

## 2018-08-05 DIAGNOSIS — Z9189 Other specified personal risk factors, not elsewhere classified: Secondary | ICD-10-CM | POA: Diagnosis not present

## 2018-08-05 DIAGNOSIS — E559 Vitamin D deficiency, unspecified: Secondary | ICD-10-CM

## 2018-08-05 DIAGNOSIS — E8881 Metabolic syndrome: Secondary | ICD-10-CM

## 2018-08-05 MED ORDER — VITAMIN D (ERGOCALCIFEROL) 1.25 MG (50000 UNIT) PO CAPS: 50000.0000 [IU] | ORAL_CAPSULE | ORAL | 0 refills | Status: DC

## 2018-08-05 MED ORDER — METFORMIN HCL 500 MG PO TABS: 500.0000 mg | ORAL_TABLET | Freq: Every day | ORAL | 0 refills | Status: DC

## 2018-08-06 NOTE — Progress Notes (Signed)
Office: [phone]  /  Fax: [phone]   HPI:   Chief Complaint: OBESITY Rhonda [name] is here to discuss her progress with her obesity treatment plan. She is on the Category 2 plan + 100 calories and is following her eating plan approximately 60 % of the time. She states she is exercising 0 minutes 0 times per week. Rhonda [name] is doing well with weight loss. 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 She has also been stopping less at fast food restaurants. Her weight is 250 lb (113.4 kg) today and has had a weight loss of 1 pound over a period of 3 weeks since her last visit. She has lost 23 lbs since starting treatment with [name].  Insulin Resistance Rhonda [name] has a diagnosis of insulin resistance based on her elevated fasting insulin level >5. Although Rhonda [name]'s blood glucose readings are still under good control, insulin resistance puts her at greater risk of metabolic syndrome and diabetes. She is on metformin and denies nausea, vomiting, or diarrhea. She continues to work on diet and exercise to decrease risk of diabetes. She denies polyphagia or hypoglycemia.  At risk for diabetes Rhonda [name] is at higher than average risk for developing diabetes due to her obesity. She currently denies polyuria or polydipsia.  Vitamin D Deficiency Rhonda [name] has a diagnosis of vitamin D deficiency. She is currently taking prescription Vit D and denies nausea, vomiting or muscle weakness.  ASSESSMENT AND PLAN:  Insulin resistance - Plan: metFORMIN (GLUCOPHAGE) 500 MG tablet  Vitamin D deficiency - Plan: Vitamin D, Ergocalciferol, (DRISDOL) 1.25 MG (50000 UT) CAPS capsule  At risk for diabetes mellitus  Class 2 severe obesity with serious comorbidity and body mass index (BMI) of 36.0 to 36.9 in adult, unspecified obesity type ([place])  PLAN:  Insulin Resistance Rhonda [name] will continue to work on weight loss, exercise, and decreasing simple carbohydrates in her diet to help  decrease the risk of diabetes. We dicussed metformin including benefits and risks. She was informed that eating too many simple carbohydrates or too many calories at one sitting increases the likelihood of GI side effects. Rhonda [name] agrees to continue taking metformin 500 mg q AM #30 and we will refill for 1 month. Rhonda [name] agrees to follow up with our clinic in 3 weeks as directed to monitor her progress.  Diabetes risk counseling Rhonda [name] was given extended (15 minutes) diabetes prevention counseling today. She is 32 y.o. female and has risk factors for diabetes including obesity. We discussed intensive lifestyle modifications today with an emphasis on weight loss as well as increasing exercise and decreasing simple carbohydrates in her diet.  Vitamin D Deficiency Rhonda [name] was informed that low vitamin D levels contributes to fatigue and are associated with obesity, breast, and colon cancer. Rhonda [name] agrees to continue taking prescription Vit D @50 ,000 IU every week #4 and we will refill for 1 month. She will follow up for routine testing of vitamin D, at least 2-3 times per year. She was informed of the risk of over-replacement of vitamin D and agrees to not increase her dose unless she discusses this with [name] first. Rhonda [name] agrees to follow up with our clinic in 3 weeks.  Obesity Rhonda [name] is currently in the action stage of change. As such, her goal is to continue with weight loss efforts She has agreed to follow the Category 2 plan Rhonda [name] has been instructed to work up to a goal of 150 minutes of combined cardio and strengthening exercise per  week for weight loss and overall health benefits. We discussed the following Behavioral Modification Strategies today: increasing lean protein intake and work on meal planning and easy cooking plans  Rhonda [name] has agreed to follow up with our clinic in 3 weeks. She was informed of the importance of frequent follow up visits to maximize her success with intensive  lifestyle modifications for her multiple health conditions.  ALLERGIES: No Known Allergies  MEDICATIONS: Current Outpatient Medications on File Prior to Visit  Medication Sig Dispense Refill  . buPROPion (WELLBUTRIN XL) 300 MG 24 hr tablet Take 300 mg by mouth daily.    Rhonda [name] ELDERBERRY PO Take by mouth.    . Rhonda [name] (MIRENA) 20 MCG/24HR IUD 1 each by Intrauterine route once.    . Rhonda [name] (Rhonda MULTIVITAMIN) TABS tablet Take 1 tablet by mouth daily at 12 noon.    Rhonda [name] spironolactone (ALDACTONE) 25 MG tablet Take 100 mg by mouth daily.      No current facility-administered medications on file prior to visit.     PAST MEDICAL HISTORY: Past Medical History:  Diagnosis Date  . Depression    treated  . PCOS (polycystic ovarian syndrome) 2008  . Plantar fasciitis     PAST SURGICAL HISTORY: Past Surgical History:  Procedure Laterality Date  . CHOLECYSTECTOMY      SOCIAL HISTORY: Social History   Tobacco Use  . Smoking status: Never Smoker  . Smokeless tobacco: Never Used  Substance Use Topics  . Alcohol use: No  . Drug use: No    FAMILY HISTORY: Family History  Problem Relation Age of Onset  . High blood pressure Mother   . Depression Father   . Liver disease Father   . Alcoholism Father   . Diabetes Paternal Grandmother     ROS: Review of Systems  Constitutional: Positive for weight loss.  Gastrointestinal: Negative for diarrhea, nausea and vomiting.  Genitourinary: Negative for frequency.  Musculoskeletal:       Negative for muscle weakness  Endo/Heme/Allergies: Negative for polydipsia.       Negative for polyphagia Negative for hypoglycemia     PHYSICAL EXAM: Blood pressure 104/69, pulse 76, temperature 97.6 F (36.4 C), temperature source Oral, height 5\' 10"  (1.778 m), weight 250 lb (113.4 kg), SpO2 97 %, unknown if currently breastfeeding. Body mass index is 35.87 kg/m. Physical Exam Vitals signs reviewed.  Constitutional:       Appearance: Normal appearance. She is obese.  Cardiovascular:     Rate and Rhythm: Normal rate.     Pulses: Normal pulses.  Pulmonary:     Effort: Pulmonary effort is normal.  Musculoskeletal: Normal range of motion.  Skin:    General: Skin is warm and dry.  Neurological:     Mental Status: She is alert and oriented to person, place, and time.  Psychiatric:        Mood and Affect: Mood normal.        Behavior: Behavior normal.     RECENT LABS AND TESTS: BMET    Component Value Date/Time   NA 139 01/09/2018 1116   NA 142 08/06/2014 0215   K 4.5 01/09/2018 1116   K 3.5 08/06/2014 0215   CL 104 01/09/2018 1116   CL 108 (H) 08/06/2014 0215   CO2 23 01/09/2018 1116   CO2 20 (L) 08/06/2014 0215   GLUCOSE 84 01/09/2018 1116   GLUCOSE 74 09/18/2016 1804   GLUCOSE 113 (H) 08/06/2014 0215   BUN 11 01/09/2018 1116  BUN 15 08/06/2014 0215   CREATININE 0.89 01/09/2018 1116   CREATININE 1.00 08/06/2014 0215   CALCIUM 9.2 01/09/2018 1116   CALCIUM 9.3 08/06/2014 0215   GFRNONAA 86 01/09/2018 1116   GFRNONAA >60 08/06/2014 0215   GFRAA 99 01/09/2018 1116   GFRAA >60 08/06/2014 0215   Lab Results  Component Value Date   HGBA1C 5.2 01/09/2018   Lab Results  Component Value Date   INSULIN 16.5 01/09/2018   CBC    Component Value Date/Time   WBC 4.7 01/09/2018 1116   WBC 7.8 09/20/2016 0621   RBC 4.75 01/09/2018 1116   RBC 3.81 09/20/2016 0621   HGB 14.5 01/09/2018 1116   HCT 42.1 01/09/2018 1116   PLT 195 09/20/2016 0621   PLT 300 08/06/2014 0215   MCV 89 01/09/2018 1116   MCV 92 08/06/2014 0215   MCH 30.5 01/09/2018 1116   MCH 31.4 09/20/2016 0621   MCHC 34.4 01/09/2018 1116   MCHC 35.2 09/20/2016 0621   RDW 13.7 01/09/2018 1116   RDW 13.3 08/06/2014 0215   LYMPHSABS 1.3 01/09/2018 1116   LYMPHSABS 0.4 (L) 08/06/2014 0215   MONOABS 0.7 08/06/2014 0215   EOSABS 0.1 01/09/2018 1116   EOSABS 0.1 08/06/2014 0215   BASOSABS 0.0 01/09/2018 1116   BASOSABS 0.2  (H) 08/06/2014 0215   Iron/TIBC/Ferritin/ %Sat No results found for: IRON, TIBC, FERRITIN, IRONPCTSAT Lipid Panel     Component Value Date/Time   CHOL 153 01/09/2018 1116   TRIG 63 01/09/2018 1116   HDL 43 01/09/2018 1116   LDLCALC 97 01/09/2018 1116   Hepatic Function Panel     Component Value Date/Time   PROT 7.3 01/09/2018 1116   PROT 8.5 (H) 08/06/2014 0215   ALBUMIN 4.9 01/09/2018 1116   ALBUMIN 4.3 08/06/2014 0215   AST 33 01/09/2018 1116   AST 33 08/06/2014 0215   ALT 55 (H) 01/09/2018 1116   ALT 58 08/06/2014 0215   ALKPHOS 102 01/09/2018 1116   ALKPHOS 115 08/06/2014 0215   BILITOT 0.5 01/09/2018 1116   BILITOT 0.7 08/06/2014 0215      Component Value Date/Time   TSH 1.470 01/09/2018 1116    Ref. Range 01/09/2018 11:16  Vitamin D, 25-Hydroxy Latest Ref Range: 30.0 - 100.0 ng/mL 25.2 (L)     OBESITY BEHAVIORAL INTERVENTION VISIT  Today's visit was # 11   Starting weight: 273 lbs Starting date: 01/09/18 Today's weight : 250 lbs Today's date: 08/05/2018 Total lbs lost to date: 25   ASK: We discussed the diagnosis of obesity with Karma Greaser today and Jaleigh agreed to give [name] permission to discuss obesity behavioral modification therapy today.  ASSESS: Deema has the diagnosis of obesity and her BMI today is 35.87 Lamyra is in the action stage of change   ADVISE: Magdelyn was educated on the multiple health risks of obesity as well as the benefit of weight loss to improve her health. She was advised of the need for long term treatment and the importance of lifestyle modifications to improve her current health and to decrease her risk of future health problems.  AGREE: Multiple dietary modification options and treatment options were discussed and  Erynne agreed to follow the recommendations documented in the above note.  ARRANGE: Laresha was educated on the importance of frequent visits to treat obesity as outlined per CMS and USPSTF guidelines  and agreed to schedule her next follow up appointment today.  I, Tammy wysor, am acting as transcriptionist for  Abby Potash, PA-C I, Abby Potash, PA-C have reviewed above note and agree with its content

## 2018-08-26 ENCOUNTER — Telehealth: Payer: No Typology Code available for payment source | Admitting: Physician Assistant

## 2018-08-26 DIAGNOSIS — L282 Other prurigo: Secondary | ICD-10-CM | POA: Diagnosis not present

## 2018-08-26 MED ORDER — CLOTRIMAZOLE-BETAMETHASONE 1-0.05 % EX CREA: 1.0000 "application " | TOPICAL_CREAM | Freq: Two times a day (BID) | CUTANEOUS | 0 refills | Status: DC

## 2018-08-26 NOTE — Progress Notes (Signed)
E Visit for Rash  We are sorry that you are not feeling well. Here is how we plan to help!         Based upon your presentation it appears you have a fungal infection.  I have prescribed: Lotrisone which combines a steroid with an antifungal.  I will expect your rash to feel better with 24 hours and may resolve within one week if you use the cream exactly as prescribed.    HOME CARE:  Take cool showers and avoid direct sunlight. Apply cool compress or wet dressings. Take a bath in an oatmeal bath.  Sprinkle content of one Aveeno packet under running faucet with comfortably warm water.  Bathe for 15-20 minutes, 1-2 times daily.  Pat dry with a towel. Do not rub the rash. Use hydrocortisone cream. Take an antihistamine like Benadryl for widespread rashes that itch.  The adult dose of Benadryl is 25-50 mg by mouth 4 times daily. Caution:  This type of medication may cause sleepiness.  Do not drink alcohol, drive, or operate dangerous machinery while taking antihistamines.  Do not take these medications if you have prostate enlargement.  Read package instructions thoroughly on all medications that you take.  GET HELP RIGHT AWAY IF:  Symptoms don't go away after treatment. Severe itching that persists. If you rash spreads or swells. If you rash begins to smell. If it blisters and opens or develops a yellow-brown crust. You develop a fever. You have a sore throat. You become short of breath.  MAKE SURE YOU:  Understand these instructions. Will watch your condition. Will get help right away if you are not doing well or get worse.  Thank you for choosing an e-visit. Your e-visit answers were reviewed by a board certified advanced clinical practitioner to complete your personal care plan. Depending upon the condition, your plan could have included both over the counter or prescription medications. Please review your pharmacy choice. Be sure that the pharmacy you have chosen is open  so that you can pick up your prescription now.  If there is a problem you may message your provider in [place] to have the prescription routed to another pharmacy. Your safety is important to [name]. If you have drug allergies check your prescription carefully.  For the next 24 hours, you can use MyChart to ask questions about today's visit, request a non-urgent call back, or ask for a work or school excuse from your e-visit provider. You will get an email in the next two days asking about your experience. I hope that your e-visit has been valuable and will speed your recovery.      ===View-only below this line===   ----- Message -----    From: Karma Greaser    Sent: 08/26/2018 10:33 AM EST      To: E-Visit Mailing List Subject: E-Visit Submission: Rash  E-Visit Submission: Rash --------------------------------  Question: Where is your rash located? (check all that apply) Answer:   Legs or arms  Question: Is the rash is an area that tends to be moist or sweaty? Answer:   Yes  Question: Rash location "other"/comments: Answer:   Left groin. Appears to be yeast  Question: Are you able to attach a photo? Please do if possible Answer:   Yes  Question: Do you have a fever? Answer:   No  Question: Does your rash itch? Answer:   Yes  Question: Did the rash appear after shaving or other skin irritation? Answer:   No  Question: Is the rash painful? Answer:   No  Question: When did your symptoms start? Answer:   1 week ago  Question: Did your rash appear after sun exposure? Answer:   No  Question: Have you had a tick bite in the last 2 weeks? Answer:   No  Question: Did your rash appear shortly after eating a new food? Answer:   No  Question: Other new products used/comments: Answer:     Question: Have you tried any of the following over-the-counter medications? Answer:   No  Question: Over the counter med "other"/comments: Answer:     Question: Did any of the  medications help? Answer:   No  Question: Are you experiencing any shortness of breath? Answer:   No  Question: Please list your medication allergies that you may have ? (If 'none' , please list as 'none') Answer:   None  Question: Please list any additional comments  Answer:     Question: Are you pregnant? Answer:   I am confident that I am not pregnant  Question: Are you breastfeeding? Answer:   No

## 2018-08-28 ENCOUNTER — Ambulatory Visit (INDEPENDENT_AMBULATORY_CARE_PROVIDER_SITE_OTHER): Payer: Self-pay | Admitting: Physician Assistant

## 2018-09-09 ENCOUNTER — Other Ambulatory Visit (INDEPENDENT_AMBULATORY_CARE_PROVIDER_SITE_OTHER): Payer: Self-pay | Admitting: Physician Assistant

## 2018-09-09 DIAGNOSIS — E559 Vitamin D deficiency, unspecified: Secondary | ICD-10-CM

## 2018-09-09 DIAGNOSIS — E8881 Metabolic syndrome: Secondary | ICD-10-CM

## 2018-09-10 MED ORDER — METFORMIN HCL 500 MG PO TABS: 500.0000 mg | ORAL_TABLET | Freq: Every day | ORAL | 0 refills | Status: DC

## 2018-09-10 MED ORDER — VITAMIN D (ERGOCALCIFEROL) 1.25 MG (50000 UNIT) PO CAPS: 50000.0000 [IU] | ORAL_CAPSULE | ORAL | 0 refills | Status: DC

## 2018-09-25 ENCOUNTER — Ambulatory Visit (INDEPENDENT_AMBULATORY_CARE_PROVIDER_SITE_OTHER): Payer: Self-pay | Admitting: Physician Assistant

## 2018-09-25 ENCOUNTER — Encounter (INDEPENDENT_AMBULATORY_CARE_PROVIDER_SITE_OTHER): Payer: Self-pay

## 2018-10-06 ENCOUNTER — Encounter (INDEPENDENT_AMBULATORY_CARE_PROVIDER_SITE_OTHER): Payer: Self-pay | Admitting: Physician Assistant

## 2018-10-06 ENCOUNTER — Ambulatory Visit (INDEPENDENT_AMBULATORY_CARE_PROVIDER_SITE_OTHER): Payer: No Typology Code available for payment source | Admitting: Physician Assistant

## 2018-10-06 VITALS — BP 107/73 | HR 78 | Temp 98.4°F | Ht 70.0 in | Wt 258.0 lb

## 2018-10-06 DIAGNOSIS — Z6837 Body mass index (BMI) 37.0-37.9, adult: Secondary | ICD-10-CM

## 2018-10-06 DIAGNOSIS — E559 Vitamin D deficiency, unspecified: Secondary | ICD-10-CM

## 2018-10-06 DIAGNOSIS — E8881 Metabolic syndrome: Secondary | ICD-10-CM

## 2018-10-06 DIAGNOSIS — Z9189 Other specified personal risk factors, not elsewhere classified: Secondary | ICD-10-CM

## 2018-10-06 NOTE — Progress Notes (Signed)
Office: [phone]  /  Fax: [phone]   HPI:   Chief Complaint: OBESITY Rhonda [name] is here to discuss her progress with her obesity treatment plan. She is on the Category 2 plan and is following her eating plan approximately 25% of the time. She states she is exercising 0 minutes 0 times per week. Neriah reports that she has been eating out almost daily and is not following the plan. She is ready to get back on track. Her weight is 258 lb (117 kg) today and has had a weight gain of 8 pounds since her last visit. She has lost 15 lbs since starting treatment with [name].  Vitamin D deficiency Rhonda [name] has a diagnosis of Vitamin D deficiency. She is currently taking Vit D and denies nausea, vomiting or muscle weakness.  Insulin Resistance Rhonda [name] has a diagnosis of insulin resistance based on her elevated fasting insulin level >5. Although Rhonda [name]'s blood glucose readings are still under good control, insulin resistance puts her at greater risk of metabolic syndrome and diabetes. She is taking metformin currently and continues to work on diet and exercise to decrease risk of diabetes. Denies nausea, vomiting, diarrhea, or polyphagia.  At risk for diabetes Rhonda [name] is at higher than averagerisk for developing diabetes due to her obesity. She currently denies polyuria or polydipsia.  ASSESSMENT AND PLAN:  Vitamin D deficiency - Plan: VITAMIN D 25 Hydroxy (Vit-D Deficiency, Fractures)  Insulin resistance - Plan: Comprehensive metabolic panel, Hemoglobin A1c, Insulin, random  At risk for diabetes mellitus  Class 2 severe obesity with serious comorbidity and body mass index (BMI) of 37.0 to 37.9 in adult, unspecified obesity type ([place])  PLAN:  Vitamin D Deficiency Rhonda [name] was informed that low Vitamin D levels contributes to fatigue and are associated with obesity, breast, and colon cancer. Rhonda [name] agrees to continue to take Vit D and will have labs drawn today. She was informed of the risk  of over-replacement of Vitamin D and agrees to not increase her dose unless she discusses this with [name] first. Rhonda [name] agrees to follow-up with our clinic in 2 weeks.  Insulin Resistance Rhonda [name] will continue to work on weight loss, exercise, and decreasing simple carbohydrates in her diet to help decrease the risk of diabetes. We dicussed metformin including benefits and risks. She was informed that eating too many simple carbohydrates or too many calories at one sitting increases the likelihood of GI side effects. Rhonda [name] will continue metformin for now and prescription was not written today. We will check labs today. Rhonda [name] agreed to follow-up with [name] as directed to monitor her progress.  Diabetes risk counseling Rhonda [name] was given extended (15 minutes) diabetes prevention counseling today. She is a 33 y.o. female and has risk factors for diabetes including obesity. We discussed intensive lifestyle modifications today with an emphasis on weight loss as well as increasing exercise and decreasing simple carbohydrates in her diet.  Obesity Rhonda [name] is currently in the action stage of change. As such, her goal is to continue with weight loss efforts. She has agreed to change meal plans and will keep a food journal with 1200 calories and 85 grams of protein daily.  Rhonda [name] has been instructed to work up to a goal of 150 minutes of combined cardio and strengthening exercise per week for weight loss and overall health benefits. We discussed the following Behavioral Modification Strategies today: decrease eating out and work on meal planning and easy cooking plans.  Rhonda [name] has agreed to follow-up with our clinic in 2-3  weeks. She was informed of the importance of frequent follow up visits to maximize her success with intensive lifestyle modifications for her multiple health conditions.  ALLERGIES: No Known Allergies  MEDICATIONS: Current Outpatient Medications on File Prior to Visit  Medication Sig  Dispense Refill  . buPROPion (WELLBUTRIN XL) 300 MG 24 hr tablet Take 300 mg by mouth daily.    . clotrimazole-betamethasone (LOTRISONE) cream Apply 1 application topically 2 (two) times daily. 30 g 0  . ELDERBERRY PO Take by mouth.    . levonorgestrel (MIRENA) 20 MCG/24HR IUD 1 each by Intrauterine route once.    . metFORMIN (GLUCOPHAGE) 500 MG tablet Take 1 tablet (500 mg total) by mouth daily with breakfast. 30 tablet 0  . Prenatal Vit-Fe Fumarate-FA (PRENATAL MULTIVITAMIN) TABS tablet Take 1 tablet by mouth daily at 12 noon.    Marland Kitchen spironolactone (ALDACTONE) 25 MG tablet Take 100 mg by mouth daily.     . Vitamin D, Ergocalciferol, (DRISDOL) 1.25 MG (50000 UT) CAPS capsule Take 1 capsule (50,000 Units total) by mouth every 7 (seven) days. 4 capsule 0   No current facility-administered medications on file prior to visit.     PAST MEDICAL HISTORY: Past Medical History:  Diagnosis Date  . Depression    treated  . PCOS (polycystic ovarian syndrome) 2008  . Plantar fasciitis     PAST SURGICAL HISTORY: Past Surgical History:  Procedure Laterality Date  . CHOLECYSTECTOMY      SOCIAL HISTORY: Social History   Tobacco Use  . Smoking status: Never Smoker  . Smokeless tobacco: Never Used  Substance Use Topics  . Alcohol use: No  . Drug use: No    FAMILY HISTORY: Family History  Problem Relation Age of Onset  . High blood pressure Mother   . Depression Father   . Liver disease Father   . Alcoholism Father   . Diabetes Paternal Grandmother    ROS: Review of Systems  Constitutional: Negative for weight loss.  Gastrointestinal: Negative for diarrhea, nausea and vomiting.  Genitourinary:       Negative for polyuria.  Musculoskeletal:       Negative for muscle weakness.  Endo/Heme/Allergies: Negative for polydipsia.       Negative for polyphagia. Negative for hypoglycemia.   PHYSICAL EXAM: Blood pressure 107/73, pulse 78, temperature 98.4 F (36.9 C), height 5\' 10"   (1.778 m), weight 258 lb (117 kg), SpO2 98 %, unknown if currently breastfeeding. Body mass index is 37.02 kg/m. Physical Exam Vitals signs reviewed.  Constitutional:      Appearance: Normal appearance. She is obese.  Cardiovascular:     Rate and Rhythm: Normal rate.     Pulses: Normal pulses.  Pulmonary:     Effort: Pulmonary effort is normal.     Breath sounds: Normal breath sounds.  Musculoskeletal: Normal range of motion.  Skin:    General: Skin is warm and dry.  Neurological:     Mental Status: She is alert and oriented to person, place, and time.  Psychiatric:        Behavior: Behavior normal.   RECENT LABS AND TESTS: BMET    Component Value Date/Time   NA 139 01/09/2018 1116   NA 142 08/06/2014 0215   K 4.5 01/09/2018 1116   K 3.5 08/06/2014 0215   CL 104 01/09/2018 1116   CL 108 (H) 08/06/2014 0215   CO2 23 01/09/2018 1116   CO2 20 (L) 08/06/2014 0215   GLUCOSE 84 01/09/2018 1116  GLUCOSE 74 09/18/2016 1804   GLUCOSE 113 (H) 08/06/2014 0215   BUN 11 01/09/2018 1116   BUN 15 08/06/2014 0215   CREATININE 0.89 01/09/2018 1116   CREATININE 1.00 08/06/2014 0215   CALCIUM 9.2 01/09/2018 1116   CALCIUM 9.3 08/06/2014 0215   GFRNONAA 86 01/09/2018 1116   GFRNONAA >60 08/06/2014 0215   GFRAA 99 01/09/2018 1116   GFRAA >60 08/06/2014 0215   Lab Results  Component Value Date   HGBA1C 5.2 01/09/2018   Lab Results  Component Value Date   INSULIN 16.5 01/09/2018   CBC    Component Value Date/Time   WBC 4.7 01/09/2018 1116   WBC 7.8 09/20/2016 0621   RBC 4.75 01/09/2018 1116   RBC 3.81 09/20/2016 0621   HGB 14.5 01/09/2018 1116   HCT 42.1 01/09/2018 1116   PLT 195 09/20/2016 0621   PLT 300 08/06/2014 0215   MCV 89 01/09/2018 1116   MCV 92 08/06/2014 0215   MCH 30.5 01/09/2018 1116   MCH 31.4 09/20/2016 0621   MCHC 34.4 01/09/2018 1116   MCHC 35.2 09/20/2016 0621   RDW 13.7 01/09/2018 1116   RDW 13.3 08/06/2014 0215   LYMPHSABS 1.3 01/09/2018 1116    LYMPHSABS 0.4 (L) 08/06/2014 0215   MONOABS 0.7 08/06/2014 0215   EOSABS 0.1 01/09/2018 1116   EOSABS 0.1 08/06/2014 0215   BASOSABS 0.0 01/09/2018 1116   BASOSABS 0.2 (H) 08/06/2014 0215   Iron/TIBC/Ferritin/ %Sat No results found for: IRON, TIBC, FERRITIN, IRONPCTSAT Lipid Panel     Component Value Date/Time   CHOL 153 01/09/2018 1116   TRIG 63 01/09/2018 1116   HDL 43 01/09/2018 1116   LDLCALC 97 01/09/2018 1116   Hepatic Function Panel     Component Value Date/Time   PROT 7.3 01/09/2018 1116   PROT 8.5 (H) 08/06/2014 0215   ALBUMIN 4.9 01/09/2018 1116   ALBUMIN 4.3 08/06/2014 0215   AST 33 01/09/2018 1116   AST 33 08/06/2014 0215   ALT 55 (H) 01/09/2018 1116   ALT 58 08/06/2014 0215   ALKPHOS 102 01/09/2018 1116   ALKPHOS 115 08/06/2014 0215   BILITOT 0.5 01/09/2018 1116   BILITOT 0.7 08/06/2014 0215      Component Value Date/Time   TSH 1.470 01/09/2018 1116    Ref. Range 01/09/2018 11:16  Vitamin D, 25-Hydroxy Latest Ref Range: 30.0 - 100.0 ng/mL 25.2 (L)   OBESITY BEHAVIORAL INTERVENTION VISIT  Today's visit was #12  Starting weight: 273 lbs Starting date: 01/09/2018 Today's weight: 258  Today's date: 10/06/2018 Total lbs lost to date: 15  ASK: We discussed the diagnosis of obesity with Karma Greaser today and Amesha agreed to give [name] permission to discuss obesity behavioral modification therapy today.  ASSESS: Jessly has the diagnosis of obesity and her BMI today is 37.02. Echo is in the action stage of change.  ADVISE: Joeann was educated on the multiple health risks of obesity as well as the benefit of weight loss to improve her health. She was advised of the need for long term treatment and the importance of lifestyle modifications to improve her current health and to decrease her risk of future health problems.  AGREE: Multiple dietary modification options and treatment options were discussed and  [place] agreed to follow the  recommendations documented in the above note.  ARRANGE: Olina was educated on the importance of frequent visits to treat obesity as outlined per CMS and USPSTF guidelines and agreed to schedule her next follow  up appointment today.  Migdalia Dk, am acting as transcriptionist for Abby Potash, PA-C I, Abby Potash, PA-C have reviewed above note and agree with its content

## 2018-10-10 LAB — COMPREHENSIVE METABOLIC PANEL
A/G RATIO: 1.6 (ref 1.2–2.2)
ALK PHOS: 76 IU/L (ref 39–117)
ALT: 39 IU/L — AB (ref 0–32)
AST: 25 IU/L (ref 0–40)
Albumin: 4.7 g/dL (ref 3.8–4.8)
BILIRUBIN TOTAL: 0.5 mg/dL (ref 0.0–1.2)
BUN/Creatinine Ratio: 14 (ref 9–23)
BUN: 13 mg/dL (ref 6–20)
CHLORIDE: 102 mmol/L (ref 96–106)
CO2: 23 mmol/L (ref 20–29)
Calcium: 9.6 mg/dL (ref 8.7–10.2)
Creatinine, Ser: 0.95 mg/dL (ref 0.57–1.00)
GFR calc Af Amer: 91 mL/min/{1.73_m2} (ref >59)
GFR, EST NON AFRICAN AMERICAN: 79 mL/min/{1.73_m2} (ref >59)
GLOBULIN, TOTAL: 2.9 g/dL (ref 1.5–4.5)
Glucose: 91 mg/dL (ref 65–99)
POTASSIUM: 4.6 mmol/L (ref 3.5–5.2)
SODIUM: 139 mmol/L (ref 134–144)
Total Protein: 7.6 g/dL (ref 6.0–8.5)

## 2018-10-10 LAB — HEMOGLOBIN A1C
ESTIMATED AVERAGE GLUCOSE: 103 mg/dL
Hgb A1c MFr Bld: 5.2 % (ref 4.8–5.6)

## 2018-10-10 LAB — INSULIN, RANDOM: INSULIN: 13 u[IU]/mL (ref 2.6–24.9)

## 2018-10-10 LAB — VITAMIN D 25 HYDROXY (VIT D DEFICIENCY, FRACTURES): Vit D, 25-Hydroxy: 35.8 ng/mL (ref 30.0–100.0)

## 2018-10-20 ENCOUNTER — Encounter (INDEPENDENT_AMBULATORY_CARE_PROVIDER_SITE_OTHER): Payer: Self-pay | Admitting: Physician Assistant

## 2018-10-27 ENCOUNTER — Encounter (INDEPENDENT_AMBULATORY_CARE_PROVIDER_SITE_OTHER): Payer: Self-pay | Admitting: Bariatrics

## 2018-10-27 ENCOUNTER — Ambulatory Visit (INDEPENDENT_AMBULATORY_CARE_PROVIDER_SITE_OTHER): Payer: No Typology Code available for payment source | Admitting: Bariatrics

## 2018-10-27 VITALS — BP 121/80 | HR 76 | Temp 98.3°F | Ht 70.0 in | Wt 253.0 lb

## 2018-10-27 DIAGNOSIS — Z6836 Body mass index (BMI) 36.0-36.9, adult: Secondary | ICD-10-CM

## 2018-10-27 DIAGNOSIS — Z9189 Other specified personal risk factors, not elsewhere classified: Secondary | ICD-10-CM

## 2018-10-27 DIAGNOSIS — E8881 Metabolic syndrome: Secondary | ICD-10-CM | POA: Diagnosis not present

## 2018-10-27 DIAGNOSIS — E559 Vitamin D deficiency, unspecified: Secondary | ICD-10-CM

## 2018-10-27 MED ORDER — METFORMIN HCL 500 MG PO TABS: 500.0000 mg | ORAL_TABLET | Freq: Every day | ORAL | 0 refills | Status: DC

## 2018-10-27 MED ORDER — VITAMIN D (ERGOCALCIFEROL) 1.25 MG (50000 UNIT) PO CAPS: 50000.0000 [IU] | ORAL_CAPSULE | ORAL | 0 refills | Status: DC

## 2018-10-28 DIAGNOSIS — Z6836 Body mass index (BMI) 36.0-36.9, adult: Secondary | ICD-10-CM

## 2018-10-28 DIAGNOSIS — Z6839 Body mass index (BMI) 39.0-39.9, adult: Secondary | ICD-10-CM | POA: Insufficient documentation

## 2018-10-28 DIAGNOSIS — E559 Vitamin D deficiency, unspecified: Secondary | ICD-10-CM | POA: Insufficient documentation

## 2018-10-28 DIAGNOSIS — E8881 Metabolic syndrome: Secondary | ICD-10-CM | POA: Insufficient documentation

## 2018-10-28 NOTE — Progress Notes (Signed)
Office: [phone]  /  Fax: [phone]   HPI:   Chief Complaint: OBESITY Rhonda [name] is here to discuss her progress with her obesity treatment plan. She is on the keep a food journal with 1200 calories and 85 grams of protein daily plan and she is following her eating plan approximately 50 % of the time. She states she is doing spin for 60 minutes 1 time per week. Rhonda [name] has been doing well overall. She eats more than she should (more lunch). Rhonda [name] is using the "eating out" sheet. Her weight is 253 lb (114.8 kg) today and has had a weight loss of 5 pounds over a period of 3 weeks since her last visit. She has lost 20 lbs since starting treatment with [name].  Vitamin D deficiency Rhonda [name] has a diagnosis of vitamin D deficiency. Her last vitamin D level was at 35.8 She is currently taking vit D and denies nausea, vomiting or muscle weakness.  At risk for osteopenia and osteoporosis [name] is at higher risk of osteopenia and osteoporosis due to vitamin D deficiency.   Insulin Resistance [name] has a diagnosis of insulin resistance based on her elevated fasting insulin level >5. Although [name]'s blood glucose readings are still under good control, insulin resistance puts her at greater risk of metabolic syndrome and diabetes. Her last A1c was at 5.2 and last insulin level was at 13.0 She denies any GI side effects of metformin. [name] continues to work on diet and exercise to decrease risk of diabetes.  ASSESSMENT AND PLAN:  Vitamin D deficiency - Plan: Vitamin D, Ergocalciferol, (DRISDOL) 1.25 MG (50000 UT) CAPS capsule  Insulin resistance - Plan: metFORMIN (GLUCOPHAGE) 500 MG tablet  At risk for osteoporosis  Class 2 severe obesity with serious comorbidity and body mass index (BMI) of 36.0 to 36.9 in adult, unspecified obesity type ([place])  PLAN:  Vitamin D Deficiency [name] was informed that low vitamin D levels contributes to fatigue and are associated with obesity, breast,  and colon cancer. She agrees to continue to take prescription Vit D @50 ,000 IU every week #4 with no refills and will follow up for routine testing of vitamin D, at least 2-3 times per year. She was informed of the risk of over-replacement of vitamin D and agrees to not increase her dose unless she discusses this with [name] first. Rhonda [name] agrees to follow up as directed.  At risk for osteopenia and osteoporosis Rhonda [name] was given extended  (15 minutes) osteoporosis prevention counseling today. [name] is at risk for osteopenia and osteoporosis due to her vitamin D deficiency. She was encouraged to take her vitamin D and follow her higher calcium diet and increase strengthening exercise to help strengthen her bones and decrease her risk of osteopenia and osteoporosis.  Insulin Resistance Rhonda [name] will continue to work on weight loss, exercise, and decreasing simple carbohydrates in her diet to help decrease the risk of diabetes. We dicussed metformin including benefits and risks. She was informed that eating too many simple carbohydrates or too many calories at one sitting increases the likelihood of GI side effects. [name] agreed to continue metformin 500 mg once daily #30 with no refills and follow up with [name] as directed to monitor her progress.  Obesity [name] is currently in the action stage of change. As such, her goal is to continue with weight loss efforts She has agreed to keep a food journal with 1200 calories and 85 grams of protein daily Rhonda [name] has been instructed to work up to a  goal of 150 minutes of combined cardio and strengthening exercise per week or spin on Monday for weight loss and overall health benefits. We discussed the following Behavioral Modification Strategies today: increase H2O intake, no skipping meals, keeping healthy foods in the home, increasing lean protein intake, decreasing simple carbohydrates, increasing vegetables, decrease eating out and work on meal planning and  easy cooking plans Rhonda [name] will journal when she eats out.  Rhonda [name] has agreed to follow up with our clinic in 2 weeks. She was informed of the importance of frequent follow up visits to maximize her success with intensive lifestyle modifications for her multiple health conditions.  ALLERGIES: No Known Allergies  MEDICATIONS: Current Outpatient Medications on File Prior to Visit  Medication Sig Dispense Refill  . buPROPion (WELLBUTRIN XL) 300 MG 24 hr tablet Take 300 mg by mouth daily.    Marland Kitchen ELDERBERRY PO Take by mouth.    . levonorgestrel (MIRENA) 20 MCG/24HR IUD 1 each by Intrauterine route once.    . Prenatal Vit-Fe Fumarate-FA (PRENATAL MULTIVITAMIN) TABS tablet Take 1 tablet by mouth daily at 12 noon.    Marland Kitchen spironolactone (ALDACTONE) 25 MG tablet Take 100 mg by mouth daily.     . clotrimazole-betamethasone (LOTRISONE) cream Apply 1 application topically 2 (two) times daily. (Patient not taking: Reported on 10/27/2018) 30 g 0   No current facility-administered medications on file prior to visit.     PAST MEDICAL HISTORY: Past Medical History:  Diagnosis Date  . Depression    treated  . PCOS (polycystic ovarian syndrome) 2008  . Plantar fasciitis     PAST SURGICAL HISTORY: Past Surgical History:  Procedure Laterality Date  . CHOLECYSTECTOMY      SOCIAL HISTORY: Social History   Tobacco Use  . Smoking status: Never Smoker  . Smokeless tobacco: Never Used  Substance Use Topics  . Alcohol use: No  . Drug use: No    FAMILY HISTORY: Family History  Problem Relation Age of Onset  . High blood pressure Mother   . Depression Father   . Liver disease Father   . Alcoholism Father   . Diabetes Paternal Grandmother     ROS: Review of Systems  Constitutional: Positive for weight loss.  Gastrointestinal: Negative for diarrhea, nausea and vomiting.  Musculoskeletal:       Negative for muscle weakness    PHYSICAL EXAM: Blood pressure 121/80, pulse 76, temperature 98.3  F (36.8 C), temperature source Oral, height 5\' 10"  (1.778 m), weight 253 lb (114.8 kg), SpO2 97 %, unknown if currently breastfeeding. Body mass index is 36.3 kg/m. Physical Exam Vitals signs reviewed.  Constitutional:      Appearance: Normal appearance. She is well-developed. She is obese.  Cardiovascular:     Rate and Rhythm: Normal rate.  Pulmonary:     Effort: Pulmonary effort is normal.  Musculoskeletal: Normal range of motion.  Skin:    General: Skin is warm and dry.  Neurological:     Mental Status: She is alert and oriented to person, place, and time.  Psychiatric:        Mood and Affect: Mood normal.        Behavior: Behavior normal.     RECENT LABS AND TESTS: BMET    Component Value Date/Time   NA 139 10/09/2018 0902   NA 142 08/06/2014 0215   K 4.6 10/09/2018 0902   K 3.5 08/06/2014 0215   CL 102 10/09/2018 0902   CL 108 (H) 08/06/2014 0215  CO2 23 10/09/2018 0902   CO2 20 (L) 08/06/2014 0215   GLUCOSE 91 10/09/2018 0902   GLUCOSE 74 09/18/2016 1804   GLUCOSE 113 (H) 08/06/2014 0215   BUN 13 10/09/2018 0902   BUN 15 08/06/2014 0215   CREATININE 0.95 10/09/2018 0902   CREATININE 1.00 08/06/2014 0215   CALCIUM 9.6 10/09/2018 0902   CALCIUM 9.3 08/06/2014 0215   GFRNONAA 79 10/09/2018 0902   GFRNONAA >60 08/06/2014 0215   GFRAA 91 10/09/2018 0902   GFRAA >60 08/06/2014 0215   Lab Results  Component Value Date   HGBA1C 5.2 10/09/2018   HGBA1C 5.2 01/09/2018   Lab Results  Component Value Date   INSULIN 13.0 10/09/2018   INSULIN 16.5 01/09/2018   CBC    Component Value Date/Time   WBC 4.7 01/09/2018 1116   WBC 7.8 09/20/2016 0621   RBC 4.75 01/09/2018 1116   RBC 3.81 09/20/2016 0621   HGB 14.5 01/09/2018 1116   HCT 42.1 01/09/2018 1116   PLT 195 09/20/2016 0621   PLT 300 08/06/2014 0215   MCV 89 01/09/2018 1116   MCV 92 08/06/2014 0215   MCH 30.5 01/09/2018 1116   MCH 31.4 09/20/2016 0621   MCHC 34.4 01/09/2018 1116   MCHC 35.2  09/20/2016 0621   RDW 13.7 01/09/2018 1116   RDW 13.3 08/06/2014 0215   LYMPHSABS 1.3 01/09/2018 1116   LYMPHSABS 0.4 (L) 08/06/2014 0215   MONOABS 0.7 08/06/2014 0215   EOSABS 0.1 01/09/2018 1116   EOSABS 0.1 08/06/2014 0215   BASOSABS 0.0 01/09/2018 1116   BASOSABS 0.2 (H) 08/06/2014 0215   Iron/TIBC/Ferritin/ %Sat No results found for: IRON, TIBC, FERRITIN, IRONPCTSAT Lipid Panel     Component Value Date/Time   CHOL 153 01/09/2018 1116   TRIG 63 01/09/2018 1116   HDL 43 01/09/2018 1116   LDLCALC 97 01/09/2018 1116   Hepatic Function Panel     Component Value Date/Time   PROT 7.6 10/09/2018 0902   PROT 8.5 (H) 08/06/2014 0215   ALBUMIN 4.7 10/09/2018 0902   ALBUMIN 4.3 08/06/2014 0215   AST 25 10/09/2018 0902   AST 33 08/06/2014 0215   ALT 39 (H) 10/09/2018 0902   ALT 58 08/06/2014 0215   ALKPHOS 76 10/09/2018 0902   ALKPHOS 115 08/06/2014 0215   BILITOT 0.5 10/09/2018 0902   BILITOT 0.7 08/06/2014 0215      Component Value Date/Time   TSH 1.470 01/09/2018 1116     Ref. Range 10/09/2018 09:02  Vitamin D, 25-Hydroxy Latest Ref Range: 30.0 - 100.0 ng/mL 35.8     OBESITY BEHAVIORAL INTERVENTION VISIT  Today's visit was # 13   Starting weight: 273 lbs Starting date: 01/09/2018 Today's weight : 253 lbs  Today's date: 10/27/2018 Total lbs lost to date: 20    10/27/2018  Height 5\' 10"  (1.778 m)  Weight 253 lb (114.8 kg)  BMI (Calculated) 36.3  BLOOD PRESSURE - SYSTOLIC 443  BLOOD PRESSURE - DIASTOLIC 80   Body Fat % 15.4 %  Total Body Water (lbs) 100 lbs    ASK: We discussed the diagnosis of obesity with Karma Greaser today and Jeniece agreed to give [name] permission to discuss obesity behavioral modification therapy today.  ASSESS: Tamarah has the diagnosis of obesity and her BMI today is 36.3 Meridith is in the action stage of change   ADVISE: Brettney was educated on the multiple health risks of obesity as well as the benefit of weight loss to  improve  her health. She was advised of the need for long term treatment and the importance of lifestyle modifications to improve her current health and to decrease her risk of future health problems.  AGREE: Multiple dietary modification options and treatment options were discussed and  Jayleah agreed to follow the recommendations documented in the above note.  ARRANGE: Jahne was educated on the importance of frequent visits to treat obesity as outlined per CMS and USPSTF guidelines and agreed to schedule her next follow up appointment today.  Corey Skains, am acting as Location manager for [employer]. Owens Shark, DO  I have reviewed the above documentation for accuracy and completeness, and I agree with the above. -Jearld Lesch, DO

## 2018-11-05 ENCOUNTER — Ambulatory Visit (INDEPENDENT_AMBULATORY_CARE_PROVIDER_SITE_OTHER): Payer: No Typology Code available for payment source | Admitting: Psychiatry

## 2018-11-05 ENCOUNTER — Encounter: Payer: Self-pay | Admitting: Psychiatry

## 2018-11-05 ENCOUNTER — Other Ambulatory Visit: Payer: Self-pay

## 2018-11-05 VITALS — BP 129/87 | HR 77 | Temp 99.1°F | Wt 261.4 lb

## 2018-11-05 DIAGNOSIS — F411 Generalized anxiety disorder: Secondary | ICD-10-CM | POA: Diagnosis not present

## 2018-11-05 DIAGNOSIS — F330 Major depressive disorder, recurrent, mild: Secondary | ICD-10-CM

## 2018-11-05 NOTE — Progress Notes (Signed)
Psychiatric Initial Adult Assessment   Patient Identification: Rhonda [name] MRN:  [id number] Date of Evaluation:  11/05/2018 Referral Source: Wayland Denis PA - C Chief Complaint:  ' I am here to establish care." Chief Complaint    Establish Care; Depression     Visit Diagnosis:    ICD-10-CM   1. MDD (major depressive disorder), recurrent episode, mild ([place]) F33.0   2. GAD (generalized anxiety disorder) F41.1     History of Present Illness:  Rhonda [name] is a 33 year old Caucasian female, married, employed, Ship broker, has a history of depression, anxiety, presented to clinic today to establish care.  Patient reports she has been struggling with depression all her life.  She was started on Wellbutrin in her 54s.  She reports ever since then she has been taking it regularly.  She did try to come off of it at some point however it gave her depressive symptoms and she hence restarted it.  She reports she was struggling with some sadness, crying spells, lack of motivation, inability to concentrate, irritability and so on since the past 6 months or so.  She reports since the past 2 months some of the symptoms are improving.  She however reports she continues to struggle with irritability and concentration problems.  She continues to take the Wellbutrin every day.  Patient reports anxiety symptoms since the past several months.  She reports she worries about everything to the extreme and always is ruminating about something.  She has a lot of racing thoughts.  Patient reports sleep is limited.  She reports it is not because she has sleep problems.  She reports it is more so because she has a lot to do.  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  She reports she has small children who are aged 2 and 34.  She hence has to stay at night trying to complete her schoolwork and this affects her sleep.  She has to wake up in the morning  around 4:30 AM to be back at work.  Patient denies any history of trauma.  Patient reports she was told at some point by a psychiatrist that she may have bipolar disorder.  She however could not give clear manic or hypomanic symptoms history.  She also completed a mood disorder questionnaire and scored very low on the same.  Patient reports good social support from her husband and her mother who lives with her.  Patient denies any suicidality, homicidality or perceptual disturbances.  Associated Signs/Symptoms: Depression Symptoms:  depressed mood, difficulty concentrating, anxiety, (Hypo) Manic Symptoms:  Denies Anxiety Symptoms:  Excessive Worry, Psychotic Symptoms:  denies PTSD Symptoms: Negative  Past Psychiatric History: Patient denies inpatient mental health admission.  Patient reports she overdosed on Tylenol when she was a teenager however told someone about it soon after that.  She did not need medical help.  She reports it was like a cry for help and not an actual suicide attempt.  She denies any other suicide attempts.  She was diagnosed with depression in her 23s.  Her medications were recently being prescribed by her primary medical doctor.  Previous Psychotropic Medications: Yes Bupropion ,another medication (does not remember names)  Substance Abuse History in the last 12 months:  No.  Consequences of Substance Abuse: Negative  Past Medical History:  Past Medical History:  Diagnosis Date  . Depression    treated  . PCOS (polycystic  ovarian syndrome) 2008  . Plantar fasciitis     Past Surgical History:  Procedure Laterality Date  . CHOLECYSTECTOMY      Family Psychiatric History: Several maternal aunts have bipolar disorder, father-alcoholism.  Family History:  Family History  Problem Relation Age of Onset  . High blood pressure Mother   . Depression Father   . Liver disease Father   . Alcoholism Father   . Alcohol abuse Father   . Diabetes Paternal  Grandmother   . Bipolar disorder Maternal Aunt     Social History:   Social History   Socioeconomic History  . Marital status: Married    Spouse name: [name]  . Number of children: 2  . Years of education: Not on file  . Highest education level: Bachelor's degree (e.g., BA, AB, BS)  Occupational History  . Occupation: Therapist, sports  Social Needs  . Financial resource strain: Not hard at all  . Food insecurity:    Worry: Never true    Inability: Never true  . Transportation needs:    Medical: No    Non-medical: No  Tobacco Use  . Smoking status: Never Smoker  . Smokeless tobacco: Never Used  Substance and Sexual Activity  . Alcohol use: No  . Drug use: No  . Sexual activity: Yes    Birth control/protection: None  Lifestyle  . Physical activity:    Days per week: 0 days    Minutes per session: 0 min  . Stress: Rather much  Relationships  . Social connections:    Talks on phone: Not on file    Gets together: Not on file    Attends religious service: More than 4 times per year    Active member of club or organization: No    Attends meetings of clubs or organizations: Never    Relationship status: Married  Other Topics Concern  . Not on file  Social History Narrative  . Not on file    Additional Social History: Patient is married.  She lives with her husband and her mother in [place].  Patient has a 56-year-old and a 70-year-old children.  She has good social support from her husband and her mother.  She currently works as an Therapist, sports at [employer].  She also is in school at [employer]- first semester grad school as a Public affairs consultant.  Allergies:  No Known Allergies  Metabolic Disorder Labs: Lab Results  Component Value Date   HGBA1C 5.2 10/09/2018   No results found for: PROLACTIN Lab Results  Component Value Date   CHOL 153 01/09/2018   TRIG 63 01/09/2018   HDL 43 01/09/2018   LDLCALC 97 01/09/2018   Lab Results  Component Value Date   TSH  1.470 01/09/2018    Therapeutic Level Labs: No results found for: LITHIUM No results found for: CBMZ No results found for: VALPROATE  Current Medications: Current Outpatient Medications  Medication Sig Dispense Refill  . buPROPion (WELLBUTRIN XL) 300 MG 24 hr tablet Take 300 mg by mouth daily.    . cyclobenzaprine (FLEXERIL) 5 MG tablet Take by mouth.    . ELDERBERRY PO Take by mouth.    . levonorgestrel (MIRENA) 20 MCG/24HR IUD 1 each by Intrauterine route once.    . meloxicam (MOBIC) 15 MG tablet Take by mouth.    . metFORMIN (GLUCOPHAGE) 500 MG tablet Take 1 tablet (500 mg total) by mouth daily with breakfast. 30 tablet 0  . predniSONE (DELTASONE) 10 MG  tablet Take 2 tablets (20 mg total) po x 5 days, then 1 tablet (10 mg total) po x 5 days.    . Prenatal Vit-Fe Fumarate-FA (PRENATAL MULTIVITAMIN) TABS tablet Take 1 tablet by mouth daily at 12 noon.    Marland Kitchen spironolactone (ALDACTONE) 100 MG tablet Take by mouth.    . Vitamin D, Ergocalciferol, (DRISDOL) 1.25 MG (50000 UT) CAPS capsule Take 1 capsule (50,000 Units total) by mouth every 7 (seven) days. 4 capsule 0   No current facility-administered medications for this visit.     Musculoskeletal: Strength & Muscle Tone: within normal limits Gait & Station: normal Patient leans: N/A  Psychiatric Specialty Exam: Review of Systems  Psychiatric/Behavioral: Positive for depression. The patient is nervous/anxious.   All other systems reviewed and are negative.   Blood pressure 129/87, pulse 77, temperature 99.1 F (37.3 C), temperature source Oral, weight 261 lb 6.4 oz (118.6 kg), unknown if currently breastfeeding.Body mass index is 37.51 kg/m.  General Appearance: Casual  Eye Contact:  Fair  Speech:  Clear and Coherent  Volume:  Normal  Mood:  Anxious and Depressed  Affect:  Congruent  Thought Process:  Goal Directed and Descriptions of Associations: Intact  Orientation:  Full (Time, Place, and Person)  Thought Content:   Logical  Suicidal Thoughts:  No  Homicidal Thoughts:  No  Memory:  Immediate;   Fair Recent;   Fair Remote;   Fair  Judgement:  Fair  Insight:  Fair  Psychomotor Activity:  Normal  Concentration:  Concentration: Fair and Attention Span: Fair  Recall:  [employer] of Knowledge:Fair  Language: Fair  Akathisia:  No  Handed:  Right  AIMS (if indicated): denies tremors, rigidity,stiffness  Assets:  Communication Skills Desire for Improvement Social Support  ADL's:  Intact  Cognition: WNL  Sleep:  limited   Screenings: PHQ2-9     Office Visit from 01/09/2018 in [place]  PHQ-2 Total Score  3  PHQ-9 Total Score  13      Assessment and Plan: Travia is a 33 year old 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  It is biologically predisposed given her history of mental health problems in her family.  Patient has psychosocial stressors of being in school, having a full-time job as well as having small children at home who needs her help.  Patient however has good social support system.  Patient struggles with depression and irritability.  She however reports she is not interested in medication changes today and would like to get GeneSight testing done.  Will refer her for psychotherapy sessions.  Plan as noted below.  Plan MDD- unstable Continue Wellbutrin XL 300 mg p.o. daily Discussed adding BuSpar, provided her medication education. Will refer for CBT with therapist here in clinic. PHQ 9 equals 10  For GAD-unstable Refer for CBT GAD 7 equals 9  Will order GeneSight testing today, she will get it done in clinic today.  I have reviewed labs in E HR-TSH-01/09/2018-1.4-within normal limits  Follow-up in clinic in 2 to 4 weeks or sooner if needed.  I have spent atleast 60 minutes face to face with patient today. More than 50 % of the time was spent for psychoeducation and supportive  psychotherapy and care coordination.  This note was generated in part or whole with voice recognition software. Voice recognition is usually quite accurate but there are transcription errors that can and very often do occur. I  apologize for any typographical errors that were not detected and corrected.       Ursula Alert, MD 3/18/202012:29 PM

## 2018-11-05 NOTE — Patient Instructions (Signed)
Buspirone tablets What is this medicine? BUSPIRONE (byoo SPYE rone) is used to treat anxiety disorders. This medicine may be used for other purposes; ask your health care provider or pharmacist if you have questions. COMMON BRAND NAME(S): BuSpar What should I tell my health care provider before I take this medicine? They need to know if you have any of these conditions: -kidney or liver disease -an unusual or allergic reaction to buspirone, other medicines, foods, dyes, or preservatives -pregnant or trying to get pregnant -breast-feeding How should I use this medicine? Take this medicine by mouth with a glass of water. Follow the directions on the prescription label. You may take this medicine with or without food. To ensure that this medicine always works the same way for you, you should take it either always with or always without food. Take your doses at regular intervals. Do not take your medicine more often than directed. Do not stop taking except on the advice of your doctor or health care professional. Talk to your pediatrician regarding the use of this medicine in children. Special care may be needed. Overdosage: If you think you have taken too much of this medicine contact a poison control center or emergency room at once. NOTE: This medicine is only for you. Do not share this medicine with others. What if I miss a dose? If you miss a dose, take it as soon as you can. If it is almost time for your next dose, take only that dose. Do not take double or extra doses. What may interact with this medicine? Do not take this medicine with any of the following medications: -linezolid -MAOIs like Carbex, Eldepryl, Marplan, Nardil, and Parnate -methylene blue -procarbazine This medicine may also interact with the following medications: -diazepam -digoxin -diltiazem -erythromycin -grapefruit juice -haloperidol -medicines for mental depression or mood problems -medicines for seizures like  carbamazepine, phenobarbital and phenytoin -nefazodone -other medications for anxiety -rifampin -ritonavir -some antifungal medicines like itraconazole, ketoconazole, and voriconazole -verapamil -warfarin This list may not describe all possible interactions. Give your health care provider a list of all the medicines, herbs, non-prescription drugs, or dietary supplements you use. Also tell them if you smoke, drink alcohol, or use illegal drugs. Some items may interact with your medicine. What should I watch for while using this medicine? Visit your doctor or health care professional for regular checks on your progress. It may take 1 to 2 weeks before your anxiety gets better. You may get drowsy or dizzy. Do not drive, use machinery, or do anything that needs mental alertness until you know how this drug affects you. Do not stand or sit up quickly, especially if you are an older patient. This reduces the risk of dizzy or fainting spells. Alcohol can make you more drowsy and dizzy. Avoid alcoholic drinks. What side effects may I notice from receiving this medicine? Side effects that you should report to your doctor or health care professional as soon as possible: -blurred vision or other vision changes -chest pain -confusion -difficulty breathing -feelings of hostility or anger -muscle aches and pains -numbness or tingling in hands or feet -ringing in the ears -skin rash and itching -vomiting -weakness Side effects that usually do not require medical attention (report to your doctor or health care professional if they continue or are bothersome): -disturbed dreams, nightmares -headache -nausea -restlessness or nervousness -sore throat and nasal congestion -stomach upset This list may not describe all possible side effects. Call your doctor for medical advice about side   effects. You may report side effects to FDA at 1-800-FDA-1088. Where should I keep my medicine? Keep out of the reach  of children. Store at room temperature below 30 degrees C (86 degrees F). Protect from light. Keep container tightly closed. Throw away any unused medicine after the expiration date. NOTE: This sheet is a summary. It may not cover all possible information. If you have questions about this medicine, talk to your doctor, pharmacist, or health care provider.  2019 Elsevier/Gold Standard (2010-03-16 18:06:11)  

## 2018-11-11 ENCOUNTER — Encounter (INDEPENDENT_AMBULATORY_CARE_PROVIDER_SITE_OTHER): Payer: Self-pay

## 2018-11-13 ENCOUNTER — Other Ambulatory Visit: Payer: Self-pay

## 2018-11-13 ENCOUNTER — Ambulatory Visit (INDEPENDENT_AMBULATORY_CARE_PROVIDER_SITE_OTHER): Payer: No Typology Code available for payment source | Admitting: Physician Assistant

## 2018-11-13 ENCOUNTER — Encounter (INDEPENDENT_AMBULATORY_CARE_PROVIDER_SITE_OTHER): Payer: Self-pay | Admitting: Physician Assistant

## 2018-11-13 DIAGNOSIS — Z6836 Body mass index (BMI) 36.0-36.9, adult: Secondary | ICD-10-CM | POA: Diagnosis not present

## 2018-11-13 DIAGNOSIS — E559 Vitamin D deficiency, unspecified: Secondary | ICD-10-CM

## 2018-11-13 MED ORDER — VITAMIN D (ERGOCALCIFEROL) 1.25 MG (50000 UNIT) PO CAPS: 50000.0000 [IU] | ORAL_CAPSULE | ORAL | 0 refills | Status: DC

## 2018-11-14 MED FILL — SPIRONOLACTONE 100 MG TAB: 100 | 90 days supply | Qty: 90 | Fill #0

## 2018-11-17 NOTE — Progress Notes (Addendum)
Office: [phone]  /  Fax: [phone] TeleHealth Visit:  Rhonda [name] has consented to this TeleHealth visit today via [place]. The patient is located at home, the provider is located at the [facility] office. The participants in this visit include the listed provider and patient and any and all parties involved.   HPI:   Chief Complaint: OBESITY Rhonda [name] is here to discuss her progress with her obesity treatment plan. She is on the Category 2 plan and is following her eating plan approximately 50 % of the time. She states she is walking for 30 minutes 1 time per week. 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 Due to her new routine, she is having trouble following the plan. We were unable to weight the patient today for this TeleHealth visit.She feels as if she has maintained weight since her last visit. She has lost 20 lbs since starting treatment with [name].  Vitamin D deficiency Rhonda [name] has a diagnosis of vitamin D deficiency. She is currently taking vit D and denies nausea, vomiting or muscle weakness.  ASSESSMENT AND PLAN:  Vitamin D deficiency - Plan: Vitamin D, Ergocalciferol, (DRISDOL) 1.25 MG (50000 UT) CAPS capsule  Class 2 severe obesity with serious comorbidity and body mass index (BMI) of 36.0 to 36.9 in adult, unspecified obesity type ([place])  PLAN:  Vitamin D Deficiency Rhonda [name] was informed that low vitamin D levels contributes to fatigue and are associated with obesity, breast, and colon cancer. She agrees to continue to take prescription Vit D @50 ,000 IU every week #4 with no refills and will follow up for routine testing of vitamin D, at least 2-3 times per year. She was informed of the risk of over-replacement of vitamin D and agrees to not increase her dose unless she discusses this with [name] first. Rhonda [name] agrees to follow up as directed.  I  spent > than 50% of the 25 minute visit on counseling as documented in the note.  Obesity Rhonda [name] is currently in the action stage of change. As such, her goal is to continue with weight loss efforts She has agreed to follow the Category 2 plan Rhonda [name] has been instructed to work up to a goal of 150 minutes of combined cardio and strengthening exercise per week for weight loss and overall health benefits. We discussed the following Behavioral Modification Strategies today: increasing lean protein intake and work on meal planning and easy cooking plans  Rhonda [name] has agreed to follow up with our clinic in 2 weeks. She was informed of the importance of frequent follow up visits to maximize her success with intensive lifestyle modifications for her multiple health conditions.  I spent > than 50% of the 25 minute visit on counseling as documented in the note.   ALLERGIES: No Known Allergies  MEDICATIONS: Current Outpatient Medications on File Prior to Visit  Medication Sig Dispense Refill  . buPROPion (WELLBUTRIN XL) 300 MG 24 hr tablet Take 300 mg by mouth daily.    . cyclobenzaprine (FLEXERIL) 5 MG tablet Take by mouth.    . ELDERBERRY PO Take by mouth.    . levonorgestrel (MIRENA) 20 MCG/24HR IUD 1 each by Intrauterine route once.    . meloxicam (MOBIC) 15 MG tablet Take by mouth.    . metFORMIN (GLUCOPHAGE) 500 MG tablet Take 1 tablet (500 mg total) by mouth daily with breakfast. 30  tablet 0  . predniSONE (DELTASONE) 10 MG tablet Take 2 tablets (20 mg total) po x 5 days, then 1 tablet (10 mg total) po x 5 days.    . Prenatal Vit-Fe Fumarate-FA (PRENATAL MULTIVITAMIN) TABS tablet Take 1 tablet by mouth daily at 12 noon.    Marland Kitchen spironolactone (ALDACTONE) 100 MG tablet Take by mouth.     No current facility-administered medications on file prior to visit.     PAST MEDICAL HISTORY: Past Medical History:  Diagnosis Date  . Depression    treated  . PCOS (polycystic ovarian syndrome) 2008   . Plantar fasciitis     PAST SURGICAL HISTORY: Past Surgical History:  Procedure Laterality Date  . CHOLECYSTECTOMY      SOCIAL HISTORY: Social History   Tobacco Use  . Smoking status: Never Smoker  . Smokeless tobacco: Never Used  Substance Use Topics  . Alcohol use: No  . Drug use: No    FAMILY HISTORY: Family History  Problem Relation Age of Onset  . High blood pressure Mother   . Depression Father   . Liver disease Father   . Alcoholism Father   . Alcohol abuse Father   . Diabetes Paternal Grandmother   . Bipolar disorder Maternal Aunt     ROS: Review of Systems  Constitutional: Negative for weight loss.  Gastrointestinal: Negative for nausea and vomiting.  Musculoskeletal:       Negative for muscle weakness    PHYSICAL EXAM: Pt in no acute distress  RECENT LABS AND TESTS: BMET    Component Value Date/Time   NA 139 10/09/2018 0902   NA 142 08/06/2014 0215   K 4.6 10/09/2018 0902   K 3.5 08/06/2014 0215   CL 102 10/09/2018 0902   CL 108 (H) 08/06/2014 0215   CO2 23 10/09/2018 0902   CO2 20 (L) 08/06/2014 0215   GLUCOSE 91 10/09/2018 0902   GLUCOSE 74 09/18/2016 1804   GLUCOSE 113 (H) 08/06/2014 0215   BUN 13 10/09/2018 0902   BUN 15 08/06/2014 0215   CREATININE 0.95 10/09/2018 0902   CREATININE 1.00 08/06/2014 0215   CALCIUM 9.6 10/09/2018 0902   CALCIUM 9.3 08/06/2014 0215   GFRNONAA 79 10/09/2018 0902   GFRNONAA >60 08/06/2014 0215   GFRAA 91 10/09/2018 0902   GFRAA >60 08/06/2014 0215   Lab Results  Component Value Date   HGBA1C 5.2 10/09/2018   HGBA1C 5.2 01/09/2018   Lab Results  Component Value Date   INSULIN 13.0 10/09/2018   INSULIN 16.5 01/09/2018   CBC    Component Value Date/Time   WBC 4.7 01/09/2018 1116   WBC 7.8 09/20/2016 0621   RBC 4.75 01/09/2018 1116   RBC 3.81 09/20/2016 0621   HGB 14.5 01/09/2018 1116   HCT 42.1 01/09/2018 1116   PLT 195 09/20/2016 0621   PLT 300 08/06/2014 0215   MCV 89 01/09/2018 1116    MCV 92 08/06/2014 0215   MCH 30.5 01/09/2018 1116   MCH 31.4 09/20/2016 0621   MCHC 34.4 01/09/2018 1116   MCHC 35.2 09/20/2016 0621   RDW 13.7 01/09/2018 1116   RDW 13.3 08/06/2014 0215   LYMPHSABS 1.3 01/09/2018 1116   LYMPHSABS 0.4 (L) 08/06/2014 0215   MONOABS 0.7 08/06/2014 0215   EOSABS 0.1 01/09/2018 1116   EOSABS 0.1 08/06/2014 0215   BASOSABS 0.0 01/09/2018 1116   BASOSABS 0.2 (H) 08/06/2014 0215   Iron/TIBC/Ferritin/ %Sat No results found for: IRON, TIBC, FERRITIN, IRONPCTSAT Lipid Panel  Component Value Date/Time   CHOL 153 01/09/2018 1116   TRIG 63 01/09/2018 1116   HDL 43 01/09/2018 1116   LDLCALC 97 01/09/2018 1116   Hepatic Function Panel     Component Value Date/Time   PROT 7.6 10/09/2018 0902   PROT 8.5 (H) 08/06/2014 0215   ALBUMIN 4.7 10/09/2018 0902   ALBUMIN 4.3 08/06/2014 0215   AST 25 10/09/2018 0902   AST 33 08/06/2014 0215   ALT 39 (H) 10/09/2018 0902   ALT 58 08/06/2014 0215   ALKPHOS 76 10/09/2018 0902   ALKPHOS 115 08/06/2014 0215   BILITOT 0.5 10/09/2018 0902   BILITOT 0.7 08/06/2014 0215      Component Value Date/Time   TSH 1.470 01/09/2018 1116     Ref. Range 10/09/2018 09:02  Vitamin D, 25-Hydroxy Latest Ref Range: 30.0 - 100.0 ng/mL 35.8     I, Doreene Nest, am acting as Location manager for Abby Potash, PA-C I, Abby Potash, PA-C have reviewed above note and agree with its content

## 2018-11-24 ENCOUNTER — Other Ambulatory Visit: Payer: Self-pay

## 2018-11-24 ENCOUNTER — Telehealth: Payer: Self-pay | Admitting: Family

## 2018-11-24 ENCOUNTER — Ambulatory Visit (INDEPENDENT_AMBULATORY_CARE_PROVIDER_SITE_OTHER): Payer: No Typology Code available for payment source | Admitting: Licensed Clinical Social Worker

## 2018-11-24 DIAGNOSIS — F411 Generalized anxiety disorder: Secondary | ICD-10-CM | POA: Diagnosis not present

## 2018-11-24 DIAGNOSIS — N390 Urinary tract infection, site not specified: Secondary | ICD-10-CM

## 2018-11-24 DIAGNOSIS — F330 Major depressive disorder, recurrent, mild: Secondary | ICD-10-CM | POA: Diagnosis not present

## 2018-11-24 DIAGNOSIS — A499 Bacterial infection, unspecified: Secondary | ICD-10-CM

## 2018-11-24 MED ORDER — NITROFURANTOIN MONOHYD MACRO 100 MG PO CAPS: 100.0000 mg | ORAL_CAPSULE | Freq: Two times a day (BID) | ORAL | 0 refills | Status: DC

## 2018-11-24 NOTE — Progress Notes (Signed)

## 2018-11-26 ENCOUNTER — Encounter (INDEPENDENT_AMBULATORY_CARE_PROVIDER_SITE_OTHER): Payer: Self-pay

## 2018-11-27 ENCOUNTER — Encounter (INDEPENDENT_AMBULATORY_CARE_PROVIDER_SITE_OTHER): Payer: Self-pay | Admitting: Physician Assistant

## 2018-11-27 ENCOUNTER — Other Ambulatory Visit: Payer: Self-pay

## 2018-11-27 ENCOUNTER — Ambulatory Visit (INDEPENDENT_AMBULATORY_CARE_PROVIDER_SITE_OTHER): Payer: No Typology Code available for payment source | Admitting: Physician Assistant

## 2018-11-27 DIAGNOSIS — Z6836 Body mass index (BMI) 36.0-36.9, adult: Secondary | ICD-10-CM

## 2018-11-27 DIAGNOSIS — E559 Vitamin D deficiency, unspecified: Secondary | ICD-10-CM

## 2018-11-27 DIAGNOSIS — E8881 Metabolic syndrome: Secondary | ICD-10-CM

## 2018-11-27 MED ORDER — METFORMIN HCL 500 MG PO TABS: 500.0000 mg | ORAL_TABLET | Freq: Every day | ORAL | 0 refills | Status: DC

## 2018-12-01 NOTE — Progress Notes (Signed)
Office: [phone]  /  Fax: [phone] TeleHealth Visit:  Rhonda [name] has verbally consented to this TeleHealth visit today. The patient is located at home, the provider is located at the [facility] office. The participants in this visit include the listed provider and patient. The visit was conducted today via webex.  HPI:   Chief Complaint: OBESITY Rhonda [name] is here to discuss her progress with her obesity treatment plan. She is on the keep a food journal with 1200 calories and 85 grams of protein daily and is following her eating plan approximately 50 % of the time. She states she is walking for 30 minutes 2-3 times per week. Rhonda [name] reports that she has not been journaling. She is working at [employer] center currently and reports ordering out 2-3 times per week. She is ready to get back on track.  We were unable to weigh the patient today for this TeleHealth visit. She feels as if she has maintained her weight since her last visit. She has lost 20 lbs since starting treatment with [name].  Insulin Resistance Rhonda [name] has a diagnosis of insulin resistance based on her elevated fasting insulin level >5. Although Rhonda [name]'s blood glucose readings are still under good control, insulin resistance puts her at greater risk of metabolic syndrome and diabetes. She denies nausea, vomiting, or diarrhea on metformin. She continues to work on diet and exercise to decrease risk of diabetes. She denies polyphagia.  Vitamin D Deficiency Rhonda [name] has a diagnosis of vitamin D deficiency. She is currently taking prescription Vit D and denies nausea, vomiting or muscle weakness.  ASSESSMENT AND PLAN:  Insulin resistance - Plan: metFORMIN (GLUCOPHAGE) 500 MG tablet  Vitamin D deficiency  Class 2 severe obesity with serious comorbidity and body mass index (BMI) of 36.0 to 36.9 in adult, unspecified obesity type ([place])  PLAN:  Insulin Resistance Rhonda [name] will continue to work on  weight loss, exercise, and decreasing simple carbohydrates in her diet to help decrease the risk of diabetes. We dicussed metformin including benefits and risks. She was informed that eating too many simple carbohydrates or too many calories at one sitting increases the likelihood of GI side effects. Rhonda [name] agrees to continue taking metformin 500 mg q AM #30 and we will refill for 1 month. Rhonda [name] agrees to follow up with our clinic in 3 weeks as directed to monitor her progress.  Vitamin D Deficiency Rhonda [name] was informed that low vitamin D levels contributes to fatigue and are associated with obesity, breast, and colon cancer. Rhonda [name] agrees to continue taking prescription Vit D @50 ,000 IU every week and will follow up for routine testing of vitamin D, at least 2-3 times per year. She was informed of the risk of over-replacement of vitamin D and agrees to not increase her dose unless she discusses this with [name] first. Rhonda [name] agrees to follow up with our clinic in 3 weeks.  Obesity Rhonda [name] is currently in the action stage of change. As such, her goal is to continue with weight loss efforts She has agreed to keep a food journal with 1200 calories and 85 grams of protein daily Rhonda [name] has been instructed to work up to a goal of 150 minutes of combined cardio and strengthening exercise per week for weight loss and overall health benefits. We discussed the following Behavioral Modification Strategies today: work on meal planning and easy cooking plans and keeping healthy food in the home   Rhonda [name] has agreed to follow up with our clinic  in 3 weeks. She was informed of the importance of frequent follow up visits to maximize her success with intensive lifestyle modifications for her multiple health conditions.  ALLERGIES: No Known Allergies  MEDICATIONS: Current Outpatient Medications on File Prior to Visit  Medication Sig Dispense Refill  . buPROPion (WELLBUTRIN XL) 300 MG 24 hr tablet Take 300 mg  by mouth daily.    . cyclobenzaprine (FLEXERIL) 5 MG tablet Take by mouth.    . ELDERBERRY PO Take by mouth.    . levonorgestrel (MIRENA) 20 MCG/24HR IUD 1 each by Intrauterine route once.    . meloxicam (MOBIC) 15 MG tablet Take by mouth.    . nitrofurantoin, macrocrystal-monohydrate, (MACROBID) 100 MG capsule Take 1 capsule (100 mg total) by mouth 2 (two) times daily. 10 capsule 0  . predniSONE (DELTASONE) 10 MG tablet Take 2 tablets (20 mg total) po x 5 days, then 1 tablet (10 mg total) po x 5 days.    . Prenatal Vit-Fe Fumarate-FA (PRENATAL MULTIVITAMIN) TABS tablet Take 1 tablet by mouth daily at 12 noon.    Marland Kitchen spironolactone (ALDACTONE) 100 MG tablet Take by mouth.    . Vitamin D, Ergocalciferol, (DRISDOL) 1.25 MG (50000 UT) CAPS capsule Take 1 capsule (50,000 Units total) by mouth every 7 (seven) days. 4 capsule 0   No current facility-administered medications on file prior to visit.     PAST MEDICAL HISTORY: Past Medical History:  Diagnosis Date  . Depression    treated  . PCOS (polycystic ovarian syndrome) 2008  . Plantar fasciitis     PAST SURGICAL HISTORY: Past Surgical History:  Procedure Laterality Date  . CHOLECYSTECTOMY      SOCIAL HISTORY: Social History   Tobacco Use  . Smoking status: Never Smoker  . Smokeless tobacco: Never Used  Substance Use Topics  . Alcohol use: No  . Drug use: No    FAMILY HISTORY: Family History  Problem Relation Age of Onset  . High blood pressure Mother   . Depression Father   . Liver disease Father   . Alcoholism Father   . Alcohol abuse Father   . Diabetes Paternal Grandmother   . Bipolar disorder Maternal Aunt     ROS: Review of Systems  Constitutional: Negative for weight loss.  Gastrointestinal: Negative for diarrhea, nausea and vomiting.  Musculoskeletal:       Negative muscle weakness  Endo/Heme/Allergies:       Negative polyphagia    PHYSICAL EXAM: Pt in no acute distress  RECENT LABS AND TESTS: BMET     Component Value Date/Time   NA 139 10/09/2018 0902   NA 142 08/06/2014 0215   K 4.6 10/09/2018 0902   K 3.5 08/06/2014 0215   CL 102 10/09/2018 0902   CL 108 (H) 08/06/2014 0215   CO2 23 10/09/2018 0902   CO2 20 (L) 08/06/2014 0215   GLUCOSE 91 10/09/2018 0902   GLUCOSE 74 09/18/2016 1804   GLUCOSE 113 (H) 08/06/2014 0215   BUN 13 10/09/2018 0902   BUN 15 08/06/2014 0215   CREATININE 0.95 10/09/2018 0902   CREATININE 1.00 08/06/2014 0215   CALCIUM 9.6 10/09/2018 0902   CALCIUM 9.3 08/06/2014 0215   GFRNONAA 79 10/09/2018 0902   GFRNONAA >60 08/06/2014 0215   GFRAA 91 10/09/2018 0902   GFRAA >60 08/06/2014 0215   Lab Results  Component Value Date   HGBA1C 5.2 10/09/2018   HGBA1C 5.2 01/09/2018   Lab Results  Component Value Date  INSULIN 13.0 10/09/2018   INSULIN 16.5 01/09/2018   CBC    Component Value Date/Time   WBC 4.7 01/09/2018 1116   WBC 7.8 09/20/2016 0621   RBC 4.75 01/09/2018 1116   RBC 3.81 09/20/2016 0621   HGB 14.5 01/09/2018 1116   HCT 42.1 01/09/2018 1116   PLT 195 09/20/2016 0621   PLT 300 08/06/2014 0215   MCV 89 01/09/2018 1116   MCV 92 08/06/2014 0215   MCH 30.5 01/09/2018 1116   MCH 31.4 09/20/2016 0621   MCHC 34.4 01/09/2018 1116   MCHC 35.2 09/20/2016 0621   RDW 13.7 01/09/2018 1116   RDW 13.3 08/06/2014 0215   LYMPHSABS 1.3 01/09/2018 1116   LYMPHSABS 0.4 (L) 08/06/2014 0215   MONOABS 0.7 08/06/2014 0215   EOSABS 0.1 01/09/2018 1116   EOSABS 0.1 08/06/2014 0215   BASOSABS 0.0 01/09/2018 1116   BASOSABS 0.2 (H) 08/06/2014 0215   Iron/TIBC/Ferritin/ %Sat No results found for: IRON, TIBC, FERRITIN, IRONPCTSAT Lipid Panel     Component Value Date/Time   CHOL 153 01/09/2018 1116   TRIG 63 01/09/2018 1116   HDL 43 01/09/2018 1116   LDLCALC 97 01/09/2018 1116   Hepatic Function Panel     Component Value Date/Time   PROT 7.6 10/09/2018 0902   PROT 8.5 (H) 08/06/2014 0215   ALBUMIN 4.7 10/09/2018 0902   ALBUMIN 4.3  08/06/2014 0215   AST 25 10/09/2018 0902   AST 33 08/06/2014 0215   ALT 39 (H) 10/09/2018 0902   ALT 58 08/06/2014 0215   ALKPHOS 76 10/09/2018 0902   ALKPHOS 115 08/06/2014 0215   BILITOT 0.5 10/09/2018 0902   BILITOT 0.7 08/06/2014 0215      Component Value Date/Time   TSH 1.470 01/09/2018 1116      I, Trixie Dredge, am acting as transcriptionist for Abby Potash, PA-C I, Abby Potash, PA-C have reviewed above note and agree with its content

## 2018-12-03 ENCOUNTER — Other Ambulatory Visit: Payer: Self-pay

## 2018-12-03 ENCOUNTER — Encounter: Payer: Self-pay | Admitting: Psychiatry

## 2018-12-03 ENCOUNTER — Ambulatory Visit (INDEPENDENT_AMBULATORY_CARE_PROVIDER_SITE_OTHER): Payer: No Typology Code available for payment source | Admitting: Psychiatry

## 2018-12-03 DIAGNOSIS — F411 Generalized anxiety disorder: Secondary | ICD-10-CM | POA: Diagnosis not present

## 2018-12-03 DIAGNOSIS — F330 Major depressive disorder, recurrent, mild: Secondary | ICD-10-CM | POA: Diagnosis not present

## 2018-12-03 NOTE — Progress Notes (Signed)
Virtual Visit via Telephone Note  I connected with Rhonda [name] on 12/03/18 at  4:00 PM EDT by telephone and verified that I am speaking with the correct person using two identifiers.   I discussed the limitations, risks, security and privacy concerns of performing an evaluation and management service by telephone and the availability of in person appointments. I also discussed with the patient that there may be a patient responsible charge related to this service. The patient expressed understanding and agreed to proceed.      I discussed the assessment and treatment plan with the patient. The patient was provided an opportunity to ask questions and all were answered. The patient agreed with the plan and demonstrated an understanding of the instructions.   The patient was advised to call back or seek an in-person evaluation if the symptoms worsen or if the condition fails to improve as anticipated.   [place] MD OP Progress Note  12/03/2018 5:09 PM JELISA [place]  MRN:  [id number]  Chief Complaint:  Chief Complaint    Follow-up     HPI: Rhonda [name] is a 33 yr old Caucasian female, married, employed, Ship broker, has a history of depression, anxiety was evaluated by phone today.  Patient today reports she is currently making progress on the current medication regimen.  She denies any significant depressive symptoms or anxiety symptoms and reports she is coping okay with the COVID-19 outbreak.  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  She reports her office closed until June due to the COVID-19 outbreak.  She reports she is financially doing okay so far.  She also got a stimulus check today which also has been very helpful.  Patient reports sleep is good.  Patient denies any suicidality, homicidality or perceptual disturbances.  Discussed with patient that her GeneSight testing results reported.  Spent time with  her discussing and reviewing test results.  Discussed with patient that Wellbutrin the medication she is taking now is on her good list.  She wants to continue to take the current dosage for now.  She is not interested in any dosage changes.  She does have Methylfolate conversion reduction and hence she was advised to take l-methylfolate over-the-counter.  She agrees with plan.  She denies any other concerns today. Visit Diagnosis:    ICD-10-CM   1. MDD (major depressive disorder), recurrent episode, mild ([place]) F33.0   2. GAD (generalized anxiety disorder) F41.1     Past Psychiatric History: Reviewed past psychiatric history from my progress note on 11/05/2018.  Past trials of bupropion.  Past Medical History:  Past Medical History:  Diagnosis Date  . Depression    treated  . PCOS (polycystic ovarian syndrome) 2008  . Plantar fasciitis     Past Surgical History:  Procedure Laterality Date  . CHOLECYSTECTOMY      Family Psychiatric History: Reviewed family psychiatric history from my progress note on 11/05/2018.  Family History:  Family History  Problem Relation Age of Onset  . High blood pressure Mother   . Depression Father   . Liver disease Father   . Alcoholism Father   . Alcohol abuse Father   . Diabetes Paternal Grandmother   . Bipolar disorder Maternal Aunt     Social History: Reviewed social history from my progress note on 11/05/2018 Social History   Socioeconomic History  . Marital status: Married    Spouse name: [name]  .  Number of children: 2  . Years of education: Not on file  . Highest education level: Bachelor's degree (e.g., BA, AB, BS)  Occupational History  . Occupation: Therapist, sports  Social Needs  . Financial resource strain: Not hard at all  . Food insecurity:    Worry: Never true    Inability: Never true  . Transportation needs:    Medical: No    Non-medical: No  Tobacco Use  . Smoking status: Never Smoker  . Smokeless tobacco: Never Used   Substance and Sexual Activity  . Alcohol use: No  . Drug use: No  . Sexual activity: Yes    Birth control/protection: None  Lifestyle  . Physical activity:    Days per week: 0 days    Minutes per session: 0 min  . Stress: Rather much  Relationships  . Social connections:    Talks on phone: Not on file    Gets together: Not on file    Attends religious service: More than 4 times per year    Active member of club or organization: No    Attends meetings of clubs or organizations: Never    Relationship status: Married  Other Topics Concern  . Not on file  Social History Narrative  . Not on file    Allergies: No Known Allergies  Metabolic Disorder Labs: Lab Results  Component Value Date   HGBA1C 5.2 10/09/2018   No results found for: PROLACTIN Lab Results  Component Value Date   CHOL 153 01/09/2018   TRIG 63 01/09/2018   HDL 43 01/09/2018   LDLCALC 97 01/09/2018   Lab Results  Component Value Date   TSH 1.470 01/09/2018    Therapeutic Level Labs: No results found for: LITHIUM No results found for: VALPROATE No components found for:  CBMZ  Current Medications: Current Outpatient Medications  Medication Sig Dispense Refill  . buPROPion (WELLBUTRIN XL) 300 MG 24 hr tablet Take 300 mg by mouth daily.    Marland Kitchen ELDERBERRY PO Take by mouth.    . levonorgestrel (MIRENA) 20 MCG/24HR IUD 1 each by Intrauterine route once.    . metFORMIN (GLUCOPHAGE) 500 MG tablet Take 1 tablet (500 mg total) by mouth daily with breakfast. 30 tablet 0  . Multiple Vitamin (MULTIVITAMIN) tablet Take 1 tablet by mouth daily.    Marland Kitchen spironolactone (ALDACTONE) 100 MG tablet Take by mouth.    . Vitamin D, Ergocalciferol, (DRISDOL) 1.25 MG (50000 UT) CAPS capsule Take 1 capsule (50,000 Units total) by mouth every 7 (seven) days. 4 capsule 0   No current facility-administered medications for this visit.      Musculoskeletal: Strength & Muscle Tone: UTA Gait & Station: UTA Patient leans:  N/A  Psychiatric Specialty Exam: Review of Systems  Psychiatric/Behavioral: The patient is nervous/anxious.   All other systems reviewed and are negative.   unknown if currently breastfeeding.There is no height or weight on file to calculate BMI.  General Appearance: UTA  Eye Contact:  UTA  Speech:  Clear and Coherent  Volume:  Normal  Mood:  Anxious  Affect:  UTA  Thought Process:  Goal Directed and Descriptions of Associations: Intact  Orientation:  Full (Time, Place, and Person)  Thought Content: WDL   Suicidal Thoughts:  No  Homicidal Thoughts:  No  Memory:  Immediate;   Fair Recent;   Fair Remote;   Fair  Judgement:  Fair  Insight:  Fair  Psychomotor Activity:  UTA  Concentration:  Concentration: Fair and Attention Span:  Fair  Recall:  Smiley Houseman of Knowledge: Fair  Language: Fair  Akathisia:  No  Handed:  Right  AIMS (if indicated): UTA  Assets:  Communication Skills Desire for Improvement Social Support  ADL's:  Intact  Cognition: WNL  Sleep:  Fair   Screenings: PHQ2-9     Office Visit from 01/09/2018 in [place]  PHQ-2 Total Score  3  PHQ-9 Total Score  13       Assessment and Plan: Verble is a 33 year old Caucasian female, married, employed, Ship broker, has a history of depression, was evaluated by phone today.  Patient is biologically predisposed given her history of mental health problems in her family.  She also has psychosocial stressors of being in school, job related stressors, having young children who needs her help and so on.  Patient currently is making progress on the current medication regimen.  Discussed plan as noted below.  Plan MDD-improving Wellbutrin XL 300 mg p.o. daily Patient has been referred for CBT. Discussed starting l-methylfolate 7.5 mg for folic acid conversion reduction per her GeneSight testing.  For GAD-improving Patient referred for CBT.  Some time was spent reviewing and discussing GeneSight testing  results with patient.  GeneSight testing report will be scanned into her medical records.  Patient will pick up results when she comes in to clinic for her next appointment.  Follow-up in clinic in 6 weeks or sooner if needed.  I have spent atleast 15 minutes non face to face with patient today. More than 50 % of the time was spent for psychoeducation and supportive psychotherapy and care coordination.      Ursula Alert, MD 12/03/2018, 5:09 PM

## 2018-12-03 NOTE — Progress Notes (Signed)
TC  On 12-03-18 @ 1:42 pt medical and surgical hx section was completed/no updates. Pt allergies were reviewed with no changes. Pt medications and pharmacy were reviewed and updated. No vitals taken due to this is a phone visit.

## 2018-12-10 ENCOUNTER — Other Ambulatory Visit: Payer: Self-pay

## 2018-12-10 ENCOUNTER — Ambulatory Visit: Payer: No Typology Code available for payment source | Admitting: Licensed Clinical Social Worker

## 2018-12-12 ENCOUNTER — Encounter (INDEPENDENT_AMBULATORY_CARE_PROVIDER_SITE_OTHER): Payer: Self-pay | Admitting: Physician Assistant

## 2018-12-13 ENCOUNTER — Telehealth: Payer: No Typology Code available for payment source | Admitting: Nurse Practitioner

## 2018-12-13 DIAGNOSIS — B372 Candidiasis of skin and nail: Secondary | ICD-10-CM

## 2018-12-13 MED ORDER — NYSTATIN 100000 UNIT/GM EX CREA: 1.0000 "application " | TOPICAL_CREAM | Freq: Two times a day (BID) | CUTANEOUS | 0 refills | Status: DC

## 2018-12-13 NOTE — Progress Notes (Signed)
E Visit for Rash  We are sorry that you are not feeling well. Here is how we plan to help!     Based upon your presentation it appears you have a fungal infection.  I have prescribed: and Nystatin cream apply to the affected area twice daily   HOME CARE:   Take cool showers and avoid direct sunlight.  Apply cool compress or wet dressings.  Take a bath in an oatmeal bath.  Sprinkle content of one Aveeno packet under running faucet with comfortably warm water.  Bathe for 15-20 minutes, 1-2 times daily.  Pat dry with a towel. Do not rub the rash.  Use hydrocortisone cream.  Take an antihistamine like Benadryl for widespread rashes that itch.  The adult dose of Benadryl is 25-50 mg by mouth 4 times daily.  Caution:  This type of medication may cause sleepiness.  Do not drink alcohol, drive, or operate dangerous machinery while taking antihistamines.  Do not take these medications if you have prostate enlargement.  Read package instructions thoroughly on all medications that you take.  GET HELP RIGHT AWAY IF:   Symptoms don't go away after treatment.  Severe itching that persists.  If you rash spreads or swells.  If you rash begins to smell.  If it blisters and opens or develops a yellow-brown crust.  You develop a fever.  You have a sore throat.  You become short of breath.  MAKE SURE YOU:  Understand these instructions. Will watch your condition. Will get help right away if you are not doing well or get worse.  Thank you for choosing an e-visit. Your e-visit answers were reviewed by a board certified advanced clinical practitioner to complete your personal care plan. Depending upon the condition, your plan could have included both over the counter or prescription medications. Please review your pharmacy choice. Be sure that the pharmacy you have chosen is open so that you can pick up your prescription now.  If there is a problem you may message your provider in  MyChart to have the prescription routed to another pharmacy. Your safety is important to us. If you have drug allergies check your prescription carefully.  For the next 24 hours, you can use MyChart to ask questions about today's visit, request a non-urgent call back, or ask for a work or school excuse from your e-visit provider. You will get an email in the next two days asking about your experience. I hope that your e-visit has been valuable and will speed your recovery.   5-10 minutes spent reviewing and documenting in chart.    

## 2018-12-16 ENCOUNTER — Ambulatory Visit (INDEPENDENT_AMBULATORY_CARE_PROVIDER_SITE_OTHER): Payer: Self-pay | Admitting: Physician Assistant

## 2019-01-09 NOTE — Progress Notes (Signed)
[place] Initial Clinical Assessment  MRN: [id number] NAME: Rhonda [name] [name] Date: 11/24/18  Total time: 1 hour  Type of Contact:  telephone Patient consent obtained:  yes Reason for Visit today:  establish care  Treatment History Patient recently received Inpatient Treatment:  denies  Facility/Program:    Date of discharge:   Patient currently being seen by therapist/psychiatrist:  denies Patient currently receiving the following services:    Past Psychiatric History/Hospitalization(s): Anxiety: No Bipolar Disorder: No Depression: No Mania: No Psychosis: No Schizophrenia: No Personality Disorder: No Hospitalization for psychiatric illness: No History of Electroconvulsive Shock Therapy: No Prior Suicide Attempts: No  Clinical Assessment:  PHQ-9 Assessments: Depression screen [name] 2/9 01/09/2018  Decreased Interest 2  Down, Depressed, Hopeless 1  PHQ - 2 Score 3  Altered sleeping 1  Tired, decreased energy 3  Change in appetite 1  Feeling bad or failure about yourself  2  Trouble concentrating 2  Moving slowly or fidgety/restless 1  Suicidal thoughts 0  PHQ-9 Score 13  Difficult doing work/chores Somewhat difficult    GAD-7 Assessments: No flowsheet data found.   Social Functioning Social maturity:   Social judgement:    Stress Current stressors:   Familial stressors:   Sleep:   Appetite:   Coping ability:   Patient taking medications as prescribed:    Current medications:  Outpatient Encounter Medications as of 11/24/2018  Medication Sig  . buPROPion (WELLBUTRIN XL) 300 MG 24 hr tablet Take 300 mg by mouth daily.  Marland Kitchen ELDERBERRY PO Take by mouth.  . levonorgestrel (MIRENA) 20 MCG/24HR IUD 1 each by Intrauterine route once.  Marland Kitchen spironolactone (ALDACTONE) 100 MG tablet Take by mouth.  . Vitamin D, Ergocalciferol, (DRISDOL) 1.25 MG (50000 UT) CAPS capsule Take 1 capsule (50,000 Units total) by mouth every 7 (seven) days.  . [DISCONTINUED]  cyclobenzaprine (FLEXERIL) 5 MG tablet Take by mouth.  . [DISCONTINUED] meloxicam (MOBIC) 15 MG tablet Take by mouth.  . [DISCONTINUED] metFORMIN (GLUCOPHAGE) 500 MG tablet Take 1 tablet (500 mg total) by mouth daily with breakfast.  . [DISCONTINUED] nitrofurantoin, macrocrystal-monohydrate, (MACROBID) 100 MG capsule Take 1 capsule (100 mg total) by mouth 2 (two) times daily.  . [DISCONTINUED] predniSONE (DELTASONE) 10 MG tablet Take 2 tablets (20 mg total) po x 5 days, then 1 tablet (10 mg total) po x 5 days.  . [DISCONTINUED] Prenatal Vit-Fe Fumarate-FA (PRENATAL MULTIVITAMIN) TABS tablet Take 1 tablet by mouth daily at 12 noon.   No facility-administered encounter medications on file as of 11/24/2018.     Self-harm Behaviors Risk Assessment Self-harm risk factors:   Patient endorses recent thoughts of harming self:    [name] Suicide Severity Rating Scale: No flowsheet data found.  Danger to Others Risk Assessment Danger to others risk factors:   Patient endorses recent thoughts of harming others:    Dynamic Appraisal of Situational Aggression (DASA): No flowsheet data found.  Substance Use Assessment Patient recently consumed alcohol:    Alcohol Use Disorder Identification Test (AUDIT): No flowsheet data found. Patient recently used drugs:    Opioid Risk Assessment:  Patient is concerned about dependence or abuse of substances:    ASAM Multidimensional Assessment Summary:  Dimension 1:    Dimension 1 Rating:    Dimension 2:    Dimension 2 Rating:    Dimension 3:    Dimension 3 Rating:    Dimension 4:    Dimension 4 Rating:    Dimension 5:    Dimension 5  Rating:    Dimension 6:    Dimension 6 Rating:   ASAM's Severity Rating Score:   ASAM Recommended Level of Treatment:     Goals, Interventions and Follow-up Plan Goals: Increase healthy adjustment to current life circumstances Interventions: Brief CBT Follow-up Plan: Refer to [facility]  Summary of  Clinical Assessment Summary: Rhonda [name] [name] is a 33 year old woman who is married, has 2 small children.  Rhonda [name] [name]'s mother lives in the home with her.  Rhonda [name] [name] has a family history of MH.  Her father diagnosed with depression and he abused alcohol.  She has family and friends support.  She works full time and in Writer school.  She has poor time management.  When she was approximately [age] she swallowed "a lot of Tylenol." She has been married for 8 years.  She states her husband can be a stressor.  She wants to improve her self esteem.  She denies having any hobbies and lacks self confidence.   Rhonda [name] [place], LCSW

## 2019-01-29 ENCOUNTER — Ambulatory Visit (INDEPENDENT_AMBULATORY_CARE_PROVIDER_SITE_OTHER): Payer: No Typology Code available for payment source | Admitting: Psychiatry

## 2019-01-29 ENCOUNTER — Other Ambulatory Visit: Payer: Self-pay

## 2019-01-29 ENCOUNTER — Encounter: Payer: Self-pay | Admitting: Psychiatry

## 2019-01-29 DIAGNOSIS — F411 Generalized anxiety disorder: Secondary | ICD-10-CM

## 2019-01-29 DIAGNOSIS — F330 Major depressive disorder, recurrent, mild: Secondary | ICD-10-CM | POA: Diagnosis not present

## 2019-01-29 MED ORDER — BUPROPION HCL ER (XL) 300 MG PO TB24: 300.0000 mg | ORAL_TABLET | Freq: Every day | ORAL | 0 refills | Status: DC

## 2019-01-29 NOTE — Progress Notes (Signed)
Virtual Visit via Video Note  I connected with Rhonda [name] on 01/29/19 at  4:30 PM EDT by a video enabled telemedicine application and verified that I am speaking with the correct person using two identifiers.   I discussed the limitations of evaluation and management by telemedicine and the availability of in person appointments. The patient expressed understanding and agreed to proceed.    I discussed the assessment and treatment plan with the patient. The patient was provided an opportunity to ask questions and all were answered. The patient agreed with the plan and demonstrated an understanding of the instructions.   The patient was advised to call back or seek an in-person evaluation if the symptoms worsen or if the condition fails to improve as anticipated.   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  Patient today reports she is currently doing well on the current medication regimen.  She denies any significant depressive symptoms or anxiety symptoms.  She is compliant on Wellbutrin.  She denies any side effects.  She also takes l-methylfolate which has been helpful.  She reports sleep is good.  She reports appetite is fair.  She reports she is worried about her weight and is planning to get established with weight management clinic.  She however reports due to the COVID-19 everything is getting delayed.  She would also like to exercise more however has not been able to find the time to do so.  Patient denies any suicidality, homicidality or perceptual disturbances.  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   Visit Diagnosis:    ICD-10-CM   1. MDD (major depressive disorder), recurrent  episode, mild ([facility])  F33.0 buPROPion (WELLBUTRIN XL) 300 MG 24 hr tablet   improving  2. GAD (generalized anxiety disorder)  F41.1     Past Psychiatric History: Reviewed past psychiatric history from my progress note on 11/05/2018.  Past trials of bupropion.  Past Medical History:  Past Medical History:  Diagnosis Date  . Depression    treated  . PCOS (polycystic ovarian syndrome) 2008  . Plantar fasciitis     Past Surgical History:  Procedure Laterality Date  . CHOLECYSTECTOMY      Family Psychiatric History: Reviewed family psychiatric history from my progress note on 11/05/2018.  Family History:  Family History  Problem Relation Age of Onset  . High blood pressure Mother   . Depression Father   . Liver disease Father   . Alcoholism Father   . Alcohol abuse Father   . Diabetes Paternal Grandmother   . Bipolar disorder Maternal Aunt     Social History: Reviewed social history from my progress note on 11/05/2018. Social History   Socioeconomic History  . Marital status: Married    Spouse name: [name]  . Number of children: 2  . Years of education: Not on file  . Highest education level: Bachelor's degree (e.g., BA, AB, BS)  Occupational History  . Occupation: Therapist, sports  Social Needs  . Financial resource strain: Not hard at all  . Food insecurity    Worry: Never true    Inability: Never true  . Transportation needs    Medical: No    Non-medical:  No  Tobacco Use  . Smoking status: Never Smoker  . Smokeless tobacco: Never Used  Substance and Sexual Activity  . Alcohol use: No  . Drug use: No  . Sexual activity: Yes    Birth control/protection: None  Lifestyle  . Physical activity    Days per week: 0 days    Minutes per session: 0 min  . Stress: Rather much  Relationships  . Social Herbalist on phone: Not on file    Gets together: Not on file    Attends religious service: More than 4 times per year    Active member of club or organization: No     Attends meetings of clubs or organizations: Never    Relationship status: Married  Other Topics Concern  . Not on file  Social History Narrative  . Not on file    Allergies: No Known Allergies  Metabolic Disorder Labs: Lab Results  Component Value Date   HGBA1C 5.2 10/09/2018   No results found for: PROLACTIN Lab Results  Component Value Date   CHOL 153 01/09/2018   TRIG 63 01/09/2018   HDL 43 01/09/2018   LDLCALC 97 01/09/2018   Lab Results  Component Value Date   TSH 1.470 01/09/2018    Therapeutic Level Labs: No results found for: LITHIUM No results found for: VALPROATE No components found for:  CBMZ  Current Medications: Current Outpatient Medications  Medication Sig Dispense Refill  . buPROPion (WELLBUTRIN XL) 300 MG 24 hr tablet Take 1 tablet (300 mg total) by mouth daily. 90 tablet 0  . ELDERBERRY PO Take by mouth.    . levonorgestrel (MIRENA) 20 MCG/24HR IUD 1 each by Intrauterine route once.    . metFORMIN (GLUCOPHAGE) 500 MG tablet Take 1 tablet (500 mg total) by mouth daily with breakfast. 30 tablet 0  . Multiple Vitamin (MULTIVITAMIN) tablet Take 1 tablet by mouth daily.    Marland Kitchen nystatin cream (MYCOSTATIN) Apply 1 application topically 2 (two) times daily. 30 g 0  . spironolactone (ALDACTONE) 100 MG tablet Take by mouth.    . Vitamin D, Ergocalciferol, (DRISDOL) 1.25 MG (50000 UT) CAPS capsule Take 1 capsule (50,000 Units total) by mouth every 7 (seven) days. 4 capsule 0   No current facility-administered medications for this visit.      Musculoskeletal: Strength & Muscle Tone: within normal limits Gait & Station: normal Patient leans: N/A  Psychiatric Specialty Exam: Review of Systems  Psychiatric/Behavioral: Negative for depression.  All other systems reviewed and are negative.   not currently breastfeeding.There is no height or weight on file to calculate BMI.  General Appearance: Casual  Eye Contact:  Fair  Speech:  Normal Rate  Volume:   Normal  Mood:  Euthymic  Affect:  Appropriate  Thought Process:  Goal Directed and Descriptions of Associations: Intact  Orientation:  Full (Time, Place, and Person)  Thought Content: Logical   Suicidal Thoughts:  No  Homicidal Thoughts:  No  Memory:  Immediate;   Fair Recent;   Fair Remote;   Fair  Judgement:  Fair  Insight:  Fair  Psychomotor Activity:  Normal  Concentration:  Concentration: Fair and Attention Span: Fair  Recall:  [employer] of Knowledge: Fair  Language: Fair  Akathisia:  No  Handed:  Right  AIMS (if indicated): denies tremors , rigidity  Assets:  Communication Skills Desire for Improvement Social Support  ADL's:  Intact  Cognition: WNL  Sleep:  Fair   Screenings:  PHQ2-9     Office Visit from 01/09/2018 in [place]  PHQ-2 Total Score  3  PHQ-9 Total Score  13       Assessment and Plan: Rhonda [name] is a 33 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  Patient is biologically predisposed given her history of mental health problems in her family.  She also has psychosocial stressors of being in school, job related stressors, having young children who needs her help.  Patient is currently making progress on the current medication regimen.  Plan as noted below.  Plan MDD-improving Wellbutrin XL 300 mg p.o. daily Patient was referred for CBT Continue l-methylfolate 7.5 mg for folic acid conversion reduction.   For GAD-improving Patient referred for CBT  GeneSight testing results were reviewed while making medication changes.  Follow up in clinic in 6 weeks or sooner if needed.August 26 th , 4:30 pm  I have spent atleast 15 minutes non face to face with patient today. More than 50 % of the time was spent for psychoeducation and supportive psychotherapy and care coordination.  This note was generated in part or whole with voice recognition software. Voice  recognition is usually quite accurate but there are transcription errors that can and very often do occur. I apologize for any typographical errors that were not detected and corrected.          Ursula Alert, MD 01/29/2019, 5:08 PM

## 2019-02-27 ENCOUNTER — Telehealth: Payer: Self-pay

## 2019-02-27 DIAGNOSIS — F411 Generalized anxiety disorder: Secondary | ICD-10-CM

## 2019-02-27 DIAGNOSIS — F330 Major depressive disorder, recurrent, mild: Secondary | ICD-10-CM

## 2019-02-27 MED ORDER — BUSPIRONE HCL 10 MG PO TABS: 5.0000 mg | ORAL_TABLET | Freq: Two times a day (BID) | ORAL | 0 refills | Status: DC

## 2019-02-27 NOTE — Telephone Encounter (Signed)
pt called states she would like to start the buspar that you two had discussed.

## 2019-02-27 NOTE — Telephone Encounter (Signed)
Returned call to patient. She is feeling depressed. Would like to add buspar.

## 2019-03-23 ENCOUNTER — Encounter (INDEPENDENT_AMBULATORY_CARE_PROVIDER_SITE_OTHER): Payer: Self-pay | Admitting: Physician Assistant

## 2019-03-23 ENCOUNTER — Telehealth (INDEPENDENT_AMBULATORY_CARE_PROVIDER_SITE_OTHER): Payer: No Typology Code available for payment source | Admitting: Physician Assistant

## 2019-03-23 ENCOUNTER — Other Ambulatory Visit: Payer: Self-pay

## 2019-03-23 DIAGNOSIS — E8881 Metabolic syndrome: Secondary | ICD-10-CM

## 2019-03-23 DIAGNOSIS — E559 Vitamin D deficiency, unspecified: Secondary | ICD-10-CM | POA: Diagnosis not present

## 2019-03-23 DIAGNOSIS — Z6839 Body mass index (BMI) 39.0-39.9, adult: Secondary | ICD-10-CM

## 2019-03-23 MED ORDER — VITAMIN D (ERGOCALCIFEROL) 1.25 MG (50000 UNIT) PO CAPS: 50000.0000 [IU] | ORAL_CAPSULE | ORAL | 0 refills | Status: DC

## 2019-03-23 MED ORDER — METFORMIN HCL 500 MG PO TABS: 500.0000 mg | ORAL_TABLET | Freq: Every day | ORAL | 0 refills | Status: DC

## 2019-03-24 ENCOUNTER — Encounter (INDEPENDENT_AMBULATORY_CARE_PROVIDER_SITE_OTHER): Payer: Self-pay

## 2019-03-24 ENCOUNTER — Encounter (INDEPENDENT_AMBULATORY_CARE_PROVIDER_SITE_OTHER): Payer: Self-pay | Admitting: Physician Assistant

## 2019-03-24 NOTE — Progress Notes (Signed)
Office: [phone]  /  Fax: [phone] TeleHealth Visit:  Rhonda [name] has verbally consented to this TeleHealth visit today. The patient is located at home, the provider is located at the [facility] office. The participants in this visit include the listed provider and patient. The visit was conducted today via Webex.  HPI:   Chief Complaint: OBESITY Rhonda [name] is here to discuss her progress with her obesity treatment plan. She is on the Category 2 plan and is following her eating plan approximately 0% of the time. She states she is exercising 0 minutes 0 times per week. Rhonda [name] reports her most recent weight to be 265 lbs. She has not been following the plan at all over the last few months and is ready to restart.  We were unable to weigh the patient today for this TeleHealth visit. She feels as if she has gained weight since her last visit. She has lost 20 lbs since starting treatment with [name].  Vitamin D deficiency Rhonda [name] has a diagnosis of Vitamin D deficiency. She is currently taking prescription Vit D and denies nausea, vomiting or muscle weakness.  Pre-Diabetes Rhonda [name] has a diagnosis of prediabetes based on her elevated Hgb A1c and was informed this puts her at greater risk of developing diabetes. She is not taking metformin currently and reports increased hunger since running out of metformin. She denies nausea, vomiting, or diarrhea.  ASSESSMENT AND PLAN:  Vitamin D deficiency - Plan: Vitamin D, Ergocalciferol, (DRISDOL) 1.25 MG (50000 UT) CAPS capsule  Insulin resistance - Plan: metFORMIN (GLUCOPHAGE) 500 MG tablet  Class 2 severe obesity with serious comorbidity and body mass index (BMI) of 39.0 to 39.9 in adult, unspecified obesity type ([name])  PLAN:  Vitamin D Deficiency Rhonda [name] was informed that low Vitamin D levels contributes to fatigue and are associated with obesity, breast, and colon cancer. She agrees to continue to take prescription Vit D @  50,000 IU every week #4 with 0 refills and will follow-up for routine testing of Vitamin D, at least 2-3 times per year. She was informed of the risk of over-replacement of Vitamin D and agrees to not increase her dose unless she discusses this with [name] first. Rhonda [name] agrees to follow-up with our clinic in 2 weeks.  Pre-Diabetes Rhonda [name] will continue to work on weight loss, exercise, and decreasing simple carbohydrates in her diet to help decrease the risk of diabetes. We dicussed metformin including benefits and risks. She was informed that eating too many simple carbohydrates or too many calories at one sitting increases the likelihood of GI side effects. Rhonda [name] was given a refill on her metformin #30 with 0 refills. She agrees to follow-up with our clinic in 2 weeks.  Obesity Rhonda [name] is currently in the action stage of change. As such, her goal is to continue with weight loss efforts. She has agreed to follow the Category 2 plan. Rhonda [name] has been instructed to work up to a goal of 150 minutes of combined cardio and strengthening exercise per week for weight loss and overall health benefits. We discussed the following Behavioral Modification Strategies today: work on meal planning and easy cooking plans, and keeping healthy foods in the home.  Rhonda [name] has agreed to follow-up with our clinic in 2 weeks. She was informed of the importance of frequent follow-up visits to maximize her success with intensive lifestyle modifications for her multiple health conditions.  ALLERGIES: No Known Allergies  MEDICATIONS: Current Outpatient Medications on File Prior to Visit  Medication Sig Dispense Refill   buPROPion (WELLBUTRIN XL) 300 MG 24 hr tablet Take 1 tablet (300 mg total) by mouth daily. 90 tablet 0   busPIRone (BUSPAR) 10 MG tablet Take 0.5 tablets (5 mg total) by mouth 2 (two) times daily. 30 tablet 0   ELDERBERRY PO Take by mouth.     levonorgestrel (MIRENA) 20 MCG/24HR IUD 1 each by  Intrauterine route once.     Multiple Vitamin (MULTIVITAMIN) tablet Take 1 tablet by mouth daily.     nystatin cream (MYCOSTATIN) Apply 1 application topically 2 (two) times daily. 30 g 0   spironolactone (ALDACTONE) 100 MG tablet Take by mouth.     No current facility-administered medications on file prior to visit.     PAST MEDICAL HISTORY: Past Medical History:  Diagnosis Date   Depression    treated   PCOS (polycystic ovarian syndrome) 2008   Plantar fasciitis     PAST SURGICAL HISTORY: Past Surgical History:  Procedure Laterality Date   CHOLECYSTECTOMY      SOCIAL HISTORY: Social History   Tobacco Use   Smoking status: Never Smoker   Smokeless tobacco: Never Used  Substance Use Topics   Alcohol use: No   Drug use: No    FAMILY HISTORY: Family History  Problem Relation Age of Onset   High blood pressure Mother    Depression Father    Liver disease Father    Alcoholism Father    Alcohol abuse Father    Diabetes Paternal Grandmother    Bipolar disorder Maternal Aunt    ROS: Review of Systems  Gastrointestinal: Negative for diarrhea, nausea and vomiting.  Musculoskeletal:       Negative for muscle weakness.   PHYSICAL EXAM: Pt in no acute distress  RECENT LABS AND TESTS: BMET    Component Value Date/Time   NA 139 10/09/2018 0902   NA 142 08/06/2014 0215   K 4.6 10/09/2018 0902   K 3.5 08/06/2014 0215   CL 102 10/09/2018 0902   CL 108 (H) 08/06/2014 0215   CO2 23 10/09/2018 0902   CO2 20 (L) 08/06/2014 0215   GLUCOSE 91 10/09/2018 0902   GLUCOSE 74 09/18/2016 1804   GLUCOSE 113 (H) 08/06/2014 0215   BUN 13 10/09/2018 0902   BUN 15 08/06/2014 0215   CREATININE 0.95 10/09/2018 0902   CREATININE 1.00 08/06/2014 0215   CALCIUM 9.6 10/09/2018 0902   CALCIUM 9.3 08/06/2014 0215   GFRNONAA 79 10/09/2018 0902   GFRNONAA >60 08/06/2014 0215   GFRAA 91 10/09/2018 0902   GFRAA >60 08/06/2014 0215   Lab Results  Component Value  Date   HGBA1C 5.2 10/09/2018   HGBA1C 5.2 01/09/2018   Lab Results  Component Value Date   INSULIN 13.0 10/09/2018   INSULIN 16.5 01/09/2018   CBC    Component Value Date/Time   WBC 4.7 01/09/2018 1116   WBC 7.8 09/20/2016 0621   RBC 4.75 01/09/2018 1116   RBC 3.81 09/20/2016 0621   HGB 14.5 01/09/2018 1116   HCT 42.1 01/09/2018 1116   PLT 195 09/20/2016 0621   PLT 300 08/06/2014 0215   MCV 89 01/09/2018 1116   MCV 92 08/06/2014 0215   MCH 30.5 01/09/2018 1116   MCH 31.4 09/20/2016 0621   MCHC 34.4 01/09/2018 1116   MCHC 35.2 09/20/2016 0621   RDW 13.7 01/09/2018 1116   RDW 13.3 08/06/2014 0215   LYMPHSABS 1.3 01/09/2018 1116   LYMPHSABS 0.4 (L) 08/06/2014 0215  MONOABS 0.7 08/06/2014 0215   EOSABS 0.1 01/09/2018 1116   EOSABS 0.1 08/06/2014 0215   BASOSABS 0.0 01/09/2018 1116   BASOSABS 0.2 (H) 08/06/2014 0215   Iron/TIBC/Ferritin/ %Sat No results found for: IRON, TIBC, FERRITIN, IRONPCTSAT Lipid Panel     Component Value Date/Time   CHOL 153 01/09/2018 1116   TRIG 63 01/09/2018 1116   HDL 43 01/09/2018 1116   LDLCALC 97 01/09/2018 1116   Hepatic Function Panel     Component Value Date/Time   PROT 7.6 10/09/2018 0902   PROT 8.5 (H) 08/06/2014 0215   ALBUMIN 4.7 10/09/2018 0902   ALBUMIN 4.3 08/06/2014 0215   AST 25 10/09/2018 0902   AST 33 08/06/2014 0215   ALT 39 (H) 10/09/2018 0902   ALT 58 08/06/2014 0215   ALKPHOS 76 10/09/2018 0902   ALKPHOS 115 08/06/2014 0215   BILITOT 0.5 10/09/2018 0902   BILITOT 0.7 08/06/2014 0215      Component Value Date/Time   TSH 1.470 01/09/2018 1116   Results for REYA, AURICH (MRN [id number]) as of 03/24/2019 08:41  Ref. Range 10/09/2018 09:02  Vitamin D, 25-Hydroxy Latest Ref Range: 30.0 - 100.0 ng/mL 35.8    I, Michaelene Song, am acting as Location manager for [employer], PA-C I, Abby Potash, PA-C have reviewed above note and agree with its content

## 2019-03-31 ENCOUNTER — Telehealth: Payer: Self-pay

## 2019-03-31 DIAGNOSIS — F411 Generalized anxiety disorder: Secondary | ICD-10-CM

## 2019-03-31 DIAGNOSIS — F330 Major depressive disorder, recurrent, mild: Secondary | ICD-10-CM

## 2019-03-31 MED ORDER — BUSPIRONE HCL 10 MG PO TABS: 5.0000 mg | ORAL_TABLET | Freq: Two times a day (BID) | ORAL | 1 refills | Status: DC

## 2019-03-31 NOTE — Telephone Encounter (Signed)
Sent buspar to pharmacy

## 2019-03-31 NOTE — Telephone Encounter (Signed)
pt called states she needs refills on her buspar.     busPIRone (BUSPAR) 10 MG tablet Medication Date: 02/27/2019 Department: [facility] Ordering/Authorizing: Ursula Alert, MD  Order Providers  Prescribing Provider Encounter Provider  Ursula Alert, MD Carlynn Purl, CMA  Outpatient Medication Detail   Disp Refills Start End   busPIRone (BUSPAR) 10 MG tablet 30 tablet 0 02/27/2019    Sig - Route: Take 0.5 tablets (5 mg total) by mouth 2 (two) times daily. - Oral   Sent to pharmacy as: busPIRone (BUSPAR) 10 MG tablet   E-Prescribing Status: Receipt confirmed by pharmacy (02/27/2019 12:31 PM EDT)   Associated Diagnoses  MDD (major depressive disorder), recurrent episode, mild ([place]) - Primary     GAD (generalized anxiety disorder)     Pharmacy  [place], [place]

## 2019-04-07 ENCOUNTER — Other Ambulatory Visit: Payer: Self-pay

## 2019-04-07 ENCOUNTER — Ambulatory Visit (INDEPENDENT_AMBULATORY_CARE_PROVIDER_SITE_OTHER): Payer: No Typology Code available for payment source | Admitting: Physician Assistant

## 2019-04-07 VITALS — BP 113/64 | Temp 98.3°F | Ht 70.0 in | Wt 264.6 lb

## 2019-04-07 DIAGNOSIS — E559 Vitamin D deficiency, unspecified: Secondary | ICD-10-CM

## 2019-04-07 DIAGNOSIS — E8881 Metabolic syndrome: Secondary | ICD-10-CM | POA: Diagnosis not present

## 2019-04-07 DIAGNOSIS — Z9189 Other specified personal risk factors, not elsewhere classified: Secondary | ICD-10-CM | POA: Diagnosis not present

## 2019-04-07 DIAGNOSIS — E88819 Insulin resistance, unspecified: Secondary | ICD-10-CM

## 2019-04-07 DIAGNOSIS — Z6841 Body Mass Index (BMI) 40.0 and over, adult: Secondary | ICD-10-CM

## 2019-04-08 NOTE — Progress Notes (Signed)
Office: [phone]  /  Fax: [phone]   HPI:   Chief Complaint: OBESITY Coretha is here to discuss her progress with her obesity treatment plan. She is on the Category 2 plan and is following her eating plan approximately 60% of the time. She states she is exercising 0 minutes 0 times per week. Myeshia reports that she did really well with the plan until this past weekend when she traveled. She is eating out less and ready to get back on track.  Her weight is 264 lb 9.6 oz (120 kg) today and has had a weight gain of 11 lbs since her last visit. She has lost 9 lbs since starting treatment with [name].  Vitamin D deficiency Nil has a diagnosis of Vitamin D deficiency. She is currently taking Vit D and denies nausea, vomiting or muscle weakness.  At risk for osteopenia and osteoporosis Makari is at higher risk of osteopenia and osteoporosis due to Vitamin D deficiency.   Insulin Resistance Payslie has a diagnosis of insulin resistance based on her elevated fasting insulin level >5. Although Jessina's blood glucose readings are still under good control, insulin resistance puts her at greater risk of metabolic syndrome and diabetes. She is taking metformin currently and continues to work on diet and exercise to decrease risk of diabetes. No nausea, vomiting, or diarrhea. No polyphagia.  ASSESSMENT AND PLAN:  Vitamin D deficiency - Plan: VITAMIN D 25 Hydroxy (Vit-D Deficiency, Fractures)  Insulin resistance - Plan: Hemoglobin A1c, Insulin, random, Comprehensive metabolic panel  At risk for osteoporosis  Class 3 severe obesity with serious comorbidity and body mass index (BMI) of 50.0 to 59.9 in adult, unspecified obesity type ([place])  PLAN:  Vitamin D Deficiency Kaydyn was informed that low Vitamin D levels contributes to fatigue and are associated with obesity, breast, and colon cancer. She agrees to continue taking Vit D and will have routine testing of Vitamin D. She was  informed of the risk of over-replacement of Vitamin D and agrees to not increase her dose unless she discusses this with [name] first. Angellica agrees to follow-up with our clinic in 2 weeks.  At risk for osteopenia and osteoporosis Martia was given extended  (15 minutes) osteoporosis prevention counseling today. Stepanie is at risk for osteopenia and osteoporosis due to her Vitamin D deficiency. She was encouraged to take her Vitamin D and follow her higher calcium diet and increase strengthening exercise to help strengthen her bones and decrease her risk of osteopenia and osteoporosis.  Insulin Resistance Tahirah will continue to work on weight loss, exercise, and decreasing simple carbohydrates in her diet to help decrease the risk of diabetes. We dicussed metformin including benefits and risks. She was informed that eating too many simple carbohydrates or too many calories at one sitting increases the likelihood of GI side effects. Anupama will continue with metformin and weight loss. She will have labs checked and follow-up with [name] as directed to monitor her progress.  Obesity Emberly is currently in the action stage of change. As such, her goal is to continue with weight loss efforts. She has agreed to follow the Category 2 plan. Zuha has been instructed to work up to a goal of 150 minutes of combined cardio and strengthening exercise per week for weight loss and overall health benefits. We discussed the following Behavioral Modification Strategies today: work on meal planning and easy cooking plans, and keeping healthy foods in the home.  Pierina has agreed to follow-up with our clinic in  2 weeks. She was informed of the importance of frequent follow-up visits to maximize her success with intensive lifestyle modifications for her multiple health conditions.  ALLERGIES: No Known Allergies  MEDICATIONS: Current Outpatient Medications on File Prior to Visit  Medication Sig Dispense Refill     buPROPion (WELLBUTRIN XL) 300 MG 24 hr tablet Take 1 tablet (300 mg total) by mouth daily. 90 tablet 0   busPIRone (BUSPAR) 10 MG tablet Take 0.5 tablets (5 mg total) by mouth 2 (two) times daily. 30 tablet 1   ELDERBERRY PO Take by mouth.     levonorgestrel (MIRENA) 20 MCG/24HR IUD 1 each by Intrauterine route once.     metFORMIN (GLUCOPHAGE) 500 MG tablet Take 1 tablet (500 mg total) by mouth daily with breakfast. 30 tablet 0   Multiple Vitamin (MULTIVITAMIN) tablet Take 1 tablet by mouth daily.     nystatin cream (MYCOSTATIN) Apply 1 application topically 2 (two) times daily. 30 g 0   spironolactone (ALDACTONE) 100 MG tablet Take by mouth.     Vitamin D, Ergocalciferol, (DRISDOL) 1.25 MG (50000 UT) CAPS capsule Take 1 capsule (50,000 Units total) by mouth every 7 (seven) days. 4 capsule 0   No current facility-administered medications on file prior to visit.     PAST MEDICAL HISTORY: Past Medical History:  Diagnosis Date   Depression    treated   PCOS (polycystic ovarian syndrome) 2008   Plantar fasciitis     PAST SURGICAL HISTORY: Past Surgical History:  Procedure Laterality Date   CHOLECYSTECTOMY      SOCIAL HISTORY: Social History   Tobacco Use   Smoking status: Never Smoker   Smokeless tobacco: Never Used  Substance Use Topics   Alcohol use: No   Drug use: No    FAMILY HISTORY: Family History  Problem Relation Age of Onset   High blood pressure Mother    Depression Father    Liver disease Father    Alcoholism Father    Alcohol abuse Father    Diabetes Paternal Grandmother    Bipolar disorder Maternal Aunt    ROS: Review of Systems  Gastrointestinal: Negative for diarrhea, nausea and vomiting.  Musculoskeletal:       Negative for muscle weakness.  Endo/Heme/Allergies:       Negative for polyphagia.   PHYSICAL EXAM: Blood pressure 113/64, temperature 98.3 F (36.8 C), height 5\' 10"  (1.778 m), weight 264 lb 9.6 oz (120 kg),  SpO2 99 %. Body mass index is 37.97 kg/m. Physical Exam Vitals signs reviewed.  Constitutional:      Appearance: Normal appearance. She is obese.  Cardiovascular:     Rate and Rhythm: Normal rate.     Pulses: Normal pulses.  Pulmonary:     Effort: Pulmonary effort is normal.     Breath sounds: Normal breath sounds.  Musculoskeletal: Normal range of motion.  Skin:    General: Skin is warm and dry.  Neurological:     Mental Status: She is alert and oriented to person, place, and time.  Psychiatric:        Behavior: Behavior normal.   RECENT LABS AND TESTS: BMET    Component Value Date/Time   NA 139 10/09/2018 0902   NA 142 08/06/2014 0215   K 4.6 10/09/2018 0902   K 3.5 08/06/2014 0215   CL 102 10/09/2018 0902   CL 108 (H) 08/06/2014 0215   CO2 23 10/09/2018 0902   CO2 20 (L) 08/06/2014 0215   GLUCOSE 91 10/09/2018  0902   GLUCOSE 74 09/18/2016 1804   GLUCOSE 113 (H) 08/06/2014 0215   BUN 13 10/09/2018 0902   BUN 15 08/06/2014 0215   CREATININE 0.95 10/09/2018 0902   CREATININE 1.00 08/06/2014 0215   CALCIUM 9.6 10/09/2018 0902   CALCIUM 9.3 08/06/2014 0215   GFRNONAA 79 10/09/2018 0902   GFRNONAA >60 08/06/2014 0215   GFRAA 91 10/09/2018 0902   GFRAA >60 08/06/2014 0215   Lab Results  Component Value Date   HGBA1C 5.2 10/09/2018   HGBA1C 5.2 01/09/2018   Lab Results  Component Value Date   INSULIN 13.0 10/09/2018   INSULIN 16.5 01/09/2018   CBC    Component Value Date/Time   WBC 4.7 01/09/2018 1116   WBC 7.8 09/20/2016 0621   RBC 4.75 01/09/2018 1116   RBC 3.81 09/20/2016 0621   HGB 14.5 01/09/2018 1116   HCT 42.1 01/09/2018 1116   PLT 195 09/20/2016 0621   PLT 300 08/06/2014 0215   MCV 89 01/09/2018 1116   MCV 92 08/06/2014 0215   MCH 30.5 01/09/2018 1116   MCH 31.4 09/20/2016 0621   MCHC 34.4 01/09/2018 1116   MCHC 35.2 09/20/2016 0621   RDW 13.7 01/09/2018 1116   RDW 13.3 08/06/2014 0215   LYMPHSABS 1.3 01/09/2018 1116   LYMPHSABS 0.4 (L)  08/06/2014 0215   MONOABS 0.7 08/06/2014 0215   EOSABS 0.1 01/09/2018 1116   EOSABS 0.1 08/06/2014 0215   BASOSABS 0.0 01/09/2018 1116   BASOSABS 0.2 (H) 08/06/2014 0215   Iron/TIBC/Ferritin/ %Sat No results found for: IRON, TIBC, FERRITIN, IRONPCTSAT Lipid Panel     Component Value Date/Time   CHOL 153 01/09/2018 1116   TRIG 63 01/09/2018 1116   HDL 43 01/09/2018 1116   LDLCALC 97 01/09/2018 1116   Hepatic Function Panel     Component Value Date/Time   PROT 7.6 10/09/2018 0902   PROT 8.5 (H) 08/06/2014 0215   ALBUMIN 4.7 10/09/2018 0902   ALBUMIN 4.3 08/06/2014 0215   AST 25 10/09/2018 0902   AST 33 08/06/2014 0215   ALT 39 (H) 10/09/2018 0902   ALT 58 08/06/2014 0215   ALKPHOS 76 10/09/2018 0902   ALKPHOS 115 08/06/2014 0215   BILITOT 0.5 10/09/2018 0902   BILITOT 0.7 08/06/2014 0215      Component Value Date/Time   TSH 1.470 01/09/2018 1116   Results for MELIA, HOPES (MRN [id number]) as of 04/08/2019 16:33  Ref. Range 10/09/2018 09:02  Vitamin D, 25-Hydroxy Latest Ref Range: 30.0 - 100.0 ng/mL 35.8   OBESITY BEHAVIORAL INTERVENTION VISIT  Today's visit was #17   Starting weight: 273 lbs Starting date: 01/09/2018 Today's weight: 264 lbs Today's date: 04/07/2019 Total lbs lost to date: 9    04/07/2019  Height 5\' 10"  (1.778 m)  Weight 264 lb 9.6 oz (120 kg)  BMI (Calculated) 37.97  BLOOD PRESSURE - SYSTOLIC 626  BLOOD PRESSURE - DIASTOLIC 64   Body Fat % 94.8 %  Total Body Water (lbs) 94.21 lbs   ASK: We discussed the diagnosis of obesity with Karma Greaser today and Jordyan agreed to give [name] permission to discuss obesity behavioral modification therapy today.  ASSESS: Brizza has the diagnosis of obesity and her BMI today is 50.0. Gayla is in the action stage of change.   ADVISE: Rheba was educated on the multiple health risks of obesity as well as the benefit of weight loss to improve her health. She was advised of the need for long term  treatment and the importance of lifestyle modifications to improve her current health and to decrease her risk of future health problems.  AGREE: Multiple dietary modification options and treatment options were discussed and  Cassadee agreed to follow the recommendations documented in the above note.  ARRANGE: Kitti was educated on the importance of frequent visits to treat obesity as outlined per CMS and USPSTF guidelines and agreed to schedule her next follow up appointment today.  Migdalia Dk, am acting as transcriptionist for Abby Potash, PA-C I, Abby Potash, PA-C have reviewed above note and agree with its content

## 2019-04-15 ENCOUNTER — Ambulatory Visit: Payer: No Typology Code available for payment source | Admitting: Psychiatry

## 2019-04-21 LAB — COMPREHENSIVE METABOLIC PANEL
ALT: 36 IU/L — ABNORMAL HIGH (ref 0–32)
AST: 27 IU/L (ref 0–40)
Albumin/Globulin Ratio: 1.7 (ref 1.2–2.2)
Albumin: 4.5 g/dL (ref 3.8–4.8)
Alkaline Phosphatase: 64 IU/L (ref 39–117)
BUN/Creatinine Ratio: 13 (ref 9–23)
BUN: 12 mg/dL (ref 6–20)
Bilirubin Total: 0.3 mg/dL (ref 0.0–1.2)
CO2: 22 mmol/L (ref 20–29)
Calcium: 9.7 mg/dL (ref 8.7–10.2)
Chloride: 104 mmol/L (ref 96–106)
Creatinine, Ser: 0.96 mg/dL (ref 0.57–1.00)
GFR calc Af Amer: 90 mL/min/{1.73_m2} (ref >59)
GFR calc non Af Amer: 78 mL/min/{1.73_m2} (ref >59)
Globulin, Total: 2.6 g/dL (ref 1.5–4.5)
Glucose: 82 mg/dL (ref 65–99)
Potassium: 4.4 mmol/L (ref 3.5–5.2)
Sodium: 139 mmol/L (ref 134–144)
Total Protein: 7.1 g/dL (ref 6.0–8.5)

## 2019-04-21 LAB — INSULIN, RANDOM: INSULIN: 13.8 u[IU]/mL (ref 2.6–24.9)

## 2019-04-21 LAB — HEMOGLOBIN A1C
Est. average glucose Bld gHb Est-mCnc: 100 mg/dL
Hgb A1c MFr Bld: 5.1 % (ref 4.8–5.6)

## 2019-04-21 LAB — VITAMIN D 25 HYDROXY (VIT D DEFICIENCY, FRACTURES): Vit D, 25-Hydroxy: 34.9 ng/mL (ref 30.0–100.0)

## 2019-04-22 ENCOUNTER — Other Ambulatory Visit: Payer: Self-pay

## 2019-04-22 ENCOUNTER — Encounter (INDEPENDENT_AMBULATORY_CARE_PROVIDER_SITE_OTHER): Payer: Self-pay | Admitting: Physician Assistant

## 2019-04-22 ENCOUNTER — Ambulatory Visit (INDEPENDENT_AMBULATORY_CARE_PROVIDER_SITE_OTHER): Payer: No Typology Code available for payment source | Admitting: Physician Assistant

## 2019-04-22 VITALS — BP 115/75 | HR 79 | Temp 97.8°F | Ht 70.0 in | Wt 261.0 lb

## 2019-04-22 DIAGNOSIS — Z6837 Body mass index (BMI) 37.0-37.9, adult: Secondary | ICD-10-CM

## 2019-04-22 DIAGNOSIS — E559 Vitamin D deficiency, unspecified: Secondary | ICD-10-CM | POA: Diagnosis not present

## 2019-04-22 DIAGNOSIS — Z9189 Other specified personal risk factors, not elsewhere classified: Secondary | ICD-10-CM

## 2019-04-22 DIAGNOSIS — E8881 Metabolic syndrome: Secondary | ICD-10-CM

## 2019-04-22 MED ORDER — VITAMIN D (ERGOCALCIFEROL) 1.25 MG (50000 UNIT) PO CAPS: 50000.0000 [IU] | ORAL_CAPSULE | ORAL | 0 refills | Status: DC

## 2019-04-23 NOTE — Progress Notes (Signed)
Office: [phone]  /  Fax: [phone]   HPI:   Chief Complaint: OBESITY Rhonda [name] is here to discuss her progress with her obesity treatment plan. She is on the Category 2 plan and is following her eating plan approximately 75 % of the time. She states she is exercising 0 minutes 0 times per week. Rhonda [name] reports that she has done well during the week, but she struggles with snacking on the weekend, especially with dinner. Her weight is 261 lb (118.4 kg) today and has had a weight loss of 3 pounds over a period of 2 weeks since her last visit. She has lost 12 lbs since starting treatment with [name].  Vitamin D deficiency Rhonda [name] has a diagnosis of vitamin D deficiency. She is currently taking vit D and denies nausea, vomiting or muscle weakness.  At risk for osteopenia and osteoporosis Rhonda [name] is at higher risk of osteopenia and osteoporosis due to vitamin D deficiency.   Insulin Resistance Rhonda [name] has a diagnosis of insulin resistance based on her elevated fasting insulin level >5. Although Infant's blood glucose readings are still under good control, insulin resistance puts her at greater risk of metabolic syndrome and diabetes. Rhonda [name] is taking metformin and she denies nausea, vomiting or diarrhea. She continues to work on diet and exercise to decrease risk of diabetes. Rhonda [name] denies polyphagia.  ASSESSMENT AND PLAN:  Vitamin D deficiency - Plan: Vitamin D, Ergocalciferol, (DRISDOL) 1.25 MG (50000 UT) CAPS capsule  Insulin resistance  At risk for osteoporosis  Class 2 severe obesity with serious comorbidity and body mass index (BMI) of 37.0 to 37.9 in adult, unspecified obesity type ([place])  PLAN:  Vitamin D Deficiency Rhonda [name] was informed that low vitamin D levels contributes to fatigue and are associated with obesity, breast, and colon cancer. She agrees to continue to take prescription Vit D @50 ,000 IU every week #4 with no refills and will follow up for routine testing of  vitamin D, at least 2-3 times per year. She was informed of the risk of over-replacement of vitamin D and agrees to not increase her dose unless she discusses this with [name] first. Rhonda [name] agrees to follow up with our clinic in 2 weeks.  At risk for osteopenia and osteoporosis Rhonda [name] was given extended  (15 minutes) osteoporosis prevention counseling today. Rhonda [name] is at risk for osteopenia and osteoporosis due to her vitamin D deficiency. She was encouraged to take her vitamin D and follow her higher calcium diet and increase strengthening exercise to help strengthen her bones and decrease her risk of osteopenia and osteoporosis.  Insulin Resistance Rhonda [name] will continue to work on weight loss, exercise, and decreasing simple carbohydrates in her diet to help decrease the risk of diabetes. We dicussed metformin including benefits and risks. She was informed that eating too many simple carbohydrates or too many calories at one sitting increases the likelihood of GI side effects. Rhonda [name] will continue metformin for now and prescription was not written today. Rhonda [name] agreed to follow up with [name] as directed to monitor her progress.  Obesity Rhonda [name] is currently in the action stage of change. As such, her goal is to continue with weight loss efforts She has agreed to follow the Category 2 plan Rhonda [name] has been instructed to work up to a goal of 150 minutes of combined cardio and strengthening exercise per week for weight loss and overall health benefits. We discussed the following Behavioral Modification Strategies today: keeping healthy foods in the home and work on meal planning and  easy cooking plans  Rhonda [name] has agreed to follow up with our clinic in 2 weeks. She was informed of the importance of frequent follow up visits to maximize her success with intensive lifestyle modifications for her multiple health conditions.  ALLERGIES: No Known Allergies  MEDICATIONS: Current Outpatient Medications  on File Prior to Visit  Medication Sig Dispense Refill   buPROPion (WELLBUTRIN XL) 300 MG 24 hr tablet Take 1 tablet (300 mg total) by mouth daily. 90 tablet 0   busPIRone (BUSPAR) 10 MG tablet Take 0.5 tablets (5 mg total) by mouth 2 (two) times daily. 30 tablet 1   ELDERBERRY PO Take by mouth.     levonorgestrel (MIRENA) 20 MCG/24HR IUD 1 each by Intrauterine route once.     metFORMIN (GLUCOPHAGE) 500 MG tablet Take 1 tablet (500 mg total) by mouth daily with breakfast. 30 tablet 0   Multiple Vitamin (MULTIVITAMIN) tablet Take 1 tablet by mouth daily.     nystatin cream (MYCOSTATIN) Apply 1 application topically 2 (two) times daily. 30 g 0   spironolactone (ALDACTONE) 100 MG tablet Take by mouth.     No current facility-administered medications on file prior to visit.     PAST MEDICAL HISTORY: Past Medical History:  Diagnosis Date   Depression    treated   PCOS (polycystic ovarian syndrome) 2008   Plantar fasciitis     PAST SURGICAL HISTORY: Past Surgical History:  Procedure Laterality Date   CHOLECYSTECTOMY      SOCIAL HISTORY: Social History   Tobacco Use   Smoking status: Never Smoker   Smokeless tobacco: Never Used  Substance Use Topics   Alcohol use: No   Drug use: No    FAMILY HISTORY: Family History  Problem Relation Age of Onset   High blood pressure Mother    Depression Father    Liver disease Father    Alcoholism Father    Alcohol abuse Father    Diabetes Paternal Grandmother    Bipolar disorder Maternal Aunt     ROS: Review of Systems  Constitutional: Positive for weight loss.  Gastrointestinal: Negative for diarrhea, nausea and vomiting.  Musculoskeletal:       Negative for muscle weakness  Endo/Heme/Allergies:       Negative for polyphagia    PHYSICAL EXAM: Blood pressure 115/75, pulse 79, temperature 97.8 F (36.6 C), temperature source Oral, height 5\' 10"  (1.778 m), weight 261 lb (118.4 kg), SpO2 96 %. Body mass  index is 37.45 kg/m. Physical Exam Vitals signs reviewed.  Constitutional:      Appearance: Normal appearance. She is well-developed. She is obese.  Cardiovascular:     Rate and Rhythm: Normal rate.  Pulmonary:     Effort: Pulmonary effort is normal.  Musculoskeletal: Normal range of motion.  Skin:    General: Skin is warm and dry.  Neurological:     Mental Status: She is alert and oriented to person, place, and time.  Psychiatric:        Mood and Affect: Mood normal.        Behavior: Behavior normal.     RECENT LABS AND TESTS: BMET    Component Value Date/Time   NA 139 04/20/2019 0830   NA 142 08/06/2014 0215   K 4.4 04/20/2019 0830   K 3.5 08/06/2014 0215   CL 104 04/20/2019 0830   CL 108 (H) 08/06/2014 0215   CO2 22 04/20/2019 0830   CO2 20 (L) 08/06/2014 0215   GLUCOSE 82 04/20/2019 0830  GLUCOSE 74 09/18/2016 1804   GLUCOSE 113 (H) 08/06/2014 0215   BUN 12 04/20/2019 0830   BUN 15 08/06/2014 0215   CREATININE 0.96 04/20/2019 0830   CREATININE 1.00 08/06/2014 0215   CALCIUM 9.7 04/20/2019 0830   CALCIUM 9.3 08/06/2014 0215   GFRNONAA 78 04/20/2019 0830   GFRNONAA >60 08/06/2014 0215   GFRAA 90 04/20/2019 0830   GFRAA >60 08/06/2014 0215   Lab Results  Component Value Date   HGBA1C 5.1 04/20/2019   HGBA1C 5.2 10/09/2018   HGBA1C 5.2 01/09/2018   Lab Results  Component Value Date   INSULIN 13.8 04/20/2019   INSULIN 13.0 10/09/2018   INSULIN 16.5 01/09/2018   CBC    Component Value Date/Time   WBC 4.7 01/09/2018 1116   WBC 7.8 09/20/2016 0621   RBC 4.75 01/09/2018 1116   RBC 3.81 09/20/2016 0621   HGB 14.5 01/09/2018 1116   HCT 42.1 01/09/2018 1116   PLT 195 09/20/2016 0621   PLT 300 08/06/2014 0215   MCV 89 01/09/2018 1116   MCV 92 08/06/2014 0215   MCH 30.5 01/09/2018 1116   MCH 31.4 09/20/2016 0621   MCHC 34.4 01/09/2018 1116   MCHC 35.2 09/20/2016 0621   RDW 13.7 01/09/2018 1116   RDW 13.3 08/06/2014 0215   LYMPHSABS 1.3 01/09/2018  1116   LYMPHSABS 0.4 (L) 08/06/2014 0215   MONOABS 0.7 08/06/2014 0215   EOSABS 0.1 01/09/2018 1116   EOSABS 0.1 08/06/2014 0215   BASOSABS 0.0 01/09/2018 1116   BASOSABS 0.2 (H) 08/06/2014 0215   Iron/TIBC/Ferritin/ %Sat No results found for: IRON, TIBC, FERRITIN, IRONPCTSAT Lipid Panel     Component Value Date/Time   CHOL 153 01/09/2018 1116   TRIG 63 01/09/2018 1116   HDL 43 01/09/2018 1116   LDLCALC 97 01/09/2018 1116   Hepatic Function Panel     Component Value Date/Time   PROT 7.1 04/20/2019 0830   PROT 8.5 (H) 08/06/2014 0215   ALBUMIN 4.5 04/20/2019 0830   ALBUMIN 4.3 08/06/2014 0215   AST 27 04/20/2019 0830   AST 33 08/06/2014 0215   ALT 36 (H) 04/20/2019 0830   ALT 58 08/06/2014 0215   ALKPHOS 64 04/20/2019 0830   ALKPHOS 115 08/06/2014 0215   BILITOT 0.3 04/20/2019 0830   BILITOT 0.7 08/06/2014 0215      Component Value Date/Time   TSH 1.470 01/09/2018 1116     Ref. Range 04/20/2019 08:30  Vitamin D, 25-Hydroxy Latest Ref Range: 30.0 - 100.0 ng/mL 34.9    OBESITY BEHAVIORAL INTERVENTION VISIT  Today's visit was # 18   Starting weight: 273 lbs Starting date: 01/09/2018 Today's weight : 261 lbs Today's date: 04/22/2019 Total lbs lost to date: 12    04/22/2019  Height 5\' 10"  (1.778 m)  Weight 261 lb (118.4 kg)  BMI (Calculated) 37.45  BLOOD PRESSURE - SYSTOLIC [id number]  BLOOD PRESSURE - DIASTOLIC 75   Body Fat % 123456 %  Total Body Water (lbs) 107 lbs    ASK: We discussed the diagnosis of obesity with Rhonda [name] today and Rhonda [name] agreed to give [name] permission to discuss obesity behavioral modification therapy today.  ASSESS: Glorianna has the diagnosis of obesity and her BMI today is 37.45 Rhonda [name] is in the action stage of change   ADVISE: Rhonda [name] was educated on the multiple health risks of obesity as well as the benefit of weight loss to improve her health. She was advised of the need for long term treatment  and the importance of lifestyle  modifications to improve her current health and to decrease her risk of future health problems.  AGREE: Multiple dietary modification options and treatment options were discussed and  Rhonda [name] agreed to follow the recommendations documented in the above note.  ARRANGE: Rhonda [name] was educated on the importance of frequent visits to treat obesity as outlined per CMS and USPSTF guidelines and agreed to schedule her next follow up appointment today.  Rhonda [name], am acting as transcriptionist for Rhonda Potash, PA-C I, Rhonda Potash, PA-C have reviewed above note and agree with its content

## 2019-05-04 ENCOUNTER — Other Ambulatory Visit: Payer: Self-pay

## 2019-05-04 ENCOUNTER — Ambulatory Visit (INDEPENDENT_AMBULATORY_CARE_PROVIDER_SITE_OTHER): Payer: No Typology Code available for payment source | Admitting: Psychiatry

## 2019-05-04 ENCOUNTER — Encounter: Payer: Self-pay | Admitting: Psychiatry

## 2019-05-04 DIAGNOSIS — F5105 Insomnia due to other mental disorder: Secondary | ICD-10-CM

## 2019-05-04 DIAGNOSIS — F330 Major depressive disorder, recurrent, mild: Secondary | ICD-10-CM | POA: Insufficient documentation

## 2019-05-04 DIAGNOSIS — F411 Generalized anxiety disorder: Secondary | ICD-10-CM | POA: Diagnosis not present

## 2019-05-04 MED ORDER — BUPROPION HCL ER (XL) 300 MG PO TB24: 300.0000 mg | ORAL_TABLET | Freq: Every day | ORAL | 0 refills | Status: DC

## 2019-05-04 MED ORDER — BUSPIRONE HCL 10 MG PO TABS: 20.0000 mg | ORAL_TABLET | Freq: Every day | ORAL | 1 refills | Status: DC

## 2019-05-04 NOTE — Progress Notes (Signed)
Virtual Visit via Video Note  I connected with Rhonda [name] on 05/04/19 at  3:30 PM EDT by a video enabled telemedicine application and verified that I am speaking with the correct person using two identifiers.   I discussed the limitations of evaluation and management by telemedicine and the availability of in person appointments. The patient expressed understanding and agreed to proceed.  I discussed the assessment and treatment plan with the patient. The patient was provided an opportunity to ask questions and all were answered. The patient agreed with the plan and demonstrated an understanding of the instructions.   The patient was advised to call back or seek an in-person evaluation if the symptoms worsen or if the condition fails to improve as anticipated.  [place] MD OP Progress Note  05/04/2019 4:56 PM Rhonda STUEDEMANN  MRN:  [id number]  Chief Complaint:  Chief Complaint    Follow-up     HPI: Rhonda [name] is a 33 year old Caucasian female, married, employed, Ship broker, has a history of depression, anxiety was evaluated by telemedicine today.  Patient today reports she is currently struggling with some mood symptoms.  She reports she may be depressed.  She is struggling with sleep issues.  She does not know if the sleep problems are due to depression or due to her medications.  She is currently taking BuSpar at bedtime and ever since that she has noticed that she is unable to sleep at night.  She has been very restless at night.  Patient reports work is going well.  Patient denies any suicidality, homicidality or perceptual disturbances.  Discussed changing the BuSpar to a.m.  Also discussed starting melatonin at bedtime for sleep.  She will make these changes and let writer know if she needs additional help.   Visit Diagnosis:    ICD-10-CM   1. MDD (major depressive disorder), recurrent episode, mild (HCC)  F33.0 busPIRone (BUSPAR) 10 MG tablet  2. GAD (generalized anxiety  disorder)  F41.1 busPIRone (BUSPAR) 10 MG tablet  3. Insomnia due to mental condition  F51.05 buPROPion (WELLBUTRIN XL) 300 MG 24 hr tablet    Past Psychiatric History: I have reviewed past psychiatric history from my progress note on 11/05/2018.  Past trials of bupropion  Past Medical History:  Past Medical History:  Diagnosis Date  . Depression    treated  . PCOS (polycystic ovarian syndrome) 2008  . Plantar fasciitis     Past Surgical History:  Procedure Laterality Date  . CHOLECYSTECTOMY      Family Psychiatric History: I have reviewed family psychiatric history from my progress note on 11/05/2018  Family History:  Family History  Problem Relation Age of Onset  . High blood pressure Mother   . Depression Father   . Liver disease Father   . Alcoholism Father   . Alcohol abuse Father   . Diabetes Paternal Grandmother   . Bipolar disorder Maternal Aunt     Social History: I have reviewed social history from my progress note on 11/05/2018 Social History   Socioeconomic History  . Marital status: Married    Spouse name: [name]  . Number of children: 2  . Years of education: Not on file  . Highest education level: Bachelor's degree (e.g., BA, AB, BS)  Occupational History  . Occupation: Therapist, sports  Social Needs  . Financial resource strain: Not hard at all  . Food insecurity    Worry: Never true    Inability: Never true  . Transportation needs  Medical: No    Non-medical: No  Tobacco Use  . Smoking status: Never Smoker  . Smokeless tobacco: Never Used  Substance and Sexual Activity  . Alcohol use: No  . Drug use: No  . Sexual activity: Yes    Birth control/protection: None  Lifestyle  . Physical activity    Days per week: 0 days    Minutes per session: 0 min  . Stress: Rather much  Relationships  . Social Herbalist on phone: Not on file    Gets together: Not on file    Attends religious service: More than 4 times per year    Active member of  club or organization: No    Attends meetings of clubs or organizations: Never    Relationship status: Married  Other Topics Concern  . Not on file  Social History Narrative  . Not on file    Allergies: No Known Allergies  Metabolic Disorder Labs: Lab Results  Component Value Date   HGBA1C 5.1 04/20/2019   No results found for: PROLACTIN Lab Results  Component Value Date   CHOL 153 01/09/2018   TRIG 63 01/09/2018   HDL 43 01/09/2018   LDLCALC 97 01/09/2018   Lab Results  Component Value Date   TSH 1.470 01/09/2018    Therapeutic Level Labs: No results found for: LITHIUM No results found for: VALPROATE No components found for:  CBMZ  Current Medications: Current Outpatient Medications  Medication Sig Dispense Refill  . meloxicam (MOBIC) 15 MG tablet Take by mouth.    Marland Kitchen buPROPion (WELLBUTRIN XL) 300 MG 24 hr tablet Take 1 tablet (300 mg total) by mouth daily. 90 tablet 0  . busPIRone (BUSPAR) 10 MG tablet Take 2 tablets (20 mg total) by mouth daily with breakfast. 60 tablet 1  . ELDERBERRY PO Take by mouth.    . levonorgestrel (MIRENA) 20 MCG/24HR IUD 1 each by Intrauterine route once.    . metFORMIN (GLUCOPHAGE) 500 MG tablet Take 1 tablet (500 mg total) by mouth daily with breakfast. 30 tablet 0  . Multiple Vitamin (MULTIVITAMIN) tablet Take 1 tablet by mouth daily.    Marland Kitchen nystatin cream (MYCOSTATIN) Apply 1 application topically 2 (two) times daily. 30 g 0  . spironolactone (ALDACTONE) 100 MG tablet Take by mouth.    . Vitamin D, Ergocalciferol, (DRISDOL) 1.25 MG (50000 UT) CAPS capsule Take 1 capsule (50,000 Units total) by mouth every 7 (seven) days. 4 capsule 0   No current facility-administered medications for this visit.      Musculoskeletal: Strength & Muscle Tone: UTA Gait & Station: normal Patient leans: N/A  Psychiatric Specialty Exam: Review of Systems  Psychiatric/Behavioral: Positive for depression. The patient is nervous/anxious and has insomnia.    All other systems reviewed and are negative.   There were no vitals taken for this visit.There is no height or weight on file to calculate BMI.  General Appearance: Casual  Eye Contact:  Fair  Speech:  Clear and Coherent  Volume:  Normal  Mood:  Anxious and Depressed  Affect:  Congruent  Thought Process:  Goal Directed and Descriptions of Associations: Intact  Orientation:  Full (Time, Place, and Person)  Thought Content: Logical   Suicidal Thoughts:  No  Homicidal Thoughts:  No  Memory:  Immediate;   Fair Recent;   Fair Remote;   Fair  Judgement:  Fair  Insight:  Fair  Psychomotor Activity:  Normal  Concentration:  Concentration: Fair and Attention Span:  Fair  Recall:  Smiley Houseman of Knowledge: Fair  Language: Fair  Akathisia:  No  Handed:  Right  AIMS (if indicated): Denies tremors, rigidity  Assets:  Communication Skills Desire for Improvement Social Support  ADL's:  Intact  Cognition: WNL  Sleep:  Poor   Screenings: PHQ2-9     Office Visit from 01/09/2018 in [facility]  PHQ-2 Total Score  3  PHQ-9 Total Score  13       Assessment and Plan: Solanch is a 33 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  Patient is biologically predisposed given her history of mental health problems in her family.  She also has psychosocial stressors of being in school, job related stressors, having any children who needs help.  Patient continues to struggle with some depression and sleep problems.  Plan as noted below.  Plan MDD-some progress Wellbutrin XL 300 mg p.o. daily Change BuSpar to 20 mg in the morning Patient was referred for CBT Continue l-methylfolate 7.5 mg for folic acid conversion reaction  GAD-some progress Patient referred for CBT  For insomnia-unstable Change BuSpar dosage time Start melatonin 3 to 9 mg p.o. nightly    GeneSight testing results were reviewed while  making medication changes.  Follow-up in clinic in 3 to 4 weeks or sooner if needed.  October 12 at 4:30 PM I have spent atleast 15 minutes non face to face with patient today. More than 50 % of the time was spent for psychoeducation and supportive psychotherapy and care coordination. This note was generated in part or whole with voice recognition software. Voice recognition is usually quite accurate but there are transcription errors that can and very often do occur. I apologize for any typographical errors that were not detected and corrected.       Ursula Alert, MD 05/04/2019, 4:56 PM

## 2019-05-06 ENCOUNTER — Other Ambulatory Visit: Payer: Self-pay

## 2019-05-06 ENCOUNTER — Ambulatory Visit (INDEPENDENT_AMBULATORY_CARE_PROVIDER_SITE_OTHER): Payer: No Typology Code available for payment source | Admitting: Physician Assistant

## 2019-05-06 VITALS — BP 106/68 | HR 72 | Temp 97.4°F | Ht 70.0 in | Wt 258.2 lb

## 2019-05-06 DIAGNOSIS — Z6837 Body mass index (BMI) 37.0-37.9, adult: Secondary | ICD-10-CM | POA: Diagnosis not present

## 2019-05-06 DIAGNOSIS — E559 Vitamin D deficiency, unspecified: Secondary | ICD-10-CM

## 2019-05-10 ENCOUNTER — Encounter (INDEPENDENT_AMBULATORY_CARE_PROVIDER_SITE_OTHER): Payer: Self-pay | Admitting: Physician Assistant

## 2019-05-10 DIAGNOSIS — E8881 Metabolic syndrome: Secondary | ICD-10-CM

## 2019-05-10 DIAGNOSIS — E559 Vitamin D deficiency, unspecified: Secondary | ICD-10-CM

## 2019-05-11 MED ORDER — VITAMIN D (ERGOCALCIFEROL) 1.25 MG (50000 UNIT) PO CAPS: 50000.0000 [IU] | ORAL_CAPSULE | ORAL | 0 refills | Status: DC

## 2019-05-11 MED ORDER — METFORMIN HCL 500 MG PO TABS: 500.0000 mg | ORAL_TABLET | Freq: Every day | ORAL | 0 refills | Status: DC

## 2019-05-11 NOTE — Progress Notes (Signed)
Office: [phone]  /  Fax: [phone]   HPI:   Chief Complaint: OBESITY Rhonda [name] is here to discuss her progress with her obesity treatment plan. She is on the Category 2 plan and is following her eating plan approximately 60 % of the time. She states she is exercising 0 minutes 0 times per week. Rhonda [name] reports that she did a better job over the last two weeks with staying on the plan during the weekend. She is traveling to [place] this weekend. Her weight is 258 lb 3.2 oz (117.1 kg) today and has had a weight loss of 3 pounds over a period of 2 weeks since her last visit. She has lost 15 lbs since starting treatment with [name].  Vitamin D deficiency Rhonda [name] has a diagnosis of vitamin D deficiency. Rhonda [name] is currently taking vit D and she denies nausea, vomiting or muscle weakness.  ASSESSMENT AND PLAN:  Vitamin D deficiency  Class 2 severe obesity with serious comorbidity and body mass index (BMI) of 37.0 to 37.9 in adult, unspecified obesity type ([place])  PLAN:  Vitamin D Deficiency Aven was informed that low vitamin D levels contributes to fatigue and are associated with obesity, breast, and colon cancer. Rhonda [name] will continue to take prescription Vit D @50 ,000 IU every week and she will follow up for routine testing of vitamin D, at least 2-3 times per year. She was informed of the risk of over-replacement of vitamin D and agrees to not increase her dose unless she discusses this with [name] first.  I spent > than 50% of the 15 minute visit on counseling as documented in the note.  Obesity Rhonda [name] is currently in the action stage of change. As such, her goal is to continue with weight loss efforts She has agreed to follow the Category 2 plan Rhonda [name] has been instructed to work up to a goal of 150 minutes of combined cardio and strengthening exercise per week for weight loss and overall health benefits. We discussed the following Behavioral Modification Strategies today:  keeping healthy foods in the home, work on meal planning and easy cooking plans and travel eating strategies   [name] has agreed to follow up with our clinic in 2 weeks. She was informed of the importance of frequent follow up visits to maximize her success with intensive lifestyle modifications for her multiple health conditions.  ALLERGIES: No Known Allergies  MEDICATIONS: Current Outpatient Medications on File Prior to Visit  Medication Sig Dispense Refill   buPROPion (WELLBUTRIN XL) 300 MG 24 hr tablet Take 1 tablet (300 mg total) by mouth daily. 90 tablet 0   busPIRone (BUSPAR) 10 MG tablet Take 2 tablets (20 mg total) by mouth daily with breakfast. 60 tablet 1   ELDERBERRY PO Take by mouth.     levonorgestrel (MIRENA) 20 MCG/24HR IUD 1 each by Intrauterine route once.     Melatonin 5 MG CHEW Chew 5 mg by mouth at bedtime.     meloxicam (MOBIC) 15 MG tablet Take by mouth.     Multiple Vitamin (MULTIVITAMIN) tablet Take 1 tablet by mouth daily.     nystatin cream (MYCOSTATIN) Apply 1 application topically 2 (two) times daily. 30 g 0   spironolactone (ALDACTONE) 100 MG tablet Take by mouth.     No current facility-administered medications on file prior to visit.     PAST MEDICAL HISTORY: Past Medical History:  Diagnosis Date   Depression    treated   PCOS (polycystic ovarian syndrome) 2008  Plantar fasciitis     PAST SURGICAL HISTORY: Past Surgical History:  Procedure Laterality Date   CHOLECYSTECTOMY      SOCIAL HISTORY: Social History   Tobacco Use   Smoking status: Never Smoker   Smokeless tobacco: Never Used  Substance Use Topics   Alcohol use: No   Drug use: No    FAMILY HISTORY: Family History  Problem Relation Age of Onset   High blood pressure Mother    Depression Father    Liver disease Father    Alcoholism Father    Alcohol abuse Father    Diabetes Paternal Grandmother    Bipolar disorder Maternal Aunt      ROS: Review of Systems  Constitutional: Positive for weight loss.  Gastrointestinal: Negative for nausea and vomiting.  Musculoskeletal:       Negative for muscle weakness    PHYSICAL EXAM: Blood pressure 106/68, pulse 72, temperature (!) 97.4 F (36.3 C), temperature source Oral, height 5\' 10"  (1.778 m), weight 258 lb 3.2 oz (117.1 kg), SpO2 97 %. Body mass index is 37.05 kg/m. Physical Exam Vitals signs reviewed.  Constitutional:      Appearance: Normal appearance. She is well-developed. She is obese.  Cardiovascular:     Rate and Rhythm: Normal rate.  Pulmonary:     Effort: Pulmonary effort is normal.  Musculoskeletal: Normal range of motion.  Skin:    General: Skin is warm and dry.  Neurological:     Mental Status: She is alert and oriented to person, place, and time.  Psychiatric:        Mood and Affect: Mood normal.        Behavior: Behavior normal.     RECENT LABS AND TESTS: BMET    Component Value Date/Time   NA 139 04/20/2019 0830   NA 142 08/06/2014 0215   K 4.4 04/20/2019 0830   K 3.5 08/06/2014 0215   CL 104 04/20/2019 0830   CL 108 (H) 08/06/2014 0215   CO2 22 04/20/2019 0830   CO2 20 (L) 08/06/2014 0215   GLUCOSE 82 04/20/2019 0830   GLUCOSE 74 09/18/2016 1804   GLUCOSE 113 (H) 08/06/2014 0215   BUN 12 04/20/2019 0830   BUN 15 08/06/2014 0215   CREATININE 0.96 04/20/2019 0830   CREATININE 1.00 08/06/2014 0215   CALCIUM 9.7 04/20/2019 0830   CALCIUM 9.3 08/06/2014 0215   GFRNONAA 78 04/20/2019 0830   GFRNONAA >60 08/06/2014 0215   GFRAA 90 04/20/2019 0830   GFRAA >60 08/06/2014 0215   Lab Results  Component Value Date   HGBA1C 5.1 04/20/2019   HGBA1C 5.2 10/09/2018   HGBA1C 5.2 01/09/2018   Lab Results  Component Value Date   INSULIN 13.8 04/20/2019   INSULIN 13.0 10/09/2018   INSULIN 16.5 01/09/2018   CBC    Component Value Date/Time   WBC 4.7 01/09/2018 1116   WBC 7.8 09/20/2016 0621   RBC 4.75 01/09/2018 1116   RBC 3.81  09/20/2016 0621   HGB 14.5 01/09/2018 1116   HCT 42.1 01/09/2018 1116   PLT 195 09/20/2016 0621   PLT 300 08/06/2014 0215   MCV 89 01/09/2018 1116   MCV 92 08/06/2014 0215   MCH 30.5 01/09/2018 1116   MCH 31.4 09/20/2016 0621   MCHC 34.4 01/09/2018 1116   MCHC 35.2 09/20/2016 0621   RDW 13.7 01/09/2018 1116   RDW 13.3 08/06/2014 0215   LYMPHSABS 1.3 01/09/2018 1116   LYMPHSABS 0.4 (L) 08/06/2014 0215   MONOABS  0.7 08/06/2014 0215   EOSABS 0.1 01/09/2018 1116   EOSABS 0.1 08/06/2014 0215   BASOSABS 0.0 01/09/2018 1116   BASOSABS 0.2 (H) 08/06/2014 0215   Iron/TIBC/Ferritin/ %Sat No results found for: IRON, TIBC, FERRITIN, IRONPCTSAT Lipid Panel     Component Value Date/Time   CHOL 153 01/09/2018 1116   TRIG 63 01/09/2018 1116   HDL 43 01/09/2018 1116   LDLCALC 97 01/09/2018 1116   Hepatic Function Panel     Component Value Date/Time   PROT 7.1 04/20/2019 0830   PROT 8.5 (H) 08/06/2014 0215   ALBUMIN 4.5 04/20/2019 0830   ALBUMIN 4.3 08/06/2014 0215   AST 27 04/20/2019 0830   AST 33 08/06/2014 0215   ALT 36 (H) 04/20/2019 0830   ALT 58 08/06/2014 0215   ALKPHOS 64 04/20/2019 0830   ALKPHOS 115 08/06/2014 0215   BILITOT 0.3 04/20/2019 0830   BILITOT 0.7 08/06/2014 0215      Component Value Date/Time   TSH 1.470 01/09/2018 1116     Ref. Range 04/20/2019 08:30  Vitamin D, 25-Hydroxy Latest Ref Range: 30.0 - 100.0 ng/mL 34.9    OBESITY BEHAVIORAL INTERVENTION VISIT  Today's visit was # 19   Starting weight: 273 lbs Starting date: 01/09/2018 Today's weight : 258 lbs Today's date: 05/06/2019 Total lbs lost to date: 15    05/06/2019  Height 5\' 10"  (1.778 m)  Weight 258 lb 3.2 oz (117.1 kg)  BMI (Calculated) 37.05  BLOOD PRESSURE - SYSTOLIC [id number]  BLOOD PRESSURE - DIASTOLIC 68   Body Fat % A999333 %  Total Body Water (lbs) 100.8 lbs    ASK: We discussed the diagnosis of obesity with Karma Greaser today and Deara agreed to give [name] permission to discuss  obesity behavioral modification therapy today.  ASSESS: Arletta has the diagnosis of obesity and her BMI today is 37.05 Dinorah is in the action stage of change   ADVISE: Braedyn was educated on the multiple health risks of obesity as well as the benefit of weight loss to improve her health. She was advised of the need for long term treatment and the importance of lifestyle modifications to improve her current health and to decrease her risk of future health problems.  AGREE: Multiple dietary modification options and treatment options were discussed and  Carter agreed to follow the recommendations documented in the above note.  ARRANGE: Jerri was educated on the importance of frequent visits to treat obesity as outlined per CMS and USPSTF guidelines and agreed to schedule her next follow up appointment today.  Corey Skains, am acting as transcriptionist for Abby Potash, PA-C I, Abby Potash, PA-C have reviewed above note and agree with its content

## 2019-05-19 ENCOUNTER — Encounter (INDEPENDENT_AMBULATORY_CARE_PROVIDER_SITE_OTHER): Payer: Self-pay

## 2019-05-20 ENCOUNTER — Other Ambulatory Visit: Payer: Self-pay

## 2019-05-20 ENCOUNTER — Encounter (INDEPENDENT_AMBULATORY_CARE_PROVIDER_SITE_OTHER): Payer: Self-pay | Admitting: Physician Assistant

## 2019-05-20 ENCOUNTER — Ambulatory Visit (INDEPENDENT_AMBULATORY_CARE_PROVIDER_SITE_OTHER): Payer: No Typology Code available for payment source | Admitting: Physician Assistant

## 2019-05-20 VITALS — BP 120/80 | HR 81 | Temp 97.8°F | Ht 70.0 in | Wt 257.0 lb

## 2019-05-20 DIAGNOSIS — E559 Vitamin D deficiency, unspecified: Secondary | ICD-10-CM | POA: Diagnosis not present

## 2019-05-20 DIAGNOSIS — Z6836 Body mass index (BMI) 36.0-36.9, adult: Secondary | ICD-10-CM

## 2019-05-20 NOTE — Progress Notes (Signed)
Office: [phone]  /  Fax: [phone]   HPI:   Chief Complaint: OBESITY Rhonda [name] is here to discuss her progress with her obesity treatment plan. She is on the Category 2 plan and is following her eating plan approximately 75% of the time. She states she is exercising 0 minutes 0 times per week. Rhonda [name] reports that she had a girl's trip and got off track, but has been back on plan since returning. Her weight is 257 lb (116.6 kg) today and has had a weight loss of 1 pound over a period of 2 weeks since her last visit. She has lost 16 lbs since starting treatment with [name].  Vitamin D deficiency Rhonda [name] has a diagnosis of Vitamin D deficiency. She is currently taking Vit D and denies nausea, vomiting or muscle weakness.  ASSESSMENT AND PLAN:  Vitamin D deficiency  Class 2 severe obesity with serious comorbidity and body mass index (BMI) of 36.0 to 36.9 in adult, unspecified obesity type ([place])  PLAN:  Vitamin D Deficiency Rhonda [name] was informed that low Vitamin D levels contributes to fatigue and are associated with obesity, breast, and colon cancer. She agrees to continue taking Vit D and will follow-up for routine testing of Vitamin D, at least 2-3 times per year. She was informed of the risk of over-replacement of Vitamin D and agrees to not increase her dose unless she discusses this with [name] first. Rhonda [name] agrees to follow-up with our clinic in 2-3 weeks.  I spent > than 50% of the 15 minute visit on counseling as documented in the note.  Obesity Rhonda [name] is currently in the action stage of change. As such, her goal is to continue with weight loss efforts. She has agreed to follow the Category 2 plan. Rhonda [name] has been instructed to work up to a goal of 150 minutes of combined cardio and strengthening exercise per week for weight loss and overall health benefits. We discussed the following Behavioral Modification Strategies today: work on meal planning and easy cooking plans, and  keeping healthy foods in the home.  Rhonda [name] has agreed to follow-up with our clinic in 2-3 weeks. She was informed of the importance of frequent follow-up visits to maximize her success with intensive lifestyle modifications for her multiple health conditions.  ALLERGIES: No Known Allergies  MEDICATIONS: Current Outpatient Medications on File Prior to Visit  Medication Sig Dispense Refill   buPROPion (WELLBUTRIN XL) 300 MG 24 hr tablet Take 1 tablet (300 mg total) by mouth daily. 90 tablet 0   busPIRone (BUSPAR) 10 MG tablet Take 2 tablets (20 mg total) by mouth daily with breakfast. 60 tablet 1   ELDERBERRY PO Take by mouth.     levonorgestrel (MIRENA) 20 MCG/24HR IUD 1 each by Intrauterine route once.     Melatonin 5 MG CHEW Chew 5 mg by mouth at bedtime.     meloxicam (MOBIC) 15 MG tablet Take by mouth.     metFORMIN (GLUCOPHAGE) 500 MG tablet Take 1 tablet (500 mg total) by mouth daily with breakfast. 30 tablet 0   Multiple Vitamin (MULTIVITAMIN) tablet Take 1 tablet by mouth daily.     spironolactone (ALDACTONE) 100 MG tablet Take by mouth.     Vitamin D, Ergocalciferol, (DRISDOL) 1.25 MG (50000 UT) CAPS capsule Take 1 capsule (50,000 Units total) by mouth every 7 (seven) days. 4 capsule 0   No current facility-administered medications on file prior to visit.     PAST MEDICAL HISTORY: Past Medical History:  Diagnosis  Date   Depression    treated   PCOS (polycystic ovarian syndrome) 2008   Plantar fasciitis     PAST SURGICAL HISTORY: Past Surgical History:  Procedure Laterality Date   CHOLECYSTECTOMY      SOCIAL HISTORY: Social History   Tobacco Use   Smoking status: Never Smoker   Smokeless tobacco: Never Used  Substance Use Topics   Alcohol use: No   Drug use: No    FAMILY HISTORY: Family History  Problem Relation Age of Onset   High blood pressure Mother    Depression Father    Liver disease Father    Alcoholism Father    Alcohol  abuse Father    Diabetes Paternal Grandmother    Bipolar disorder Maternal Aunt    ROS: Review of Systems  Gastrointestinal: Negative for nausea and vomiting.  Musculoskeletal:       Negative for muscle weakness.   PHYSICAL EXAM: Blood pressure 120/80, pulse 81, temperature 97.8 F (36.6 C), temperature source Oral, height 5\' 10"  (1.778 m), weight 257 lb (116.6 kg), SpO2 97 %. Body mass index is 36.88 kg/m. Physical Exam Vitals signs reviewed.  Constitutional:      Appearance: Normal appearance. She is obese.  Cardiovascular:     Rate and Rhythm: Normal rate.     Pulses: Normal pulses.  Pulmonary:     Effort: Pulmonary effort is normal.     Breath sounds: Normal breath sounds.  Musculoskeletal: Normal range of motion.  Skin:    General: Skin is warm and dry.  Neurological:     Mental Status: She is alert and oriented to person, place, and time.  Psychiatric:        Behavior: Behavior normal.   RECENT LABS AND TESTS: BMET    Component Value Date/Time   NA 139 04/20/2019 0830   NA 142 08/06/2014 0215   K 4.4 04/20/2019 0830   K 3.5 08/06/2014 0215   CL 104 04/20/2019 0830   CL 108 (H) 08/06/2014 0215   CO2 22 04/20/2019 0830   CO2 20 (L) 08/06/2014 0215   GLUCOSE 82 04/20/2019 0830   GLUCOSE 74 09/18/2016 1804   GLUCOSE 113 (H) 08/06/2014 0215   BUN 12 04/20/2019 0830   BUN 15 08/06/2014 0215   CREATININE 0.96 04/20/2019 0830   CREATININE 1.00 08/06/2014 0215   CALCIUM 9.7 04/20/2019 0830   CALCIUM 9.3 08/06/2014 0215   GFRNONAA 78 04/20/2019 0830   GFRNONAA >60 08/06/2014 0215   GFRAA 90 04/20/2019 0830   GFRAA >60 08/06/2014 0215   Lab Results  Component Value Date   HGBA1C 5.1 04/20/2019   HGBA1C 5.2 10/09/2018   HGBA1C 5.2 01/09/2018   Lab Results  Component Value Date   INSULIN 13.8 04/20/2019   INSULIN 13.0 10/09/2018   INSULIN 16.5 01/09/2018   CBC    Component Value Date/Time   WBC 4.7 01/09/2018 1116   WBC 7.8 09/20/2016 0621   RBC  4.75 01/09/2018 1116   RBC 3.81 09/20/2016 0621   HGB 14.5 01/09/2018 1116   HCT 42.1 01/09/2018 1116   PLT 195 09/20/2016 0621   PLT 300 08/06/2014 0215   MCV 89 01/09/2018 1116   MCV 92 08/06/2014 0215   MCH 30.5 01/09/2018 1116   MCH 31.4 09/20/2016 0621   MCHC 34.4 01/09/2018 1116   MCHC 35.2 09/20/2016 0621   RDW 13.7 01/09/2018 1116   RDW 13.3 08/06/2014 0215   LYMPHSABS 1.3 01/09/2018 1116   LYMPHSABS 0.4 (L)  08/06/2014 0215   MONOABS 0.7 08/06/2014 0215   EOSABS 0.1 01/09/2018 1116   EOSABS 0.1 08/06/2014 0215   BASOSABS 0.0 01/09/2018 1116   BASOSABS 0.2 (H) 08/06/2014 0215   Iron/TIBC/Ferritin/ %Sat No results found for: IRON, TIBC, FERRITIN, IRONPCTSAT Lipid Panel     Component Value Date/Time   CHOL 153 01/09/2018 1116   TRIG 63 01/09/2018 1116   HDL 43 01/09/2018 1116   LDLCALC 97 01/09/2018 1116   Hepatic Function Panel     Component Value Date/Time   PROT 7.1 04/20/2019 0830   PROT 8.5 (H) 08/06/2014 0215   ALBUMIN 4.5 04/20/2019 0830   ALBUMIN 4.3 08/06/2014 0215   AST 27 04/20/2019 0830   AST 33 08/06/2014 0215   ALT 36 (H) 04/20/2019 0830   ALT 58 08/06/2014 0215   ALKPHOS 64 04/20/2019 0830   ALKPHOS 115 08/06/2014 0215   BILITOT 0.3 04/20/2019 0830   BILITOT 0.7 08/06/2014 0215      Component Value Date/Time   TSH 1.470 01/09/2018 1116   Results for MCKYNLIE, DIPIETRANTONIO (MRN [id number]) as of 05/20/2019 16:40  Ref. Range 04/20/2019 08:30  Vitamin D, 25-Hydroxy Latest Ref Range: 30.0 - 100.0 ng/mL 34.9   OBESITY BEHAVIORAL INTERVENTION VISIT  Today's visit was #20   Starting weight: 273 lbs Starting date: 01/09/2018 Today's weight: 257 lbs  Today's date: 05/20/2019 Total lbs lost to date: 16    05/20/2019  Height 5\' 10"  (1.778 m)  Weight 257 lb (116.6 kg)  BMI (Calculated) 36.88  BLOOD PRESSURE - SYSTOLIC [id number]  BLOOD PRESSURE - DIASTOLIC 80   Body Fat % 123XX123 %  Total Body Water (lbs) 99.4 lbs   ASK: We discussed the diagnosis of  obesity with Karma Greaser today and Kashena agreed to give [name] permission to discuss obesity behavioral modification therapy today.  ASSESS: Raygen has the diagnosis of obesity and her BMI today is 36.9. Almer is in the action stage of change.   ADVISE: Aviv was educated on the multiple health risks of obesity as well as the benefit of weight loss to improve her health. She was advised of the need for long term treatment and the importance of lifestyle modifications to improve her current health and to decrease her risk of future health problems.  AGREE: Multiple dietary modification options and treatment options were discussed and  Dayanna agreed to follow the recommendations documented in the above note.  ARRANGE: Soojin was educated on the importance of frequent visits to treat obesity as outlined per CMS and USPSTF guidelines and agreed to schedule her next follow up appointment today.  Migdalia Dk, am acting as transcriptionist for Abby Potash, PA-C I, Abby Potash, PA-C have reviewed above note and agree with its content

## 2019-06-01 ENCOUNTER — Ambulatory Visit: Payer: No Typology Code available for payment source | Admitting: Psychiatry

## 2019-06-04 ENCOUNTER — Other Ambulatory Visit: Payer: Self-pay

## 2019-06-04 ENCOUNTER — Ambulatory Visit (INDEPENDENT_AMBULATORY_CARE_PROVIDER_SITE_OTHER): Payer: No Typology Code available for payment source | Admitting: Physician Assistant

## 2019-06-04 VITALS — BP 126/79 | HR 78 | Temp 98.1°F | Ht 70.0 in | Wt 254.0 lb

## 2019-06-04 DIAGNOSIS — E559 Vitamin D deficiency, unspecified: Secondary | ICD-10-CM | POA: Diagnosis not present

## 2019-06-04 DIAGNOSIS — E88819 Insulin resistance, unspecified: Secondary | ICD-10-CM

## 2019-06-04 DIAGNOSIS — E8881 Metabolic syndrome: Secondary | ICD-10-CM

## 2019-06-04 DIAGNOSIS — Z9189 Other specified personal risk factors, not elsewhere classified: Secondary | ICD-10-CM

## 2019-06-04 DIAGNOSIS — Z6836 Body mass index (BMI) 36.0-36.9, adult: Secondary | ICD-10-CM

## 2019-06-04 MED ORDER — VITAMIN D (ERGOCALCIFEROL) 1.25 MG (50000 UNIT) PO CAPS: 50000.0000 [IU] | ORAL_CAPSULE | ORAL | 0 refills | Status: DC

## 2019-06-04 MED ORDER — METFORMIN HCL 500 MG PO TABS: 500.0000 mg | ORAL_TABLET | Freq: Every day | ORAL | 0 refills | Status: DC

## 2019-06-09 NOTE — Progress Notes (Signed)
Office: [phone]  /  Fax: [phone]   HPI:   Chief Complaint: OBESITY Rhonda [name] is here to discuss her progress with her obesity treatment plan. She is on the Category 2 plan and is following her eating plan approximately 50 % of the time. She states she is exercising 0 minutes 0 times per week. Rhonda [name] reports that she was on vacation at the beach for a few days, but she has done well with the plan overall.  Her weight is 254 lb (115.2 kg) today and has had a weight loss of 3 pounds over a period of 2 weeks since her last visit. She has lost 19 lbs since starting treatment with [name].  Vitamin D deficiency Ala has a diagnosis of vitamin D deficiency. Jamora is currently taking vit D and she denies nausea, vomiting or muscle weakness.  At risk for osteopenia and osteoporosis Zahniyah is at higher risk of osteopenia and osteoporosis due to vitamin D deficiency.   Insulin Resistance Tanganyika has a diagnosis of insulin resistance based on her elevated fasting insulin level >5. Although Mattingly's blood glucose readings are still under good control, insulin resistance puts her at greater risk of metabolic syndrome and diabetes. Telia is taking metformin and she denies nausea, vomiting or diarrhea. She continues to work on diet and exercise to decrease risk of diabetes.  ASSESSMENT AND PLAN:  Vitamin D deficiency - Plan: Vitamin D, Ergocalciferol, (DRISDOL) 1.25 MG (50000 UT) CAPS capsule  Insulin resistance - Plan: metFORMIN (GLUCOPHAGE) 500 MG tablet  At risk for osteoporosis  Class 2 severe obesity with serious comorbidity and body mass index (BMI) of 36.0 to 36.9 in adult, unspecified obesity type ([place])  PLAN:  Vitamin D Deficiency Cedrina was informed that low vitamin D levels contributes to fatigue and are associated with obesity, breast, and colon cancer. Rhonda [name] agrees to continue to take prescription Vit D @50 ,000 IU every week #4 with no refills and she will follow up  for routine testing of vitamin D, at least 2-3 times per year. She was informed of the risk of over-replacement of vitamin D and agrees to not increase her dose unless she discusses this with [name] first. [name] agrees to follow up with our clinic in 2 to 3 weeks.  At risk for osteopenia and osteoporosis Rhonda [name] was given extended  (15 minutes) osteoporosis prevention counseling today. Rhonda [name] is at risk for osteopenia and osteoporosis due to her vitamin D deficiency. She was encouraged to take her vitamin D and follow her higher calcium diet and increase strengthening exercise to help strengthen her bones and decrease her risk of osteopenia and osteoporosis.  Insulin Resistance Rhonda [name] will continue to work on weight loss, exercise, and decreasing simple carbohydrates in her diet to help decrease the risk of diabetes. We dicussed metformin including benefits and risks. She was informed that eating too many simple carbohydrates or too many calories at one sitting increases the likelihood of GI side effects. [name] agrees to continue metformin 500 mg daily with breakfast #30 with no refills and follow up with [name] as directed to monitor her progress.  Obesity Rhonda [name] is currently in the action stage of change. As such, her goal is to continue with weight loss efforts She has agreed to follow the Category 2 plan [name] has been instructed to work up to a goal of 150 minutes of combined cardio and strengthening exercise per week for weight loss and overall health benefits. We discussed the following Behavioral Modification Strategies today: keeping  healthy foods in the home and work on meal planning and easy cooking plans  Rhonda [name] has agreed to follow up with our clinic in 2 to 3 weeks. She was informed of the importance of frequent follow up visits to maximize her success with intensive lifestyle modifications for her multiple health conditions.  ALLERGIES: No Known Allergies  MEDICATIONS: Current  Outpatient Medications on File Prior to Visit  Medication Sig Dispense Refill   buPROPion (WELLBUTRIN XL) 300 MG 24 hr tablet Take 1 tablet (300 mg total) by mouth daily. 90 tablet 0   busPIRone (BUSPAR) 10 MG tablet Take 2 tablets (20 mg total) by mouth daily with breakfast. 60 tablet 1   ELDERBERRY PO Take by mouth.     levonorgestrel (MIRENA) 20 MCG/24HR IUD 1 each by Intrauterine route once.     Melatonin 5 MG CHEW Chew 5 mg by mouth at bedtime.     meloxicam (MOBIC) 15 MG tablet Take by mouth.     Multiple Vitamin (MULTIVITAMIN) tablet Take 1 tablet by mouth daily.     spironolactone (ALDACTONE) 100 MG tablet Take by mouth.     No current facility-administered medications on file prior to visit.     PAST MEDICAL HISTORY: Past Medical History:  Diagnosis Date   Depression    treated   PCOS (polycystic ovarian syndrome) 2008   Plantar fasciitis     PAST SURGICAL HISTORY: Past Surgical History:  Procedure Laterality Date   CHOLECYSTECTOMY      SOCIAL HISTORY: Social History   Tobacco Use   Smoking status: Never Smoker   Smokeless tobacco: Never Used  Substance Use Topics   Alcohol use: No   Drug use: No    FAMILY HISTORY: Family History  Problem Relation Age of Onset   High blood pressure Mother    Depression Father    Liver disease Father    Alcoholism Father    Alcohol abuse Father    Diabetes Paternal Grandmother    Bipolar disorder Maternal Aunt     ROS: Review of Systems  Constitutional: Positive for weight loss.  Gastrointestinal: Negative for diarrhea, nausea and vomiting.  Musculoskeletal:       Negative for muscle weakness    PHYSICAL EXAM: Blood pressure 126/79, pulse 78, temperature 98.1 F (36.7 C), temperature source Oral, height 5\' 10"  (1.778 m), weight 254 lb (115.2 kg), SpO2 98 %. Body mass index is 36.45 kg/m. Physical Exam Vitals signs reviewed.  Constitutional:      Appearance: Normal appearance. She is  well-developed. She is obese.  Cardiovascular:     Rate and Rhythm: Normal rate.  Pulmonary:     Effort: Pulmonary effort is normal.  Musculoskeletal: Normal range of motion.  Skin:    General: Skin is warm and dry.  Neurological:     Mental Status: She is alert and oriented to person, place, and time.  Psychiatric:        Mood and Affect: Mood normal.        Behavior: Behavior normal.     RECENT LABS AND TESTS: BMET    Component Value Date/Time   NA 139 04/20/2019 0830   NA 142 08/06/2014 0215   K 4.4 04/20/2019 0830   K 3.5 08/06/2014 0215   CL 104 04/20/2019 0830   CL 108 (H) 08/06/2014 0215   CO2 22 04/20/2019 0830   CO2 20 (L) 08/06/2014 0215   GLUCOSE 82 04/20/2019 0830   GLUCOSE 74 09/18/2016 1804   GLUCOSE  113 (H) 08/06/2014 0215   BUN 12 04/20/2019 0830   BUN 15 08/06/2014 0215   CREATININE 0.96 04/20/2019 0830   CREATININE 1.00 08/06/2014 0215   CALCIUM 9.7 04/20/2019 0830   CALCIUM 9.3 08/06/2014 0215   GFRNONAA 78 04/20/2019 0830   GFRNONAA >60 08/06/2014 0215   GFRAA 90 04/20/2019 0830   GFRAA >60 08/06/2014 0215   Lab Results  Component Value Date   HGBA1C 5.1 04/20/2019   HGBA1C 5.2 10/09/2018   HGBA1C 5.2 01/09/2018   Lab Results  Component Value Date   INSULIN 13.8 04/20/2019   INSULIN 13.0 10/09/2018   INSULIN 16.5 01/09/2018   CBC    Component Value Date/Time   WBC 4.7 01/09/2018 1116   WBC 7.8 09/20/2016 0621   RBC 4.75 01/09/2018 1116   RBC 3.81 09/20/2016 0621   HGB 14.5 01/09/2018 1116   HCT 42.1 01/09/2018 1116   PLT 195 09/20/2016 0621   PLT 300 08/06/2014 0215   MCV 89 01/09/2018 1116   MCV 92 08/06/2014 0215   MCH 30.5 01/09/2018 1116   MCH 31.4 09/20/2016 0621   MCHC 34.4 01/09/2018 1116   MCHC 35.2 09/20/2016 0621   RDW 13.7 01/09/2018 1116   RDW 13.3 08/06/2014 0215   LYMPHSABS 1.3 01/09/2018 1116   LYMPHSABS 0.4 (L) 08/06/2014 0215   MONOABS 0.7 08/06/2014 0215   EOSABS 0.1 01/09/2018 1116   EOSABS 0.1  08/06/2014 0215   BASOSABS 0.0 01/09/2018 1116   BASOSABS 0.2 (H) 08/06/2014 0215   Iron/TIBC/Ferritin/ %Sat No results found for: IRON, TIBC, FERRITIN, IRONPCTSAT Lipid Panel     Component Value Date/Time   CHOL 153 01/09/2018 1116   TRIG 63 01/09/2018 1116   HDL 43 01/09/2018 1116   LDLCALC 97 01/09/2018 1116   Hepatic Function Panel     Component Value Date/Time   PROT 7.1 04/20/2019 0830   PROT 8.5 (H) 08/06/2014 0215   ALBUMIN 4.5 04/20/2019 0830   ALBUMIN 4.3 08/06/2014 0215   AST 27 04/20/2019 0830   AST 33 08/06/2014 0215   ALT 36 (H) 04/20/2019 0830   ALT 58 08/06/2014 0215   ALKPHOS 64 04/20/2019 0830   ALKPHOS 115 08/06/2014 0215   BILITOT 0.3 04/20/2019 0830   BILITOT 0.7 08/06/2014 0215      Component Value Date/Time   TSH 1.470 01/09/2018 1116     Ref. Range 04/20/2019 08:30  Vitamin D, 25-Hydroxy Latest Ref Range: 30.0 - 100.0 ng/mL 34.9    OBESITY BEHAVIORAL INTERVENTION VISIT  Today's visit was # 21   Starting weight: 273 lbs Starting date: 01/09/2018 Today's weight : 254 lbs Today's date: 06/04/2019 Total lbs lost to date: 19    06/04/2019  Height 5\' 10"  (1.778 m)  Weight 254 lb (115.2 kg)  BMI (Calculated) 36.45  BLOOD PRESSURE - SYSTOLIC [id number]  BLOOD PRESSURE - DIASTOLIC 79   Body Fat % 24 %  Total Body Water (lbs) 107 lbs    ASK: We discussed the diagnosis of obesity with Karma Greaser today and Senna agreed to give [name] permission to discuss obesity behavioral modification therapy today.  ASSESS: Demond has the diagnosis of obesity and her BMI today is 36.45 Lorynn is in the action stage of change   ADVISE: Jacie was educated on the multiple health risks of obesity as well as the benefit of weight loss to improve her health. She was advised of the need for long term treatment and the importance of lifestyle modifications to  improve her current health and to decrease her risk of future health problems.  AGREE: Multiple  dietary modification options and treatment options were discussed and  Lakeesha agreed to follow the recommendations documented in the above note.  ARRANGE: Chelli was educated on the importance of frequent visits to treat obesity as outlined per CMS and USPSTF guidelines and agreed to schedule her next follow up appointment today.  Corey Skains, am acting as transcriptionist for Abby Potash, PA-C I, Abby Potash, PA-C have reviewed above note and agree with its content

## 2019-06-18 ENCOUNTER — Ambulatory Visit: Payer: No Typology Code available for payment source | Admitting: Psychiatry

## 2019-06-23 ENCOUNTER — Ambulatory Visit (INDEPENDENT_AMBULATORY_CARE_PROVIDER_SITE_OTHER): Payer: No Typology Code available for payment source | Admitting: Psychiatry

## 2019-06-23 ENCOUNTER — Encounter: Payer: Self-pay | Admitting: Psychiatry

## 2019-06-23 ENCOUNTER — Other Ambulatory Visit: Payer: Self-pay

## 2019-06-23 DIAGNOSIS — F411 Generalized anxiety disorder: Secondary | ICD-10-CM

## 2019-06-23 DIAGNOSIS — F5105 Insomnia due to other mental disorder: Secondary | ICD-10-CM

## 2019-06-23 DIAGNOSIS — F3341 Major depressive disorder, recurrent, in partial remission: Secondary | ICD-10-CM | POA: Diagnosis not present

## 2019-06-23 MED ORDER — BUSPIRONE HCL 10 MG PO TABS: 20.0000 mg | ORAL_TABLET | Freq: Every day | ORAL | 0 refills | Status: DC

## 2019-06-23 NOTE — Progress Notes (Signed)
Virtual Visit via Video Note  I connected with Rhonda [name] on 06/23/19 at  4:45 PM EST by a video enabled telemedicine application and verified that I am speaking with the correct person using two identifiers.   I discussed the limitations of evaluation and management by telemedicine and the availability of in person appointments. The patient expressed understanding and agreed to proceed.    I discussed the assessment and treatment plan with the patient. The patient was provided an opportunity to ask questions and all were answered. The patient agreed with the plan and demonstrated an understanding of the instructions.   The patient was advised to call back or seek an in-person evaluation if the symptoms worsen or if the condition fails to improve as anticipated.  [place] MD OP Progress Note  06/23/2019 5:03 PM Rhonda [name]  MRN:  [id number]  Chief Complaint:  Chief Complaint    Follow-up     HPI:  Crysal is a 33 year old Caucasian female, married, employed, Ship broker, has a history of depression, anxiety was evaluated by telemedicine today.  Patient today reports she is currently doing better on the current medication regimen.  She denies any depressive symptoms at this time.  Her sadness have improved.  She is able to focus at work.  She reports work is going well.  She reports sleep has improved.  She continues to take the Wellbutrin and the BuSpar as prescribed.  She denies side effects.  Patient denies any suicidality or homicidality or perceptual disturbances.  Patient denies any other concerns today.     Visit Diagnosis:    ICD-10-CM   1. MDD (major depressive disorder), recurrent, in partial remission (HCC)  F33.41 busPIRone (BUSPAR) 10 MG tablet  2. GAD (generalized anxiety disorder)  F41.1 busPIRone (BUSPAR) 10 MG tablet   stable  3. Insomnia due to mental condition  F51.05    stable    Past Psychiatric History: I have reviewed past psychiatric history from  my progress note on 11/05/2018.  Past Medical History:  Past Medical History:  Diagnosis Date  . Depression    treated  . PCOS (polycystic ovarian syndrome) 2008  . Plantar fasciitis     Past Surgical History:  Procedure Laterality Date  . CHOLECYSTECTOMY      Family Psychiatric History: Reviewed family psychiatric history from my progress note on 11/05/2018.  Family History:  Family History  Problem Relation Age of Onset  . High blood pressure Mother   . Depression Father   . Liver disease Father   . Alcoholism Father   . Alcohol abuse Father   . Diabetes Paternal Grandmother   . Bipolar disorder Maternal Aunt     Social History: Reviewed social history from my progress note on 11/05/2018. Social History   Socioeconomic History  . Marital status: Married    Spouse name: [name]  . Number of children: 2  . Years of education: Not on file  . Highest education level: Bachelor's degree (e.g., BA, AB, BS)  Occupational History  . Occupation: Therapist, sports  Social Needs  . Financial resource strain: Not hard at all  . Food insecurity    Worry: Never true    Inability: Never true  . Transportation needs    Medical: No    Non-medical: No  Tobacco Use  . Smoking status: Never Smoker  . Smokeless tobacco: Never Used  Substance and Sexual Activity  . Alcohol use: No  . Drug use: No  . Sexual activity: Yes  Birth control/protection: None  Lifestyle  . Physical activity    Days per week: 0 days    Minutes per session: 0 min  . Stress: Rather much  Relationships  . Social Herbalist on phone: Not on file    Gets together: Not on file    Attends religious service: More than 4 times per year    Active member of club or organization: No    Attends meetings of clubs or organizations: Never    Relationship status: Married  Other Topics Concern  . Not on file  Social History Narrative  . Not on file    Allergies: No Known Allergies  Metabolic Disorder  Labs: Lab Results  Component Value Date   HGBA1C 5.1 04/20/2019   No results found for: PROLACTIN Lab Results  Component Value Date   CHOL 153 01/09/2018   TRIG 63 01/09/2018   HDL 43 01/09/2018   LDLCALC 97 01/09/2018   Lab Results  Component Value Date   TSH 1.470 01/09/2018    Therapeutic Level Labs: No results found for: LITHIUM No results found for: VALPROATE No components found for:  CBMZ  Current Medications: Current Outpatient Medications  Medication Sig Dispense Refill  . buPROPion (WELLBUTRIN XL) 300 MG 24 hr tablet Take 1 tablet (300 mg total) by mouth daily. 90 tablet 0  . busPIRone (BUSPAR) 10 MG tablet Take 2 tablets (20 mg total) by mouth daily with breakfast. 180 tablet 0  . ELDERBERRY PO Take by mouth.    . fluconazole (DIFLUCAN) 150 MG tablet     . levonorgestrel (MIRENA) 20 MCG/24HR IUD 1 each by Intrauterine route once.    . Melatonin 5 MG CHEW Chew 5 mg by mouth at bedtime.    . meloxicam (MOBIC) 15 MG tablet Take by mouth.    . metFORMIN (GLUCOPHAGE) 500 MG tablet Take 1 tablet (500 mg total) by mouth daily with breakfast. 30 tablet 0  . Multiple Vitamin (MULTIVITAMIN) tablet Take 1 tablet by mouth daily.    Marland Kitchen spironolactone (ALDACTONE) 100 MG tablet Take by mouth.    . Vitamin D, Ergocalciferol, (DRISDOL) 1.25 MG (50000 UT) CAPS capsule Take 1 capsule (50,000 Units total) by mouth every 7 (seven) days. 4 capsule 0   No current facility-administered medications for this visit.      Musculoskeletal: Strength & Muscle Tone: UTA Gait & Station: Observed as seated Patient leans: N/A  Psychiatric Specialty Exam: Review of Systems  Psychiatric/Behavioral: Negative for depression, hallucinations, substance abuse and suicidal ideas. The patient is not nervous/anxious and does not have insomnia.   All other systems reviewed and are negative.   There were no vitals taken for this visit.There is no height or weight on file to calculate BMI.  General  Appearance: Casual  Eye Contact:  Fair  Speech:  Clear and Coherent  Volume:  Normal  Mood:  Euthymic  Affect:  Congruent  Thought Process:  Goal Directed and Descriptions of Associations: Intact  Orientation:  Full (Time, Place, and Person)  Thought Content: Logical   Suicidal Thoughts:  No  Homicidal Thoughts:  No  Memory:  Immediate;   Fair Recent;   Fair Remote;   Fair  Judgement:  Intact  Insight:  Fair  Psychomotor Activity:  Normal  Concentration:  Concentration: Fair and Attention Span: Fair  Recall:  [employer] of Knowledge: Fair  Language: Fair  Akathisia:  No  Handed:  Right  AIMS (if indicated): denies tremors,  rigidity  Assets:  Communication Skills Desire for Improvement Social Support  ADL's:  Intact  Cognition: WNL  Sleep:  Fair   Screenings: PHQ2-9     Office Visit from 01/09/2018 in [facility]  PHQ-2 Total Score  3  PHQ-9 Total Score  13       Assessment and Plan: Sruthi is a 33 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  She is biologically predisposed given her history of mental health problems in her family.  She also has psychosocial stressors of being in school, job related stressors, having children who needs help.  Patient is currently doing well on current medication regimen.  Plan as noted below  Plan MDD-improving Wellbutrin XL 300 mg p.o. daily BuSpar 20 mg p.o. daily in the a.m. Patient was referred for CBT-pending Continue L Methyl Folate 7.5 mg for folic acid conversion to reduction.  GAD-improving BuSpar 20 mg p.o. daily Patient was referred for CBT-pending  Insomnia-improved melatonin 3 to 9 mg p.o. nightly  GeneSight testing results were reviewed while making medication changes.  Follow-up in clinic in 2 months or sooner if needed.  January 12 at 4:45 PM  I have spent atleast 15 minutes non face to face with patient today. More  than 50 % of the time was spent for psychoeducation and supportive psychotherapy and care coordination. This note was generated in part or whole with voice recognition software. Voice recognition is usually quite accurate but there are transcription errors that can and very often do occur. I apologize for any typographical errors that were not detected and corrected.       Ursula Alert, MD 06/23/2019, 5:03 PM

## 2019-06-25 ENCOUNTER — Ambulatory Visit (INDEPENDENT_AMBULATORY_CARE_PROVIDER_SITE_OTHER): Payer: No Typology Code available for payment source | Admitting: Physician Assistant

## 2019-06-30 ENCOUNTER — Ambulatory Visit (INDEPENDENT_AMBULATORY_CARE_PROVIDER_SITE_OTHER): Payer: No Typology Code available for payment source | Admitting: Physician Assistant

## 2019-07-02 ENCOUNTER — Encounter (INDEPENDENT_AMBULATORY_CARE_PROVIDER_SITE_OTHER): Payer: Self-pay | Admitting: Physician Assistant

## 2019-07-02 ENCOUNTER — Telehealth (INDEPENDENT_AMBULATORY_CARE_PROVIDER_SITE_OTHER): Payer: No Typology Code available for payment source | Admitting: Physician Assistant

## 2019-07-02 ENCOUNTER — Other Ambulatory Visit: Payer: Self-pay

## 2019-07-02 DIAGNOSIS — E8881 Metabolic syndrome: Secondary | ICD-10-CM | POA: Diagnosis not present

## 2019-07-02 DIAGNOSIS — E559 Vitamin D deficiency, unspecified: Secondary | ICD-10-CM

## 2019-07-02 DIAGNOSIS — Z6836 Body mass index (BMI) 36.0-36.9, adult: Secondary | ICD-10-CM

## 2019-07-02 MED ORDER — METFORMIN HCL 500 MG PO TABS: 500.0000 mg | ORAL_TABLET | Freq: Every day | ORAL | 0 refills | Status: DC

## 2019-07-02 MED ORDER — VITAMIN D (ERGOCALCIFEROL) 1.25 MG (50000 UNIT) PO CAPS: 50000.0000 [IU] | ORAL_CAPSULE | ORAL | 0 refills | Status: DC

## 2019-07-06 ENCOUNTER — Telehealth: Payer: No Typology Code available for payment source | Admitting: Physician Assistant

## 2019-07-06 DIAGNOSIS — L249 Irritant contact dermatitis, unspecified cause: Secondary | ICD-10-CM | POA: Diagnosis not present

## 2019-07-06 MED ORDER — PREDNISONE 10 MG PO TABS: 10.0000 mg | ORAL_TABLET | Freq: Every day | ORAL | 0 refills | Status: AC

## 2019-07-06 NOTE — Progress Notes (Signed)
E Visit for Rash  We are sorry that you are not feeling well. Here is how we plan to help!  Based on what you shared with me it looks like you have contact dermatitis.  Contact dermatitis is a skin rash caused by something that touches the skin and causes irritation or inflammation.  Your skin may be red, swollen, dry, cracked, and itch.  The rash should go away in a few days but can last a few weeks.  If you get a rash, it's important to figure out what caused it so the irritant can be avoided in the future. and I have prescribed Prednisone 10 mg daily for 5 days   HOME CARE:   Take cool showers and avoid direct sunlight.  Apply cool compress or wet dressings.  Take a bath in an oatmeal bath.  Sprinkle content of one Aveeno packet under running faucet with comfortably warm water.  Bathe for 15-20 minutes, 1-2 times daily.  Pat dry with a towel. Do not rub the rash.  Use hydrocortisone cream.  Take an antihistamine like Benadryl for widespread rashes that itch.  The adult dose of Benadryl is 25-50 mg by mouth 4 times daily.  Caution:  This type of medication may cause sleepiness.  Do not drink alcohol, drive, or operate dangerous machinery while taking antihistamines.  Do not take these medications if you have prostate enlargement.  Read package instructions thoroughly on all medications that you take.  GET HELP RIGHT AWAY IF:   Symptoms don't go away after treatment.  Severe itching that persists.  If you rash spreads or swells.  If you rash begins to smell.  If it blisters and opens or develops a yellow-brown crust.  You develop a fever.  You have a sore throat.  You become short of breath.  MAKE SURE YOU:  Understand these instructions. Will watch your condition. Will get help right away if you are not doing well or get worse.  Thank you for choosing an e-visit. Your e-visit answers were reviewed by a board certified advanced clinical practitioner to complete your  personal care plan. Depending upon the condition, your plan could have included both over the counter or prescription medications. Please review your pharmacy choice. Be sure that the pharmacy you have chosen is open so that you can pick up your prescription now.  If there is a problem you may message your provider in [place] to have the prescription routed to another pharmacy. Your safety is important to [name]. If you have drug allergies check your prescription carefully.  For the next 24 hours, you can use MyChart to ask questions about today's visit, request a non-urgent call back, or ask for a work or school excuse from your e-visit provider. You will get an email in the next two days asking about your experience. I hope that your e-visit has been valuable and will speed your recovery.  Approximately 5 minutes was spent documenting and reviewing patient's chart.

## 2019-07-06 NOTE — Progress Notes (Signed)
Office: [phone]  /  Fax: [phone] TeleHealth Visit:  Rhonda [name] has verbally consented to this TeleHealth visit today. The patient is located at home, the provider is located at the [employer] [facility] office. The participants in this visit include the listed provider and patient and any and all parties involved. The visit was conducted today via FaceTime.  HPI:   Chief Complaint: OBESITY Rhonda [name] is here to discuss her progress with her obesity treatment plan. She is on the Category 2 plan and is following her eating plan approximately 50 % of the time. She states she is exercising 0 minutes 0 times per week. Rhonda [name]'s most recent weight was at 259 pounds (07/02/19). She reports that she has been eating out a lot more for breakfast and lunch. She's ready to get back on track. We were unable to weigh the patient today for this TeleHealth visit. She feels as if she has gained weight since her last visit. She has lost 19 lbs since starting treatment with [name].  Vitamin D deficiency Rhonda [name] has a diagnosis of vitamin D deficiency. Rhonda [name] is currently taking vit D and she denies nausea, vomiting or muscle weakness.  Insulin Resistance Rhonda [name] has a diagnosis of insulin resistance based on her elevated fasting insulin level >5. Although Rhonda [name]'s blood glucose readings are still under good control, insulin resistance puts her at greater risk of metabolic syndrome and diabetes. Rhonda [name] is on metformin and she denies nausea, vomiting, diarrhea or polyphagia. She continues to work on diet and exercise to decrease risk of diabetes.  ASSESSMENT AND PLAN:  Vitamin D deficiency - Plan: Vitamin D, Ergocalciferol, (DRISDOL) 1.25 MG (50000 UT) CAPS capsule  Insulin resistance - Plan: metFORMIN (GLUCOPHAGE) 500 MG tablet  Class 2 severe obesity with serious comorbidity and body mass index (BMI) of 36.0 to 36.9 in adult, unspecified obesity type ([place])  PLAN:  Vitamin D  Deficiency Rhonda [name] was informed that low vitamin D levels contributes to fatigue and are associated with obesity, breast, and colon cancer. Rhonda [name] agrees to continue to take prescription Vit D @50 ,000 IU every week #4 with no refills and she will follow up for routine testing of vitamin D, at least 2-3 times per year. She was informed of the risk of over-replacement of vitamin D and agrees to not increase her dose unless she discusses this with [name] first. Rhonda [name] agrees to follow up with our clinic in 3 weeks.  Insulin Resistance Rhonda [name] will continue to work on weight loss, exercise, and decreasing simple carbohydrates in her diet to help decrease the risk of diabetes. We dicussed metformin including benefits and risks. She was informed that eating too many simple carbohydrates or too many calories at one sitting increases the likelihood of GI side effects. Rhonda [name] agrees to continue metformin 500 mg daily with breakfast #30 with no refills and follow up with [name] as directed to monitor her progress.  Obesity Rhonda [name] is currently in the action stage of change. As such, her goal is to continue with weight loss efforts She has agreed to follow the Category 2 plan Rhonda [name] has been instructed to work up to a goal of 150 minutes of combined cardio and strengthening exercise per week for weight loss and overall health benefits. We discussed the following Behavioral Modification Strategies today: work on meal planning and easy cooking plans and holiday eating strategies   Rhonda [name] has agreed to follow up with our clinic in 3 weeks. She was informed of the importance of frequent  follow up visits to maximize her success with intensive lifestyle modifications for her multiple health conditions.  ALLERGIES: No Known Allergies  MEDICATIONS: Current Outpatient Medications on File Prior to Visit  Medication Sig Dispense Refill   buPROPion (WELLBUTRIN XL) 300 MG 24 hr tablet Take 1 tablet (300 mg total) by  mouth daily. 90 tablet 0   busPIRone (BUSPAR) 10 MG tablet Take 2 tablets (20 mg total) by mouth daily with breakfast. 180 tablet 0   ELDERBERRY PO Take by mouth.     fluconazole (DIFLUCAN) 150 MG tablet      levonorgestrel (MIRENA) 20 MCG/24HR IUD 1 each by Intrauterine route once.     Melatonin 5 MG CHEW Chew 5 mg by mouth at bedtime.     meloxicam (MOBIC) 15 MG tablet Take by mouth.     Multiple Vitamin (MULTIVITAMIN) tablet Take 1 tablet by mouth daily.     spironolactone (ALDACTONE) 100 MG tablet Take by mouth.     No current facility-administered medications on file prior to visit.     PAST MEDICAL HISTORY: Past Medical History:  Diagnosis Date   Depression    treated   PCOS (polycystic ovarian syndrome) 2008   Plantar fasciitis     PAST SURGICAL HISTORY: Past Surgical History:  Procedure Laterality Date   CHOLECYSTECTOMY      SOCIAL HISTORY: Social History   Tobacco Use   Smoking status: Never Smoker   Smokeless tobacco: Never Used  Substance Use Topics   Alcohol use: No   Drug use: No    FAMILY HISTORY: Family History  Problem Relation Age of Onset   High blood pressure Mother    Depression Father    Liver disease Father    Alcoholism Father    Alcohol abuse Father    Diabetes Paternal Grandmother    Bipolar disorder Maternal Aunt     ROS: Review of Systems  Constitutional: Negative for weight loss.  Gastrointestinal: Negative for diarrhea, nausea and vomiting.  Musculoskeletal:       Negative for muscle weakness  Endo/Heme/Allergies:       Negative for polyphagia    PHYSICAL EXAM: Pt in no acute distress  RECENT LABS AND TESTS: BMET    Component Value Date/Time   NA 139 04/20/2019 0830   NA 142 08/06/2014 0215   K 4.4 04/20/2019 0830   K 3.5 08/06/2014 0215   CL 104 04/20/2019 0830   CL 108 (H) 08/06/2014 0215   CO2 22 04/20/2019 0830   CO2 20 (L) 08/06/2014 0215   GLUCOSE 82 04/20/2019 0830   GLUCOSE 74  09/18/2016 1804   GLUCOSE 113 (H) 08/06/2014 0215   BUN 12 04/20/2019 0830   BUN 15 08/06/2014 0215   CREATININE 0.96 04/20/2019 0830   CREATININE 1.00 08/06/2014 0215   CALCIUM 9.7 04/20/2019 0830   CALCIUM 9.3 08/06/2014 0215   GFRNONAA 78 04/20/2019 0830   GFRNONAA >60 08/06/2014 0215   GFRAA 90 04/20/2019 0830   GFRAA >60 08/06/2014 0215   Lab Results  Component Value Date   HGBA1C 5.1 04/20/2019   HGBA1C 5.2 10/09/2018   HGBA1C 5.2 01/09/2018   Lab Results  Component Value Date   INSULIN 13.8 04/20/2019   INSULIN 13.0 10/09/2018   INSULIN 16.5 01/09/2018   CBC    Component Value Date/Time   WBC 4.7 01/09/2018 1116   WBC 7.8 09/20/2016 0621   RBC 4.75 01/09/2018 1116   RBC 3.81 09/20/2016 0621   HGB 14.5 01/09/2018  1116   HCT 42.1 01/09/2018 1116   PLT 195 09/20/2016 0621   PLT 300 08/06/2014 0215   MCV 89 01/09/2018 1116   MCV 92 08/06/2014 0215   MCH 30.5 01/09/2018 1116   MCH 31.4 09/20/2016 0621   MCHC 34.4 01/09/2018 1116   MCHC 35.2 09/20/2016 0621   RDW 13.7 01/09/2018 1116   RDW 13.3 08/06/2014 0215   LYMPHSABS 1.3 01/09/2018 1116   LYMPHSABS 0.4 (L) 08/06/2014 0215   MONOABS 0.7 08/06/2014 0215   EOSABS 0.1 01/09/2018 1116   EOSABS 0.1 08/06/2014 0215   BASOSABS 0.0 01/09/2018 1116   BASOSABS 0.2 (H) 08/06/2014 0215   Iron/TIBC/Ferritin/ %Sat No results found for: IRON, TIBC, FERRITIN, IRONPCTSAT Lipid Panel     Component Value Date/Time   CHOL 153 01/09/2018 1116   TRIG 63 01/09/2018 1116   HDL 43 01/09/2018 1116   LDLCALC 97 01/09/2018 1116   Hepatic Function Panel     Component Value Date/Time   PROT 7.1 04/20/2019 0830   PROT 8.5 (H) 08/06/2014 0215   ALBUMIN 4.5 04/20/2019 0830   ALBUMIN 4.3 08/06/2014 0215   AST 27 04/20/2019 0830   AST 33 08/06/2014 0215   ALT 36 (H) 04/20/2019 0830   ALT 58 08/06/2014 0215   ALKPHOS 64 04/20/2019 0830   ALKPHOS 115 08/06/2014 0215   BILITOT 0.3 04/20/2019 0830   BILITOT 0.7 08/06/2014  0215      Component Value Date/Time   TSH 1.470 01/09/2018 1116     Ref. Range 04/20/2019 08:30  Vitamin D, 25-Hydroxy Latest Ref Range: 30.0 - 100.0 ng/mL 34.9    I, Rhonda [name], am acting as Location manager for Abby Potash, PA-C I, Abby Potash, PA-C have reviewed above note and agree with its content

## 2019-07-23 ENCOUNTER — Encounter (INDEPENDENT_AMBULATORY_CARE_PROVIDER_SITE_OTHER): Payer: Self-pay | Admitting: Physician Assistant

## 2019-07-27 ENCOUNTER — Ambulatory Visit (INDEPENDENT_AMBULATORY_CARE_PROVIDER_SITE_OTHER): Payer: No Typology Code available for payment source | Admitting: Physician Assistant

## 2019-08-10 ENCOUNTER — Encounter (INDEPENDENT_AMBULATORY_CARE_PROVIDER_SITE_OTHER): Payer: Self-pay | Admitting: Physician Assistant

## 2019-08-10 DIAGNOSIS — E559 Vitamin D deficiency, unspecified: Secondary | ICD-10-CM

## 2019-08-10 DIAGNOSIS — E8881 Metabolic syndrome: Secondary | ICD-10-CM

## 2019-08-10 NOTE — Telephone Encounter (Signed)
FYI, patient requested this date.  There are earlier dates with afternoon appts available.

## 2019-08-11 MED ORDER — METFORMIN HCL 500 MG PO TABS: 500.0000 mg | ORAL_TABLET | Freq: Every day | ORAL | 0 refills | Status: DC

## 2019-08-11 MED ORDER — VITAMIN D (ERGOCALCIFEROL) 1.25 MG (50000 UNIT) PO CAPS: 50000.0000 [IU] | ORAL_CAPSULE | ORAL | 0 refills | Status: DC

## 2019-08-11 NOTE — Telephone Encounter (Signed)
Please advise 

## 2019-08-11 NOTE — Telephone Encounter (Signed)
Go ahead and order a months worth of each and she will need labs before she runs out. See if she will set an appointment prior to her meds running out please.

## 2019-08-13 ENCOUNTER — Other Ambulatory Visit: Payer: No Typology Code available for payment source

## 2019-08-15 ENCOUNTER — Inpatient Hospital Stay
Admission: RE | Admit: 2019-08-15 | Discharge: 2019-08-15 | Disposition: A | Discharge status: Home / Self Care | Payer: No Typology Code available for payment source | Source: Ambulatory Visit

## 2019-09-01 ENCOUNTER — Ambulatory Visit: Payer: No Typology Code available for payment source | Admitting: Psychiatry

## 2019-09-03 ENCOUNTER — Ambulatory Visit (INDEPENDENT_AMBULATORY_CARE_PROVIDER_SITE_OTHER): Payer: No Typology Code available for payment source | Admitting: Physician Assistant

## 2019-09-07 DIAGNOSIS — M7582 Other shoulder lesions, left shoulder: Secondary | ICD-10-CM | POA: Insufficient documentation

## 2019-09-07 DIAGNOSIS — G5622 Lesion of ulnar nerve, left upper limb: Secondary | ICD-10-CM | POA: Insufficient documentation

## 2019-09-15 ENCOUNTER — Encounter (INDEPENDENT_AMBULATORY_CARE_PROVIDER_SITE_OTHER): Payer: Self-pay | Admitting: Physician Assistant

## 2019-09-15 ENCOUNTER — Other Ambulatory Visit: Payer: Self-pay

## 2019-09-15 ENCOUNTER — Telehealth (INDEPENDENT_AMBULATORY_CARE_PROVIDER_SITE_OTHER): Payer: No Typology Code available for payment source | Admitting: Physician Assistant

## 2019-09-15 DIAGNOSIS — E8881 Metabolic syndrome: Secondary | ICD-10-CM

## 2019-09-15 DIAGNOSIS — F3289 Other specified depressive episodes: Secondary | ICD-10-CM | POA: Diagnosis not present

## 2019-09-15 DIAGNOSIS — E559 Vitamin D deficiency, unspecified: Secondary | ICD-10-CM | POA: Diagnosis not present

## 2019-09-15 DIAGNOSIS — Z6836 Body mass index (BMI) 36.0-36.9, adult: Secondary | ICD-10-CM

## 2019-09-15 MED ORDER — VITAMIN D (ERGOCALCIFEROL) 1.25 MG (50000 UNIT) PO CAPS: 50000.0000 [IU] | ORAL_CAPSULE | ORAL | 0 refills | Status: DC

## 2019-09-15 MED ORDER — METFORMIN HCL 500 MG PO TABS: 500.0000 mg | ORAL_TABLET | Freq: Every day | ORAL | 0 refills | Status: DC

## 2019-09-15 NOTE — Progress Notes (Signed)
TeleHealth Visit:  Due to the COVID-19 pandemic, this visit was completed with telemedicine (audio/video) technology to reduce patient and provider exposure as well as to preserve personal protective equipment.   Rhonda [name] has verbally consented to this TeleHealth visit. The patient is located in the car, the provider is located at the [employer] [facility] office. The participants in this visit include the listed provider and patient. The visit was conducted today via FaceTime.  Chief Complaint: OBESITY Rhonda [name] is here to discuss her progress with her obesity treatment plan along with follow-up of her obesity related diagnoses. Rhonda [name] is on the Category 2 Plan and states she is following her eating plan approximately 50% of the time. Rhonda [name] states she is exercising 0 minutes 0 times per week.  Today's visit was #: 23 Starting weight: 273 lbs Starting date: 01/09/2018  Interim History: Rhonda [name]'s recent weight was 265 lbs. She states that she is "back in her old habits." She is going to [facility] more often for coffee.  Subjective:   Vitamin D deficiency. Rhonda [name]'s last Vitamin D level was 34.9 on 04/20/2019. She is on Vitamin D weekly and is due for labs.  Insulin resistance. Rhonda [name] has a diagnosis of insulin resistance based on her elevated fasting insulin level >5. She continues to work on diet and exercise to decrease her risk of diabetes. Rhonda [name] is on metformin. No nausea, vomiting, or diarrhea.  Lab Results  Component Value Date   INSULIN 13.8 04/20/2019   INSULIN 13.0 10/09/2018   INSULIN 16.5 01/09/2018   Lab Results  Component Value Date   HGBA1C 5.1 04/20/2019   Other depression, with emtional eating. Rhonda [name] is struggling with emotional eating and using food for comfort to the extent that it is negatively impacting her health. She has been working on behavior modification techniques to help reduce her emotional eating and has been somewhat successful.  She shows no sign of suicidal or homicidal ideations. Rhonda [name] reports she has been doing some stress eating recently. She is seeing Psychiatry every 2 months.  Assessment/Plan:   Vitamin D deficiency.  Low Vitamin D level contributes to fatigue and are associated with obesity, breast, and colon cancer. She was given a refill on prescription VITAMIN D 25 Hydroxy (Vit-D Deficiency, Fractures), Vitamin D, Ergocalciferol, (DRISDOL) 1.25 MG (50000 UNIT) CAPS capsule every week #4 with 0 refills and will follow-up for routine testing of Vitamin D, at least 2-3 times per year to avoid over-replacement.    Insulin resistance. Rhonda [name] will continue to work on weight loss, exercise, and decreasing simple carbohydrates to help decrease the risk of diabetes. Rhonda [name] agreed to follow-up with [name] as directed to closely monitor her progress. She was given a refill on her metFORMIN (GLUCOPHAGE) 500 MG tablet #30 with 0 refills. Comprehensive metabolic panel, Hemoglobin A1c, Insulin, random labs ordered today.  Other depression, with emtional eating. Behavior modification techniques were discussed today to help Rhonda [name] deal with her emotional/non-hunger eating behaviors.  Orders and follow up as documented in patient record. Rhonda [name] will be referred to Dr. Mallie Mussel, our bariatric psychologist, for evaluation.  Class 2 severe obesity with serious comorbidity and body mass index (BMI) of 36.0 to 36.9 in adult, unspecified obesity type ([place]).  Rhonda [name] is currently in the action stage of change. As such, her goal is to continue with weight loss efforts. She has agreed to the Category 2 Plan.   Exercise goals: For substantial health benefits, adults should do at least 150 minutes (2  hours and 30 minutes) a week of moderate-intensity, or 75 minutes (1 hour and 15 minutes) a week of vigorous-intensity aerobic physical activity, or an equivalent combination of moderate- and vigorous-intensity aerobic activity. Aerobic activity  should be performed in episodes of at least 10 minutes, and preferably, it should be spread throughout the week. Adults should also include muscle-strengthening activities that involve all major muscle groups on 2 or more days a week.  Behavioral modification strategies: meal planning and cooking strategies and planning for success.  Rhonda [name] has agreed to follow-up with our clinic in 2 weeks. She was informed of the importance of frequent follow-up visits to maximize her success with intensive lifestyle modifications for her multiple health conditions.  Rhonda [name] was informed we would discuss her lab results at her next visit unless there is a critical issue that needs to be addressed sooner. Rhonda [name] agreed to keep her next visit at the agreed upon time to discuss these results.  Objective:   VITALS: Per patient if applicable, see vitals. GENERAL: Alert and in no acute distress. CARDIOPULMONARY: No increased WOB. Speaking in clear sentences.  PSYCH: Pleasant and cooperative. Speech normal rate and rhythm. Affect is appropriate. Insight and judgement are appropriate. Attention is focused, linear, and appropriate.  NEURO: Oriented as arrived to appointment on time with no prompting.   Lab Results  Component Value Date   CREATININE 0.96 04/20/2019   BUN 12 04/20/2019   NA 139 04/20/2019   K 4.4 04/20/2019   CL 104 04/20/2019   CO2 22 04/20/2019   Lab Results  Component Value Date   ALT 36 (H) 04/20/2019   AST 27 04/20/2019   ALKPHOS 64 04/20/2019   BILITOT 0.3 04/20/2019   Lab Results  Component Value Date   HGBA1C 5.1 04/20/2019   HGBA1C 5.2 10/09/2018   HGBA1C 5.2 01/09/2018   Lab Results  Component Value Date   INSULIN 13.8 04/20/2019   INSULIN 13.0 10/09/2018   INSULIN 16.5 01/09/2018   Lab Results  Component Value Date   TSH 1.470 01/09/2018   Lab Results  Component Value Date   CHOL 153 01/09/2018   HDL 43 01/09/2018   LDLCALC 97 01/09/2018   TRIG 63 01/09/2018     Lab Results  Component Value Date   WBC 4.7 01/09/2018   HGB 14.5 01/09/2018   HCT 42.1 01/09/2018   MCV 89 01/09/2018   PLT 195 09/20/2016   No results found for: IRON, TIBC, FERRITIN  Attestation Statements:   Reviewed by clinician on day of visit: allergies, medications, problem list, medical history, surgical history, family history, social history, and previous encounter notes.  IMichaelene Song, am acting as transcriptionist for Abby Potash, PA-C   I have reviewed the above documentation for accuracy and completeness, and I agree with the above. Abby Potash, PA-C

## 2019-09-16 ENCOUNTER — Encounter (INDEPENDENT_AMBULATORY_CARE_PROVIDER_SITE_OTHER): Payer: Self-pay | Admitting: Physician Assistant

## 2019-09-21 ENCOUNTER — Telehealth (INDEPENDENT_AMBULATORY_CARE_PROVIDER_SITE_OTHER): Payer: No Typology Code available for payment source | Admitting: Physician Assistant

## 2019-09-22 LAB — COMPREHENSIVE METABOLIC PANEL WITH GFR
ALT: 47 IU/L — ABNORMAL HIGH (ref 0–32)
AST: 29 IU/L (ref 0–40)
Albumin/Globulin Ratio: 2.4 — ABNORMAL HIGH (ref 1.2–2.2)
Albumin: 4.8 g/dL (ref 3.8–4.8)
Alkaline Phosphatase: 70 IU/L (ref 39–117)
BUN/Creatinine Ratio: 10 (ref 9–23)
BUN: 9 mg/dL (ref 6–20)
Bilirubin Total: 0.3 mg/dL (ref 0.0–1.2)
CO2: 20 mmol/L (ref 20–29)
Calcium: 9.7 mg/dL (ref 8.7–10.2)
Chloride: 103 mmol/L (ref 96–106)
Creatinine, Ser: 0.89 mg/dL (ref 0.57–1.00)
GFR calc Af Amer: 98 mL/min/1.73
GFR calc non Af Amer: 85 mL/min/1.73
Globulin, Total: 2 g/dL (ref 1.5–4.5)
Glucose: 82 mg/dL (ref 65–99)
Potassium: 4.5 mmol/L (ref 3.5–5.2)
Sodium: 140 mmol/L (ref 134–144)
Total Protein: 6.8 g/dL (ref 6.0–8.5)

## 2019-09-22 LAB — INSULIN, RANDOM: INSULIN: 20.3 u[IU]/mL (ref 2.6–24.9)

## 2019-09-22 LAB — HEMOGLOBIN A1C
Est. average glucose Bld gHb Est-mCnc: 103 mg/dL
Hgb A1c MFr Bld: 5.2 % (ref 4.8–5.6)

## 2019-09-22 LAB — VITAMIN D 25 HYDROXY (VIT D DEFICIENCY, FRACTURES): Vit D, 25-Hydroxy: 34.4 ng/mL (ref 30.0–100.0)

## 2019-09-29 ENCOUNTER — Telehealth (INDEPENDENT_AMBULATORY_CARE_PROVIDER_SITE_OTHER): Payer: No Typology Code available for payment source | Admitting: Family Medicine

## 2019-10-01 ENCOUNTER — Other Ambulatory Visit: Payer: Self-pay

## 2019-10-01 ENCOUNTER — Encounter: Payer: Self-pay | Admitting: Psychiatry

## 2019-10-01 ENCOUNTER — Ambulatory Visit (INDEPENDENT_AMBULATORY_CARE_PROVIDER_SITE_OTHER): Payer: No Typology Code available for payment source | Admitting: Psychiatry

## 2019-10-01 DIAGNOSIS — F411 Generalized anxiety disorder: Secondary | ICD-10-CM | POA: Diagnosis not present

## 2019-10-01 DIAGNOSIS — F5105 Insomnia due to other mental disorder: Secondary | ICD-10-CM | POA: Diagnosis not present

## 2019-10-01 DIAGNOSIS — F3342 Major depressive disorder, recurrent, in full remission: Secondary | ICD-10-CM

## 2019-10-01 DIAGNOSIS — F3341 Major depressive disorder, recurrent, in partial remission: Secondary | ICD-10-CM | POA: Insufficient documentation

## 2019-10-01 MED ORDER — BUSPIRONE HCL 10 MG PO TABS: 20.0000 mg | ORAL_TABLET | Freq: Every day | ORAL | 0 refills | Status: DC

## 2019-10-01 MED ORDER — BUPROPION HCL ER (XL) 300 MG PO TB24: 300.0000 mg | ORAL_TABLET | Freq: Every day | ORAL | 0 refills | Status: DC

## 2019-10-01 NOTE — Progress Notes (Signed)
Provider Location : [facility] Patient Location : Home  Virtual Visit via Video Note  I connected with Rhonda [name] on 10/01/19 at  3:30 PM EST by a video enabled telemedicine application and verified that I am speaking with the correct person using two identifiers.   I discussed the limitations of evaluation and management by telemedicine and the availability of in person appointments. The patient expressed understanding and agreed to proceed.     I discussed the assessment and treatment plan with the patient. The patient was provided an opportunity to ask questions and all were answered. The patient agreed with the plan and demonstrated an understanding of the instructions.   The patient was advised to call back or seek an in-person evaluation if the symptoms worsen or if the condition fails to improve as anticipated.   [facility] MD OP Progress Note  10/01/2019 5:14 PM Rhonda [name]  MRN:  [id number]  Chief Complaint:  Chief Complaint    Follow-up     HPI: Rhonda [name] is a 34 year old Caucasian female, married, employed, Ship broker, has a history of depression, anxiety was evaluated by telemedicine today.  Patient today reports she is currently doing well on the current medication regimen.  She is tolerating the Wellbutrin well.  She however reports when she takes the BuSpar 20 mg in the morning she does notice dizziness which does not bother her much.  And it goes away after a few minutes.   Patient denies any suicidality, homicidality or perceptual disturbances.  She reports she is currently working full-time and taking classes part-time.  She will graduate only in 2023 however that is okay since she could not do everything together.  She reports sleep and appetite is fair.  Patient denies any other concerns today. Visit Diagnosis:    ICD-10-CM   1. MDD (major depressive disorder), recurrent, in full remission ([place])  F33.42 busPIRone (BUSPAR) 10 MG tablet  2. GAD (generalized  anxiety disorder)  F41.1   3. Insomnia due to mental condition  F51.05 buPROPion (WELLBUTRIN XL) 300 MG 24 hr tablet    Past Psychiatric History: I have reviewed past psychiatric history from my progress note on 11/05/2018.  Past Medical History:  Past Medical History:  Diagnosis Date  . Depression    treated  . PCOS (polycystic ovarian syndrome) 2008  . Plantar fasciitis     Past Surgical History:  Procedure Laterality Date  . CHOLECYSTECTOMY      Family Psychiatric History: Reviewed family psychiatric history from my progress note on 11/05/2018.  Family History:  Family History  Problem Relation Age of Onset  . High blood pressure Mother   . Depression Father   . Liver disease Father   . Alcoholism Father   . Alcohol abuse Father   . Diabetes Paternal Grandmother   . Bipolar disorder Maternal Aunt     Social History: Reviewed social history from my progress note on 11/05/2018. Social History   Socioeconomic History  . Marital status: Married    Spouse name: [name]  . Number of children: 2  . Years of education: Not on file  . Highest education level: Bachelor's degree (e.g., BA, AB, BS)  Occupational History  . Occupation: Therapist, sports  Tobacco Use  . Smoking status: Never Smoker  . Smokeless tobacco: Never Used  Substance and Sexual Activity  . Alcohol use: No  . Drug use: No  . Sexual activity: Yes    Birth control/protection: None  Other Topics Concern  . Not on  file  Social History Narrative  . Not on file   Social Determinants of Health   Financial Resource Strain: Low Risk   . Difficulty of Paying Living Expenses: Not hard at all  Food Insecurity: No Food Insecurity  . Worried About Charity fundraiser in the Last Year: Never true  . Ran Out of Food in the Last Year: Never true  Transportation Needs: No Transportation Needs  . Lack of Transportation (Medical): No  . Lack of Transportation (Non-Medical): No  Physical Activity: Inactive  . Days of  Exercise per Week: 0 days  . Minutes of Exercise per Session: 0 min  Stress: Stress Concern Present  . Feeling of Stress : Rather much  Social Connections: Unknown  . Frequency of Communication with Friends and Family: Not on file  . Frequency of Social Gatherings with Friends and Family: Not on file  . Attends Religious Services: More than 4 times per year  . Active Member of Clubs or Organizations: No  . Attends Archivist Meetings: Never  . Marital Status: Married    Allergies: No Known Allergies  Metabolic Disorder Labs: Lab Results  Component Value Date   HGBA1C 5.2 09/21/2019   No results found for: PROLACTIN Lab Results  Component Value Date   CHOL 153 01/09/2018   TRIG 63 01/09/2018   HDL 43 01/09/2018   LDLCALC 97 01/09/2018   Lab Results  Component Value Date   TSH 1.470 01/09/2018    Therapeutic Level Labs: No results found for: LITHIUM No results found for: VALPROATE No components found for:  CBMZ  Current Medications: Current Outpatient Medications  Medication Sig Dispense Refill  . clindamycin (CLEOCIN T) 1 % lotion Apply topically.    . cyclobenzaprine (FLEXERIL) 5 MG tablet Take by mouth.    Marland Kitchen buPROPion (WELLBUTRIN XL) 300 MG 24 hr tablet Take 1 tablet (300 mg total) by mouth daily. 90 tablet 0  . busPIRone (BUSPAR) 10 MG tablet Take 2 tablets (20 mg total) by mouth daily with breakfast. 180 tablet 0  . clindamycin (CLEOCIN T) 1 % lotion     . cyclobenzaprine (FLEXERIL) 5 MG tablet Take 5 mg by mouth at bedtime as needed.    Marland Kitchen ELDERBERRY PO Take by mouth.    . fluconazole (DIFLUCAN) 150 MG tablet     . levonorgestrel (MIRENA) 20 MCG/24HR IUD 1 each by Intrauterine route once.    . Melatonin 5 MG CHEW Chew 5 mg by mouth at bedtime.    . meloxicam (MOBIC) 15 MG tablet Take by mouth.    . metFORMIN (GLUCOPHAGE) 500 MG tablet Take 1 tablet (500 mg total) by mouth daily with breakfast. 30 tablet 0  . Multiple Vitamin (MULTIVITAMIN) tablet Take  1 tablet by mouth daily.    Marland Kitchen spironolactone (ALDACTONE) 100 MG tablet Take by mouth.    . Vitamin D, Ergocalciferol, (DRISDOL) 1.25 MG (50000 UNIT) CAPS capsule Take 1 capsule (50,000 Units total) by mouth every 7 (seven) days. 4 capsule 0   No current facility-administered medications for this visit.     Musculoskeletal: Strength & Muscle Tone: UTA Gait & Station: normal Patient leans: N/A  Psychiatric Specialty Exam: Review of Systems  Neurological: Positive for dizziness.  Psychiatric/Behavioral: Negative for agitation, behavioral problems, confusion, decreased concentration, dysphoric mood, hallucinations, self-injury, sleep disturbance and suicidal ideas. The patient is not nervous/anxious and is not hyperactive.   All other systems reviewed and are negative.   There were no vitals taken  for this visit.There is no height or weight on file to calculate BMI.  General Appearance: Casual  Eye Contact:  Good  Speech:  Clear and Coherent  Volume:  Normal  Mood:  Euthymic  Affect:  Congruent  Thought Process:  Goal Directed and Descriptions of Associations: Intact  Orientation:  Full (Time, Place, and Person)  Thought Content: Logical   Suicidal Thoughts:  No  Homicidal Thoughts:  No  Memory:  Immediate;   Fair Recent;   Fair Remote;   Fair  Judgement:  Fair  Insight:  Fair  Psychomotor Activity:  Normal  Concentration:  Concentration: Fair and Attention Span: Fair  Recall:  [employer] of Knowledge: Fair  Language: Fair  Akathisia:  No  Handed:  Right  AIMS (if indicated): Denies tremors, rigidity  Assets:  Communication Skills Desire for Improvement Housing Social Support  ADL's:  Intact  Cognition: WNL  Sleep:  Fair   Screenings: PHQ2-9     Office Visit from 01/09/2018 in [place]  PHQ-2 Total Score  3  PHQ-9 Total Score  13       Assessment and Plan: Kiyah is a 34 year old 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  She is biologically predisposed given her history of mental health problems in her family.  She also has psychosocial stressors of being in school, job related stressors, having children at home who needs her help and the pandemic.  Patient however is currently stable on medications.  She however does have possible side effect to BuSpar.  Plan as noted below. Plan MDD-in remission Wellbutrin XL 300 mg p.o. daily Advised patient to take BuSpar in divided dosage of 10 mg p.o. twice daily Continue l-methylfolate 7.5 mg for folic acid conversion reduction  GAD-improving BuSpar 20 mg p.o. daily in divided doses   Insomnia-improved Melatonin 3 to 9 mg p.o. nightly  GeneSight testing results were reviewed while making medication changes.  Follow-up in clinic in 3 months or sooner if needed.  May 13 at 4 PM  I have spent atleast 20 minutes non face to face with patient today. More than 50 % of the time was spent for ordering medications and test ,psychoeducation and supportive psychotherapy and care coordination,as well as documenting clinical information in electronic health record. This note was generated in part or whole with voice recognition software. Voice recognition is usually quite accurate but there are transcription errors that can and very often do occur. I apologize for any typographical errors that were not detected and corrected.      Ursula Alert, MD 10/01/2019, 5:14 PM

## 2019-10-05 ENCOUNTER — Other Ambulatory Visit: Payer: Self-pay

## 2019-10-05 ENCOUNTER — Encounter (INDEPENDENT_AMBULATORY_CARE_PROVIDER_SITE_OTHER): Payer: Self-pay | Admitting: Physician Assistant

## 2019-10-05 ENCOUNTER — Telehealth (INDEPENDENT_AMBULATORY_CARE_PROVIDER_SITE_OTHER): Payer: No Typology Code available for payment source | Admitting: Physician Assistant

## 2019-10-05 DIAGNOSIS — Z6836 Body mass index (BMI) 36.0-36.9, adult: Secondary | ICD-10-CM

## 2019-10-05 DIAGNOSIS — E559 Vitamin D deficiency, unspecified: Secondary | ICD-10-CM | POA: Diagnosis not present

## 2019-10-05 MED ORDER — VITAMIN D (ERGOCALCIFEROL) 1.25 MG (50000 UNIT) PO CAPS: 50000.0000 [IU] | ORAL_CAPSULE | ORAL | 0 refills | Status: DC

## 2019-10-06 NOTE — Progress Notes (Signed)
Office: [phone]  /  Fax: [phone]    Date: October 20, 2019   Appointment Start Time: 3:02pm Duration: 41 minutes Provider: Glennie Isle, Psy.D. Type of Session: Intake for Individual Therapy  Location of Patient: Home Location of Provider: Provider's Home Type of Contact: Telepsychological Visit via [employer] WebEx & Telephone call  Informed Consent: Prior to proceeding with today's appointment, two pieces of identifying information were obtained. In addition, Daneesha's physical location at the time of this appointment was obtained as well a phone number she could be reached at in the event of technical difficulties. Kwana and this provider participated in today's telepsychological service. Of note, today's appointment was switched to a regular telephone call at 3:30pm due to audio difficulties with Webex.   The provider's role was explained to [employer]. The provider reviewed and discussed issues of confidentiality, privacy, and limits therein (e.g., reporting obligations). In addition to verbal informed consent, written informed consent for psychological services was obtained prior to the initial appointment. Since the clinic is not a 24/7 crisis center, mental health emergency resources were shared and this  provider explained MyChart, e-mail, voicemail, and/or other messaging systems should be utilized only for non-emergency reasons. This provider also explained that information obtained during appointments will be placed in Mazzie's medical record and relevant information will be shared with other providers at [facility] for coordination of care. Moreover, Averley agreed information may be shared with other [facility] providers as needed for coordination of care. By signing the service agreement document, Arcenia provided written consent for coordination of care. Prior to initiating telepsychological services, Mellony completed an informed consent  document, which included the development of a safety plan (i.e., an emergency contact, nearest emergency room, and emergency resources) in the event of an emergency/crisis. Hannahgrace expressed understanding of the rationale of the safety plan. Nikima verbally acknowledged understanding she is ultimately responsible for understanding her insurance benefits for telepsychological and in-person services. This provider also reviewed confidentiality, as it relates to telepsychological services, as well as the rationale for telepsychological services (i.e., to reduce exposure risk to COVID-19). Shatonna  acknowledged understanding that appointments cannot be recorded without both party consent and she is aware she is responsible for securing confidentiality on her end of the session. Pamla verbally consented to proceed.  Chief Complaint/HPI: Chloe was referred by Abby Potash, PA-C on October 05, 2019. During the initial appointment with Dr. Ilene Qua at [facility] on Jan 09, 2018, Tynequa reported experiencing the following: significant food cravings issues , frequently drinking liquids with calories, frequently making poor food choices, frequently eating larger portions than normal , binge eating behaviors, struggling with emotional eating and having problems with excessive hunger.   During today's appointment, Zahriya reported she has "very little self-control" in different areas (i.e., eating and spending) of her life. She was verbally administered a questionnaire assessing various behaviors related to emotional eating. Shalita endorsed the following: overeat when you are celebrating, experience food cravings on a regular basis, eat certain foods when you are anxious, stressed, depressed, or your feelings are hurt, use food to help you cope with emotional situations, find food is comforting to you, overeat when you are angry or upset, overeat when you are worried about something,  overeat frequently when you are bored or lonely, overeat when you are alone, but eat much less when you are with other people and eat as a reward. Olive believes the onset of emotional eating  was likely "late elementary school, middle school" and described the current frequency of emotional eating as "at least four times a week." In addition, Menucha denied a history of binge eating. However, she acknowledged eating at night. Coriana denied a history of restricting food intake, purging and engagement in other compensatory strategies, and has never been diagnosed with an eating disorder. She also denied a history of treatment for emotional eating.  Moreover, Kambra indicated viewing food as a reward and stress triggers emotional eating. She stated she is unsure what makes emotional eating better. Furthermore, Javaria reported she is being treated for depression, noting it is "well managed."   Mental Status Examination:  Appearance: well groomed and appropriate hygiene  Behavior: appropriate to circumstances Mood: euthymic Affect: mood congruent Speech: normal in rate, volume, and tone Eye Contact: appropriate Psychomotor Activity: appropriate Gait: unable to assess Thought Process: linear, logical, and goal directed  Thought Content/Perception: denies suicidal and homicidal ideation, plan, and intent and no hallucinations, delusions, bizarre thinking or behavior reported or observed Orientation: time, person, place and purpose of appointment Memory/Concentration: memory, attention, language, and fund of knowledge intact  Insight/Judgment: good  Family & Psychosocial History: Ryonna reported she is married and she has two children (ages 42 and 23). 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 Additionally, Skyah shared her highest level of education obtained is a BSN degree, adding she is currently enrolled part-time with [employer] for their  clinic nurse specialist program. Currently, Aalijah's social support system consists of her friends and mother. Moreover, Noele stated she resides with her mother, children, and husband.   Medical History:  Past Medical History:  Diagnosis Date  . Depression    treated  . PCOS (polycystic ovarian syndrome) 2008  . Plantar fasciitis    Past Surgical History:  Procedure Laterality Date  . CHOLECYSTECTOMY     Current Outpatient Medications on File Prior to Visit  Medication Sig Dispense Refill  . buPROPion (WELLBUTRIN XL) 300 MG 24 hr tablet Take 1 tablet (300 mg total) by mouth daily. 90 tablet 0  . busPIRone (BUSPAR) 10 MG tablet Take 2 tablets (20 mg total) by mouth daily with breakfast. 180 tablet 0  . clindamycin (CLEOCIN T) 1 % lotion     . clindamycin (CLEOCIN T) 1 % lotion Apply topically.    . cyclobenzaprine (FLEXERIL) 5 MG tablet Take 5 mg by mouth at bedtime as needed.    . cyclobenzaprine (FLEXERIL) 5 MG tablet Take by mouth.    . ELDERBERRY PO Take by mouth.    . fluconazole (DIFLUCAN) 150 MG tablet     . levonorgestrel (MIRENA) 20 MCG/24HR IUD 1 each by Intrauterine route once.    . Melatonin 5 MG CHEW Chew 5 mg by mouth at bedtime.    . meloxicam (MOBIC) 15 MG tablet Take by mouth.    . metFORMIN (GLUCOPHAGE) 500 MG tablet Take 1 tablet (500 mg total) by mouth daily with breakfast. 30 tablet 0  . Multiple Vitamin (MULTIVITAMIN) tablet Take 1 tablet by mouth daily.    Marland Kitchen spironolactone (ALDACTONE) 100 MG tablet Take by mouth.    . Vitamin D, Ergocalciferol, (DRISDOL) 1.25 MG (50000 UNIT) CAPS capsule Take 1 capsule (50,000 Units total) by mouth every 3 (three) days. 10 capsule 0   No current facility-administered medications on file prior to visit.  Caramia denied a history of head injuries and loss of consciousness.    Mental Health  History: Kianny reported she attended therapeutic around age 40 after a "cry for help." More specifically, she stated she took Tylenol  and told someone about it resulting in her stomach having to be pumped. She noted that was the first and last time she experienced suicidal ideation. The following protective factors were identified for Zeplynn: "life in general" and children. If she were to become overwhelmed in the future, which is a sign that a crisis may occur, she identified the following coping skills she could engage in: treat self, talk to friends, and go to church. It was recommended the aforementioned be written down and developed into a coping card for future reference; she was observed writing. Psychoeducation regarding the importance of reaching out to a trusted individual and/or utilizing emergency resources if there is a change in emotional status and/or there is an inability to ensure safety was provided. Zannie's confidence in reaching out to a trusted individual and/or utilizing emergency resources should there be an intensification in emotional status and/or there is an inability to ensure safety was assessed on a scale of one to ten where one is not confident and ten is extremely confident. She reported her confidence is a 10. Additionally, Shayde endorsed current access to firearms and/or weapons. She shared her family owns two handguns, rifle, and shot gun, noting they are locked. She was agreeable to having them moved should there ever be safety concerns. Moreover, Lourie shared she later attended therapy in her early 34s to address symptoms of depression. During her early 48s, Azusena recalled also meeting with a psychiatrist, noting her psychotropic medications prior to that were managed by her PCP. Devona shared she began meeting with her current psychiatrist, Ursula Alert, M.D. approximately one year ago for medication management. They meet every three months and their next appointment is Dec 31, 2019. Currently, Dr. Shea Evans prescribes Buspar and Wellbutrin, which Keishla described as helpful. When she initiated  services with Dr. Armida Sans recalled meeting with a therapist for one appointment. Lloyd reported there is no history of hospitalizations for psychiatric concerns. Evalyse endorsed a family history of mental health related concerns. She reported a history of depression (father, paternal grandmother, sister), alcoholism (father), addiction (paternal grandmother and sister), and bipolar disorder (maternal aunt). Aaren reported there is no history of trauma including psychological, physical  and sexual abuse, as well as neglect.   Jazman described her typical mood lately as "stressed." Aside from concerns noted above and endorsed on the PHQ-9 and GAD-7, Brittane reported experiencing crying spells; decreased motivation; and worry thoughts about school, managing work-life balance, and well-being of her friend. Jesselyn endorsed current alcohol use. She noted consuming "a shot or two of liquor" approximately once a week. She denied tobacco use. She denied illicit/recreational substance use. Regarding caffeine intake, Nazyia reported consuming 1-2 cups of coffee daily, 3-4 diet sodas daily, and occasionally a energy drink. Furthermore, Dashiya indicated she is not experiencing the following: hallucinations and delusions, paranoia, symptoms of mania (e.g., expansive mood, flighty ideas, decreased need for sleep, engagement in risky behaviors), social withdrawal and panic attacks. She also denied current suicidal ideation, plan, and intent; history of and current homicidal ideation, plan, and intent; and history of and current engagement in self-harm.  The following strength was reported by [name]: caring. The following strengths were observed by this provider: ability to express thoughts and feelings during the therapeutic session, ability to establish and benefit from a therapeutic relationship, willingness to work toward established goal(s) with the clinic  and ability to engage in reciprocal  conversation.  Legal History: Antanisha reported there is no history of legal involvement.   Structured Assessments Results: The Patient Health Questionnaire-9 (PHQ-9) is a self-report measure that assesses symptoms and severity of depression over the course of the last two weeks. Janhavi obtained a score of 8 suggesting mild depression. Clelia finds the endorsed symptoms to be somewhat difficult. [0= Not at all; 1= Several days; 2= More than half the days; 3= Nearly every day] Little interest or pleasure in doing things 0  Feeling down, depressed, or hopeless 0  Trouble falling or staying asleep, or sleeping too much 0  Feeling tired or having little energy 2  Poor appetite or overeating 1  Feeling bad about yourself --- or that you are a failure or have let yourself or your family down 3  Trouble concentrating on things, such as reading the newspaper or watching television 2  Moving or speaking so slowly that other people could have noticed? Or the opposite --- being so fidgety or restless that you have been moving around a lot more than usual 0  Thoughts that you would be better off dead or hurting yourself in some way 0  PHQ-9 Score 8    The Generalized Anxiety Disorder-7 (GAD-7) is a brief self-report measure that assesses symptoms of anxiety over the course of the last two weeks. Coni obtained a score of 2 suggesting minimal anxiety. Aviannah finds the endorsed symptoms to be not difficult at all. [0= Not at all; 1= Several days; 2= Over half the days; 3= Nearly every day] Feeling nervous, anxious, on edge 0  Not being able to stop or control worrying 0  Worrying too much about different things 1  Trouble relaxing 0  Being so restless that it's hard to sit still 0  Becoming easily annoyed or irritable 1  Feeling afraid as if something awful might happen 0  GAD-7 Score 2   Interventions:  Conducted a chart review Focused on rapport building Verbally administered PHQ-9 and GAD-7  for symptom monitoring Verbally administered Food & Mood questionnaire to assess various behaviors related to emotional eating. Provided emphatic reflections and validation Collaborated with patient on a treatment goal  Psychoeducation provided regarding physical versus emotional hunger Conducted a risk assessment Developed a coping card  Provisional DSM-5 Diagnosis: 311 (F32.8) Other Specified Depressive Disorder, Emotional Eating Behaviors  Plan: Maeleigh appears able and willing to participate as evidenced by collaboration on a treatment goal, engagement in reciprocal conversation, and asking questions as needed for clarification. The next appointment will be scheduled in approximately two weeks, which will be via [employer]. The following treatment goal was established: increase coping skills. This provider will regularly review the treatment plan and medical chart to keep informed of status changes. Carmyn expressed understanding and agreement with the initial treatment plan of care. Tameyah will be sent a handout via e-mail to utilize between now and the next appointment to increase awareness of hunger patterns and subsequent eating. Nadalyn provided verbal consent during today's appointment for this provider to send the handout via e-mail.

## 2019-10-06 NOTE — Progress Notes (Signed)
TeleHealth Visit:  Due to the COVID-19 pandemic, this visit was completed with telemedicine (audio/video) technology to reduce patient and provider exposure as well as to preserve personal protective equipment.   CORRY FULWILER has verbally consented to this TeleHealth visit. The patient is located in the car at home, the provider is located at the [facility] office. The participants in this visit include the listed provider and patient. The visit was conducted today via FaceTime.  Chief Complaint: OBESITY [place] is here to discuss her progress with her obesity treatment plan along with follow-up of her obesity related diagnoses. Kippi is on the Category 2 Plan and states she is following her eating plan approximately 50% of the time. Aurellia states she is exercising 0 minutes 0 times per week.  Today's visit was #: 24 Starting weight: 273 lbs Starting date: 01/09/2018  Interim History: Vidushi is frustrated with herself and her inability to follow the plan consistently. She is under some stress at home and has been emotionally eating.  Subjective:   Vitamin D deficiency. Jireh is on prescription Vitamin D weekly. No nausea, vomiting, or muscle weakness. Last level was not at goal (34.4 on 09/21/2019).  Assessment/Plan:   Vitamin D deficiency. Low Vitamin D level contributes to fatigue and are associated with obesity, breast, and colon cancer. She will increase Vitamin D to twice weekly and was given a prescription for Vitamin D, Ergocalciferol, (DRISDOL) 1.25 MG (50000 UNIT) CAPS capsule every 3 days #10 and will follow-up for routine testing of Vitamin D, at least 2-3 times per year to avoid over-replacement.   Class 2 severe obesity with serious comorbidity and body mass index (BMI) of 36.0 to 36.9 in adult, unspecified obesity type ([place]).  Andrianne is currently in the action stage of change. As such, her goal is to continue with weight loss efforts. She has  agreed to the Category 2 Plan.   Exercise goals: For substantial health benefits, adults should do at least 150 minutes (2 hours and 30 minutes) a week of moderate-intensity, or 75 minutes (1 hour and 15 minutes) a week of vigorous-intensity aerobic physical activity, or an equivalent combination of moderate- and vigorous-intensity aerobic activity. Aerobic activity should be performed in episodes of at least 10 minutes, and preferably, it should be spread throughout the week.  Behavioral modification strategies: decreasing eating out and emotional eating strategies.  Aurelia has agreed to follow-up with our clinic in 2 weeks. She was informed of the importance of frequent follow-up visits to maximize her success with intensive lifestyle modifications for her multiple health conditions.  Objective:   VITALS: Per patient if applicable, see vitals. GENERAL: Alert and in no acute distress. CARDIOPULMONARY: No increased WOB. Speaking in clear sentences.  PSYCH: Pleasant and cooperative. Speech normal rate and rhythm. Affect is appropriate. Insight and judgement are appropriate. Attention is focused, linear, and appropriate.  NEURO: Oriented as arrived to appointment on time with no prompting.   Lab Results  Component Value Date   CREATININE 0.89 09/21/2019   BUN 9 09/21/2019   NA 140 09/21/2019   K 4.5 09/21/2019   CL 103 09/21/2019   CO2 20 09/21/2019   Lab Results  Component Value Date   ALT 47 (H) 09/21/2019   AST 29 09/21/2019   ALKPHOS 70 09/21/2019   BILITOT 0.3 09/21/2019   Lab Results  Component Value Date   HGBA1C 5.2 09/21/2019   HGBA1C 5.1 04/20/2019   HGBA1C 5.2 10/09/2018  HGBA1C 5.2 01/09/2018   Lab Results  Component Value Date   INSULIN 20.3 09/21/2019   INSULIN 13.8 04/20/2019   INSULIN 13.0 10/09/2018   INSULIN 16.5 01/09/2018   Lab Results  Component Value Date   TSH 1.470 01/09/2018   Lab Results  Component Value Date   CHOL 153 01/09/2018   HDL  43 01/09/2018   LDLCALC 97 01/09/2018   TRIG 63 01/09/2018   Lab Results  Component Value Date   WBC 4.7 01/09/2018   HGB 14.5 01/09/2018   HCT 42.1 01/09/2018   MCV 89 01/09/2018   PLT 195 09/20/2016   No results found for: IRON, TIBC, FERRITIN  Attestation Statements:   Reviewed by clinician on day of visit: allergies, medications, problem list, medical history, surgical history, family history, social history, and previous encounter notes.  IMichaelene Song, am acting as transcriptionist for Abby Potash, PA-C   I have reviewed the above documentation for accuracy and completeness, and I agree with the above. Abby Potash, PA-C

## 2019-10-15 DIAGNOSIS — R200 Anesthesia of skin: Secondary | ICD-10-CM | POA: Insufficient documentation

## 2019-10-19 ENCOUNTER — Telehealth (INDEPENDENT_AMBULATORY_CARE_PROVIDER_SITE_OTHER): Payer: No Typology Code available for payment source | Admitting: Physician Assistant

## 2019-10-20 ENCOUNTER — Telehealth (INDEPENDENT_AMBULATORY_CARE_PROVIDER_SITE_OTHER): Payer: No Typology Code available for payment source | Admitting: Physician Assistant

## 2019-10-20 ENCOUNTER — Ambulatory Visit (INDEPENDENT_AMBULATORY_CARE_PROVIDER_SITE_OTHER): Payer: No Typology Code available for payment source | Admitting: Psychology

## 2019-10-20 ENCOUNTER — Other Ambulatory Visit: Payer: Self-pay

## 2019-10-20 ENCOUNTER — Encounter (INDEPENDENT_AMBULATORY_CARE_PROVIDER_SITE_OTHER): Payer: Self-pay | Admitting: Physician Assistant

## 2019-10-20 DIAGNOSIS — F3289 Other specified depressive episodes: Secondary | ICD-10-CM

## 2019-10-20 DIAGNOSIS — E8881 Metabolic syndrome: Secondary | ICD-10-CM

## 2019-10-20 DIAGNOSIS — Z6836 Body mass index (BMI) 36.0-36.9, adult: Secondary | ICD-10-CM

## 2019-10-20 DIAGNOSIS — E559 Vitamin D deficiency, unspecified: Secondary | ICD-10-CM | POA: Diagnosis not present

## 2019-10-20 MED ORDER — METFORMIN HCL 500 MG PO TABS: 500.0000 mg | ORAL_TABLET | Freq: Every day | ORAL | 0 refills | Status: DC

## 2019-10-20 NOTE — Progress Notes (Signed)
TeleHealth Visit:  Due to the COVID-19 pandemic, this visit was completed with telemedicine (audio/video) technology to reduce patient and provider exposure as well as to preserve personal protective equipment.   Rhonda [name] has verbally consented to this TeleHealth visit. The patient is located at home, the provider is located at the [facility] office. The participants in this visit include the listed provider and patient. The visit was conducted today via Face Time.  Chief Complaint: OBESITY Rhonda [name] is here to discuss her progress with her obesity treatment plan along with follow-up of her obesity related diagnoses. Rhonda [name] is on the Category 2 Plan and states she is following her eating plan approximately 60% of the time. Rhonda [name] states she is exercising for 0 minutes 0 times per week.  Today's visit was #: 25 Starting weight: 273 lbs Starting date: 01/09/2018  Interim History: Rhonda [name] has done a better job with eating out less and packing her lunch more.  She wants to start walking some during the week.  Subjective:   1. Insulin resistance Rhonda [name] has a diagnosis of insulin resistance based on her elevated fasting insulin level >5. She continues to work on diet and exercise to decrease her risk of diabetes.  She is taking metformin 500 mg daily.  Denies polyphagia.  Lab Results  Component Value Date   INSULIN 20.3 09/21/2019   INSULIN 13.8 04/20/2019   INSULIN 13.0 10/09/2018   INSULIN 16.5 01/09/2018   Lab Results  Component Value Date   HGBA1C 5.2 09/21/2019   2. Vitamin D deficiency Rhonda [name]'s Vitamin D level was 34.4 on 09/21/2019. She is currently taking vit D. She denies nausea, vomiting or muscle weakness.  Assessment/Plan:   1. Insulin resistance Rhonda [name] will continue to work on weight loss, exercise, and decreasing simple carbohydrates to help decrease the risk of diabetes. Rhonda [name] agreed to follow-up with [name] as directed to closely monitor her  progress. - metFORMIN (GLUCOPHAGE) 500 MG tablet; Take 1 tablet (500 mg total) by mouth daily with breakfast.  Dispense: 30 tablet; Refill: 0  2. Vitamin D deficiency Low Vitamin D level contributes to fatigue and are associated with obesity, breast, and colon cancer. She agrees to continue to take prescription Vitamin D @50 ,000 IU twice weekly and will follow-up for routine testing of Vitamin D, at least 2-3 times per year to avoid over-replacement.  3. Class 2 severe obesity with serious comorbidity and body mass index (BMI) of 36.0 to 36.9 in adult, unspecified obesity type [facility] [address]) Rhonda [name] is currently in the action stage of change. As such, her goal is to continue with weight loss efforts. She has agreed to the Category 2 Plan.   Exercise goals: For substantial health benefits, adults should do at least 150 minutes (2 hours and 30 minutes) a week of moderate-intensity, or 75 minutes (1 hour and 15 minutes) a week of vigorous-intensity aerobic physical activity, or an equivalent combination of moderate- and vigorous-intensity aerobic activity. Aerobic activity should be performed in episodes of at least 10 minutes, and preferably, it should be spread throughout the week.  Behavioral modification strategies: meal planning and cooking strategies and keeping healthy foods in the home.  Rhonda [name] has agreed to follow-up with our clinic in 3 weeks. She was informed of the importance of frequent follow-up visits to maximize her success with intensive lifestyle modifications for her multiple health conditions.  Objective:   VITALS: Per patient if applicable, see vitals. GENERAL: Alert and in no acute distress. CARDIOPULMONARY: No increased WOB.  Speaking in clear sentences.  PSYCH: Pleasant and cooperative. Speech normal rate and rhythm. Affect is appropriate. Insight and judgement are appropriate. Attention is focused, linear, and appropriate.  NEURO: Oriented as arrived to appointment on time with no  prompting.   Lab Results  Component Value Date   CREATININE 0.89 09/21/2019   BUN 9 09/21/2019   NA 140 09/21/2019   K 4.5 09/21/2019   CL 103 09/21/2019   CO2 20 09/21/2019   Lab Results  Component Value Date   ALT 47 (H) 09/21/2019   AST 29 09/21/2019   ALKPHOS 70 09/21/2019   BILITOT 0.3 09/21/2019   Lab Results  Component Value Date   HGBA1C 5.2 09/21/2019   HGBA1C 5.1 04/20/2019   HGBA1C 5.2 10/09/2018   HGBA1C 5.2 01/09/2018   Lab Results  Component Value Date   INSULIN 20.3 09/21/2019   INSULIN 13.8 04/20/2019   INSULIN 13.0 10/09/2018   INSULIN 16.5 01/09/2018   Lab Results  Component Value Date   TSH 1.470 01/09/2018   Lab Results  Component Value Date   CHOL 153 01/09/2018   HDL 43 01/09/2018   LDLCALC 97 01/09/2018   TRIG 63 01/09/2018   Lab Results  Component Value Date   WBC 4.7 01/09/2018   HGB 14.5 01/09/2018   HCT 42.1 01/09/2018   MCV 89 01/09/2018   PLT 195 09/20/2016   Attestation Statements:   Reviewed by clinician on day of visit: allergies, medications, problem list, medical history, surgical history, family history, social history, and previous encounter notes.  I, Water quality scientist, CMA, am acting as Location manager for [employer], PA-C.  I have reviewed the above documentation for accuracy and completeness, and I agree with the above. Abby Potash, PA-C

## 2019-10-22 NOTE — Progress Notes (Signed)
  Office: [phone]  /  Fax: [phone]    Date: November 05, 2019   Appointment Start Time: 2:07pm Duration: 25 minutes Provider: Glennie Isle, Psy.D. Type of Session: Individual Therapy  Location of Patient: Parked in car Location of Provider: Provider's Home Type of Contact: Telepsychological Visit via [employer] Web[employer]  Session Content:This provider called Rhonda [name] at 2:02pm as she did not present for the WebEx appointment. The e-mail with the secure link was re-sent. As such, today's appointment was initiated 7 minutes late. Rhonda [name] is a 34 y.o. female presenting via [place] for a follow-up appointment to address the previously established treatment goal of increasing coping skills. Today's appointment was a telepsychological visit due to COVID-19. Rhonda [name] provided verbal consent for today's telepsychological appointment and she is aware she is responsible for securing confidentiality on her end of the session. Prior to proceeding with today's appointment, Rhonda [name]'s physical location at the time of this appointment was obtained as well a phone number she could be reached at in the event of technical difficulties. Rhonda [name] and this provider participated in today's telepsychological service.   This provider conducted a brief check-in. Rhonda [name] shared about recent events, including recent weight loss and success with the meal plan. Emotional and physical hunger. Psychoeducation regarding all or nothing thinking was provided. Moreover, psychoeducation regarding triggers for emotional eating was provided. Rhonda [name] was provided a handout, and encouraged to utilize the handout between now and the next appointment to increase awareness of triggers and frequency. Rhonda [name] agreed. This provider also discussed behavioral strategies for specific triggers, such as placing the utensil down when conversing to avoid mindless eating. Rhonda [name] provided verbal consent during today's appointment for this provider to send  a handout about triggers via e-mail. Rhonda [name] was receptive to today's appointment as evidenced by openness to sharing, responsiveness to feedback, and willingness to explore triggers for emotional eating.  Mental Status Examination:  Appearance: well groomed and appropriate hygiene  Behavior: appropriate to circumstances Mood: euthymic Affect: mood congruent Speech: normal in rate, volume, and tone Eye Contact: appropriate Psychomotor Activity: appropriate Gait: unable to assess Thought Process: linear, logical, and goal directed  Thought Content/Perception: no hallucinations, delusions, bizarre thinking or behavior reported or observed and no evidence of suicidal and homicidal ideation, plan, and intent Orientation: time, person, place and purpose of appointment Memory/Concentration: memory, attention, language, and fund of knowledge intact  Insight/Judgment: good  Interventions:  Conducted a brief chart review Provided empathic reflections and validation Reviewed content from the previous session Employed supportive psychotherapy interventions to facilitate reduced distress and to improve coping skills with identified stressors Employed motivational interviewing skills to assess patient's willingness/desire to adhere to recommended medical treatments and assignments Psychoeducation provided regarding triggers for emotional eating  Psychoeducation provided regarding all or nothing thinking  DSM-5 Diagnosis(es): 311 (F32.8) Other Specified Depressive Disorder, Emotional Eating Behaviors  Treatment Goal & Progress: During the initial appointment with this provider, the following treatment goal was established: increase coping skills. Progress is limited, as Rhonda [name] has just begun treatment with this provider; however, she is receptive to the interaction and interventions and rapport is being established.   Plan: The next appointment will be scheduled in two weeks, which will be via [employer]. The next session will focus on working towards the established treatment goal.

## 2019-11-05 ENCOUNTER — Other Ambulatory Visit: Payer: Self-pay

## 2019-11-05 ENCOUNTER — Encounter (INDEPENDENT_AMBULATORY_CARE_PROVIDER_SITE_OTHER): Payer: Self-pay | Admitting: Physician Assistant

## 2019-11-05 ENCOUNTER — Ambulatory Visit (INDEPENDENT_AMBULATORY_CARE_PROVIDER_SITE_OTHER): Payer: No Typology Code available for payment source | Admitting: Psychology

## 2019-11-05 ENCOUNTER — Telehealth (INDEPENDENT_AMBULATORY_CARE_PROVIDER_SITE_OTHER): Payer: No Typology Code available for payment source | Admitting: Physician Assistant

## 2019-11-05 DIAGNOSIS — E559 Vitamin D deficiency, unspecified: Secondary | ICD-10-CM | POA: Diagnosis not present

## 2019-11-05 DIAGNOSIS — F3289 Other specified depressive episodes: Secondary | ICD-10-CM

## 2019-11-05 DIAGNOSIS — Z6836 Body mass index (BMI) 36.0-36.9, adult: Secondary | ICD-10-CM

## 2019-11-05 MED ORDER — VITAMIN D (ERGOCALCIFEROL) 1.25 MG (50000 UNIT) PO CAPS: 50000.0000 [IU] | ORAL_CAPSULE | ORAL | 0 refills | Status: DC

## 2019-11-05 NOTE — Progress Notes (Signed)
Office: [phone]  /  Fax: [phone]    Date: November 19, 2019   Appointment Start Time: 1:59pm Duration: 25 minutes Provider: Glennie Isle, Psy.D. Type of Session: Individual Therapy  Location of Patient: Parked in car Location of Provider: Provider's Home Type of Contact: Telepsychological Visit via [employer] WebEx  Session Content: Rhonda [name] is a 34 y.o. female presenting via [place] for a follow-up appointment to address the previously established treatment goal of increasing coping skills. Today's appointment was a telepsychological visit due to COVID-19. Rhonda [name] provided verbal consent for today's telepsychological appointment and she is aware she is responsible for securing confidentiality on her end of the session. Prior to proceeding with today's appointment, Rhonda [name]'s physical location at the time of this appointment was obtained as well a phone number she could be reached at in the event of technical difficulties. Rhonda [name] and this provider participated in today's telepsychological service.   This provider conducted a brief check-in. Rhonda [name] reported she is now getting smaller beverages at [facility]. Positive reinforcement was provided. Regarding eating,  Rhonda [name] acknowledged she deviates for lunch and dinner from the meal plan. Psychoeducation regarding SMART goals was provided and Rhonda [name] was engaged in goal setting. The following goal was established for lunch: [name] will eat lunch congruent to her meal plan at least 5 out of 7 days a week between now and next appointment. The following goal was established for dinner: Rhonda [name] will eat dinner congruent to her meal plan at least 5 out of 7 days a week between now and next appointment. Rhonda [name] was receptive as evidenced by her stating, "I like that." Moreover, triggers for emotional eating were reviewed. Furthermore, psychoeducation regarding mindfulness was provided. A handout was provided to Rhonda [name][place] with further information  regarding mindfulness, including exercises. This provider also explained the benefit of mindfulness as it relates to emotional eating. Rhonda [name] was encouraged to engage in the provided exercises between now and the next appointment with this provider. Rhonda [name] agreed. During today's appointment, Rhonda [name] was led through a mindfulness exercise involving her senses. Rhonda [name] provided verbal consent during today's appointment for this provider to send a handout about mindfulness via e-mail. Rhonda [name] was receptive to today's appointment as evidenced by openness to sharing, responsiveness to feedback, and willingness to engage in mindfulness exercises to assist with coping.  Mental Status Examination:  Appearance: well groomed and appropriate hygiene  Behavior: appropriate to circumstances Mood: euthymic Affect: mood congruent Speech: normal in rate, volume, and tone Eye Contact: appropriate Psychomotor Activity: appropriate Gait: unable to assess Thought Process: linear, logical, and goal directed  Thought Content/Perception: no hallucinations, delusions, bizarre thinking or behavior reported or observed and no evidence of suicidal and homicidal ideation, plan, and intent Orientation: time, person, place and purpose of appointment Memory/Concentration: memory, attention, language, and fund of knowledge intact  Insight/Judgment: good  Interventions:  Conducted a brief chart review Provided empathic reflections and validation Reviewed content from the previous session Employed supportive psychotherapy interventions to facilitate reduced distress and to improve coping skills with identified stressors Employed motivational interviewing skills to assess patient's willingness/desire to adhere to recommended medical treatments and assignments Engaged patient in goal setting Psychoeducation provided regarding mindfulness Engaged patient in mindfulness exercise(s) Employed acceptance and commitment  interventions to emphasize mindfulness and acceptance without struggle Psychoeducation provided regarding SMART goals  DSM-5 Diagnosis(es): 311 (F32.8) Other Specified Depressive Disorder, Emotional Eating Behaviors  Treatment Goal & Progress: During the initial appointment with this provider, the following treatment goal was established: increase coping skills. Rhonda [name]  has demonstrated progress in her goal as evidenced by increased awareness of hunger patterns and increased awareness of triggers for emotional eating. Rhonda [name] also demonstrates willingness to engage in mindfulness exercises.  Plan: Per Corina's request due to her work schedule, the next appointment will be scheduled in approximately one month, which will be via [place] Visit. The next session will focus on working towards the established treatment goal.

## 2019-11-05 NOTE — Progress Notes (Signed)
TeleHealth Visit:  Due to the COVID-19 pandemic, this visit was completed with telemedicine (audio/video) technology to reduce patient and provider exposure as well as to preserve personal protective equipment.   Rhonda [name] has verbally consented to this TeleHealth visit. The patient is located at work, the provider is located at the [facility] office. The participants in this visit include the listed provider and patient. The visit was conducted today via Face Time.  Chief Complaint: OBESITY Rhonda [name] is here to discuss her progress with her obesity treatment plan along with follow-up of her obesity related diagnoses. Rhonda [name] is on the Category 2 Plan and states she is following her eating plan approximately 75% of the time. Rhonda [name] states she is exercising for 0 minutes 0 times per week.  Today's visit was #: 26 Starting weight: 273 lbs Starting date: 01/09/2018  Interim History: Rhonda [name]'s most recent weight is 263 pounds.  She reports that she has been eating out less and following the plan more often.  Subjective:   1. Vitamin D deficiency Rhonda [name]'s Vitamin D level was 34.4 on 09/21/2019. She is currently taking vit D twice weekly. She denies nausea, vomiting or muscle weakness.  Assessment/Plan:   1. Vitamin D deficiency Low Vitamin D level contributes to fatigue and are associated with obesity, breast, and colon cancer. She agrees to continue to take prescription Vitamin D @50 ,000 IU every week and will follow-up for routine testing of Vitamin D, at least 2-3 times per year to avoid over-replacement. - Vitamin D, Ergocalciferol, (DRISDOL) 1.25 MG (50000 UNIT) CAPS capsule; Take 1 capsule (50,000 Units total) by mouth every 3 (three) days.  Dispense: 10 capsule; Refill: 0  2. Class 2 severe obesity with serious comorbidity and body mass index (BMI) of 36.0 to 36.9 in adult, unspecified obesity type Rhonda [name]) Rhonda [name] is currently in the action stage of change. As such, her  goal is to continue with weight loss efforts. She has agreed to the Category 2 Plan.   Exercise goals: For substantial health benefits, adults should do at least 150 minutes (2 hours and 30 minutes) a week of moderate-intensity, or 75 minutes (1 hour and 15 minutes) a week of vigorous-intensity aerobic physical activity, or an equivalent combination of moderate- and vigorous-intensity aerobic activity. Aerobic activity should be performed in episodes of at least 10 minutes, and preferably, it should be spread throughout the week.  Behavioral modification strategies: meal planning and cooking strategies and keeping healthy foods in the home.  Rhonda [name] has agreed to follow-up with our clinic in 2 weeks. She was informed of the importance of frequent follow-up visits to maximize her success with intensive lifestyle modifications for her multiple health conditions.  Objective:   VITALS: Per patient if applicable, see vitals. GENERAL: Alert and in no acute distress. CARDIOPULMONARY: No increased WOB. Speaking in clear sentences.  PSYCH: Pleasant and cooperative. Speech normal rate and rhythm. Affect is appropriate. Insight and judgement are appropriate. Attention is focused, linear, and appropriate.  NEURO: Oriented as arrived to appointment on time with no prompting.   Lab Results  Component Value Date   CREATININE 0.89 09/21/2019   BUN 9 09/21/2019   NA 140 09/21/2019   K 4.5 09/21/2019   CL 103 09/21/2019   CO2 20 09/21/2019   Lab Results  Component Value Date   ALT 47 (H) 09/21/2019   AST 29 09/21/2019   ALKPHOS 70 09/21/2019   BILITOT 0.3 09/21/2019   Lab Results  Component Value Date  HGBA1C 5.2 09/21/2019   HGBA1C 5.1 04/20/2019   HGBA1C 5.2 10/09/2018   HGBA1C 5.2 01/09/2018   Lab Results  Component Value Date   INSULIN 20.3 09/21/2019   INSULIN 13.8 04/20/2019   INSULIN 13.0 10/09/2018   INSULIN 16.5 01/09/2018   Lab Results  Component Value Date   TSH 1.470  01/09/2018   Lab Results  Component Value Date   CHOL 153 01/09/2018   HDL 43 01/09/2018   LDLCALC 97 01/09/2018   TRIG 63 01/09/2018   Lab Results  Component Value Date   WBC 4.7 01/09/2018   HGB 14.5 01/09/2018   HCT 42.1 01/09/2018   MCV 89 01/09/2018   PLT 195 09/20/2016   Attestation Statements:   Reviewed by clinician on day of visit: allergies, medications, problem list, medical history, surgical history, family history, social history, and previous encounter notes.  I, Water quality scientist, CMA, am acting as Location manager for [employer], PA-C.  I have reviewed the above documentation for accuracy and completeness, and I agree with the above. Abby Potash, PA-C

## 2019-11-09 ENCOUNTER — Telehealth: Payer: No Typology Code available for payment source | Admitting: Nurse Practitioner

## 2019-11-09 DIAGNOSIS — B373 Candidiasis of vulva and vagina: Secondary | ICD-10-CM | POA: Diagnosis not present

## 2019-11-09 DIAGNOSIS — B3731 Acute candidiasis of vulva and vagina: Secondary | ICD-10-CM

## 2019-11-09 MED ORDER — FLUCONAZOLE 150 MG PO TABS: 150.0000 mg | ORAL_TABLET | Freq: Once | ORAL | 0 refills | Status: AC

## 2019-11-09 NOTE — Progress Notes (Signed)

## 2019-11-18 ENCOUNTER — Encounter (INDEPENDENT_AMBULATORY_CARE_PROVIDER_SITE_OTHER): Payer: Self-pay | Admitting: Physician Assistant

## 2019-11-19 ENCOUNTER — Other Ambulatory Visit: Payer: Self-pay

## 2019-11-19 ENCOUNTER — Encounter (INDEPENDENT_AMBULATORY_CARE_PROVIDER_SITE_OTHER): Payer: Self-pay | Admitting: Physician Assistant

## 2019-11-19 ENCOUNTER — Ambulatory Visit (INDEPENDENT_AMBULATORY_CARE_PROVIDER_SITE_OTHER): Payer: No Typology Code available for payment source | Admitting: Psychology

## 2019-11-19 ENCOUNTER — Ambulatory Visit (INDEPENDENT_AMBULATORY_CARE_PROVIDER_SITE_OTHER): Payer: No Typology Code available for payment source | Admitting: Physician Assistant

## 2019-11-19 VITALS — BP 116/79 | HR 76 | Temp 97.9°F | Ht 70.0 in | Wt 259.0 lb

## 2019-11-19 DIAGNOSIS — Z9189 Other specified personal risk factors, not elsewhere classified: Secondary | ICD-10-CM

## 2019-11-19 DIAGNOSIS — F3289 Other specified depressive episodes: Secondary | ICD-10-CM

## 2019-11-19 DIAGNOSIS — E8881 Metabolic syndrome: Secondary | ICD-10-CM

## 2019-11-19 DIAGNOSIS — E559 Vitamin D deficiency, unspecified: Secondary | ICD-10-CM

## 2019-11-19 DIAGNOSIS — Z6837 Body mass index (BMI) 37.0-37.9, adult: Secondary | ICD-10-CM

## 2019-11-19 MED ORDER — METFORMIN HCL 500 MG PO TABS: 500.0000 mg | ORAL_TABLET | Freq: Every day | ORAL | 0 refills | Status: DC

## 2019-11-23 NOTE — Progress Notes (Signed)
Chief Complaint:   OBESITY Rhonda [name] is here to discuss her progress with her obesity treatment plan along with follow-up of her obesity related diagnoses. Rhonda [name] is on the Category 2 Plan and states she is following her eating plan approximately 75% of the time. Rhonda [name] states she is walking for 30 minutes 1 time per week.  Today's visit was #: 37 Starting weight: 273 lbs Starting date: 01/09/2018 Today's weight: 259 lbs Today's date: 11/19/2019 Total lbs lost to date: 14 Total lbs lost since last in-office visit: 0  Interim History: Rhonda [name] is down 4 lbs from her last telehealth visit. She is doing a better job with portion controlling on the days she is eating off the plan. She has a girl's trip coming up.  Subjective:   1. Insulin resistance Rhonda [name] denies nausea, vomiting, or diarrhea on metformin. She denies polyphagia.  2. Vitamin D deficiency Rhonda [name] denies nausea, vomiting, or muscle weakness on Vit D. She is walking outside once a week.  3. At risk for diabetes mellitus Rhonda [name] is at higher than average risk for developing diabetes due to her obesity.   Assessment/Plan:   1. Insulin resistance Rhonda [name] will continue to work on weight loss, exercise, and decreasing simple carbohydrates to help decrease the risk of diabetes. We will refill metformin for 1 month. Rhonda [name] agreed to follow-up with [name] as directed to closely monitor her progress.  - metFORMIN (GLUCOPHAGE) 500 MG tablet; Take 1 tablet (500 mg total) by mouth daily with breakfast.  Dispense: 30 tablet; Refill: 0  2. Vitamin D deficiency Low Vitamin D level contributes to fatigue and are associated with obesity, breast, and colon cancer. Rhonda [name] agreed to continue taking prescription Vitamin D 50,000 IU every 3 days and will follow-up for routine testing of Vitamin D, at least 2-3 times per year to avoid over-replacement.  3. At risk for diabetes mellitus Rhonda [name] was given approximately 15 minutes of diabetes  education and counseling today. We discussed intensive lifestyle modifications today with an emphasis on weight loss as well as increasing exercise and decreasing simple carbohydrates in her diet. We also reviewed medication options with an emphasis on risk versus benefit of those discussed.   Repetitive spaced learning was employed today to elicit superior memory formation and behavioral change.  4. Class 2 severe obesity with serious comorbidity and body mass index (BMI) of 37.0 to 37.9 in adult, unspecified obesity type Rhonda [name]) Rhonda [name] is currently in the action stage of change. As such, her goal is to continue with weight loss efforts. She has agreed to the Category 2 Plan.   Exercise goals: As is.  Behavioral modification strategies: meal planning and cooking strategies and celebration eating strategies.  Rhonda [name] has agreed to follow-up with our clinic in 3 to 4 weeks. She was informed of the importance of frequent follow-up visits to maximize her success with intensive lifestyle modifications for her multiple health conditions.   Objective:   Blood pressure 116/79, pulse 76, temperature 97.9 F (36.6 C), temperature source Oral, height 5\' 10"  (1.778 m), weight 259 lb (117.5 kg), SpO2 97 %. Body mass index is 37.16 kg/m.  General: Cooperative, alert, well developed, in no acute distress. HEENT: Conjunctivae and lids unremarkable. Cardiovascular: Regular rhythm.  Lungs: Normal work of breathing. Neurologic: No focal deficits.   Lab Results  Component Value Date   CREATININE 0.89 09/21/2019   BUN 9 09/21/2019   NA 140 09/21/2019   K 4.5 09/21/2019   CL 103 09/21/2019  CO2 20 09/21/2019   Lab Results  Component Value Date   ALT 47 (H) 09/21/2019   AST 29 09/21/2019   ALKPHOS 70 09/21/2019   BILITOT 0.3 09/21/2019   Lab Results  Component Value Date   HGBA1C 5.2 09/21/2019   HGBA1C 5.1 04/20/2019   HGBA1C 5.2 10/09/2018   HGBA1C 5.2 01/09/2018   Lab Results    Component Value Date   INSULIN 20.3 09/21/2019   INSULIN 13.8 04/20/2019   INSULIN 13.0 10/09/2018   INSULIN 16.5 01/09/2018   Lab Results  Component Value Date   TSH 1.470 01/09/2018   Lab Results  Component Value Date   CHOL 153 01/09/2018   HDL 43 01/09/2018   LDLCALC 97 01/09/2018   TRIG 63 01/09/2018   Lab Results  Component Value Date   WBC 4.7 01/09/2018   HGB 14.5 01/09/2018   HCT 42.1 01/09/2018   MCV 89 01/09/2018   PLT 195 09/20/2016   No results found for: IRON, TIBC, FERRITIN  Attestation Statements:   Reviewed by clinician on day of visit: allergies, medications, problem list, medical history, surgical history, family history, social history, and previous encounter notes.   Wilhemena Durie, am acting as transcriptionist for [employer], PA-C.  I have reviewed the above documentation for accuracy and completeness, and I agree with the above. Abby Potash, PA-C

## 2019-11-30 ENCOUNTER — Other Ambulatory Visit: Payer: Self-pay | Admitting: Psychiatry

## 2019-11-30 DIAGNOSIS — F5105 Insomnia due to other mental disorder: Secondary | ICD-10-CM

## 2019-12-07 NOTE — Progress Notes (Signed)
  Office: [phone]  /  Fax: [phone]    Date: December 17, 2019   Appointment Start Time: 2:33pm Duration: 22 minutes Provider: Glennie Isle, Psy.D. Type of Session: Individual Therapy  Location of Patient: Parked in car outside a store Location of Provider: Provider's Home Type of Contact: Telepsychological Visit via MyChart Video Visit  Session Content: Rhonda [name] is a 34 y.o. female presenting via [place] Visit for a follow-up appointment to address the previously established treatment goal of increasing coping skills. Today's appointment was a telepsychological visit due to COVID-19. [name] provided verbal consent for today's telepsychological appointment and she is aware she is responsible for securing confidentiality on her end of the session. Prior to proceeding with today's appointment, [name]'s physical location at the time of this appointment was obtained as well a phone number she could be reached at in the event of technical difficulties. Carnella and this provider participated in today's telepsychological service.   Mirayah acknowledged understanding that for today's appointment and future telepsychological appointments, MyChart Video Visit will be utilized. She also verbally acknowledged understanding that the information outlined in the telepsychological informed consent form signed at the onset of treatment would still be applicable despite the change in the videoconferencing platform.   This provider conducted a brief check-in. [name] noted, "I've fallen off the band wagon." She explained deviations starting last Thursday. This was further explored and she was engaged in problem solving as well as goal setting. It was reflected she is likely not consuming enough protein, which is likely impacting her hunger overall. A goal to eat lunch congruent to the meal plan while at work 4 out of 5 days a week was established. Overall, [name] was receptive to today's appointment as  evidenced by openness to sharing, responsiveness to feedback, and willingness to work toward the established goal.  Mental Status Examination:  Appearance: well groomed and appropriate hygiene  Behavior: appropriate to circumstances Mood: euthymic Affect: mood congruent Speech: normal in rate, volume, and tone Eye Contact: appropriate Psychomotor Activity: appropriate Gait: unable to assess Thought Process: linear, logical, and goal directed  Thought Content/Perception: no hallucinations, delusions, bizarre thinking or behavior reported or observed and no evidence of suicidal and homicidal ideation, plan, and intent Orientation: time, person, place and purpose of appointment Memory/Concentration: memory, attention, language, and fund of knowledge intact  Insight/Judgment: good  Interventions:  Conducted a brief chart review Provided empathic reflections and validation Employed supportive psychotherapy interventions to facilitate reduced distress and to improve coping skills with identified stressors Employed motivational interviewing skills to assess patient's willingness/desire to adhere to recommended medical treatments and assignments Engaged patient in goal setting Engaged patient in problem solving  DSM-5 Diagnosis(es): 311 (F32.8) Other Specified Depressive Disorder, Emotional Eating Behaviors  Treatment Goal & Progress: During the initial appointment with this provider, the following treatment goal was established: increase coping skills. [name] has demonstrated progress in her goal as evidenced by increased awareness of hunger patterns and increased awareness of triggers for emotional eating. [name] also continues to demonstrate willingness to engage in learned skill(s).  Plan: Due to financial concerns, [name] requested to reduce the frequency of appointments at this time. As such, the next appointment will be scheduled in approximately three weeks, which will be via MyChart  Video Visit. The next session will focus on working towards the established treatment goal.

## 2019-12-17 ENCOUNTER — Telehealth (INDEPENDENT_AMBULATORY_CARE_PROVIDER_SITE_OTHER): Payer: No Typology Code available for payment source | Admitting: Psychology

## 2019-12-17 ENCOUNTER — Encounter (INDEPENDENT_AMBULATORY_CARE_PROVIDER_SITE_OTHER): Payer: Self-pay | Admitting: Physician Assistant

## 2019-12-17 ENCOUNTER — Ambulatory Visit (INDEPENDENT_AMBULATORY_CARE_PROVIDER_SITE_OTHER): Payer: No Typology Code available for payment source | Admitting: Physician Assistant

## 2019-12-17 ENCOUNTER — Other Ambulatory Visit: Payer: Self-pay

## 2019-12-17 VITALS — BP 116/75 | HR 80 | Temp 98.3°F | Ht 70.0 in | Wt 264.0 lb

## 2019-12-17 DIAGNOSIS — Z9189 Other specified personal risk factors, not elsewhere classified: Secondary | ICD-10-CM

## 2019-12-17 DIAGNOSIS — E559 Vitamin D deficiency, unspecified: Secondary | ICD-10-CM | POA: Diagnosis not present

## 2019-12-17 DIAGNOSIS — E8881 Metabolic syndrome: Secondary | ICD-10-CM | POA: Diagnosis not present

## 2019-12-17 DIAGNOSIS — F3289 Other specified depressive episodes: Secondary | ICD-10-CM

## 2019-12-17 DIAGNOSIS — Z6837 Body mass index (BMI) 37.0-37.9, adult: Secondary | ICD-10-CM

## 2019-12-17 MED ORDER — VITAMIN D (ERGOCALCIFEROL) 1.25 MG (50000 UNIT) PO CAPS: 50000.0000 [IU] | ORAL_CAPSULE | ORAL | 0 refills | Status: DC

## 2019-12-21 NOTE — Progress Notes (Signed)
Chief Complaint:   OBESITY Rhonda [name] is here to discuss her progress with her obesity treatment plan along with follow-up of her obesity related diagnoses. Rhonda [name] is on the Category 2 Plan and states she is following her eating plan approximately 254% of the time. Rhonda [name] states she is exercising 0 minutes 0 times per week.  Today's visit was #: 28 Starting weight: 273 lbs Starting date: 01/09/2018 Today's weight: 264 lbs Today's date: 12/17/2019 Total lbs lost to date: 9 Total lbs lost since last in-office visit: 0  Interim History: Rhonda [name] reports that she got off track during her girls' trip, but was able to get back on track when she returned. She fell off plan the last few days. She notes that if she does not eat lunch on plan, the rest of the day she eats whatever she wants.  Subjective:   Vitamin D deficiency. Rhonda [name] is on prescription Vitamin D. No nausea, vomiting, or muscle weakness. Last Vitamin D 34.4 on 09/21/2019.  Insulin resistance. Rhonda [name] has a diagnosis of insulin resistance based on her elevated fasting insulin level >5. She continues to work on diet and exercise to decrease her risk of diabetes. Rhonda [name] is on metformin. No nausea, vomiting, or diarrhea. No polyphagia.  Lab Results  Component Value Date   INSULIN 20.3 09/21/2019   INSULIN 13.8 04/20/2019   INSULIN 13.0 10/09/2018   INSULIN 16.5 01/09/2018   Lab Results  Component Value Date   HGBA1C 5.2 09/21/2019   At risk for osteoporosis. Rhonda [name] is at higher risk of osteopenia and osteoporosis due to Vitamin D deficiency.   Assessment/Plan:   Vitamin D deficiency. Low Vitamin D level contributes to fatigue and are associated with obesity, breast, and colon cancer. She was given a refill on her Vitamin D, Ergocalciferol, (DRISDOL) 1.25 MG (50000 UNIT) CAPS capsule every 3 days #10 with 0 refills and will follow-up for routine testing of Vitamin D, at least 2-3 times per year to avoid  over-replacement.     Insulin resistance. Rhonda [name] will continue to work on weight loss, exercise, and decreasing simple carbohydrates to help decrease the risk of diabetes. Rhonda [name] agreed to follow-up with [name] as directed to closely monitor her progress. She will continue metformin as directed.  At risk for osteoporosis. Rhonda [name] was given approximately 15 minutes of osteoporosis prevention counseling today. Rhonda [name] is at risk for osteopenia and osteoporosis due to her Vitamin D deficiency. She was encouraged to take her Vitamin D and follow her higher calcium diet and increase strengthening exercise to help strengthen her bones and decrease her risk of osteopenia and osteoporosis.  Repetitive spaced learning was employed today to elicit superior memory formation and behavioral change.  Class 2 severe obesity with serious comorbidity and body mass index (BMI) of 37.0 to 37.9 in adult, unspecified obesity type ([place]).  Rhonda [name] is currently in the action stage of change. As such, her goal is to continue with weight loss efforts. She has agreed to the Category 2 Plan.   Exercise goals: For substantial health benefits, adults should do at least 150 minutes (2 hours and 30 minutes) a week of moderate-intensity, or 75 minutes (1 hour and 15 minutes) a week of vigorous-intensity aerobic physical activity, or an equivalent combination of moderate- and vigorous-intensity aerobic activity. Aerobic activity should be performed in episodes of at least 10 minutes, and preferably, it should be spread throughout the week.  Behavioral modification strategies: meal planning and cooking strategies and planning for success.  Rhonda [name] has agreed to follow-up with our clinic in 3 weeks. She was informed of the importance of frequent follow-up visits to maximize her success with intensive lifestyle modifications for her multiple health conditions.   Objective:   Blood pressure 116/75, pulse 80, temperature 98.3 F (36.8  C), temperature source Oral, height 5\' 10"  (1.778 m), weight 264 lb (119.7 kg), SpO2 98 %. Body mass index is 37.88 kg/m.  General: Cooperative, alert, well developed, in no acute distress. HEENT: Conjunctivae and lids unremarkable. Cardiovascular: Regular rhythm.  Lungs: Normal work of breathing. Neurologic: No focal deficits.   Lab Results  Component Value Date   CREATININE 0.89 09/21/2019   BUN 9 09/21/2019   NA 140 09/21/2019   K 4.5 09/21/2019   CL 103 09/21/2019   CO2 20 09/21/2019   Lab Results  Component Value Date   ALT 47 (H) 09/21/2019   AST 29 09/21/2019   ALKPHOS 70 09/21/2019   BILITOT 0.3 09/21/2019   Lab Results  Component Value Date   HGBA1C 5.2 09/21/2019   HGBA1C 5.1 04/20/2019   HGBA1C 5.2 10/09/2018   HGBA1C 5.2 01/09/2018   Lab Results  Component Value Date   INSULIN 20.3 09/21/2019   INSULIN 13.8 04/20/2019   INSULIN 13.0 10/09/2018   INSULIN 16.5 01/09/2018   Lab Results  Component Value Date   TSH 1.470 01/09/2018   Lab Results  Component Value Date   CHOL 153 01/09/2018   HDL 43 01/09/2018   LDLCALC 97 01/09/2018   TRIG 63 01/09/2018   Lab Results  Component Value Date   WBC 4.7 01/09/2018   HGB 14.5 01/09/2018   HCT 42.1 01/09/2018   MCV 89 01/09/2018   PLT 195 09/20/2016   No results found for: IRON, TIBC, FERRITIN  Attestation Statements:   Reviewed by clinician on day of visit: allergies, medications, problem list, medical history, surgical history, family history, social history, and previous encounter notes.  IMichaelene Song, am acting as transcriptionist for Abby Potash, PA-C   I have reviewed the above documentation for accuracy and completeness, and I agree with the above. Abby Potash, PA-C

## 2019-12-23 NOTE — Progress Notes (Signed)
  Office: [phone]  /  Fax: [phone]    Date: Jan 06, 2020   Appointment Start Time: 2:35pm Duration: 27 minutes Provider: Glennie Isle, Psy.D.  Type of Session: Individual Therapy  Location of Patient: Parked in car Location of Provider: Provider's Home Type of Contact: Telepsychological Visit via MyChart Video Visit  Session Content: This provider called Rhonda [name] at 2:33pm as she did not present for the telepsychological appointment. Rhonda [name] noted she was looking for a spot to park and join the appointment. As such, today's appointment was initiated 5 minutes late. Rhonda [name] is a 34 y.o. female presenting via [place] Visit for a follow-up appointment to address the previously established treatment goal of increasing coping skills. Today's appointment was a telepsychological visit due to COVID-19. Rhonda [name] provided verbal consent for today's telepsychological appointment and she is aware she is responsible for securing confidentiality on her end of the session. Prior to proceeding with today's appointment, Rhonda [name]'s physical location at the time of this appointment was obtained as well a phone number she could be reached at in the event of technical difficulties. Rhonda [name] and this provider participated in today's telepsychological service.   This provider conducted a brief check-in. Rhonda [name] reported deviations from her meal plan due to being busy. She indicated, "I hate that I have to worry about it [referring to weight and eating habits]." As such, psychoeducation regarding values was provided and hypothetical scenarios were utilized to identify her values. The following values were identified: relationships, stability, caring, and faith. She discussed that she is not living life congruent to her value of stability, which is likely contributing to stress and emotional eating. Rhonda [name] was receptive to focusing on reducing stress between now and the next appointment. Rhonda [name] was receptive to  today's appointment as evidenced by openness to sharing, responsiveness to feedback, and willingness to explore values.  Mental Status Examination:  Appearance: well groomed and appropriate hygiene  Behavior: appropriate to circumstances Mood: euthymic Affect: mood congruent Speech: normal in rate, volume, and tone Eye Contact: appropriate Psychomotor Activity: appropriate Gait: unable to assess Thought Process: linear, logical, and goal directed  Thought Content/Perception: no hallucinations, delusions, bizarre thinking or behavior reported or observed and no evidence of suicidal and homicidal ideation, plan, and intent Orientation: time, person, place and purpose of appointment Memory/Concentration: memory, attention, language, and fund of knowledge intact  Insight/Judgment: good  Interventions:  Conducted a brief chart review Provided empathic reflections and validation Employed supportive psychotherapy interventions to facilitate reduced distress and to improve coping skills with identified stressors Employed motivational interviewing skills to assess patient's willingness/desire to adhere to recommended medical treatments and assignments Psychoeducation provided regarding values Explored patient's values   DSM-5 Diagnosis(es): 311 (F32.8) Other Specified Depressive Disorder, Emotional Eating Behaviors  Treatment Goal & Progress: During the initial appointment with this provider, the following treatment goal was established: increase coping skills. Rhonda [name] has demonstrated progress in her goal as evidenced by increased awareness of hunger patterns and increased awareness of triggers for emotional eating. Rhonda [name] also continues to demonstrate willingness to engage in learned skill(s).  Plan: Based on appointment availability and Rhonda [name]'s request, the next appointment will be scheduled in approximately one month, which will be via MyChart Video Visit. The next session will focus on  working towards the established treatment goal.

## 2019-12-31 ENCOUNTER — Encounter: Payer: Self-pay | Admitting: Psychiatry

## 2019-12-31 ENCOUNTER — Telehealth (INDEPENDENT_AMBULATORY_CARE_PROVIDER_SITE_OTHER): Payer: No Typology Code available for payment source | Admitting: Psychiatry

## 2019-12-31 ENCOUNTER — Other Ambulatory Visit: Payer: Self-pay

## 2019-12-31 DIAGNOSIS — F5105 Insomnia due to other mental disorder: Secondary | ICD-10-CM | POA: Diagnosis not present

## 2019-12-31 DIAGNOSIS — F411 Generalized anxiety disorder: Secondary | ICD-10-CM

## 2019-12-31 DIAGNOSIS — F3342 Major depressive disorder, recurrent, in full remission: Secondary | ICD-10-CM | POA: Diagnosis not present

## 2019-12-31 MED ORDER — BUSPIRONE HCL 10 MG PO TABS: 20.0000 mg | ORAL_TABLET | Freq: Every day | ORAL | 0 refills | Status: DC

## 2019-12-31 NOTE — Progress Notes (Signed)
Provider Location : [facility] Patient Location : Home  Virtual Visit via Video Note  I connected with Rhonda [name] on 12/31/19 at  4:00 PM EDT by a video enabled telemedicine application and verified that I am speaking with the correct person using two identifiers.   I discussed the limitations of evaluation and management by telemedicine and the availability of in person appointments. The patient expressed understanding and agreed to proceed.    I discussed the assessment and treatment plan with the patient. The patient was provided an opportunity to ask questions and all were answered. The patient agreed with the plan and demonstrated an understanding of the instructions.   The patient was advised to call back or seek an in-person evaluation if the symptoms worsen or if the condition fails to improve as anticipated.   Rhonda [name] OP Progress Note  12/31/2019 5:22 PM Rhonda [name]  MRN:  [id number]  Chief Complaint:  Chief Complaint    Follow-up     HPI: Rhonda [name] is a 34 year old Caucasian female, married, employed, Ship broker, has a history of depression, anxiety was evaluated by telemedicine today.  Patient today reports she is currently making progress on the current medication regimen.  She reports since she started dividing the dosage of BuSpar in to 10 mg in the morning and 10 mg in the evening her dizziness has resolved.  She reports her depression and anxiety symptoms as under control.  She reports sleep as good.  Patient denies any suicidality, homicidality or perceptual disturbances.  Patient reports she continues to work full-time and is taking classes part-time.  She looks forward to possibly graduating in 2023.  Patient denies any side effects to medications and is compliant on them.  She denies any other concerns today.  Visit Diagnosis:    ICD-10-CM   1. MDD (major depressive disorder), recurrent, in full remission ([place])  F33.42 busPIRone (BUSPAR) 10 MG  tablet  2. GAD (generalized anxiety disorder)  F41.1   3. Insomnia due to mental condition  F51.05     Past Psychiatric History: I have reviewed past psychiatric history from my progress note on 11/05/2018.  Past Medical History:  Past Medical History:  Diagnosis Date  . Depression    treated  . PCOS (polycystic ovarian syndrome) 2008  . Plantar fasciitis     Past Surgical History:  Procedure Laterality Date  . CHOLECYSTECTOMY      Family Psychiatric History: I have reviewed family psychiatric history from my progress note on 11/05/2018.  Family History:  Family History  Problem Relation Age of Onset  . High blood pressure Mother   . Depression Father   . Liver disease Father   . Alcoholism Father   . Alcohol abuse Father   . Diabetes Paternal Grandmother   . Bipolar disorder Maternal Aunt     Social History: I have reviewed social history from my progress note on 11/05/2018. Social History   Socioeconomic History  . Marital status: Married    Spouse name: [name]  . Number of children: 2  . Years of education: Not on file  . Highest education level: Bachelor's degree (e.g., BA, AB, BS)  Occupational History  . Occupation: Therapist, sports  Tobacco Use  . Smoking status: Never Smoker  . Smokeless tobacco: Never Used  Substance and Sexual Activity  . Alcohol use: No  . Drug use: No  . Sexual activity: Yes    Birth control/protection: None  Other Topics Concern  . Not on  file  Social History Narrative  . Not on file   Social Determinants of Health   Financial Resource Strain:   . Difficulty of Paying Living Expenses:   Food Insecurity:   . Worried About Charity fundraiser in the Last Year:   . Arboriculturist in the Last Year:   Transportation Needs:   . Film/video editor (Medical):   Marland Kitchen Lack of Transportation (Non-Medical):   Physical Activity:   . Days of Exercise per Week:   . Minutes of Exercise per Session:   Stress:   . Feeling of Stress :   Social  Connections:   . Frequency of Communication with Friends and Family:   . Frequency of Social Gatherings with Friends and Family:   . Attends Religious Services:   . Active Member of Clubs or Organizations:   . Attends Archivist Meetings:   Marland Kitchen Marital Status:     Allergies: No Known Allergies  Metabolic Disorder Labs: Lab Results  Component Value Date   HGBA1C 5.2 09/21/2019   No results found for: PROLACTIN Lab Results  Component Value Date   CHOL 153 01/09/2018   TRIG 63 01/09/2018   HDL 43 01/09/2018   LDLCALC 97 01/09/2018   Lab Results  Component Value Date   TSH 1.470 01/09/2018    Therapeutic Level Labs: No results found for: LITHIUM No results found for: VALPROATE No components found for:  CBMZ  Current Medications: Current Outpatient Medications  Medication Sig Dispense Refill  . buPROPion (WELLBUTRIN XL) 300 MG 24 hr tablet TAKE 1 TABLET BY MOUTH DAILY. 90 tablet 0  . busPIRone (BUSPAR) 10 MG tablet Take 2 tablets (20 mg total) by mouth daily with breakfast. 180 tablet 0  . cyclobenzaprine (FLEXERIL) 5 MG tablet Take 5 mg by mouth at bedtime as needed.    . cyclobenzaprine (FLEXERIL) 5 MG tablet Take by mouth.    . ELDERBERRY PO Take by mouth.    . fluconazole (DIFLUCAN) 150 MG tablet Take 150 mg by mouth once.    Marland Kitchen levocetirizine (XYZAL) 5 MG tablet Take 5 mg by mouth every evening.    Marland Kitchen levonorgestrel (MIRENA) 20 MCG/24HR IUD 1 each by Intrauterine route once.    . Melatonin 5 MG CHEW Chew 5 mg by mouth at bedtime.    . meloxicam (MOBIC) 15 MG tablet Take by mouth.    . metFORMIN (GLUCOPHAGE) 500 MG tablet Take 1 tablet (500 mg total) by mouth daily with breakfast. 30 tablet 0  . Multiple Vitamin (MULTIVITAMIN) tablet Take 1 tablet by mouth daily.    . Probiotic Product (PROBIOTIC-10 PO) Take by mouth.    . spironolactone (ALDACTONE) 100 MG tablet Take by mouth.    . Vitamin D, Ergocalciferol, (DRISDOL) 1.25 MG (50000 UNIT) CAPS capsule Take 1  capsule (50,000 Units total) by mouth every 3 (three) days. 10 capsule 0   No current facility-administered medications for this visit.     Musculoskeletal: Strength & Muscle Tone: UTA Gait & Station: normal Patient leans: N/A  Psychiatric Specialty Exam: Review of Systems  Psychiatric/Behavioral: Negative for agitation, behavioral problems, confusion, decreased concentration, dysphoric mood, hallucinations, self-injury, sleep disturbance and suicidal ideas. The patient is not nervous/anxious and is not hyperactive.   All other systems reviewed and are negative.   There were no vitals taken for this visit.There is no height or weight on file to calculate BMI.  General Appearance: Casual  Eye Contact:  Fair  Speech:  Normal Rate  Volume:  Normal  Mood:  Euthymic  Affect:  Congruent  Thought Process:  Goal Directed and Descriptions of Associations: Intact  Orientation:  Full (Time, Place, and Person)  Thought Content: Logical   Suicidal Thoughts:  No  Homicidal Thoughts:  No  Memory:  Immediate;   Fair Recent;   Fair Remote;   Fair  Judgement:  Fair  Insight:  Fair  Psychomotor Activity:  Normal  Concentration:  Concentration: Fair and Attention Span: Fair  Recall:  [employer] of Knowledge: Fair  Language: Fair  Akathisia:  No  Handed:  Right  AIMS (if indicated):UTA  Assets:  Communication Skills Desire for [place] Talents/Skills Transportation Vocational/Educational  ADL's:  Intact  Cognition: WNL  Sleep:  Fair   Screenings: PHQ2-9     Office Visit from 01/09/2018 in [place]  PHQ-2 Total Score  3  PHQ-9 Total Score  13       Assessment and Plan: Rhonda [name] is a 34 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  She is biologically predisposed given her history of mental health problems in her family.  Patient with psychosocial  stressors of being in school, job related stressors, having children at home and the current pandemic.  Patient however is currently tolerating medications and is stable on them.  Plan as noted below.  Plan MDD in remission Wellbutrin XL 300 mg p.o. daily BuSpar 10 mg p.o. twice daily.  Dose changed to divided dosage due to dizziness which has currently resolved. Continue l-methylfolate as prescribed for folic acid conversion reaction.  GAD-improving BuSpar 20 mg p.o. daily divided doses  Insomnia-improved Melatonin 3 to 9 mg p.o. nightly  GeneSight testing results were reviewed while making medication changes.  Follow-up in clinic in 3 months or sooner if needed.  I have spent atleast 20 minutes non face to face with patient today. More than 50 % of the time was spent for preparing to see the patient ( e.g., review of test, records ), ordering medications and test ,psychoeducation and supportive psychotherapy and care coordination,as well as documenting clinical information in electronic health record. This note was generated in part or whole with voice recognition software. Voice recognition is usually quite accurate but there are transcription errors that can and very often do occur. I apologize for any typographical errors that were not detected and corrected.        Ursula Alert, [name] 12/31/2019, 5:22 PM

## 2020-01-06 ENCOUNTER — Telehealth (INDEPENDENT_AMBULATORY_CARE_PROVIDER_SITE_OTHER): Payer: No Typology Code available for payment source | Admitting: Psychology

## 2020-01-06 ENCOUNTER — Other Ambulatory Visit: Payer: Self-pay

## 2020-01-06 ENCOUNTER — Ambulatory Visit (INDEPENDENT_AMBULATORY_CARE_PROVIDER_SITE_OTHER): Payer: No Typology Code available for payment source | Admitting: Family Medicine

## 2020-01-06 VITALS — BP 120/89 | HR 82 | Temp 98.0°F | Ht 70.0 in | Wt 262.0 lb

## 2020-01-06 DIAGNOSIS — Z9189 Other specified personal risk factors, not elsewhere classified: Secondary | ICD-10-CM | POA: Diagnosis not present

## 2020-01-06 DIAGNOSIS — E559 Vitamin D deficiency, unspecified: Secondary | ICD-10-CM | POA: Diagnosis not present

## 2020-01-06 DIAGNOSIS — F3289 Other specified depressive episodes: Secondary | ICD-10-CM | POA: Diagnosis not present

## 2020-01-06 DIAGNOSIS — E8881 Metabolic syndrome: Secondary | ICD-10-CM

## 2020-01-06 DIAGNOSIS — Z6837 Body mass index (BMI) 37.0-37.9, adult: Secondary | ICD-10-CM

## 2020-01-06 MED ORDER — METFORMIN HCL 500 MG PO TABS: 500.0000 mg | ORAL_TABLET | Freq: Every day | ORAL | 0 refills | Status: DC

## 2020-01-06 MED ORDER — VITAMIN D (ERGOCALCIFEROL) 1.25 MG (50000 UNIT) PO CAPS: 50000.0000 [IU] | ORAL_CAPSULE | ORAL | 0 refills | Status: DC

## 2020-01-06 MED ORDER — SAXENDA 18 MG/3ML ~~LOC~~ SOPN: 3.0000 mg | PEN_INJECTOR | Freq: Every day | SUBCUTANEOUS | 0 refills | Status: DC

## 2020-01-06 MED ORDER — INSULIN PEN NEEDLE 32G X 4 MM MISC: 0 refills | Status: DC

## 2020-01-07 ENCOUNTER — Encounter (INDEPENDENT_AMBULATORY_CARE_PROVIDER_SITE_OTHER): Payer: Self-pay | Admitting: Family Medicine

## 2020-01-07 NOTE — Progress Notes (Signed)
Chief Complaint:   OBESITY Rhonda [name] is here to discuss her progress with her obesity treatment plan along with follow-up of her obesity related diagnoses. Rhonda [name] is on the Category 2 Plan and states she is following her eating plan approximately 50% of the time. Rhonda [name] states she is doing 0 minutes 0 times per week.  Today's visit was #: 85 Starting weight: 273 lbs Starting date: 01/09/2018 Today's weight: 262 lbs Today's date: 01/06/2020 Total lbs lost to date: 11 Total lbs lost since last in-office visit: 2  Interim History: Rhonda [name] notes she is not good at meal planning. There is frequently tempting food at work that can get her off plan. She often skips breakfast.  Subjective:   1. Insulin resistance Rhonda [name] has a diagnosis of insulin resistance based on her elevated fasting insulin level >5. She notes polyphagia. She is on metformin for polycystic ovarian syndrome and polyphagia. She continues to work on diet and exercise to decrease her risk of diabetes.  Lab Results  Component Value Date   INSULIN 20.3 09/21/2019   INSULIN 13.8 04/20/2019   INSULIN 13.0 10/09/2018   INSULIN 16.5 01/09/2018   Lab Results  Component Value Date   HGBA1C 5.2 09/21/2019   2. Vitamin D deficiency Rhonda [name] is on prescription Vit D. Last Vit D level was low at 34.4.  3. At risk for side effect of medication Rhonda [name] is at risk for drug side effects due to starting Saxenda and is at risk for nausea.  Assessment/Plan:   1. Insulin resistance Dajha will continue to work on weight loss, exercise, and decreasing simple carbohydrates to help decrease the risk of diabetes. we will check labs today, and we will refill metformin for 1 month. Rhonda [name] agreed to follow-up with Rhonda [name] as directed to closely monitor her progress.  - metFORMIN (GLUCOPHAGE) 500 MG tablet; Take 1 tablet (500 mg total) by mouth daily with breakfast.  Dispense: 30 tablet; Refill: 0 - Comprehensive metabolic panel -  Hemoglobin A1c - Insulin, random  2. Vitamin D deficiency Low Vitamin D level contributes to fatigue and are associated with obesity, breast, and colon cancer. We will check labs today, and we will refill prescription Vitamin D for 1 month. Rhonda [name] will follow-up for routine testing of Vitamin D, at least 2-3 times per year to avoid over-replacement.  - Vitamin D, Ergocalciferol, (DRISDOL) 1.25 MG (50000 UNIT) CAPS capsule; Take 1 capsule (50,000 Units total) by mouth every 3 (three) days.  Dispense: 10 capsule; Refill: 0  - VITAMIN D 25 Hydroxy (Vit-D Deficiency, Fractures)  3. At risk for side effect of medication Rhonda [name] was given approximately 15 minutes of drug side effect counseling today. We discussed side effect possibility and risk versus benefits. Temperance agreed to the medication and will contact this office if these side effects are intolerable.  Repetitive spaced learning was employed today to elicit superior memory formation and behavioral change.  4. Class 2 severe obesity with serious comorbidity and body mass index (BMI) of 37.0 to 37.9 in adult, unspecified obesity type [facility]) Rhonda [name] is currently in the action stage of change. As such, her goal is to continue with weight loss efforts. She has agreed to the Category 2 Plan.   We discussed various medication options to help Rhonda [name] with her weight loss efforts and we both agreed to start Saxenda 3.0 mg SubQ daily, and nano needles #100 with no refills. Rhonda [name] is to take 0.3 mg SubQ for 3-4 days, and then increase to 0.6  mg if she has no nausea. She denies personal or family history of thyroid cancer, history of pancreatitis, or current cholelithiasis. She was informed of side effects.   - Liraglutide -Weight Management (SAXENDA) 18 MG/3ML SOPN; Inject 0.5 mLs (3 mg total) into the skin daily.  Dispense: 5 pen; Refill: 0 - Insulin Pen Needle 32G X 4 MM MISC; Use with saxenda daily  Dispense: 100 each; Refill: 0  Exercise  goals: All adults should avoid inactivity. Some physical activity is better than none, and adults who participate in any amount of physical activity gain some health benefits.  Behavioral modification strategies: increasing lean protein intake, no skipping meals and meal planning and cooking strategies.  Rhonda [name] has agreed to follow-up with our clinic in 2 to 3 weeks. She was informed of the importance of frequent follow-up visits to maximize her success with intensive lifestyle modifications for her multiple health conditions.   Objective:   Blood pressure 120/89, pulse 82, temperature 98 F (36.7 C), temperature source Oral, height 5\' 10"  (1.778 m), weight 262 lb (118.8 kg), SpO2 99 %. Body mass index is 37.59 kg/m.  General: Cooperative, alert, well developed, in no acute distress. HEENT: Conjunctivae and lids unremarkable. Cardiovascular: Regular rhythm.  Lungs: Normal work of breathing. Neurologic: No focal deficits.   Lab Results  Component Value Date   CREATININE 0.89 09/21/2019   BUN 9 09/21/2019   NA 140 09/21/2019   K 4.5 09/21/2019   CL 103 09/21/2019   CO2 20 09/21/2019   Lab Results  Component Value Date   ALT 47 (H) 09/21/2019   AST 29 09/21/2019   ALKPHOS 70 09/21/2019   BILITOT 0.3 09/21/2019   Lab Results  Component Value Date   HGBA1C 5.2 09/21/2019   HGBA1C 5.1 04/20/2019   HGBA1C 5.2 10/09/2018   HGBA1C 5.2 01/09/2018   Lab Results  Component Value Date   INSULIN 20.3 09/21/2019   INSULIN 13.8 04/20/2019   INSULIN 13.0 10/09/2018   INSULIN 16.5 01/09/2018   Lab Results  Component Value Date   TSH 1.470 01/09/2018   Lab Results  Component Value Date   CHOL 153 01/09/2018   HDL 43 01/09/2018   LDLCALC 97 01/09/2018   TRIG 63 01/09/2018   Lab Results  Component Value Date   WBC 4.7 01/09/2018   HGB 14.5 01/09/2018   HCT 42.1 01/09/2018   MCV 89 01/09/2018   PLT 195 09/20/2016   No results found for: IRON, TIBC, FERRITIN   Attestation Statements:   Reviewed by clinician on day of visit: allergies, medications, problem list, medical history, surgical history, family history, social history, and previous encounter notes.   Wilhemena Durie, am acting as Location manager for [employer], FNP-C.  I have reviewed the above documentation for accuracy and completeness, and I agree with the above. -  Georgianne Fick, FNP

## 2020-01-11 ENCOUNTER — Encounter (INDEPENDENT_AMBULATORY_CARE_PROVIDER_SITE_OTHER): Payer: Self-pay | Admitting: Family Medicine

## 2020-01-19 NOTE — Progress Notes (Unsigned)
Office: [phone]  /  Fax: [phone]    Date: February 01, 2020   Appointment Start Time: *** Duration: *** minutes Provider: Glennie Isle, Psy.D. Type of Session: Individual Therapy  Location of Patient: {gbptloc:23249} Location of Provider: {Location of Service:22491} Type of Contact: Telepsychological Visit via {gbtelepsych:23399}  Session Content: Rhonda [name] is a 34 y.o. female presenting via {gbtelepsych:23399} for a follow-up appointment to address the previously established treatment goal of increasing coping skills. Today's appointment was a telepsychological visit due to COVID-19. Sharleen provided verbal consent for today's telepsychological appointment and she is aware she is responsible for securing confidentiality on her end of the session. Prior to proceeding with today's appointment, Chatara's physical location at the time of this appointment was obtained as well a phone number she could be reached at in the event of technical difficulties. Niharika and this provider participated in today's telepsychological service.   This provider conducted a brief check-in and verbally administered the PHQ-9 and GAD-7. *** [name] was receptive to today's appointment as evidenced by openness to sharing, responsiveness to feedback, and {gbreceptiveness:23401}.  Mental Status Examination:  Appearance: {Appearance:22431} Behavior: {Behavior:22445} Mood: {gbmood:21757} Affect: {Affect:22436} Speech: {Speech:22432} Eye Contact: {Eye Contact:22433} Psychomotor Activity: {Motor Activity:22434} Gait: {gbgait:23404} Thought Process: {thought process:22448}  Thought Content/Perception: {disturbances:22451} Orientation: {Orientation:22437} Memory/Concentration: {gbcognition:22449} Insight/Judgment: {Insight:22446}  Structured Assessments Results: The Patient Health Questionnaire-9 (PHQ-9) is a self-report measure that assesses symptoms and severity of depression over the course of the last two  weeks. Birgitta obtained a score of *** suggesting {GBPHQ9SEVERITY:21752}. Arihana finds the endorsed symptoms to be {gbphq9difficulty:21754}. [0= Not at all; 1= Several days; 2= More than half the days; 3= Nearly every day] Little interest or pleasure in doing things ***  Feeling down, depressed, or hopeless ***  Trouble falling or staying asleep, or sleeping too much ***  Feeling tired or having little energy ***  Poor appetite or overeating ***  Feeling bad about yourself --- or that you are a failure or have let yourself or your family down ***  Trouble concentrating on things, such as reading the newspaper or watching television ***  Moving or speaking so slowly that other people could have noticed? Or the opposite --- being so fidgety or restless that you have been moving around a lot more than usual ***  Thoughts that you would be better off dead or hurting yourself in some way ***  PHQ-9 Score ***    The Generalized Anxiety Disorder-7 (GAD-7) is a brief self-report measure that assesses symptoms of anxiety over the course of the last two weeks. Dezarey obtained a score of *** suggesting {gbgad7severity:21753}. Jayana finds the endorsed symptoms to be {gbphq9difficulty:21754}. [0= Not at all; 1= Several days; 2= Over half the days; 3= Nearly every day] Feeling nervous, anxious, on edge ***  Not being able to stop or control worrying ***  Worrying too much about different things ***  Trouble relaxing ***  Being so restless that it's hard to sit still ***  Becoming easily annoyed or irritable ***  Feeling afraid as if something awful might happen ***  GAD-7 Score ***   Interventions:  {Interventions for Progress Notes:23405}  DSM-5 Diagnosis(es): 311 (F32.8) Other Specified Depressive Disorder, Emotional Eating Behaviors  Treatment Goal & Progress: During the initial appointment with this provider, the following treatment goal was established: increase coping skills. Katheryne has  demonstrated progress in her goal as evidenced by {gbtxprogress:22839}. Sonia also {gbtxprogress2:22951}.  Plan: The next appointment will be scheduled in {gbweeks:21758}, which will  be {gbtxmodality:23402}. The next session will focus on {Plan for Next Appointment:23400}.

## 2020-01-26 LAB — COMPREHENSIVE METABOLIC PANEL
ALT: 44 IU/L — ABNORMAL HIGH (ref 0–32)
AST: 30 IU/L (ref 0–40)
Albumin/Globulin Ratio: 1.9 (ref 1.2–2.2)
Albumin: 4.9 g/dL — ABNORMAL HIGH (ref 3.8–4.8)
Alkaline Phosphatase: 74 IU/L (ref 48–121)
BUN/Creatinine Ratio: 11 (ref 9–23)
BUN: 11 mg/dL (ref 6–20)
Bilirubin Total: 0.4 mg/dL (ref 0.0–1.2)
CO2: 20 mmol/L (ref 20–29)
Calcium: 9.6 mg/dL (ref 8.7–10.2)
Chloride: 105 mmol/L (ref 96–106)
Creatinine, Ser: 1.04 mg/dL — ABNORMAL HIGH (ref 0.57–1.00)
GFR calc Af Amer: 81 mL/min/{1.73_m2} (ref >59)
GFR calc non Af Amer: 70 mL/min/{1.73_m2} (ref >59)
Globulin, Total: 2.6 g/dL (ref 1.5–4.5)
Glucose: 87 mg/dL (ref 65–99)
Potassium: 4.3 mmol/L (ref 3.5–5.2)
Sodium: 141 mmol/L (ref 134–144)
Total Protein: 7.5 g/dL (ref 6.0–8.5)

## 2020-01-26 LAB — INSULIN, RANDOM: INSULIN: 9.9 u[IU]/mL (ref 2.6–24.9)

## 2020-01-26 LAB — HEMOGLOBIN A1C
Est. average glucose Bld gHb Est-mCnc: 103 mg/dL
Hgb A1c MFr Bld: 5.2 % (ref 4.8–5.6)

## 2020-01-26 LAB — VITAMIN D 25 HYDROXY (VIT D DEFICIENCY, FRACTURES): Vit D, 25-Hydroxy: 46.7 ng/mL (ref 30.0–100.0)

## 2020-02-01 ENCOUNTER — Ambulatory Visit (INDEPENDENT_AMBULATORY_CARE_PROVIDER_SITE_OTHER): Payer: No Typology Code available for payment source | Admitting: Family Medicine

## 2020-02-01 ENCOUNTER — Encounter (INDEPENDENT_AMBULATORY_CARE_PROVIDER_SITE_OTHER): Payer: Self-pay | Admitting: Family Medicine

## 2020-02-01 ENCOUNTER — Other Ambulatory Visit: Payer: Self-pay

## 2020-02-01 ENCOUNTER — Telehealth (INDEPENDENT_AMBULATORY_CARE_PROVIDER_SITE_OTHER): Payer: No Typology Code available for payment source | Admitting: Psychology

## 2020-02-01 VITALS — BP 140/83 | HR 79 | Temp 97.9°F | Ht 70.0 in | Wt 261.0 lb

## 2020-02-01 DIAGNOSIS — Z9189 Other specified personal risk factors, not elsewhere classified: Secondary | ICD-10-CM

## 2020-02-01 DIAGNOSIS — E8881 Metabolic syndrome: Secondary | ICD-10-CM | POA: Diagnosis not present

## 2020-02-01 DIAGNOSIS — E559 Vitamin D deficiency, unspecified: Secondary | ICD-10-CM

## 2020-02-01 DIAGNOSIS — E66812 Obesity, class 2: Secondary | ICD-10-CM

## 2020-02-01 DIAGNOSIS — Z6837 Body mass index (BMI) 37.0-37.9, adult: Secondary | ICD-10-CM

## 2020-02-01 DIAGNOSIS — E88819 Insulin resistance, unspecified: Secondary | ICD-10-CM

## 2020-02-01 MED ORDER — METFORMIN HCL 500 MG PO TABS: 500.0000 mg | ORAL_TABLET | Freq: Every day | ORAL | 0 refills | Status: DC

## 2020-02-01 MED ORDER — VITAMIN D (ERGOCALCIFEROL) 1.25 MG (50000 UNIT) PO CAPS: 50000.0000 [IU] | ORAL_CAPSULE | ORAL | 0 refills | Status: DC

## 2020-02-02 ENCOUNTER — Encounter (INDEPENDENT_AMBULATORY_CARE_PROVIDER_SITE_OTHER): Payer: Self-pay | Admitting: Family Medicine

## 2020-02-02 NOTE — Progress Notes (Signed)
Chief Complaint:   OBESITY Rhonda [name] is here to discuss her progress with her obesity treatment plan along with follow-up of her obesity related diagnoses. Rhonda [name] is on the Category 2 Plan and states she is following her eating plan approximately 50% of the time. Rhonda [name] states she is doing spin class for 45 minutes 2 times per week.  Today's visit was #: 15 Starting weight: 273 lbs Starting date: 01/09/2018 Today's weight: 261 lbs Today's date: 02/01/2020 Total lbs lost to date: 12 Total lbs lost since last in-office visit: 1  Interim History: Rhonda [name] started Saxenda recently. She notes occasional diarrhea. She would like to continue the Saxenda, as it does help with hunger. She is weighing daily. She notes she is eating out at lunch and supper several times per week.  Subjective:   1. Vitamin D deficiency Rhonda [name]'s Vit D is nearly at goal, and she is on Vit D every 3 days.  2. Insulin resistance Rhonda [name] has a diagnosis of insulin resistance based on her elevated fasting insulin level >5. She denies polyphagia on Saxenda and metformin. She continues to work on diet and exercise to decrease her risk of diabetes.  Lab Results  Component Value Date   INSULIN 9.9 01/25/2020   INSULIN 20.3 09/21/2019   INSULIN 13.8 04/20/2019   INSULIN 13.0 10/09/2018   INSULIN 16.5 01/09/2018   Lab Results  Component Value Date   HGBA1C 5.2 01/25/2020   3. At risk for osteoporosis Rhonda [name] is at higher risk of osteopenia and osteoporosis due to Vitamin D deficiency.   Assessment/Plan:   1. Vitamin D deficiency Low Vitamin D level contributes to fatigue and are associated with obesity, breast, and colon cancer. Rhonda [name] agreed to continue taking prescription Vitamin D 50,000 IU every 3 days for another 3 months then we will recheck labs. We will refill prescription Vit D for 1 month. She will follow-up for routine testing of Vitamin D, at least 2-3 times per year to avoid  over-replacement.  - Vitamin D, Ergocalciferol, (DRISDOL) 1.25 MG (50000 UNIT) CAPS capsule; Take 1 capsule (50,000 Units total) by mouth every 3 (three) days.  Dispense: 10 capsule; Refill: 0  2. Insulin resistance Rhonda [name] will continue Saxenda, and will continue to work on weight loss, diet, exercise, and decreasing simple carbohydrates to help decrease the risk of diabetes. we will refill metformin for 1 month. Rhonda [name] agreed to follow-up with [name] as directed to closely monitor her progress.  - metFORMIN (GLUCOPHAGE) 500 MG tablet; Take 1 tablet (500 mg total) by mouth daily with breakfast.  Dispense: 30 tablet; Refill: 0  3. At risk for osteoporosis Rhonda [name] was given approximately 15 minutes of osteoporosis prevention counseling today. Rhonda [name] is at risk for osteopenia and osteoporosis due to her Vitamin D deficiency. She was encouraged to take her Vitamin D and follow her higher calcium diet and increase strengthening exercise to help strengthen her bones and decrease her risk of osteopenia and osteoporosis.  Repetitive spaced learning was employed today to elicit superior memory formation and behavioral change.  4. Class 2 severe obesity with serious comorbidity and body mass index (BMI) of 37.0 to 37.9 in adult, unspecified obesity type [facility]) Rhonda [name] is currently in the action stage of change. As such, her goal is to continue with weight loss efforts. She has agreed to the Category 2 Plan.   We discussed various medication options to help Rhonda [name] with her weight loss efforts and we both agreed to increase Saxenda to 1.2 mg  daily. She is to weigh herself at home only 1 time per week. She may have lunch and supper off the plan once weekly.  Exercise goals: As is.  Behavioral modification strategies: decreasing eating out and dealing with family or coworker sabotage.  Rhonda [name] has agreed to follow-up with our clinic in 3 weeks. She was informed of the importance of frequent follow-up visits  to maximize her success with intensive lifestyle modifications for her multiple health conditions.   Objective:   Blood pressure 140/83, pulse 79, temperature 97.9 F (36.6 C), temperature source Oral, height 5\' 10"  (1.778 m), weight 261 lb (118.4 kg), SpO2 99 %. Body mass index is 37.45 kg/m.  General: Cooperative, alert, well developed, in no acute distress. HEENT: Conjunctivae and lids unremarkable. Cardiovascular: Regular rhythm.  Lungs: Normal work of breathing. Neurologic: No focal deficits.   Lab Results  Component Value Date   CREATININE 1.04 (H) 01/25/2020   BUN 11 01/25/2020   NA 141 01/25/2020   K 4.3 01/25/2020   CL 105 01/25/2020   CO2 20 01/25/2020   Lab Results  Component Value Date   ALT 44 (H) 01/25/2020   AST 30 01/25/2020   ALKPHOS 74 01/25/2020   BILITOT 0.4 01/25/2020   Lab Results  Component Value Date   HGBA1C 5.2 01/25/2020   HGBA1C 5.2 09/21/2019   HGBA1C 5.1 04/20/2019   HGBA1C 5.2 10/09/2018   HGBA1C 5.2 01/09/2018   Lab Results  Component Value Date   INSULIN 9.9 01/25/2020   INSULIN 20.3 09/21/2019   INSULIN 13.8 04/20/2019   INSULIN 13.0 10/09/2018   INSULIN 16.5 01/09/2018   Lab Results  Component Value Date   TSH 1.470 01/09/2018   Lab Results  Component Value Date   CHOL 153 01/09/2018   HDL 43 01/09/2018   LDLCALC 97 01/09/2018   TRIG 63 01/09/2018   Lab Results  Component Value Date   WBC 4.7 01/09/2018   HGB 14.5 01/09/2018   HCT 42.1 01/09/2018   MCV 89 01/09/2018   PLT 195 09/20/2016   No results found for: IRON, TIBC, FERRITIN  Attestation Statements:   Reviewed by clinician on day of visit: allergies, medications, problem list, medical history, surgical history, family history, social history, and previous encounter notes.   Wilhemena Durie, am acting as Location manager for [employer], FNP-C.  I have reviewed the above documentation for accuracy and completeness, and I agree with the above. -  Georgianne Fick, FNP

## 2020-02-08 ENCOUNTER — Ambulatory Visit (INDEPENDENT_AMBULATORY_CARE_PROVIDER_SITE_OTHER)
Admission: RE | Admit: 2020-02-08 | Discharge: 2020-02-08 | Disposition: A | Discharge status: Other Acute Inpatient Hospital | Payer: No Typology Code available for payment source | Source: Ambulatory Visit

## 2020-02-08 DIAGNOSIS — L03012 Cellulitis of left finger: Secondary | ICD-10-CM

## 2020-02-08 NOTE — Discharge Instructions (Signed)
Come to the Urgent Care for in-person evaluation of your symptoms.

## 2020-02-08 NOTE — ED Provider Notes (Signed)
Virtual Visit via Video Note:  Rhonda [name]  initiated request for Telemedicine visit with [facility] team. I connected with Rhonda [name]  on 02/08/2020 at 10:00 AM  for a synchronized telemedicine visit using a video enabled HIPPA compliant telemedicine application. I verified that I am speaking with Rhonda [name]  using two identifiers. Sharion Balloon, NP  was physically located in a [facility] Urgent care site and Rhonda [name] was located at a different location.   The limitations of evaluation and management by telemedicine as well as the availability of in-person appointments were discussed. Patient was informed that she  may incur a bill ( including co-pay) for this virtual visit encounter. Rhonda [name]  expressed understanding and gave verbal consent to proceed with virtual visit.     History of Present Illness:Rhonda [name]  is a 34 y.o. female presents for evaluation of left middle redness, pain, and swelling x 1 week.  Now has pus forming around nail; no drainage.  No fever or chills.    No Known Allergies   Past Medical History:  Diagnosis Date  . Depression    treated  . PCOS (polycystic ovarian syndrome) 2008  . Plantar fasciitis      Social History   Tobacco Use  . Smoking status: Never Smoker  . Smokeless tobacco: Never Used  Vaping Use  . Vaping Use: Never used  Substance Use Topics  . Alcohol use: No  . Drug use: No        Observations/Objective: Physical Exam  VITALS: Patient denies fever. GENERAL: Alert, appears well and in no acute distress. HEENT: Atraumatic. Oral mucosa appears moist. NECK: Normal movements of the head and neck. CARDIOPULMONARY: No increased WOB. Speaking in clear sentences. I:E ratio WNL.  MS: Moves all visible extremities without noticeable abnormality. PSYCH: Pleasant and cooperative, well-groomed. Speech normal rate and rhythm. Affect is appropriate. Insight and judgement are appropriate.  Attention is focused, linear, and appropriate.  NEURO: CN grossly intact. Oriented as arrived to appointment on time with no prompting. Moves both UE equally.  SKIN: Left middle finger paronychia.    Assessment and Plan:    ICD-10-CM   1. Paronychia of finger, left  L03.012        Follow Up Instructions: Instructed patient to come for in person evaluation as her paronychia will need I&D.  Patient agrees to plan of care.      I discussed the assessment and treatment plan with the patient. The patient was provided an opportunity to ask questions and all were answered. The patient agreed with the plan and demonstrated an understanding of the instructions.   The patient was advised to call back or seek an in-person evaluation if the symptoms worsen or if the condition fails to improve as anticipated.      Sharion Balloon, NP  02/08/2020 10:00 AM         Sharion Balloon, NP 02/08/20 1000

## 2020-02-09 ENCOUNTER — Emergency Department
Admission: RE | Admit: 2020-02-09 | Discharge: 2020-02-09 | Disposition: A | Discharge status: Home / Self Care | Payer: No Typology Code available for payment source | Source: Ambulatory Visit | Attending: Family Medicine | Admitting: Family Medicine

## 2020-02-09 ENCOUNTER — Other Ambulatory Visit: Payer: Self-pay

## 2020-02-09 VITALS — BP 116/76 | HR 73 | Temp 97.7°F | Resp 18 | Ht 70.0 in | Wt 265.0 lb

## 2020-02-09 DIAGNOSIS — L03012 Cellulitis of left finger: Secondary | ICD-10-CM

## 2020-02-09 MED ORDER — DOXYCYCLINE HYCLATE 100 MG PO CAPS: 100.0000 mg | ORAL_CAPSULE | Freq: Two times a day (BID) | ORAL | 0 refills | Status: DC

## 2020-02-09 NOTE — Discharge Instructions (Addendum)
Soak your finger in warm water for 20 minutes, 2-3 times a day.  Apply bacitracin to wound and keep bandaged until healed.

## 2020-02-09 NOTE — ED Triage Notes (Signed)
Left third finger infection x 1 week

## 2020-02-09 NOTE — ED Provider Notes (Signed)
[facility]    CSN: [id number] Arrival date & time: 02/09/20  0737      History   Chief Complaint Chief Complaint  Patient presents with  . Hand Pain    HPI Rhonda [name] is a 34 y.o. female.   Patient complains of pain/swelling/redness at the edge and base of her left middle fingernail for one week.  The area developed a pustule last night and pain resolved.  The history is provided by the patient.  Hand Pain This is a new problem. Episode onset: one week ago. The problem occurs constantly. The problem has been rapidly improving. Exacerbated by: contact. Nothing relieves the symptoms. She has tried nothing for the symptoms.    Past Medical History:  Diagnosis Date  . Depression    treated  . PCOS (polycystic ovarian syndrome) 2008  . Plantar fasciitis     Patient Active Problem List   Diagnosis Date Noted  . MDD (major depressive disorder), recurrent, in full remission ([place]) 12/31/2019  . Numbness and tingling in left hand 10/15/2019  . MDD (major depressive disorder), recurrent, in partial remission ([place]) 10/01/2019  . Cubital tunnel syndrome, left 09/07/2019  . Rotator cuff tendinitis, left 09/07/2019  . Insomnia due to mental condition 06/23/2019  . MDD (major depressive disorder), recurrent episode, mild ([place]) 05/04/2019  . GAD (generalized anxiety disorder) 05/04/2019  . Vitamin D deficiency 10/28/2018  . Insulin resistance 10/28/2018  . Class 2 severe obesity with serious comorbidity and body mass index (BMI) of 37.0 to 37.9 in adult [facility]) 10/28/2018  . Gestational hypertension 09/18/2016  . Pregnancy 05/07/2016  . First trimester screening 03/19/2016  . Family history of congenital heart defect   . Spontaneous abortion in second trimester 10/24/2012  . Depression 12/05/2011  . History of kidney stones 12/05/2011  . PCOS (polycystic ovarian syndrome) 12/05/2011    Past Surgical History:  Procedure Laterality Date  . CHOLECYSTECTOMY        OB History    Gravida  3   Para  2   Term  1   Preterm  1   AB      Living  1     SAB      TAB      Ectopic      Multiple  1   Live Births  1        Obstetric Comments   Hx PCO, reports several BV infections         Home Medications    Prior to Admission medications   Medication Sig Start Date End Date Taking? Authorizing Provider  buPROPion (WELLBUTRIN XL) 300 MG 24 hr tablet TAKE 1 TABLET BY MOUTH DAILY. 12/01/19   Ursula Alert, MD  busPIRone (BUSPAR) 10 MG tablet Take 2 tablets (20 mg total) by mouth daily with breakfast. 12/31/19   Ursula Alert, MD  cyclobenzaprine (FLEXERIL) 5 MG tablet Take 5 mg by mouth at bedtime as needed. 09/17/19   [provider]  cyclobenzaprine (FLEXERIL) 5 MG tablet Take by mouth. 09/17/19   [provider]  doxycycline (VIBRAMYCIN) 100 MG capsule Take 1 capsule (100 mg total) by mouth 2 (two) times daily. Take with food. 02/09/20   Kandra Nicolas, MD  ELDERBERRY PO Take by mouth.    [provider]  Insulin Pen Needle 32G X 4 MM MISC Use with saxenda daily 01/06/20   Whitmire, Arrie Aran W, FNP  levocetirizine (XYZAL) 5 MG tablet Take 5 mg by mouth every  evening.    [provider]  levonorgestrel (MIRENA) 20 MCG/24HR IUD 1 each by Intrauterine route once.    [provider]  Liraglutide -Weight Management (SAXENDA) 18 MG/3ML SOPN Inject 0.5 mLs (3 mg total) into the skin daily. 01/06/20   Whitmire, Joneen Boers, FNP  Melatonin 5 MG CHEW Chew 5 mg by mouth at bedtime.    [provider]  meloxicam (MOBIC) 15 MG tablet Take by mouth. 04/21/19 04/20/20  [provider]  metFORMIN (GLUCOPHAGE) 500 MG tablet Take 1 tablet (500 mg total) by mouth daily with breakfast. 02/01/20   Whitmire, Joneen Boers, FNP  Multiple Vitamin (MULTIVITAMIN) tablet Take 1 tablet by mouth daily.    [provider]  Probiotic Product (PROBIOTIC-10 PO) Take by mouth.    [provider]   spironolactone (ALDACTONE) 100 MG tablet Take by mouth. 05/09/18 05/09/19  [provider]  Vitamin D, Ergocalciferol, (DRISDOL) 1.25 MG (50000 UNIT) CAPS capsule Take 1 capsule (50,000 Units total) by mouth every 3 (three) days. 02/01/20   Whitmire, Joneen Boers, FNP    Family History Family History  Problem Relation Age of Onset  . High blood pressure Mother   . Depression Father   . Liver disease Father   . Alcoholism Father   . Alcohol abuse Father   . Diabetes Paternal Grandmother   . Bipolar disorder Maternal Aunt     Social History Social History   Tobacco Use  . Smoking status: Never Smoker  . Smokeless tobacco: Never Used  Vaping Use  . Vaping Use: Never used  Substance Use Topics  . Alcohol use: No  . Drug use: No     Allergies   Patient has no known allergies.   Review of Systems Review of Systems  Constitutional: Negative for chills, diaphoresis, fatigue and fever.  Musculoskeletal: Negative for arthralgias and joint swelling.  Skin: Positive for color change.  All other systems reviewed and are negative.    Physical Exam Triage Vital Signs ED Triage Vitals  Enc Vitals Group     BP 02/09/20 0912 116/76     Pulse Rate 02/09/20 0912 73     Resp 02/09/20 0912 18     Temp 02/09/20 0912 97.7 F (36.5 C)     Temp Source 02/09/20 0912 Oral     SpO2 02/09/20 0912 98 %     Weight 02/09/20 0913 265 lb (120.2 kg)     Height 02/09/20 0913 5\' 10"  (1.778 m)     Head Circumference --      Peak Flow --      Pain Score 02/09/20 0913 1     Pain Loc --      Pain Edu? --      Excl. in [place]? --    No data found.  Updated Vital Signs BP 116/76 (BP Location: Right Arm)   Pulse 73   Temp 97.7 F (36.5 C) (Oral)   Resp 18   Ht 5\' 10"  (1.778 m)   Wt 120.2 kg   SpO2 98%   BMI 38.02 kg/m   Visual Acuity Right Eye Distance:   Left Eye Distance:   Bilateral Distance:    Right Eye Near:   Left Eye Near:    Bilateral Near:     Physical Exam Vitals  and nursing note reviewed.  Constitutional:      General: She is not in acute distress. HENT:     Head: Normocephalic.  Eyes:  Pupils: Pupils are equal, round, and reactive to light.  Cardiovascular:     Rate and Rhythm: Normal rate.  Pulmonary:     Effort: Pulmonary effort is normal.  Musculoskeletal:     Left hand: Swelling present. No tenderness. Normal range of motion.       Hands:     Comments: Left middle finger distal phalanx has erythema and swelling at base and radial edge of fingernail.  A pustule is present on the radial edge of nail.  DIP joint has full range of motion.  There is minimal tenderness to palpation   Skin:    General: Skin is warm and dry.  Neurological:     Mental Status: She is alert.      UC Treatments / Results  Labs (all labs ordered are listed, but only abnormal results are displayed) Labs Reviewed  WOUND CULTURE    EKG   Radiology No results found.  Procedures Procedures  Incise and drain abscess Risks and benefits of procedure explained to patient and verbal consent obtained.  Using sterile technique, gently opened pustule on radial aspect of left middle fingernail.  Expressed a small amount of purulent fluid.  Applied bacitracin and bandage.   Medications Ordered in UC Medications - No data to display  Initial Impression / Assessment and Plan / UC Course  I have reviewed the triage vital signs and the nursing notes.  Pertinent labs & imaging results that were available during my care of the patient were reviewed by me and considered in my medical decision making (see chart for details).    Wound culture pending.  Begin doxycycline 100mg  BID for one week. Followup with Family Doctor if not resolved in one week.   Final Clinical Impressions(s) / UC Diagnoses   Final diagnoses:  Paronychia of left middle finger     Discharge Instructions     Soak your finger in warm water for 20 minutes, 2-3 times a day.  Apply bacitracin  to wound and keep bandaged until healed.    ED Prescriptions    Medication Sig Dispense Auth. Provider   doxycycline (VIBRAMYCIN) 100 MG capsule Take 1 capsule (100 mg total) by mouth 2 (two) times daily. Take with food. 14 capsule Kandra Nicolas, MD        Kandra Nicolas, MD 02/09/20 [phone]

## 2020-02-12 LAB — WOUND CULTURE
MICRO NUMBER:: 10619493
SPECIMEN QUALITY:: ADEQUATE

## 2020-02-18 ENCOUNTER — Encounter (INDEPENDENT_AMBULATORY_CARE_PROVIDER_SITE_OTHER): Payer: Self-pay

## 2020-02-23 ENCOUNTER — Ambulatory Visit (INDEPENDENT_AMBULATORY_CARE_PROVIDER_SITE_OTHER): Payer: No Typology Code available for payment source | Admitting: Family Medicine

## 2020-02-23 ENCOUNTER — Other Ambulatory Visit: Payer: Self-pay

## 2020-02-23 ENCOUNTER — Encounter (INDEPENDENT_AMBULATORY_CARE_PROVIDER_SITE_OTHER): Payer: Self-pay | Admitting: Family Medicine

## 2020-02-23 VITALS — BP 120/80 | HR 76 | Temp 97.7°F | Ht 70.0 in | Wt 262.0 lb

## 2020-02-23 DIAGNOSIS — E8881 Metabolic syndrome: Secondary | ICD-10-CM | POA: Diagnosis not present

## 2020-02-23 DIAGNOSIS — Z6837 Body mass index (BMI) 37.0-37.9, adult: Secondary | ICD-10-CM | POA: Diagnosis not present

## 2020-02-23 DIAGNOSIS — E559 Vitamin D deficiency, unspecified: Secondary | ICD-10-CM

## 2020-02-23 MED ORDER — VITAMIN D (ERGOCALCIFEROL) 1.25 MG (50000 UNIT) PO CAPS: 50000.0000 [IU] | ORAL_CAPSULE | ORAL | 0 refills | Status: DC

## 2020-02-23 MED ORDER — METFORMIN HCL 500 MG PO TABS: 500.0000 mg | ORAL_TABLET | Freq: Every day | ORAL | 0 refills | Status: DC

## 2020-02-25 ENCOUNTER — Encounter (INDEPENDENT_AMBULATORY_CARE_PROVIDER_SITE_OTHER): Payer: Self-pay | Admitting: Family Medicine

## 2020-02-25 NOTE — Progress Notes (Signed)
Chief Complaint:   OBESITY Rhonda [name] is here to discuss her progress with her obesity treatment plan along with follow-up of her obesity related diagnoses. Rhonda [name] is on the Category 2 Plan and states she is following her eating plan approximately 60% of the time. Rhonda [name] states she is doing spin class for 45 minutes 2 times per week.  Today's visit was #: 78 Starting weight: 273 lbs Starting date: 01/09/2018 Today's weight: 262 lbs Today's date: 02/23/2020 Total lbs lost to date: 11 Total lbs lost since last in-office visit: 0  Interim History: Rhonda [name] feels that Rhonda [name] helps with appetite. She is taking 1.2 mg daily. Her side effects have improved . She is getting all of the protein in on the plan. She has started spin class recently.  Subjective:   1. Vitamin D deficiency Linnie's last Vit D level was slightly low at 46.7. She is on prescription Vit D.  2. Insulin resistance Yides has a diagnosis of insulin resistance based on her elevated fasting insulin level >5. She is on metformin and Saxenda, and she denies polyphagia. She continues to work on diet and exercise to decrease her risk of diabetes.  Lab Results  Component Value Date   INSULIN 9.9 01/25/2020   INSULIN 20.3 09/21/2019   INSULIN 13.8 04/20/2019   INSULIN 13.0 10/09/2018   INSULIN 16.5 01/09/2018   Lab Results  Component Value Date   HGBA1C 5.2 01/25/2020   Assessment/Plan:   1. Vitamin D deficiency Low Vitamin D level contributes to fatigue and are associated with obesity, breast, and colon cancer. Bambie agreed to decrease prescription Vitamin D to 50,000 IU every week with no refills. She will follow-up for routine testing of Vitamin D, at least 2-3 times per year to avoid over-replacement.  - Vitamin D, Ergocalciferol, (DRISDOL) 1.25 MG (50000 UNIT) CAPS capsule; Take 1 capsule (50,000 Units total) by mouth every 7 (seven) days.  Dispense: 4 capsule; Refill: 0  2. Insulin resistance Rhonda [name] will  continue to work on weight loss, exercise, and decreasing simple carbohydrates to help decrease the risk of diabetes. we will refill metformin for 1 month. Rhonda [name] agreed to follow-up with [name] as directed to closely monitor her progress.  - metFORMIN (GLUCOPHAGE) 500 MG tablet; Take 1 tablet (500 mg total) by mouth daily with breakfast.  Dispense: 30 tablet; Refill: 0  3. Class 2 severe obesity with serious comorbidity and body mass index (BMI) of 37.0 to 37.9 in adult, unspecified obesity type Rhonda [name]) Rhonda [name] is currently in the action stage of change. As such, her goal is to continue with weight loss efforts. She has agreed to the Category 2 Plan.   We discussed various medication options to help Rhonda [name] with her weight loss efforts and we both agreed to continue 1.2 mg Saxenda at 1.2 mg daily. We discussed bariatric surgery at her request. I advised that she is not a candidate currently because she is 56 BMI without qualifying comorbidities unless PCOS is allowed. She must be 40 BMI to be a candidate without comorbidities.   Patient was advised that bariatric surgery is a tool for weight loss and will not be successful long-term without significant dietary changes and exercise.  Exercise goals: As is.  Behavioral modification strategies: increasing lean protein intake and decreasing eating out.  Rhonda [name] has agreed to follow-up with our clinic in 3 weeks. She was informed of the importance of frequent follow-up visits to maximize her success with intensive lifestyle modifications for her multiple health  conditions.   Objective:   Blood pressure 120/80, pulse 76, temperature 97.7 F (36.5 C), temperature source Oral, height 5\' 10"  (1.778 m), weight 262 lb (118.8 kg), SpO2 98 %. Body mass index is 37.59 kg/m.  General: Cooperative, alert, well developed, in no acute distress. HEENT: Conjunctivae and lids unremarkable. Cardiovascular: Regular rhythm.  Lungs: Normal work of  breathing. Neurologic: No focal deficits.   Lab Results  Component Value Date   CREATININE 1.04 (H) 01/25/2020   BUN 11 01/25/2020   NA 141 01/25/2020   K 4.3 01/25/2020   CL 105 01/25/2020   CO2 20 01/25/2020   Lab Results  Component Value Date   ALT 44 (H) 01/25/2020   AST 30 01/25/2020   ALKPHOS 74 01/25/2020   BILITOT 0.4 01/25/2020   Lab Results  Component Value Date   HGBA1C 5.2 01/25/2020   HGBA1C 5.2 09/21/2019   HGBA1C 5.1 04/20/2019   HGBA1C 5.2 10/09/2018   HGBA1C 5.2 01/09/2018   Lab Results  Component Value Date   INSULIN 9.9 01/25/2020   INSULIN 20.3 09/21/2019   INSULIN 13.8 04/20/2019   INSULIN 13.0 10/09/2018   INSULIN 16.5 01/09/2018   Lab Results  Component Value Date   TSH 1.470 01/09/2018   Lab Results  Component Value Date   CHOL 153 01/09/2018   HDL 43 01/09/2018   LDLCALC 97 01/09/2018   TRIG 63 01/09/2018   Lab Results  Component Value Date   WBC 4.7 01/09/2018   HGB 14.5 01/09/2018   HCT 42.1 01/09/2018   MCV 89 01/09/2018   PLT 195 09/20/2016   No results found for: IRON, TIBC, FERRITIN  Attestation Statements:   Reviewed by clinician on day of visit: allergies, medications, problem list, medical history, surgical history, family history, social history, and previous encounter notes.   Wilhemena Durie, am acting as Location manager for [employer], FNP-C.  I have reviewed the above documentation for accuracy and completeness, and I agree with the above. -  Georgianne Fick, FNP

## 2020-03-17 ENCOUNTER — Ambulatory Visit (INDEPENDENT_AMBULATORY_CARE_PROVIDER_SITE_OTHER): Payer: No Typology Code available for payment source | Admitting: Family Medicine

## 2020-03-21 ENCOUNTER — Encounter (INDEPENDENT_AMBULATORY_CARE_PROVIDER_SITE_OTHER): Payer: Self-pay | Admitting: Family Medicine

## 2020-03-21 ENCOUNTER — Other Ambulatory Visit: Payer: Self-pay

## 2020-03-21 ENCOUNTER — Ambulatory Visit (INDEPENDENT_AMBULATORY_CARE_PROVIDER_SITE_OTHER): Payer: No Typology Code available for payment source | Admitting: Family Medicine

## 2020-03-21 VITALS — BP 123/84 | HR 71 | Temp 97.7°F | Ht 70.0 in | Wt 261.0 lb

## 2020-03-21 DIAGNOSIS — Z6837 Body mass index (BMI) 37.0-37.9, adult: Secondary | ICD-10-CM | POA: Diagnosis not present

## 2020-03-21 DIAGNOSIS — E559 Vitamin D deficiency, unspecified: Secondary | ICD-10-CM | POA: Diagnosis not present

## 2020-03-21 DIAGNOSIS — E8881 Metabolic syndrome: Secondary | ICD-10-CM | POA: Diagnosis not present

## 2020-03-21 MED ORDER — VITAMIN D (ERGOCALCIFEROL) 1.25 MG (50000 UNIT) PO CAPS: 50000.0000 [IU] | ORAL_CAPSULE | ORAL | 0 refills | Status: DC

## 2020-03-21 MED ORDER — SAXENDA 18 MG/3ML ~~LOC~~ SOPN: 3.0000 mg | PEN_INJECTOR | Freq: Every day | SUBCUTANEOUS | 0 refills | Status: DC

## 2020-03-22 ENCOUNTER — Encounter (INDEPENDENT_AMBULATORY_CARE_PROVIDER_SITE_OTHER): Payer: Self-pay | Admitting: Family Medicine

## 2020-03-22 NOTE — Progress Notes (Signed)
Chief Complaint:   OBESITY Rhonda [name] is here to discuss her progress with her obesity treatment plan along with follow-up of her obesity related diagnoses. Rhonda [name] is on the Category 2 Plan and states she is following her eating plan approximately 75% of the time. Rhonda [name] states she is doing 0 minutes 0 times per week.  Today's visit was #: 54 Starting weight: 273 lbs Starting date: 01/09/2018 Today's weight: 261 lbs Today's date: 03/21/2020 Total lbs lost to date: 12 Total lbs lost since last in-office visit: 1  Interim History: Rhonda [name] went on a camping trip this past weekend and she reports she gained some weight. She says she had lost about 3 lbs before her trip. She is on Saxenda 1.2 mg and she feels it is helping with hunger.  Subjective:   1. Insulin resistance Rhonda [name] has a diagnosis of insulin resistance based on her elevated fasting insulin level >5. She is on Saxenda and she denies polyphagia. She continues to work on diet and exercise to decrease her risk of diabetes.  Lab Results  Component Value Date   INSULIN 9.9 01/25/2020   INSULIN 20.3 09/21/2019   INSULIN 13.8 04/20/2019   INSULIN 13.0 10/09/2018   INSULIN 16.5 01/09/2018   Lab Results  Component Value Date   HGBA1C 5.2 01/25/2020   2. Vitamin D deficiency Rhonda [name] is on Vit D 50,000 IU weekly. Last Vit D level was almost at goal at 46.7.  Assessment/Plan:   1. Insulin resistance Rhonda [name] will continue to work on weight loss, exercise, and decreasing simple carbohydrates to help decrease the risk of diabetes. Rhonda [name] agreed to discontinue metformin, and she will follow-up with [name] as directed to closely monitor her progress.  2. Vitamin D deficiency Low Vitamin D level contributes to fatigue and are associated with obesity, breast, and colon cancer. We will refill prescription Vitamin D for 1 month. Rhonda [name] will follow-up for routine testing of Vitamin D, at least 2-3 times per year to avoid  over-replacement.  - Vitamin D, Ergocalciferol, (DRISDOL) 1.25 MG (50000 UNIT) CAPS capsule; Take 1 capsule (50,000 Units total) by mouth every 7 (seven) days.  Dispense: 4 capsule; Refill: 0  3. Class 2 severe obesity with serious comorbidity and body mass index (BMI) of 37.0 to 37.9 in adult, unspecified obesity type Rhonda [name]) Rhonda [name] is currently in the action stage of change. As such, her goal is to continue with weight loss efforts. She has agreed to the Category 2 Plan.   We discussed various medication options to help Rhonda [name] with her weight loss efforts and we both agreed to continue Saxenda at 1.2 mg SubQ daily, and we will refill Saxenda at 3.0 mg for 1 month.  - Liraglutide -Weight Management (SAXENDA) 18 MG/3ML SOPN; Inject 0.5 mLs (3 mg total) into the skin daily.  Dispense: 5 pen; Refill: 0  Exercise goals: As is.  Behavioral modification strategies: increasing lean protein intake and decreasing simple carbohydrates.  Rhonda [name] has agreed to follow-up with our clinic in 3 weeks. She was informed of the importance of frequent follow-up visits to maximize her success with intensive lifestyle modifications for her multiple health conditions.   Objective:   Blood pressure 123/84, pulse 71, temperature 97.7 F (36.5 C), height 5\' 10"  (1.778 m), weight 261 lb (118.4 kg), SpO2 98 %. Body mass index is 37.45 kg/m.  General: Cooperative, alert, well developed, in no acute distress. HEENT: Conjunctivae and lids unremarkable. Cardiovascular: Regular rhythm.  Lungs: Normal work of breathing. Neurologic:  No focal deficits.   Lab Results  Component Value Date   CREATININE 1.04 (H) 01/25/2020   BUN 11 01/25/2020   NA 141 01/25/2020   K 4.3 01/25/2020   CL 105 01/25/2020   CO2 20 01/25/2020   Lab Results  Component Value Date   ALT 44 (H) 01/25/2020   AST 30 01/25/2020   ALKPHOS 74 01/25/2020   BILITOT 0.4 01/25/2020   Lab Results  Component Value Date   HGBA1C 5.2 01/25/2020     HGBA1C 5.2 09/21/2019   HGBA1C 5.1 04/20/2019   HGBA1C 5.2 10/09/2018   HGBA1C 5.2 01/09/2018   Lab Results  Component Value Date   INSULIN 9.9 01/25/2020   INSULIN 20.3 09/21/2019   INSULIN 13.8 04/20/2019   INSULIN 13.0 10/09/2018   INSULIN 16.5 01/09/2018   Lab Results  Component Value Date   TSH 1.470 01/09/2018   Lab Results  Component Value Date   CHOL 153 01/09/2018   HDL 43 01/09/2018   LDLCALC 97 01/09/2018   TRIG 63 01/09/2018   Lab Results  Component Value Date   WBC 4.7 01/09/2018   HGB 14.5 01/09/2018   HCT 42.1 01/09/2018   MCV 89 01/09/2018   PLT 195 09/20/2016   No results found for: IRON, TIBC, FERRITIN  Attestation Statements:   Reviewed by clinician on day of visit: allergies, medications, problem list, medical history, surgical history, family history, social history, and previous encounter notes.   Wilhemena Durie, am acting as Location manager for [employer], FNP-C.  I have reviewed the above documentation for accuracy and completeness, and I agree with the above. -  Georgianne Fick, FNP

## 2020-03-24 ENCOUNTER — Ambulatory Visit: Payer: Self-pay

## 2020-04-02 ENCOUNTER — Ambulatory Visit
Admission: RE | Admit: 2020-04-02 | Discharge: 2020-04-02 | Disposition: A | Discharge status: Home / Self Care | Payer: No Typology Code available for payment source | Source: Ambulatory Visit | Attending: Student | Admitting: Student

## 2020-04-02 ENCOUNTER — Other Ambulatory Visit: Payer: Self-pay

## 2020-04-02 VITALS — BP 126/79 | HR 98 | Temp 98.2°F | Resp 14 | Ht 70.0 in | Wt 260.0 lb

## 2020-04-02 DIAGNOSIS — H6981 Other specified disorders of Eustachian tube, right ear: Secondary | ICD-10-CM

## 2020-04-02 NOTE — ED Provider Notes (Signed)
[facility]    CSN: [id number] Arrival date & time: 04/02/20  1210      History   Chief Complaint Chief Complaint  Patient presents with  . Appointment  . Otalgia    right    HPI Rhonda [name] is a 34 y.o. female.   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  She states that it started first is a sore throat and later it started hurting more in the ear.  It is worse with swallowing .  She states now that the throat is not as sore as the ear.  She denies any recent swimming air travel or injury.  She has not noticed any discharge ringing or roaring or pain with movement of the tragus.  She denies any recent cough or cold.       Past Medical History:  Diagnosis Date  . Depression    treated  . PCOS (polycystic ovarian syndrome) 2008  . Plantar fasciitis     Patient Active Problem List   Diagnosis Date Noted  . MDD (major depressive disorder), recurrent, in full remission ([place]) 12/31/2019  . Numbness and tingling in left hand 10/15/2019  . MDD (major depressive disorder), recurrent, in partial remission ([place]) 10/01/2019  . Cubital tunnel syndrome, left 09/07/2019  . Rotator cuff tendinitis, left 09/07/2019  . Insomnia due to mental condition 06/23/2019  . MDD (major depressive disorder), recurrent episode, mild ([place]) 05/04/2019  . GAD (generalized anxiety disorder) 05/04/2019  . Vitamin D deficiency 10/28/2018  . Insulin resistance 10/28/2018  . Class 2 severe obesity with serious comorbidity and body mass index (BMI) of 37.0 to 37.9 in adult [facility]) 10/28/2018  . Gestational hypertension 09/18/2016  . Pregnancy 05/07/2016  . First trimester screening 03/19/2016  . Family history of congenital heart defect   . Spontaneous abortion in second trimester 10/24/2012  . Depression 12/05/2011  . History of kidney stones 12/05/2011  . PCOS (polycystic ovarian syndrome) 12/05/2011    Past Surgical History:  Procedure  Laterality Date  . CHOLECYSTECTOMY      OB History    Gravida  3   Para  2   Term  1   Preterm  1   AB      Living  1     SAB      TAB      Ectopic      Multiple  1   Live Births  1        Obstetric Comments   Hx PCO, reports several BV infections         Home Medications    Prior to Admission medications   Medication Sig Start Date End Date Taking? Authorizing Provider  buPROPion (WELLBUTRIN XL) 300 MG 24 hr tablet TAKE 1 TABLET BY MOUTH DAILY. 12/01/19  Yes Ursula Alert, MD  busPIRone (BUSPAR) 10 MG tablet Take 2 tablets (20 mg total) by mouth daily with breakfast. 12/31/19  Yes Eappen, Saramma, MD  cyclobenzaprine (FLEXERIL) 5 MG tablet Take 5 mg by mouth at bedtime as needed. 09/17/19  Yes [provider]  levocetirizine (XYZAL) 5 MG tablet Take 5 mg by mouth every evening.   Yes [provider]  levonorgestrel (MIRENA) 20 MCG/24HR IUD 1 each by Intrauterine route once.   Yes [provider]  Melatonin 5 MG CHEW Chew 5 mg by mouth at bedtime.   Yes [provider]  Multiple Vitamin (MULTIVITAMIN) tablet Take 1 tablet by  mouth daily.   Yes [provider]  Probiotic Product (PROBIOTIC-10 PO) Take by mouth.   Yes [provider]  spironolactone (ALDACTONE) 100 MG tablet Take by mouth. 05/09/18 04/02/20 Yes [provider]  Vitamin D, Ergocalciferol, (DRISDOL) 1.25 MG (50000 UNIT) CAPS capsule Take 1 capsule (50,000 Units total) by mouth every 7 (seven) days. 03/21/20  Yes Whitmire, Joneen Boers, FNP  cyclobenzaprine (FLEXERIL) 5 MG tablet Take by mouth. 09/17/19   [provider]  ELDERBERRY PO Take by mouth.    [provider]  Insulin Pen Needle 32G X 4 MM MISC Use with saxenda daily 01/06/20   Whitmire, Dawn W, FNP  Liraglutide -Weight Management (SAXENDA) 18 MG/3ML SOPN Inject 0.5 mLs (3 mg total) into the skin daily. 03/21/20   Whitmire, Joneen Boers, FNP  meloxicam (MOBIC) 15 MG tablet Take by  mouth. 04/21/19 04/20/20  [provider]    Family History Family History  Problem Relation Age of Onset  . High blood pressure Mother   . Depression Father   . Liver disease Father   . Alcoholism Father   . Alcohol abuse Father   . Diabetes Paternal Grandmother   . Bipolar disorder Maternal Aunt     Social History Social History   Tobacco Use  . Smoking status: Never Smoker  . Smokeless tobacco: Never Used  Vaping Use  . Vaping Use: Never used  Substance Use Topics  . Alcohol use: No  . Drug use: No     Allergies   Patient has no known allergies.   Review of Systems Review of Systems  Constitutional: Positive for activity change. Negative for appetite change, chills, fatigue and fever.  HENT: Positive for ear pain and sore throat. Negative for ear discharge, hearing loss, tinnitus and trouble swallowing.   All other systems reviewed and are negative.    Physical Exam Triage Vital Signs ED Triage Vitals  Enc Vitals Group     BP 04/02/20 1234 126/79     Pulse Rate 04/02/20 1234 98     Resp 04/02/20 1234 14     Temp 04/02/20 1234 98.2 F (36.8 C)     Temp Source 04/02/20 1234 Oral     SpO2 04/02/20 1234 99 %     Weight 04/02/20 1231 260 lb (117.9 kg)     Height 04/02/20 1231 5\' 10"  (1.778 m)     Head Circumference --      Peak Flow --      Pain Score 04/02/20 1231 5     Pain Loc --      Pain Edu? --      Excl. in [place]? --    No data found.  Updated Vital Signs BP 126/79 (BP Location: Right Arm)   Pulse 98   Temp 98.2 F (36.8 C) (Oral)   Resp 14   Ht 5\' 10"  (1.778 m)   Wt 260 lb (117.9 kg)   SpO2 99%   BMI 37.31 kg/m   Visual Acuity Right Eye Distance:   Left Eye Distance:   Bilateral Distance:    Right Eye Near:   Left Eye Near:    Bilateral Near:     Physical Exam Vitals and nursing note reviewed.  Constitutional:      General: She is not in acute distress.    Appearance: Normal appearance. She is obese. She is not  ill-appearing or toxic-appearing.  HENT:     Head: Normocephalic and atraumatic.  Right Ear: Tympanic membrane, ear canal and external ear normal.     Left Ear: Tympanic membrane, ear canal and external ear normal.     Nose: Nose normal.     Mouth/Throat:     Mouth: Mucous membranes are moist.     Pharynx: Oropharynx is clear. Posterior oropharyngeal erythema present.  Eyes:     Conjunctiva/sclera: Conjunctivae normal.  Cardiovascular:     Rate and Rhythm: Normal rate and regular rhythm.  Pulmonary:     Effort: Pulmonary effort is normal.     Breath sounds: Normal breath sounds.  Musculoskeletal:        General: Normal range of motion.     Cervical back: Normal range of motion and neck supple.  Skin:    General: Skin is warm and dry.  Neurological:     General: No focal deficit present.     Mental Status: She is alert and oriented to person, place, and time.  Psychiatric:        Mood and Affect: Mood normal.        Behavior: Behavior normal.        Thought Content: Thought content normal.        Judgment: Judgment normal.      UC Treatments / Results  Labs (all labs ordered are listed, but only abnormal results are displayed) Labs Reviewed - No data to display  EKG   Radiology No results found.  Procedures Procedures (including critical care time)  Medications Ordered in UC Medications - No data to display  Initial Impression / Assessment and Plan / UC Course  I have reviewed the triage vital signs and the nursing notes.  Pertinent labs & imaging results that were available during my care of the patient were reviewed by me and considered in my medical decision making (see chart for details).   34 year old female presents with initial sore throat yesterday and ear pain that kept her awake last night.  States that the ear pain is worse when swallowing.  She has had no popping.  She has had no discharge or tinnitus.  She has had no history of swimming or air  travel.  Examination showed mild erythema of the throat but no exudate or petechiae present.  No swelling ; the uvula midline with no swelling.  Tenderness inferior to the ear along the jawline.  TMs were normal.  The canals were normal.  I told the patient that she likely has eustachian tube dysfunction.  I have advised her to use Flonase nasal spray on a daily basis for the next 3 to 4 weeks.  He also find benefit in a decongestant such as Zyrtec-D or Allegra-D.  The pain she should use Mobic which she has leftover from shoulder problem in the past.  If she is not improving in 2 to 3 weeks recommend following up with the primary care or with an ENT.   Final Clinical Impressions(s) / UC Diagnoses   Final diagnoses:  Eustachian tube dysfunction, right     Discharge Instructions     Use Flonase daily with 2 sprays in each nostril.  May consider using Zyrtec D or Allegra-D.  If you are not improving in 2-3 weeks recommend following up with your primary care or an ear nose and throat specialist    ED Prescriptions    None     PDMP not reviewed this encounter.   Lorin Picket, PA-C 04/02/20 1321

## 2020-04-02 NOTE — ED Triage Notes (Signed)
Patient c/o right ear pain that started yesterday.  Patient denies fever.

## 2020-04-02 NOTE — Discharge Instructions (Addendum)
Use Flonase daily with 2 sprays in each nostril.  May consider using Zyrtec D or Allegra-D.  If you are not improving in 2-3 weeks recommend following up with your primary care or an ear nose and throat specialist

## 2020-04-06 ENCOUNTER — Telehealth: Payer: Self-pay

## 2020-04-06 DIAGNOSIS — F3342 Major depressive disorder, recurrent, in full remission: Secondary | ICD-10-CM

## 2020-04-06 MED ORDER — BUSPIRONE HCL 10 MG PO TABS: 20.0000 mg | ORAL_TABLET | Freq: Every day | ORAL | 0 refills | Status: DC

## 2020-04-06 NOTE — Telephone Encounter (Signed)
Patient called requesting a refill on her Buspirone 10mg  to be sent to [place]. Followup scheduled for 05/10/20. Thank you

## 2020-04-06 NOTE — Telephone Encounter (Signed)
I have sent Buspar to pharmacy. ?

## 2020-04-07 ENCOUNTER — Telehealth: Payer: No Typology Code available for payment source | Admitting: Psychiatry

## 2020-04-13 ENCOUNTER — Ambulatory Visit (INDEPENDENT_AMBULATORY_CARE_PROVIDER_SITE_OTHER): Payer: No Typology Code available for payment source | Admitting: Family Medicine

## 2020-04-14 ENCOUNTER — Other Ambulatory Visit: Payer: Self-pay

## 2020-04-14 ENCOUNTER — Ambulatory Visit (INDEPENDENT_AMBULATORY_CARE_PROVIDER_SITE_OTHER): Payer: No Typology Code available for payment source | Admitting: Family Medicine

## 2020-04-14 ENCOUNTER — Encounter (INDEPENDENT_AMBULATORY_CARE_PROVIDER_SITE_OTHER): Payer: Self-pay | Admitting: Family Medicine

## 2020-04-14 VITALS — BP 117/86 | HR 92 | Temp 98.5°F | Ht 70.0 in | Wt 253.0 lb

## 2020-04-14 DIAGNOSIS — Z6836 Body mass index (BMI) 36.0-36.9, adult: Secondary | ICD-10-CM | POA: Diagnosis not present

## 2020-04-14 DIAGNOSIS — E8881 Metabolic syndrome: Secondary | ICD-10-CM | POA: Diagnosis not present

## 2020-04-14 MED ORDER — INSULIN PEN NEEDLE 32G X 4 MM MISC: 0 refills | Status: DC

## 2020-04-18 NOTE — Progress Notes (Signed)
Chief Complaint:   OBESITY Rhonda [name] is here to discuss her progress with her obesity treatment plan along with follow-up of her obesity related diagnoses. Rhonda [name] is on the Category 2 Plan and states she is following her eating plan approximately 75% of the time. Rhonda [name] states she is doing 0 minutes 0 times per week.  Today's visit was #: 47 Starting weight: 273 lbs Starting date: 01/09/2018 Today's weight: 253 lbs Today's date: 04/14/2020 Total lbs lost to date: 20 Total lbs lost since last in-office visit: 8  Interim History: Rhonda [name] feels Kirke Shaggy is suppressing her appetite at 1.2 mg daily. She has no appetite partially due to marital stress. She is trying to get protein in. Her mom cooks supper and it generally is something that fits into her meal plan.  Subjective:   1. Insulin resistance Rhonda [name] has a diagnosis of insulin resistance based on her elevated fasting insulin level >5. She reports lack of appetite due to stress and Saxenda. She continues to work on diet and exercise to decrease her risk of diabetes.  Lab Results  Component Value Date   INSULIN 9.9 01/25/2020   INSULIN 20.3 09/21/2019   INSULIN 13.8 04/20/2019   INSULIN 13.0 10/09/2018   INSULIN 16.5 01/09/2018   Lab Results  Component Value Date   HGBA1C 5.2 01/25/2020   Assessment/Plan:   1. Insulin resistance Rhonda [name] will continue Saxenda, and will continue to work on weight loss, exercise, and decreasing simple carbohydrates to help decrease the risk of diabetes. Rhonda [name] agreed to follow-up with [name] as directed to closely monitor her progress.  2. Class 2 severe obesity with serious comorbidity and body mass index (BMI) of 36.0 to 36.9 in adult, unspecified obesity type [facility]) Rhonda [name] is currently in the action stage of change. As such, her goal is to continue with weight loss efforts. She has agreed to the Category 2 Plan.   We discussed various medication options to help Rhonda [name] with her weight loss  efforts and we both agreed to continue Saxenda at 1.2 mg daily, and we will refill nano needles #100 with no refill.  - Insulin Pen Needle 32G X 4 MM MISC; Use with saxenda daily  Dispense: 100 each; Refill: 0  Exercise goals: No exercise has been prescribed at this time.  Behavioral modification strategies: increasing lean protein intake, decreasing simple carbohydrates and planning for success.  Rhonda [name] has agreed to follow-up with our clinic in 3 weeks. She was informed of the importance of frequent follow-up visits to maximize her success with intensive lifestyle modifications for her multiple health conditions.   Objective:   Blood pressure 117/86, pulse 92, temperature 98.5 F (36.9 C), temperature source Oral, height 5\' 10"  (1.778 m), weight 253 lb (114.8 kg), SpO2 97 %. Body mass index is 36.3 kg/m.  General: Cooperative, alert, well developed, in no acute distress. HEENT: Conjunctivae and lids unremarkable. Cardiovascular: Regular rhythm.  Lungs: Normal work of breathing. Neurologic: No focal deficits.   Lab Results  Component Value Date   CREATININE 1.04 (H) 01/25/2020   BUN 11 01/25/2020   NA 141 01/25/2020   K 4.3 01/25/2020   CL 105 01/25/2020   CO2 20 01/25/2020   Lab Results  Component Value Date   ALT 44 (H) 01/25/2020   AST 30 01/25/2020   ALKPHOS 74 01/25/2020   BILITOT 0.4 01/25/2020   Lab Results  Component Value Date   HGBA1C 5.2 01/25/2020   HGBA1C 5.2 09/21/2019   HGBA1C 5.1 04/20/2019  HGBA1C 5.2 10/09/2018   HGBA1C 5.2 01/09/2018   Lab Results  Component Value Date   INSULIN 9.9 01/25/2020   INSULIN 20.3 09/21/2019   INSULIN 13.8 04/20/2019   INSULIN 13.0 10/09/2018   INSULIN 16.5 01/09/2018   Lab Results  Component Value Date   TSH 1.470 01/09/2018   Lab Results  Component Value Date   CHOL 153 01/09/2018   HDL 43 01/09/2018   LDLCALC 97 01/09/2018   TRIG 63 01/09/2018   Lab Results  Component Value Date   WBC 4.7  01/09/2018   HGB 14.5 01/09/2018   HCT 42.1 01/09/2018   MCV 89 01/09/2018   PLT 195 09/20/2016   No results found for: IRON, TIBC, FERRITIN  Attestation Statements:   Reviewed by clinician on day of visit: allergies, medications, problem list, medical history, surgical history, family history, social history, and previous encounter notes.   Wilhemena Durie, am acting as Location manager for [employer], FNP-C.  I have reviewed the above documentation for accuracy and completeness, and I agree with the above. -  Georgianne Fick, FNP

## 2020-04-19 ENCOUNTER — Telehealth: Payer: No Typology Code available for payment source | Admitting: Physician Assistant

## 2020-04-19 DIAGNOSIS — J019 Acute sinusitis, unspecified: Secondary | ICD-10-CM | POA: Diagnosis not present

## 2020-04-19 MED ORDER — AMOXICILLIN-POT CLAVULANATE 875-125 MG PO TABS: 1.0000 | ORAL_TABLET | Freq: Two times a day (BID) | ORAL | 0 refills | Status: DC

## 2020-04-19 NOTE — Progress Notes (Signed)
We are sorry that you are not feeling well.  Here is how we plan to help!  Based on what you have shared with me it looks like you have sinusitis.  Sinusitis is inflammation and infection in the sinus cavities of the head.  Based on your presentation I believe you most likely have Acute Bacterial Sinusitis.  This is an infection caused by bacteria and is treated with antibiotics. I have prescribed Augmentin 875mg/125mg one tablet twice daily with food, for 7 days. You may use an oral decongestant such as Mucinex D or if you have glaucoma or high blood pressure use plain Mucinex. Saline nasal spray help and can safely be used as often as needed for congestion.  If you develop worsening sinus pain, fever or notice severe headache and vision changes, or if symptoms are not better after completion of antibiotic, please schedule an appointment with a health care provider.    Sinus infections are not as easily transmitted as other respiratory infection, however we still recommend that you avoid close contact with loved ones, especially the very young and elderly.  Remember to wash your hands thoroughly throughout the day as this is the number one way to prevent the spread of infection!  Home Care:  Only take medications as instructed by your medical team.  Complete the entire course of an antibiotic.  Do not take these medications with alcohol.  A steam or ultrasonic humidifier can help congestion.  You can place a towel over your head and breathe in the steam from hot water coming from a faucet.  Avoid close contacts especially the very young and the elderly.  Cover your mouth when you cough or sneeze.  Always remember to wash your hands.  Get Help Right Away If:  You develop worsening fever or sinus pain.  You develop a severe head ache or visual changes.  Your symptoms persist after you have completed your treatment plan.  Make sure you  Understand these instructions.  Will watch your  condition.  Will get help right away if you are not doing well or get worse.  Your e-visit answers were reviewed by a board certified advanced clinical practitioner to complete your personal care plan.  Depending on the condition, your plan could have included both over the counter or prescription medications.  If there is a problem please reply  once you have received a response from your provider.  Your safety is important to us.  If you have drug allergies check your prescription carefully.    You can use MyChart to ask questions about today's visit, request a non-urgent call back, or ask for a work or school excuse for 24 hours related to this e-Visit. If it has been greater than 24 hours you will need to follow up with your provider, or enter a new e-Visit to address those concerns.  You will get an e-mail in the next two days asking about your experience.  I hope that your e-visit has been valuable and will speed your recovery. Thank you for using e-visits.   Greater than 5 minutes, yet less than 10 minutes of time have been spent researching, coordinating, and implementing care for this patient today  

## 2020-05-10 ENCOUNTER — Encounter: Payer: Self-pay | Admitting: Psychiatry

## 2020-05-10 ENCOUNTER — Telehealth (INDEPENDENT_AMBULATORY_CARE_PROVIDER_SITE_OTHER): Payer: No Typology Code available for payment source | Admitting: Psychiatry

## 2020-05-10 ENCOUNTER — Other Ambulatory Visit: Payer: Self-pay

## 2020-05-10 ENCOUNTER — Other Ambulatory Visit: Payer: Self-pay | Admitting: Psychiatry

## 2020-05-10 DIAGNOSIS — F5105 Insomnia due to other mental disorder: Secondary | ICD-10-CM

## 2020-05-10 DIAGNOSIS — F411 Generalized anxiety disorder: Secondary | ICD-10-CM

## 2020-05-10 DIAGNOSIS — F3342 Major depressive disorder, recurrent, in full remission: Secondary | ICD-10-CM | POA: Diagnosis not present

## 2020-05-10 MED ORDER — BUPROPION HCL ER (XL) 300 MG PO TB24: 300.0000 mg | ORAL_TABLET | Freq: Every day | ORAL | 0 refills | Status: DC

## 2020-05-10 MED ORDER — DOXEPIN HCL 10 MG PO CAPS: 10.0000 mg | ORAL_CAPSULE | Freq: Every evening | ORAL | 1 refills | Status: DC | PRN

## 2020-05-10 NOTE — Progress Notes (Signed)
Provider Location : [facility] Patient Location : Car  Participants: Patient , Provider  Virtual Visit via Video Note  I connected with DEBBERA WOLKEN on 05/10/20 at  3:20 PM EDT by a video enabled telemedicine application and verified that I am speaking with the correct person using two identifiers.   I discussed the limitations of evaluation and management by telemedicine and the availability of in person appointments. The patient expressed understanding and agreed to proceed.  I discussed the assessment and treatment plan with the patient. The patient was provided an opportunity to ask questions and all were answered. The patient agreed with the plan and demonstrated an understanding of the instructions.   The patient was advised to call back or seek an in-person evaluation if the symptoms worsen or if the condition fails to improve as anticipated.   [place] MD OP Progress Note  05/10/2020 3:44 PM AZRAEL HUSS  MRN:  [id number]  Chief Complaint:  Chief Complaint    Follow-up     HPI: MARGAURITE SALIDO is a 34 year old Caucasian female, married, employed, has a history of depression, anxiety was evaluated by telemedicine today.  Patient today reports she is currently going through relationship struggles with her spouse.  She reports that does affect her mood to some extent.  She however reports it is nothing out of ordinary and she is coping okay.  She does struggle with sleep.  This has been getting worse since the past few weeks.  She has difficulty falling asleep as well as maintaining sleep.  Patient reports she does not believe the melatonin is beneficial anymore.  She has not tried any other medication in the past as far as she knows.  Patient denies any suicidality, homicidality or perceptual disturbances.  Patient reports work is going okay.  She continues to take classes part-time.  She is currently in psychotherapy session with her therapist Ms. Raelyn Ensign.  She reports  therapy sessions are currently on a weekly basis and they are beneficial.  Patient denies any other concerns today.  Visit Diagnosis:    ICD-10-CM   1. MDD (major depressive disorder), recurrent, in full remission ([place])  F33.42   2. GAD (generalized anxiety disorder)  F41.1   3. Insomnia due to mental condition  F51.05 doxepin (SINEQUAN) 10 MG capsule    buPROPion (WELLBUTRIN XL) 300 MG 24 hr tablet    Past Psychiatric History: I have reviewed past psychiatric history from my progress note on 11/05/2018.  Past Medical History:  Past Medical History:  Diagnosis Date  . Depression    treated  . PCOS (polycystic ovarian syndrome) 2008  . Plantar fasciitis     Past Surgical History:  Procedure Laterality Date  . CHOLECYSTECTOMY      Family Psychiatric History: I have reviewed family psychiatric history from my progress note on 11/05/2018.  Family History:  Family History  Problem Relation Age of Onset  . High blood pressure Mother   . Depression Father   . Liver disease Father   . Alcoholism Father   . Alcohol abuse Father   . Diabetes Paternal Grandmother   . Bipolar disorder Maternal Aunt     Social History: I have reviewed social history from my progress note on 11/05/2018. Social History   Socioeconomic History  . Marital status: Married    Spouse name: [name]  . Number of children: 2  . Years of education: Not on file  . Highest education level: Bachelor's degree (e.g., BA, AB,  BS)  Occupational History  . Occupation: Therapist, sports  Tobacco Use  . Smoking status: Never Smoker  . Smokeless tobacco: Never Used  Vaping Use  . Vaping Use: Never used  Substance and Sexual Activity  . Alcohol use: No  . Drug use: No  . Sexual activity: Yes    Birth control/protection: None  Other Topics Concern  . Not on file  Social History Narrative  . Not on file   Social Determinants of Health   Financial Resource Strain:   . Difficulty of Paying Living Expenses: Not on file   Food Insecurity:   . Worried About Charity fundraiser in the Last Year: Not on file  . Ran Out of Food in the Last Year: Not on file  Transportation Needs:   . Lack of Transportation (Medical): Not on file  . Lack of Transportation (Non-Medical): Not on file  Physical Activity:   . Days of Exercise per Week: Not on file  . Minutes of Exercise per Session: Not on file  Stress:   . Feeling of Stress : Not on file  Social Connections:   . Frequency of Communication with Friends and Family: Not on file  . Frequency of Social Gatherings with Friends and Family: Not on file  . Attends Religious Services: Not on file  . Active Member of Clubs or Organizations: Not on file  . Attends Archivist Meetings: Not on file  . Marital Status: Not on file    Allergies: No Known Allergies  Metabolic Disorder Labs: Lab Results  Component Value Date   HGBA1C 5.2 01/25/2020   No results found for: PROLACTIN Lab Results  Component Value Date   CHOL 153 01/09/2018   TRIG 63 01/09/2018   HDL 43 01/09/2018   LDLCALC 97 01/09/2018   Lab Results  Component Value Date   TSH 1.470 01/09/2018    Therapeutic Level Labs: No results found for: LITHIUM No results found for: VALPROATE No components found for:  CBMZ  Current Medications: Current Outpatient Medications  Medication Sig Dispense Refill  . amoxicillin-clavulanate (AUGMENTIN) 875-125 MG tablet Take 1 tablet by mouth 2 (two) times daily. One po bid x 7 days 14 tablet 0  . buPROPion (WELLBUTRIN XL) 300 MG 24 hr tablet Take 1 tablet (300 mg total) by mouth daily. 90 tablet 0  . busPIRone (BUSPAR) 10 MG tablet Take 2 tablets (20 mg total) by mouth daily with breakfast. 180 tablet 0  . cyclobenzaprine (FLEXERIL) 5 MG tablet Take 5 mg by mouth at bedtime as needed.    . cyclobenzaprine (FLEXERIL) 5 MG tablet Take by mouth.    . doxepin (SINEQUAN) 10 MG capsule Take 1-2 capsules (10-20 mg total) by mouth at bedtime as needed. For  sleep 30 capsule 1  . ELDERBERRY PO Take by mouth.    . fluconazole (DIFLUCAN) 150 MG tablet Take by mouth.    . Insulin Pen Needle 32G X 4 MM MISC Use with saxenda daily 100 each 0  . levocetirizine (XYZAL) 5 MG tablet Take 5 mg by mouth every evening.    Marland Kitchen levonorgestrel (MIRENA) 20 MCG/24HR IUD 1 each by Intrauterine route once.    . Liraglutide -Weight Management (SAXENDA) 18 MG/3ML SOPN Inject 0.5 mLs (3 mg total) into the skin daily. 5 pen 0  . Melatonin 5 MG CHEW Chew 5 mg by mouth at bedtime.    . Multiple Vitamin (MULTIVITAMIN) tablet Take 1 tablet by mouth daily.    Marland Kitchen  Probiotic Product (PROBIOTIC-10 PO) Take by mouth.    . spironolactone (ALDACTONE) 100 MG tablet Take by mouth.    . Vitamin D, Ergocalciferol, (DRISDOL) 1.25 MG (50000 UNIT) CAPS capsule Take 1 capsule (50,000 Units total) by mouth every 7 (seven) days. 4 capsule 0   No current facility-administered medications for this visit.     Musculoskeletal: Strength & Muscle Tone: UTA Gait & Station: normal Patient leans: N/A  Psychiatric Specialty Exam: Review of Systems  Psychiatric/Behavioral: Positive for sleep disturbance.  All other systems reviewed and are negative.   There were no vitals taken for this visit.There is no height or weight on file to calculate BMI.  General Appearance: Casual  Eye Contact:  Fair  Speech:  Clear and Coherent  Volume:  Normal  Mood:  Anxious coping ok  Affect:  Congruent  Thought Process:  Goal Directed and Descriptions of Associations: Intact  Orientation:  Full (Time, Place, and Person)  Thought Content: Logical   Suicidal Thoughts:  No  Homicidal Thoughts:  No  Memory:  Immediate;   Fair Recent;   Fair Remote;   Fair  Judgement:  Fair  Insight:  Fair  Psychomotor Activity:  Normal  Concentration:  Concentration: Fair and Attention Span: Fair  Recall:  [employer] of Knowledge: Fair  Language: Fair  Akathisia:  No  Handed:  Right  AIMS (if indicated): UTA   Assets:  Communication Skills Desire for Improvement Housing Social Support  ADL's:  Intact  Cognition: WNL  Sleep:  Poor   Screenings: PHQ2-9     Office Visit from 01/09/2018 in [facility]  PHQ-2 Total Score 3  PHQ-9 Total Score 13       Assessment and Plan: EUPHEMIA LINGERFELT is a 34 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  She is biologically predisposed given her history of mental health problems in her family.  Patient with psychosocial stressors of being in school, job related stressors, relationship struggles and the current pandemic.  She is currently struggling with sleep.  Plan as noted below.  Plan MDD in remission Wellbutrin XL 300 mg p.o. daily BuSpar 10 mg p.o. twice daily. Patient currently takes it in divided dosage due to dizziness. Continue l-methylfolate as prescribed for folic acid conversion reduction.  GAD-improving BuSpar 10 mg p.o. twice daily  Insomnia-unstable Start doxepin 10 to 20 mg p.o. nightly as needed Discussed sleep hygiene techniques  [employer] testing results were reviewed while making medication changes.  Patient to continue CBT with Ms. Hiram Comber sun.  Follow-up in clinic in 4 weeks or sooner if needed.  I have spent atleast 20 minutes face to face with patient today. More than 50 % of the time was spent for preparing to see the patient ( e.g., review of test, records ),ordering medications and test ,psychoeducation and supportive psychotherapy and care coordination,as well as documenting clinical information in electronic health record. This note was generated in part or whole with voice recognition software. Voice recognition is usually quite accurate but there are transcription errors that can and very often do occur. I apologize for any typographical errors that were not detected and corrected.       Ursula Alert, MD 05/11/2020, 8:21  AM

## 2020-05-10 NOTE — Patient Instructions (Signed)
Doxepin capsules What is this medicine? DOXEPIN (DOX e pin) is used to treat depression and anxiety. This medicine may be used for other purposes; ask your health care provider or pharmacist if you have questions. COMMON BRAND NAME(S): Sinequan What should I tell my health care provider before I take this medicine? They need to know if you have any of these conditions:  bipolar disorder  difficulty passing urine  glaucoma  heart disease  if you frequently drink alcohol containing drinks  liver disease  lung or breathing disease, like asthma or sleep apnea  prostate trouble  schizophrenia  seizures  suicidal thoughts, plans, or attempt; a previous suicide attempt by you or a family member  an unusual or allergic reaction to doxepin, other medicines, foods, dyes, or preservatives  pregnant or trying to get pregnant  breast-feeding How should I use this medicine? Take this medicine by mouth with a glass of water. Follow the directions on the prescription label. Take your doses at regular intervals. Do not take your medicine more often than directed. Do not stop taking this medicine suddenly except upon the advice of your doctor. Stopping this medicine too quickly may cause serious side effects or your condition may worsen. A special MedGuide will be given to you by the pharmacist with each prescription and refill. Be sure to read this information carefully each time. Talk to your pediatrician regarding the use of this medicine in children. While this drug may be prescribed for children as young as 12 years for selected conditions, precautions do apply. Overdosage: If you think you have taken too much of this medicine contact a poison control center or emergency room at once. NOTE: This medicine is only for you. Do not share this medicine with others. What if I miss a dose? If you miss a dose, take it as soon as you can. If it is almost time for your next dose, take only that  dose. Do not take double or extra doses. What may interact with this medicine? Do not take this medicine with any of the following medications:  arsenic trioxide  certain medicines used to regulate abnormal heartbeat or to treat other heart conditions  cisapride  halofantrine  levomethadyl  linezolid  MAOIs like Carbex, Eldepryl, Marplan, Nardil, and Parnate  methylene blue  other medicines for mental depression  phenothiazines like perphenazine, thioridazine and chlorpromazine  pimozide  procarbazine  sparfloxacin  St. John's Wort This medicine may also interact with the following medications:  cimetidine  tolazamide  ziprasidone This list may not describe all possible interactions. Give your health care provider a list of all the medicines, herbs, non-prescription drugs, or dietary supplements you use. Also tell them if you smoke, drink alcohol, or use illegal drugs. Some items may interact with your medicine. What should I watch for while using this medicine? Visit your doctor or health care professional for regular checks on your progress. It can take several days before you feel the full effect of this medicine. If you have been taking this medicine regularly for some time, do not suddenly stop taking it. You must gradually reduce the dose or you may get severe side effects. Ask your doctor or health care professional for advice. Even after you stop taking this medicine it can still affect your body for several days. Patients and their families should watch out for new or worsening thoughts of suicide or depression. Also watch out for sudden changes in feelings such as feeling anxious, agitated, panicky,   irritable, hostile, aggressive, impulsive, severely restless, overly excited and hyperactive, or not being able to sleep. If this happens, especially at the beginning of treatment or after a change in dose, call your health care professional. You may get drowsy or  dizzy. Do not drive, use machinery, or do anything that needs mental alertness until you know how this medicine affects you. Do not stand or sit up quickly, especially if you are an older patient. This reduces the risk of dizzy or fainting spells. Alcohol may increase dizziness and drowsiness. Avoid alcoholic drinks. Do not treat yourself for coughs, colds, or allergies without asking your doctor or health care professional for advice. Some ingredients can increase possible side effects. Your mouth may get dry. Chewing sugarless gum or sucking hard candy, and drinking plenty of water may help. Contact your doctor if the problem does not go away or is severe. This medicine may cause dry eyes and blurred vision. If you wear contact lenses you may feel some discomfort. Lubricating drops may help. See your eye doctor if the problem does not go away or is severe. This medicine can make you more sensitive to the sun. Keep out of the sun. If you cannot avoid being in the sun, wear protective clothing and use sunscreen. Do not use sun lamps or tanning beds/booths. What side effects may I notice from receiving this medicine? Side effects that you should report to your doctor or health care professional as soon as possible:  allergic reactions like skin rash, itching or hives, swelling of the face, lips, or tongue  anxious  breathing problems  changes in vision  confusion  elevated mood, decreased need for sleep, racing thoughts, impulsive behavior  eye pain  fast, irregular heartbeat  feeling faint or lightheaded, falls  feeling agitated, angry, or irritable  fever with increased sweating  hallucination, loss of contact with reality  seizures  stiff muscles  suicidal thoughts or other mood changes  tingling, pain, or numbness in the feet or hands  trouble passing urine or change in the amount of urine  trouble sleeping  unusually weak or tired  vomiting  yellowing of the eyes  or skin Side effects that usually do not require medical attention (report to your doctor or health care professional if they continue or are bothersome):  change in sex drive or performance  change in appetite or weight  constipation  dizziness  dry mouth  nausea  tired  tremors  upset stomach This list may not describe all possible side effects. Call your doctor for medical advice about side effects. You may report side effects to FDA at 1-800-FDA-1088. Where should I keep my medicine? Keep out of the reach of children. Store at room temperature between 15 and 30 degrees C (59 and 86 degrees F). Throw away any unused medicine after the expiration date. NOTE: This sheet is a summary. It may not cover all possible information. If you have questions about this medicine, talk to your doctor, pharmacist, or health care provider.  2020 Elsevier/Gold Standard (2018-07-29 13:13:29)  

## 2020-05-11 ENCOUNTER — Ambulatory Visit (INDEPENDENT_AMBULATORY_CARE_PROVIDER_SITE_OTHER): Payer: No Typology Code available for payment source | Admitting: Family Medicine

## 2020-05-11 ENCOUNTER — Encounter (INDEPENDENT_AMBULATORY_CARE_PROVIDER_SITE_OTHER): Payer: Self-pay | Admitting: Family Medicine

## 2020-05-11 VITALS — BP 120/76 | HR 73 | Temp 98.2°F | Ht 70.0 in | Wt 253.0 lb

## 2020-05-11 DIAGNOSIS — Z6836 Body mass index (BMI) 36.0-36.9, adult: Secondary | ICD-10-CM

## 2020-05-11 DIAGNOSIS — E559 Vitamin D deficiency, unspecified: Secondary | ICD-10-CM | POA: Diagnosis not present

## 2020-05-11 DIAGNOSIS — F3289 Other specified depressive episodes: Secondary | ICD-10-CM

## 2020-05-11 MED ORDER — VITAMIN D (ERGOCALCIFEROL) 1.25 MG (50000 UNIT) PO CAPS: 50000.0000 [IU] | ORAL_CAPSULE | ORAL | 0 refills | Status: DC

## 2020-05-12 ENCOUNTER — Encounter (INDEPENDENT_AMBULATORY_CARE_PROVIDER_SITE_OTHER): Payer: Self-pay | Admitting: Family Medicine

## 2020-05-12 NOTE — Progress Notes (Signed)
Chief Complaint:   OBESITY Rhonda [name] is here to discuss her progress with her obesity treatment plan along with follow-up of her obesity related diagnoses. Rhonda [name] is on the Category 2 Plan and states she is following her eating plan approximately 75% of the time. [name] states she is doing 0 minutes 0 times per week.  Today's visit was #: 77 Starting weight: 273 lbs Starting date: 01/09/2018 Today's weight: 253 lbs Today's date: 05/11/2020 Total lbs lost to date: 20 Total lbs lost since last in-office visit: 0  Interim History: [name] feels Rhonda [name] suppresses her appetite at 1.2 mg daily. She feels she is making some poor food choices but is unsure why. She does not feel she always gets enough protein. She often deviates from plan at lunch due to lack of planning and meal prep. She says she is bored with lunch options.   Subjective:   1. Vitamin D deficiency Rhonda [name]'s Vit D level is almost at goal. She is on weekly Vit D prescription.  2. Other depression with emotional eating Rhonda [name] tends to struggle with emotional eating. She is on 300 mg bupropion XL and Buspar.  Assessment/Plan:   1. Vitamin D deficiency We will refill prescription Vitamin D for 1 month.  - Vitamin D, Ergocalciferol, (DRISDOL) 1.25 MG (50000 UNIT) CAPS capsule; Take 1 capsule (50,000 Units total) by mouth every 7 (seven) days.  Dispense: 4 capsule; Refill: 0  2. Other depression with emotional eating Rhonda [name] will increase Saxenda dose to see if this helps, and will continue bupropion and Buspar.   3. Class 2 severe obesity with serious comorbidity and body mass index (BMI) of 36.0 to 36.9 in adult, unspecified obesity type [facility]) [place] is currently in the action stage of change. As such, her goal is to continue with weight loss efforts. She has agreed to the Category 2 Plan.   We discussed lunch ideas.  We discussed various medication options to help Rhonda [name] with her weight loss efforts and we both  agreed to increase Saxenda to 1.8 mg daily.  Exercise goals: No exercise has been prescribed at this time.  Behavioral modification strategies: increasing lean protein intake, decreasing simple carbohydrates and meal planning and cooking strategies.  Rhonda [name] has agreed to follow-up with our clinic in 3 to 4 weeks.  Objective:   Blood pressure 120/76, pulse 73, temperature 98.2 F (36.8 C), temperature source Oral, height 5\' 10"  (1.778 m), weight 253 lb (114.8 kg), SpO2 99 %. Body mass index is 36.3 kg/m.  General: Cooperative, alert, well developed, in no acute distress. HEENT: Conjunctivae and lids unremarkable. Cardiovascular: Regular rhythm.  Lungs: Normal work of breathing. Neurologic: No focal deficits.   Lab Results  Component Value Date   CREATININE 1.04 (H) 01/25/2020   BUN 11 01/25/2020   NA 141 01/25/2020   K 4.3 01/25/2020   CL 105 01/25/2020   CO2 20 01/25/2020   Lab Results  Component Value Date   ALT 44 (H) 01/25/2020   AST 30 01/25/2020   ALKPHOS 74 01/25/2020   BILITOT 0.4 01/25/2020   Lab Results  Component Value Date   HGBA1C 5.2 01/25/2020   HGBA1C 5.2 09/21/2019   HGBA1C 5.1 04/20/2019   HGBA1C 5.2 10/09/2018   HGBA1C 5.2 01/09/2018   Lab Results  Component Value Date   INSULIN 9.9 01/25/2020   INSULIN 20.3 09/21/2019   INSULIN 13.8 04/20/2019   INSULIN 13.0 10/09/2018   INSULIN 16.5 01/09/2018   Lab Results  Component Value  Date   TSH 1.470 01/09/2018   Lab Results  Component Value Date   CHOL 153 01/09/2018   HDL 43 01/09/2018   LDLCALC 97 01/09/2018   TRIG 63 01/09/2018   Lab Results  Component Value Date   WBC 4.7 01/09/2018   HGB 14.5 01/09/2018   HCT 42.1 01/09/2018   MCV 89 01/09/2018   PLT 195 09/20/2016   No results found for: IRON, TIBC, FERRITIN  Attestation Statements:   Reviewed by clinician on day of visit: allergies, medications, problem list, medical history, surgical history, family history, social  history, and previous encounter notes.   Wilhemena Durie, am acting as Location manager for [employer], FNP-C.  I have reviewed the above documentation for accuracy and completeness, and I agree with the above. -  Georgianne Fick, FNP

## 2020-05-27 ENCOUNTER — Other Ambulatory Visit: Payer: Self-pay | Admitting: Student

## 2020-06-02 ENCOUNTER — Ambulatory Visit (INDEPENDENT_AMBULATORY_CARE_PROVIDER_SITE_OTHER): Payer: No Typology Code available for payment source | Admitting: Family Medicine

## 2020-06-02 ENCOUNTER — Other Ambulatory Visit (INDEPENDENT_AMBULATORY_CARE_PROVIDER_SITE_OTHER): Payer: Self-pay | Admitting: Family Medicine

## 2020-06-02 ENCOUNTER — Other Ambulatory Visit: Payer: Self-pay

## 2020-06-02 ENCOUNTER — Encounter (INDEPENDENT_AMBULATORY_CARE_PROVIDER_SITE_OTHER): Payer: Self-pay | Admitting: Family Medicine

## 2020-06-02 VITALS — BP 130/81 | HR 77 | Temp 98.6°F | Ht 70.0 in | Wt 247.0 lb

## 2020-06-02 DIAGNOSIS — E559 Vitamin D deficiency, unspecified: Secondary | ICD-10-CM | POA: Diagnosis not present

## 2020-06-02 DIAGNOSIS — F3289 Other specified depressive episodes: Secondary | ICD-10-CM | POA: Diagnosis not present

## 2020-06-02 DIAGNOSIS — Z6835 Body mass index (BMI) 35.0-35.9, adult: Secondary | ICD-10-CM | POA: Diagnosis not present

## 2020-06-02 MED ORDER — VITAMIN D (ERGOCALCIFEROL) 1.25 MG (50000 UNIT) PO CAPS: 50000.0000 [IU] | ORAL_CAPSULE | ORAL | 0 refills | Status: DC

## 2020-06-02 MED ORDER — INSULIN PEN NEEDLE 32G X 4 MM MISC: 0 refills | Status: DC

## 2020-06-02 MED ORDER — SAXENDA 18 MG/3ML ~~LOC~~ SOPN: 3.0000 mg | PEN_INJECTOR | Freq: Every day | SUBCUTANEOUS | 0 refills | Status: DC

## 2020-06-06 NOTE — Progress Notes (Signed)
Chief Complaint:   OBESITY Rhonda [name] is here to discuss her progress with her obesity treatment plan along with follow-up of her obesity related diagnoses. Rhonda [name] is on the Category 2 Plan and states she is following her eating plan approximately 75% of the time. [name] states she is exercising 0 minutes 0 times per week.  Today's visit was #: 74 Starting weight: 273 lbs Starting date: 01/09/2018 Today's weight: 247 lbs Today's date: 06/02/2020 Total lbs lost to date: 26 Total lbs lost since last in-office visit: 6  Interim History: Rhonda [name]'s dose of Saxenda was increased to 1.8 mg at her last office visit. She increased the dose to 2.4 mg on her own and notes it is working very well. She was able to adhere to the plan well.  Her goal weight is 199 (28 BMI).  Subjective:   Vitamin D deficiency. Last Vitamin D level was nearly at goal at 46.7 on 01/25/2020. Briannia is on prescription Vitamin D supplementation.    Ref. Range 01/25/2020 08:32  Vitamin D, 25-Hydroxy Latest Ref Range: 30.0 - 100.0 ng/mL 46.7   Other depression with emotional eating. Rhonda [name] Comment feels cravings are well controlled likely due to Saxenda. She is on bupropion through her psychiatrist as well as Buspar.  Assessment/Plan:   Vitamin D deficiency. She was given a refill on her Vitamin D, Ergocalciferol, (DRISDOL) 1.25 MG (50000 UNIT) CAPS capsule every week #4 with 0 refills.  Other depression with emotional eating.  She will follow-up with Psychiatry as directed.   Class 2 severe obesity with serious comorbidity and body mass index (BMI) of 35.0 to 35.9 in adult, unspecified obesity type ([place]). Refills were given for Liraglutide -Weight Management (SAXENDA) 18 MG/3ML SOPN 3.0 mg SQ daily #3 pens with 0 refills (will continue 2.4 mg daily) and Insulin Pen Needle 32G X 4 MM MISC #100 with 0 refills.  Rhonda [name] is currently in the action stage of change. As such, her goal is to continue with weight loss  efforts. She has agreed to the Category 2 Plan.   Exercise goals: No exercise has been prescribed at this time.  Behavioral modification strategies: planning for success.  Rhonda [name] has agreed to follow-up with our clinic in 3 weeks.   Objective:   Blood pressure 130/81, pulse 77, temperature 98.6 F (37 C), height 5\' 10"  (1.778 m), weight 247 lb (112 kg), SpO2 95 %. Body mass index is 35.44 kg/m.  General: Cooperative, alert, well developed, in no acute distress. HEENT: Conjunctivae and lids unremarkable. Cardiovascular: Regular rhythm.  Lungs: Normal work of breathing. Neurologic: No focal deficits.   Lab Results  Component Value Date   CREATININE 1.04 (H) 01/25/2020   BUN 11 01/25/2020   NA 141 01/25/2020   K 4.3 01/25/2020   CL 105 01/25/2020   CO2 20 01/25/2020   Lab Results  Component Value Date   ALT 44 (H) 01/25/2020   AST 30 01/25/2020   ALKPHOS 74 01/25/2020   BILITOT 0.4 01/25/2020   Lab Results  Component Value Date   HGBA1C 5.2 01/25/2020   HGBA1C 5.2 09/21/2019   HGBA1C 5.1 04/20/2019   HGBA1C 5.2 10/09/2018   HGBA1C 5.2 01/09/2018   Lab Results  Component Value Date   INSULIN 9.9 01/25/2020   INSULIN 20.3 09/21/2019   INSULIN 13.8 04/20/2019   INSULIN 13.0 10/09/2018   INSULIN 16.5 01/09/2018   Lab Results  Component Value Date   TSH 1.470 01/09/2018   Lab Results  Component Value Date   CHOL 153 01/09/2018   HDL 43 01/09/2018   LDLCALC 97 01/09/2018   TRIG 63 01/09/2018   Lab Results  Component Value Date   WBC 4.7 01/09/2018   HGB 14.5 01/09/2018   HCT 42.1 01/09/2018   MCV 89 01/09/2018   PLT 195 09/20/2016   No results found for: IRON, TIBC, FERRITIN  Attestation Statements:   Reviewed by clinician on day of visit: allergies, medications, problem list, medical history, surgical history, family history, social history, and previous encounter notes.  IMichaelene Song, am acting as Location manager for [employer], FNP-C    I have reviewed the above documentation for accuracy and completeness, and I agree with the above. -  Georgianne Fick, FNP

## 2020-06-07 ENCOUNTER — Encounter (INDEPENDENT_AMBULATORY_CARE_PROVIDER_SITE_OTHER): Payer: Self-pay | Admitting: Family Medicine

## 2020-06-16 ENCOUNTER — Encounter: Payer: Self-pay | Admitting: Psychiatry

## 2020-06-16 ENCOUNTER — Telehealth (INDEPENDENT_AMBULATORY_CARE_PROVIDER_SITE_OTHER): Payer: No Typology Code available for payment source | Admitting: Psychiatry

## 2020-06-16 ENCOUNTER — Other Ambulatory Visit: Payer: Self-pay

## 2020-06-16 DIAGNOSIS — F5105 Insomnia due to other mental disorder: Secondary | ICD-10-CM | POA: Diagnosis not present

## 2020-06-16 DIAGNOSIS — F3342 Major depressive disorder, recurrent, in full remission: Secondary | ICD-10-CM | POA: Diagnosis not present

## 2020-06-16 DIAGNOSIS — F411 Generalized anxiety disorder: Secondary | ICD-10-CM

## 2020-06-16 MED ORDER — BUSPIRONE HCL 10 MG PO TABS: 20.0000 mg | ORAL_TABLET | Freq: Every day | ORAL | 0 refills | Status: DC

## 2020-06-16 NOTE — Progress Notes (Signed)
Virtual Visit via Video [name]  I connected with Rhonda [name] on 06/16/20 at  3:40 PM EDT by a video enabled telemedicine application and verified that I am speaking with the correct person using two identifiers.  Location Provider Location : [facility] Patient Location : Home  Participants: Patient , Provider    I discussed the limitations of evaluation and management by telemedicine and the availability of in person appointments. The patient expressed understanding and agreed to proceed.    I discussed the assessment and treatment plan with the patient. The patient was provided an opportunity to ask questions and all were answered. The patient agreed with the plan and demonstrated an understanding of the instructions.   The patient was advised to call back or seek an in-person evaluation if the symptoms worsen or if the condition fails to improve as anticipated.   Rhonda [name]  06/16/2020 3:57 PM Rhonda [name]  Chief Complaint:  Chief Complaint    Follow-up     HPI: Rhonda [name] is a 34 year old Caucasian female, married, employed, has a history of depression, GAD, insomnia was evaluated by telemedicine today.  Patient today reports she is currently sleeping better.  She reports she cannot take the doxepin every night since she has to stay up some nights doing her homework and other stuff.  She also has to wake up early in the morning around 5 AM.  She reports she has takes it only on the days that she can sleep in and wake up late.  She hence does it on weekends.  She reports it has definitely helped her to stay asleep.  Patient denies any side effects to her medications.  She does report situational stressors of going through separation with her husband.  She reports they are planning to sell their house and live separate while they coparent.  She is currently in psychotherapy sessions and reports good support system from her  friends.  Patient denies any suicidality, homicidality or perceptual disturbances.  Patient denies any other concerns today.  Visit Diagnosis:    ICD-10-CM   1. MDD (major depressive disorder), recurrent, in full remission ([place])  F33.42 busPIRone (BUSPAR) 10 MG tablet  2. GAD (generalized anxiety disorder)  F41.1   3. Insomnia due to mental condition  F51.05     Past Psychiatric History: I have reviewed past psychiatric history from my progress [name] on 11/05/2018.  Past Medical History:  Past Medical History:  Diagnosis Date  . Depression    treated  . PCOS (polycystic ovarian syndrome) 2008  . Plantar fasciitis     Past Surgical History:  Procedure Laterality Date  . CHOLECYSTECTOMY      Family Psychiatric History: I have reviewed family psychiatric history from my progress [name] on 11/05/2018.  Family History:  Family History  Problem Relation Age of Onset  . High blood pressure Mother   . Depression Father   . Liver disease Father   . Alcoholism Father   . Alcohol abuse Father   . Diabetes Paternal Grandmother   . Bipolar disorder Maternal Aunt     Social History: I have reviewed social history from my progress [name] on 11/05/2018. Social History   Socioeconomic History  . Marital status: Married    Spouse name: [name]  . Number of children: 2  . Years of education: Not on file  . Highest education level: Bachelor's degree (e.g., BA, AB, BS)  Occupational History  .  Occupation: Therapist, sports  Tobacco Use  . Smoking status: Never Smoker  . Smokeless tobacco: Never Used  Vaping Use  . Vaping Use: Never used  Substance and Sexual Activity  . Alcohol use: No  . Drug use: No  . Sexual activity: Yes    Birth control/protection: None  Other Topics Concern  . Not on file  Social History Narrative  . Not on file   Social Determinants of Health   Financial Resource Strain:   . Difficulty of Paying Living Expenses: Not on file  Food Insecurity:   . Worried About  Charity fundraiser in the Last Year: Not on file  . Ran Out of Food in the Last Year: Not on file  Transportation Needs:   . Lack of Transportation (Medical): Not on file  . Lack of Transportation (Non-Medical): Not on file  Physical Activity:   . Days of Exercise per Week: Not on file  . Minutes of Exercise per Session: Not on file  Stress:   . Feeling of Stress : Not on file  Social Connections:   . Frequency of Communication with Friends and Family: Not on file  . Frequency of Social Gatherings with Friends and Family: Not on file  . Attends Religious Services: Not on file  . Active Member of Clubs or Organizations: Not on file  . Attends Archivist Meetings: Not on file  . Marital Status: Not on file    Allergies: No Known Allergies  Metabolic Disorder Labs: Lab Results  Component Value Date   HGBA1C 5.2 01/25/2020   No results found for: PROLACTIN Lab Results  Component Value Date   CHOL 153 01/09/2018   TRIG 63 01/09/2018   HDL 43 01/09/2018   LDLCALC 97 01/09/2018   Lab Results  Component Value Date   TSH 1.470 01/09/2018    Therapeutic Level Labs: No results found for: LITHIUM No results found for: VALPROATE No components found for:  CBMZ  Current Medications: Current Outpatient Medications  Medication Sig Dispense Refill  . buPROPion (WELLBUTRIN XL) 300 MG 24 hr tablet Take 1 tablet (300 mg total) by mouth daily. 90 tablet 0  . busPIRone (BUSPAR) 10 MG tablet Take 2 tablets (20 mg total) by mouth daily with breakfast. 180 tablet 0  . cyclobenzaprine (FLEXERIL) 5 MG tablet Take 5 mg by mouth at bedtime as needed.    . cyclobenzaprine (FLEXERIL) 5 MG tablet Take by mouth.    . doxepin (SINEQUAN) 10 MG capsule Take 1-2 capsules (10-20 mg total) by mouth at bedtime as needed. For sleep 30 capsule 1  . ELDERBERRY PO Take by mouth.    . fluconazole (DIFLUCAN) 150 MG tablet Take by mouth.    . Insulin Pen Needle 32G X 4 MM MISC Use with saxenda daily  100 each 0  . levocetirizine (XYZAL) 5 MG tablet Take 5 mg by mouth every evening.    Marland Kitchen levonorgestrel (MIRENA) 20 MCG/24HR IUD 1 each by Intrauterine route once.    . Liraglutide -Weight Management (SAXENDA) 18 MG/3ML SOPN Inject 3 mg into the skin daily. 3 mL 0  . Melatonin 5 MG CHEW Chew 5 mg by mouth at bedtime.    . Multiple Vitamin (MULTIVITAMIN) tablet Take 1 tablet by mouth daily.    . Probiotic Product (PROBIOTIC-10 PO) Take by mouth.    . spironolactone (ALDACTONE) 100 MG tablet Take by mouth.    . Vitamin D, Ergocalciferol, (DRISDOL) 1.25 MG (50000 UNIT) CAPS capsule Take  1 capsule (50,000 Units total) by mouth every 7 (seven) days. 4 capsule 0   No current facility-administered medications for this visit.     Musculoskeletal: Strength & Muscle Tone: UTA Gait & Station: normal Patient leans: N/A  Psychiatric Specialty Exam: Review of Systems  Psychiatric/Behavioral: The patient is nervous/anxious.   All other systems reviewed and are negative.   There were no vitals taken for this visit.There is no height or weight on file to calculate BMI.  General Appearance: Casual  Eye Contact:  Fair  Speech:  Clear and Coherent  Volume:  Normal  Mood:  Anxious coping OK  Affect:  Congruent  Thought Process:  Goal Directed and Descriptions of Associations: Intact  Orientation:  Full (Time, Place, and Person)  Thought Content: Logical   Suicidal Thoughts:  No  Homicidal Thoughts:  No  Memory:  Immediate;   Fair Recent;   Fair Remote;   Fair  Judgement:  Fair  Insight:  Fair  Psychomotor Activity:  Normal  Concentration:  Concentration: Fair and Attention Span: Fair  Recall:  [employer] of Knowledge: Fair  Language: Fair  Akathisia:  No  Handed:  Right  AIMS (if indicated): UTA  Assets:  Communication Skills Desire for Improvement Housing Social Support  ADL's:  Intact  Cognition: WNL  Sleep:  Improving   Screenings: PHQ2-9     Office Visit from 01/09/2018 in  [place]  PHQ-2 Total Score 3  PHQ-9 Total Score 13       Assessment and Plan: AJEE HEASLEY is a 34 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  Patient is biologically predisposed given her family history of mental health problems.  Patient with psychosocial stressors of being in school, job related stressors, relationship struggles and the current pandemic.  Patient however is currently making progress and reports sleep is better on the doxepin.  Plan as noted below.  Plan MDD in remission Wellbutrin XL 300 mg p.o. daily BuSpar 10 mg p.o. twice daily Continue l-methylfolate as prescribed for folic acid conversion reaction.  GAD-improving BuSpar as prescribed  Insomnia-improving Doxepin 10 to 20 mg p.o. nightly as needed Patient to continue sleep hygiene techniques  Continue CBT with Ms. Raelyn Ensign  Reviewed Gene[employer] testing results while making medication changes.  Follow-up in clinic in 6 to 8 weeks or sooner if needed.  I have spent atleast 20 minutes face to face by video with patient today. More than 50 % of the time was spent for preparing to see the patient ( e.g., review of test, records ), ordering medications and test ,psychoeducation and supportive psychotherapy and care coordination,as well as documenting clinical information in electronic health record. This [name] was generated in part or whole with voice recognition software. Voice recognition is usually quite accurate but there are transcription errors that can and very often do occur. I apologize for any typographical errors that were not detected and corrected.      Ursula Alert, MD 06/17/2020, 8:07 AM

## 2020-06-17 ENCOUNTER — Telehealth: Payer: No Typology Code available for payment source | Admitting: Emergency Medicine

## 2020-06-17 ENCOUNTER — Other Ambulatory Visit: Payer: Self-pay | Admitting: Emergency Medicine

## 2020-06-17 DIAGNOSIS — R21 Rash and other nonspecific skin eruption: Secondary | ICD-10-CM | POA: Diagnosis not present

## 2020-06-17 MED ORDER — KETOCONAZOLE 2 % EX CREA: 1.0000 "application " | TOPICAL_CREAM | Freq: Every day | CUTANEOUS | 0 refills | Status: DC

## 2020-06-17 MED ORDER — TRIAMCINOLONE ACETONIDE 0.1 % EX CREA: 1.0000 "application " | TOPICAL_CREAM | Freq: Two times a day (BID) | CUTANEOUS | 0 refills | Status: DC | PRN

## 2020-06-17 NOTE — Progress Notes (Signed)
E Visit for Rash  **Please do not respond to this message unless you have follow up questions.**   We are sorry that you are not feeling well. Here is how we plan to help!   Based upon your presentation it appears you have a fungal infection.  I have prescribed: Ketoconazole cream - apply daily for 14 days Kenalog cream- apply twice a day for itching  HOME CARE:   Take cool showers and avoid direct sunlight.  Apply cool compress or wet dressings.  Take a bath in an oatmeal bath.  Sprinkle content of one Aveeno packet under running faucet with comfortably warm water.  Bathe for 15-20 minutes, 1-2 times daily.  Pat dry with a towel. Do not rub the rash.  Use hydrocortisone cream.  Take an antihistamine like Benadryl for widespread rashes that itch.  The adult dose of Benadryl is 25-50 mg by mouth 4 times daily.  Caution:  This type of medication may cause sleepiness.  Do not drink alcohol, drive, or operate dangerous machinery while taking antihistamines.  Do not take these medications if you have prostate enlargement.  Read package instructions thoroughly on all medications that you take.  GET HELP RIGHT AWAY IF:   Symptoms don't go away after treatment.  Severe itching that persists.  If you rash spreads or swells.  If you rash begins to smell.  If it blisters and opens or develops a yellow-brown crust.  You develop a fever.  You have a sore throat.  You become short of breath.  MAKE SURE YOU:  Understand these instructions. Will watch your condition. Will get help right away if you are not doing well or get worse.  Thank you for choosing an e-visit. Your e-visit answers were reviewed by a board certified advanced clinical practitioner to complete your personal care plan. Depending upon the condition, your plan could have included both over the counter or prescription medications. Please review your pharmacy choice. Be sure that the pharmacy you have chosen is  open so that you can pick up your prescription now.  If there is a problem you may message your provider in [place] to have the prescription routed to another pharmacy. Your safety is important to [name]. If you have drug allergies check your prescription carefully.  For the next 24 hours, you can use MyChart to ask questions about today's visit, request a non-urgent call back, or ask for a work or school excuse from your e-visit provider. You will get an email in the next two days asking about your experience. I hope that your e-visit has been valuable and will speed your recovery.      Greater than 5 but less than 10 minutes spent researching, coordinating, and implementing care for this patient today

## 2020-06-23 ENCOUNTER — Other Ambulatory Visit: Payer: Self-pay

## 2020-06-23 ENCOUNTER — Encounter (INDEPENDENT_AMBULATORY_CARE_PROVIDER_SITE_OTHER): Payer: Self-pay | Admitting: Family Medicine

## 2020-06-23 ENCOUNTER — Other Ambulatory Visit (INDEPENDENT_AMBULATORY_CARE_PROVIDER_SITE_OTHER): Payer: Self-pay | Admitting: Family Medicine

## 2020-06-23 ENCOUNTER — Ambulatory Visit (INDEPENDENT_AMBULATORY_CARE_PROVIDER_SITE_OTHER): Payer: No Typology Code available for payment source | Admitting: Family Medicine

## 2020-06-23 VITALS — BP 124/78 | HR 91 | Temp 98.0°F | Ht 70.0 in | Wt 244.0 lb

## 2020-06-23 DIAGNOSIS — E8881 Metabolic syndrome: Secondary | ICD-10-CM

## 2020-06-23 DIAGNOSIS — E559 Vitamin D deficiency, unspecified: Secondary | ICD-10-CM | POA: Diagnosis not present

## 2020-06-23 DIAGNOSIS — Z6835 Body mass index (BMI) 35.0-35.9, adult: Secondary | ICD-10-CM

## 2020-06-23 MED ORDER — VITAMIN D (ERGOCALCIFEROL) 1.25 MG (50000 UNIT) PO CAPS: 50000.0000 [IU] | ORAL_CAPSULE | ORAL | 0 refills | Status: DC

## 2020-06-23 MED ORDER — SAXENDA 18 MG/3ML ~~LOC~~ SOPN: 3.0000 mg | PEN_INJECTOR | Freq: Every day | SUBCUTANEOUS | 0 refills | Status: DC

## 2020-06-24 ENCOUNTER — Encounter (INDEPENDENT_AMBULATORY_CARE_PROVIDER_SITE_OTHER): Payer: Self-pay | Admitting: Family Medicine

## 2020-06-24 DIAGNOSIS — E88819 Insulin resistance, unspecified: Secondary | ICD-10-CM

## 2020-06-24 DIAGNOSIS — E559 Vitamin D deficiency, unspecified: Secondary | ICD-10-CM

## 2020-06-24 DIAGNOSIS — E8881 Metabolic syndrome: Secondary | ICD-10-CM

## 2020-06-27 NOTE — Progress Notes (Signed)
Chief Complaint:   OBESITY Rhonda [name] is here to discuss her progress with her obesity treatment plan along with follow-up of her obesity related diagnoses. Loralyn is on the Category 2 Plan and states she is following her eating plan approximately 75% of the time. Charnee states she is doing 0 minutes 0 times per week.  Today's visit was #: 50 Starting weight: 273 lbs Starting date: 01/09/2018 Today's weight: 244 lbs Today's date: 06/23/2020 Total lbs lost to date: 29 Total lbs lost since last in-office visit: 3  Interim History: Rhonda [name] is on 2.4 mg Saxenda daily and tolerating it well. She notes increased satiety and appetite suppression. She is down 3 lbs today. She is able to portion control sweets at work because of [name].  Subjective:   1. Insulin resistance Rhonda [name] denies polyphagia, and she is on Saxenda.  2. Vitamin D deficiency Rhonda [name]'s Vit D level is nearly at goal at 46.7. She is on weekly prescription Vit D.  Assessment/Plan:   1. Insulin resistance Rhonda [name] will continue Saxenda at 2.4 mg daily.  2. Vitamin D deficiency We will refill prescription Vitamin D for 1 month, and we will recheck labs at her next office visit.  - Vitamin D, Ergocalciferol, (DRISDOL) 1.25 MG (50000 UNIT) CAPS capsule; Take 1 capsule (50,000 Units total) by mouth every 7 (seven) days.  Dispense: 4 capsule; Refill: 0  3. Class 2 severe obesity with serious comorbidity and body mass index (BMI) of 35.0 to 35.9 in adult, unspecified obesity type [facility]) Rhonda [name] is currently in the action stage of change. As such, her goal is to continue with weight loss efforts. She has agreed to the Category 2 Plan.   We both agreed to continue Saxenda 2.4 mg daily, and we will refill for 1 month.  - Liraglutide -Weight Management (SAXENDA) 18 MG/3ML SOPN; Inject 3 mg into the skin daily.  Dispense: 3 mL; Refill: 0  Exercise goals: No exercise has been prescribed at this time. She feels she doesn't have  time to exercise.  Behavioral modification strategies: increasing lean protein intake, decreasing simple carbohydrates and planning for success.  Rhonda [name] has agreed to follow-up with our clinic in 4 weeks.   Objective:   Blood pressure 124/78, pulse 91, temperature 98 F (36.7 C), height 5\' 10"  (1.778 m), weight 244 lb (110.7 kg), SpO2 100 %. Body mass index is 35.01 kg/m.  General: Cooperative, alert, well developed, in no acute distress. HEENT: Conjunctivae and lids unremarkable. Cardiovascular: Regular rhythm.  Lungs: Normal work of breathing. Neurologic: No focal deficits.   Lab Results  Component Value Date   CREATININE 1.04 (H) 01/25/2020   BUN 11 01/25/2020   NA 141 01/25/2020   K 4.3 01/25/2020   CL 105 01/25/2020   CO2 20 01/25/2020   Lab Results  Component Value Date   ALT 44 (H) 01/25/2020   AST 30 01/25/2020   ALKPHOS 74 01/25/2020   BILITOT 0.4 01/25/2020   Lab Results  Component Value Date   HGBA1C 5.2 01/25/2020   HGBA1C 5.2 09/21/2019   HGBA1C 5.1 04/20/2019   HGBA1C 5.2 10/09/2018   HGBA1C 5.2 01/09/2018   Lab Results  Component Value Date   INSULIN 9.9 01/25/2020   INSULIN 20.3 09/21/2019   INSULIN 13.8 04/20/2019   INSULIN 13.0 10/09/2018   INSULIN 16.5 01/09/2018   Lab Results  Component Value Date   TSH 1.470 01/09/2018   Lab Results  Component Value Date   CHOL 153 01/09/2018  HDL 43 01/09/2018   LDLCALC 97 01/09/2018   TRIG 63 01/09/2018   Lab Results  Component Value Date   WBC 4.7 01/09/2018   HGB 14.5 01/09/2018   HCT 42.1 01/09/2018   MCV 89 01/09/2018   PLT 195 09/20/2016   No results found for: IRON, TIBC, FERRITIN  Attestation Statements:   Reviewed by clinician on day of visit: allergies, medications, problem list, medical history, surgical history, family history, social history, and previous encounter notes.   Wilhemena Durie, am acting as Location manager for [employer], FNP-C.  I have reviewed the  above documentation for accuracy and completeness, and I agree with the above. -  Georgianne Fick, FNP

## 2020-06-28 ENCOUNTER — Encounter (INDEPENDENT_AMBULATORY_CARE_PROVIDER_SITE_OTHER): Payer: Self-pay | Admitting: Family Medicine

## 2020-07-20 LAB — HEMOGLOBIN A1C
Est. average glucose Bld gHb Est-mCnc: 97 mg/dL
Hgb A1c MFr Bld: 5 % (ref 4.8–5.6)

## 2020-07-20 LAB — COMPREHENSIVE METABOLIC PANEL
ALT: 52 IU/L — ABNORMAL HIGH (ref 0–32)
AST: 37 IU/L (ref 0–40)
Albumin/Globulin Ratio: 1.6 (ref 1.2–2.2)
Albumin: 4.7 g/dL (ref 3.8–4.8)
Alkaline Phosphatase: 68 IU/L (ref 44–121)
BUN/Creatinine Ratio: 11 (ref 9–23)
BUN: 12 mg/dL (ref 6–20)
Bilirubin Total: 0.4 mg/dL (ref 0.0–1.2)
CO2: 22 mmol/L (ref 20–29)
Calcium: 9.7 mg/dL (ref 8.7–10.2)
Chloride: 101 mmol/L (ref 96–106)
Creatinine, Ser: 1.09 mg/dL — ABNORMAL HIGH (ref 0.57–1.00)
GFR calc Af Amer: 77 mL/min/{1.73_m2} (ref >59)
GFR calc non Af Amer: 66 mL/min/{1.73_m2} (ref >59)
Globulin, Total: 2.9 g/dL (ref 1.5–4.5)
Glucose: 78 mg/dL (ref 65–99)
Potassium: 4.6 mmol/L (ref 3.5–5.2)
Sodium: 137 mmol/L (ref 134–144)
Total Protein: 7.6 g/dL (ref 6.0–8.5)

## 2020-07-20 LAB — INSULIN, RANDOM: INSULIN: 34 u[IU]/mL — ABNORMAL HIGH (ref 2.6–24.9)

## 2020-07-20 LAB — VITAMIN D 25 HYDROXY (VIT D DEFICIENCY, FRACTURES): Vit D, 25-Hydroxy: 44.3 ng/mL (ref 30.0–100.0)

## 2020-07-21 ENCOUNTER — Ambulatory Visit (INDEPENDENT_AMBULATORY_CARE_PROVIDER_SITE_OTHER): Payer: No Typology Code available for payment source | Admitting: Family Medicine

## 2020-08-01 ENCOUNTER — Other Ambulatory Visit (INDEPENDENT_AMBULATORY_CARE_PROVIDER_SITE_OTHER): Payer: Self-pay | Admitting: Family Medicine

## 2020-08-01 ENCOUNTER — Encounter (INDEPENDENT_AMBULATORY_CARE_PROVIDER_SITE_OTHER): Payer: Self-pay | Admitting: Family Medicine

## 2020-08-01 ENCOUNTER — Ambulatory Visit (INDEPENDENT_AMBULATORY_CARE_PROVIDER_SITE_OTHER): Payer: No Typology Code available for payment source | Admitting: Family Medicine

## 2020-08-01 ENCOUNTER — Other Ambulatory Visit: Payer: Self-pay

## 2020-08-01 VITALS — BP 121/76 | HR 77 | Temp 97.8°F | Ht 70.0 in | Wt 247.0 lb

## 2020-08-01 DIAGNOSIS — Z6835 Body mass index (BMI) 35.0-35.9, adult: Secondary | ICD-10-CM | POA: Diagnosis not present

## 2020-08-01 DIAGNOSIS — E559 Vitamin D deficiency, unspecified: Secondary | ICD-10-CM | POA: Diagnosis not present

## 2020-08-01 DIAGNOSIS — R748 Abnormal levels of other serum enzymes: Secondary | ICD-10-CM | POA: Diagnosis not present

## 2020-08-01 MED ORDER — VITAMIN D (ERGOCALCIFEROL) 1.25 MG (50000 UNIT) PO CAPS: 50000.0000 [IU] | ORAL_CAPSULE | ORAL | 0 refills | Status: DC

## 2020-08-02 DIAGNOSIS — R748 Abnormal levels of other serum enzymes: Secondary | ICD-10-CM | POA: Insufficient documentation

## 2020-08-02 NOTE — Progress Notes (Signed)
Chief Complaint:   OBESITY Rhonda [name] is here to discuss her progress with her obesity treatment plan along with follow-up of her obesity related diagnoses. Rhonda [name] is on the Category 2 Plan and states she is following her eating plan approximately 80% of the time. Rhonda [name] states she is exercising 0 minutes 0 times per week.  Today's visit was #: 64 Starting weight: 273 lbs Starting date: 01/10/2020 Today's weight: 247 lbs Today's date: 08/01/2020 Total lbs lost to date: 26 lbs Total lbs lost since last in-office visit: 0 lbs  Interim History: Rhonda [name] notes that she moved recently and has eaten out for dinner quite a bit. She feels she will be able to meal plan better now that she is unpacked. Her appetite is well controlled on Saxenda.  Subjective:   1. Vitamin D deficiency Rhonda [name]'s Vit D has decreased to 44.3 from 46.7 since changing dose to weekly from twice a week.  2. Elevated liver enzymes Rhonda [name]'s ALT is slightly elevated at 52 and has been elevated since May 2019. This is likely due to NAFLD. She reports she has not had an abdominal ultrasound to confirm NAFLD.  Lab Results  Component Value Date   ALT 52 (H) 07/19/2020   AST 37 07/19/2020   ALKPHOS 68 07/19/2020   BILITOT 0.4 07/19/2020    Assessment/Plan:   1. Vitamin D deficiency Rhonda [name] will increase dose of Vit D 50,000 IU to every 3 days. We will send a new prescription to pharmacy.   - Vitamin D, Ergocalciferol, (DRISDOL) 1.25 MG (50000 UNIT) CAPS capsule; Take 1 capsule (50,000 Units total) by mouth every 3 (three) days.  Dispense: 10 capsule; Refill: 0  2. Elevated liver enzymes Rhonda [name] will continue with weight loss efforts. Recheck in 3 months.  3. Class 2 severe obesity with serious comorbidity and body mass index (BMI) of 35.0 to 35.9 in adult, unspecified obesity type [facility]) Rhonda [name] is currently in the action stage of change. As such, her goal is to continue with weight loss efforts. She has agreed  to the Category 2 Plan.   We both agree to continue Saxenda 2.4 mg daily.  Exercise goals: No exercise has been prescribed at this time.  Behavioral modification strategies: meal planning and cooking strategies.  Rhonda [name] has agreed to follow-up with our clinic in 3 weeks.   Objective:   Blood pressure 121/76, pulse 77, temperature 97.8 F (36.6 C), height 5\' 10"  (1.778 m), weight 247 lb (112 kg), SpO2 98 %. Body mass index is 35.44 kg/m.  General: Cooperative, alert, well developed, in no acute distress. HEENT: Conjunctivae and lids unremarkable. Cardiovascular: Regular rhythm.  Lungs: Normal work of breathing. Neurologic: No focal deficits.   Lab Results  Component Value Date   CREATININE 1.09 (H) 07/19/2020   BUN 12 07/19/2020   NA 137 07/19/2020   K 4.6 07/19/2020   CL 101 07/19/2020   CO2 22 07/19/2020   Lab Results  Component Value Date   ALT 52 (H) 07/19/2020   AST 37 07/19/2020   ALKPHOS 68 07/19/2020   BILITOT 0.4 07/19/2020   Lab Results  Component Value Date   HGBA1C 5.0 07/19/2020   HGBA1C 5.2 01/25/2020   HGBA1C 5.2 09/21/2019   HGBA1C 5.1 04/20/2019   HGBA1C 5.2 10/09/2018   Lab Results  Component Value Date   INSULIN 34.0 (H) 07/19/2020   INSULIN 9.9 01/25/2020   INSULIN 20.3 09/21/2019   INSULIN 13.8 04/20/2019   INSULIN 13.0 10/09/2018   Lab  Results  Component Value Date   TSH 1.470 01/09/2018   Lab Results  Component Value Date   CHOL 153 01/09/2018   HDL 43 01/09/2018   LDLCALC 97 01/09/2018   TRIG 63 01/09/2018   Lab Results  Component Value Date   WBC 4.7 01/09/2018   HGB 14.5 01/09/2018   HCT 42.1 01/09/2018   MCV 89 01/09/2018   PLT 195 09/20/2016   Attestation Statements:   Reviewed by clinician on day of visit: allergies, medications, problem list, medical history, surgical history, family history, social history, and previous encounter notes.  Coral Ceo, am acting as Location manager for [employer],  [place].  I have reviewed the above documentation for accuracy and completeness, and I agree with the above. -  Georgianne Fick, FNP

## 2020-08-09 ENCOUNTER — Telehealth: Payer: No Typology Code available for payment source | Admitting: Psychiatry

## 2020-08-22 ENCOUNTER — Encounter (INDEPENDENT_AMBULATORY_CARE_PROVIDER_SITE_OTHER): Payer: Self-pay | Admitting: Family Medicine

## 2020-08-22 ENCOUNTER — Other Ambulatory Visit (INDEPENDENT_AMBULATORY_CARE_PROVIDER_SITE_OTHER): Payer: Self-pay | Admitting: Family Medicine

## 2020-08-22 ENCOUNTER — Telehealth (INDEPENDENT_AMBULATORY_CARE_PROVIDER_SITE_OTHER): Payer: No Typology Code available for payment source | Admitting: Family Medicine

## 2020-08-22 DIAGNOSIS — Z6835 Body mass index (BMI) 35.0-35.9, adult: Secondary | ICD-10-CM | POA: Diagnosis not present

## 2020-08-22 DIAGNOSIS — E8881 Metabolic syndrome: Secondary | ICD-10-CM

## 2020-08-22 DIAGNOSIS — E559 Vitamin D deficiency, unspecified: Secondary | ICD-10-CM | POA: Diagnosis not present

## 2020-08-22 MED ORDER — VITAMIN D (ERGOCALCIFEROL) 1.25 MG (50000 UNIT) PO CAPS: 50000.0000 [IU] | ORAL_CAPSULE | ORAL | 0 refills | Status: DC

## 2020-08-22 MED ORDER — SAXENDA 18 MG/3ML ~~LOC~~ SOPN: 3.0000 mg | PEN_INJECTOR | Freq: Every day | SUBCUTANEOUS | 0 refills | Status: DC

## 2020-08-24 NOTE — Progress Notes (Addendum)
TeleHealth Visit:  Due to the COVID-19 pandemic, this visit was completed with telemedicine (audio) technology to reduce patient and provider exposure as well as to preserve personal protective equipment.   Rhonda [name] has verbally consented to this TeleHealth visit. The patient is located at home, the provider is located at the [facility] office. The participants in this visit include the listed provider and patient. The visit was conducted today phone (22 minute call).  Chief Complaint: OBESITY Rhonda [name] is here to discuss her progress with her obesity treatment plan along with follow-up of her obesity related diagnoses. Rhonda [name] is on the Category 2 Plan and states she is following her eating plan approximately 50% of the time. Rhonda [name] states she is exercising 0 minutes 0 times per week.  Today's visit was #: 38 Starting weight: 273 lbs Starting date: 01/09/2018  Interim History: Rhonda [name] feels she has maintained her weight. She is on 2.4 mg of Saxenda. She feels she is able to eat more with Saxenda now than she used to . She is cooking more than she had been- about 4 nights a week. Rhonda [name] is eating out for lunch more often . She also skips breakfast.  Subjective:   1. Vitamin D deficiency Rhonda [name]'s Vitamin D level is nearly at goal at 44.3. She is currently taking prescription vitamin D 50,000 IU every 3 days. She denies nausea, vomiting or muscle weakness.  2. Insulin resistance Rhonda [name] has a diagnosis of insulin resistance based on her elevated fasting insulin level >5. She also has PCOS. She is not on Metformin.   Lab Results  Component Value Date   INSULIN 34.0 (H) 07/19/2020   INSULIN 9.9 01/25/2020   INSULIN 20.3 09/21/2019   INSULIN 13.8 04/20/2019   INSULIN 13.0 10/09/2018   Lab Results  Component Value Date   HGBA1C 5.0 07/19/2020    Assessment/Plan:   1. Vitamin D deficiency  She agrees to continue to take prescription Vitamin D @50 ,000 IU every week  and will follow-up for routine testing of Vitamin D, at least 2-3 times per year to avoid over-replacement. Refill Vit D for 1 month, as per below.  - Vitamin D, Ergocalciferol, (DRISDOL) 1.25 MG (50000 UNIT) CAPS capsule; Take 1 capsule (50,000 Units total) by mouth every 3 (three) days.  Dispense: 10 capsule; Refill: 0  2. Insulin resistance Rhonda [name] will continue to work on weight loss, exercise, and decreasing simple carbohydrates to help decrease the risk of diabetes. Rhonda [name] agreed to follow-up with Rhonda [name] to closely monitor her progress. Continue Saxenda at increased dose.  3. Class 2 severe obesity with serious comorbidity and body mass index (BMI) of 35.0 to 35.9 in adult, unspecified obesity type [facility]) Rhonda [name] is currently in the action stage of change. As such, her goal is to continue with weight loss efforts. She has agreed to the Category 2 Plan.   We will increase Saxenda to 3.0 mg daily, from 2.4 mg.  Have protein shake on mornings she skips breakfast.  - Liraglutide -Weight Management (SAXENDA) 18 MG/3ML SOPN; Inject 3 mg into the skin daily.  Dispense: 3 mL; Refill: 0  Exercise goals: All adults should avoid inactivity. Some physical activity is better than none, and adults who participate in any amount of physical activity gain some health benefits.  Behavioral modification strategies: increasing lean protein intake, decreasing eating out, no skipping meals and meal planning and cooking strategies.  Sreshta has agreed to follow-up with our clinic in 3 weeks.  Objective:   VITALS: Per patient if applicable, see vitals. GENERAL: Alert and in no acute distress. CARDIOPULMONARY: No increased WOB. Speaking in clear sentences.  PSYCH: Pleasant and cooperative. Speech normal rate and rhythm. Affect is appropriate. Insight and judgement are appropriate. Attention is focused, linear, and appropriate.  NEURO: Oriented as arrived to appointment on time with no prompting.    Lab Results  Component Value Date   CREATININE 1.09 (H) 07/19/2020   BUN 12 07/19/2020   NA 137 07/19/2020   K 4.6 07/19/2020   CL 101 07/19/2020   CO2 22 07/19/2020   Lab Results  Component Value Date   ALT 52 (H) 07/19/2020   AST 37 07/19/2020   ALKPHOS 68 07/19/2020   BILITOT 0.4 07/19/2020   Lab Results  Component Value Date   HGBA1C 5.0 07/19/2020   HGBA1C 5.2 01/25/2020   HGBA1C 5.2 09/21/2019   HGBA1C 5.1 04/20/2019   HGBA1C 5.2 10/09/2018   Lab Results  Component Value Date   INSULIN 34.0 (H) 07/19/2020   INSULIN 9.9 01/25/2020   INSULIN 20.3 09/21/2019   INSULIN 13.8 04/20/2019   INSULIN 13.0 10/09/2018   Lab Results  Component Value Date   TSH 1.470 01/09/2018   Lab Results  Component Value Date   CHOL 153 01/09/2018   HDL 43 01/09/2018   LDLCALC 97 01/09/2018   TRIG 63 01/09/2018   Lab Results  Component Value Date   WBC 4.7 01/09/2018   HGB 14.5 01/09/2018   HCT 42.1 01/09/2018   MCV 89 01/09/2018   PLT 195 09/20/2016   No results found for: IRON, TIBC, FERRITIN  Attestation Statements:   Reviewed by clinician on day of visit: allergies, medications, problem list, medical history, surgical history, family history, social history, and previous encounter notes.  Coral Ceo, am acting as Location manager for [employer], [place].  I have reviewed the above documentation for accuracy and completeness, and I agree with the above. - Georgianne Fick, FNP

## 2020-08-29 ENCOUNTER — Telehealth (INDEPENDENT_AMBULATORY_CARE_PROVIDER_SITE_OTHER): Payer: No Typology Code available for payment source | Admitting: Psychiatry

## 2020-08-29 ENCOUNTER — Encounter: Payer: Self-pay | Admitting: Psychiatry

## 2020-08-29 ENCOUNTER — Other Ambulatory Visit: Payer: Self-pay

## 2020-08-29 ENCOUNTER — Other Ambulatory Visit: Payer: Self-pay | Admitting: Psychiatry

## 2020-08-29 DIAGNOSIS — F3342 Major depressive disorder, recurrent, in full remission: Secondary | ICD-10-CM

## 2020-08-29 DIAGNOSIS — F411 Generalized anxiety disorder: Secondary | ICD-10-CM | POA: Diagnosis not present

## 2020-08-29 DIAGNOSIS — F5105 Insomnia due to other mental disorder: Secondary | ICD-10-CM

## 2020-08-29 MED ORDER — BUPROPION HCL ER (XL) 300 MG PO TB24: 300.0000 mg | ORAL_TABLET | Freq: Every day | ORAL | 0 refills | Status: DC

## 2020-08-29 MED ORDER — HYDROXYZINE HCL 25 MG PO TABS: 12.5000 mg | ORAL_TABLET | Freq: Two times a day (BID) | ORAL | 1 refills | Status: DC | PRN

## 2020-08-29 MED ORDER — DOXEPIN HCL 10 MG PO CAPS: 10.0000 mg | ORAL_CAPSULE | Freq: Every evening | ORAL | 1 refills | Status: DC | PRN

## 2020-08-29 MED ORDER — BUSPIRONE HCL 10 MG PO TABS: 20.0000 mg | ORAL_TABLET | Freq: Every day | ORAL | 0 refills | Status: DC

## 2020-08-29 NOTE — Progress Notes (Signed)
Virtual Visit via Telephone Note  I connected with Rhonda [name] on 08/29/20 at  4:40 PM EST by telephone and verified that I am speaking with the correct person using two identifiers.  Location Provider Location : [facility] Patient Location : [place]  Participants: Patient , Provider   I discussed the limitations, risks, security and privacy concerns of performing an evaluation and management service by telephone and the availability of in person appointments. I also discussed with the patient that there may be a patient responsible charge related to this service. The patient expressed understanding and agreed to proceed.   I discussed the assessment and treatment plan with the patient. The patient was provided an opportunity to ask questions and all were answered. The patient agreed with the plan and demonstrated an understanding of the instructions.   The patient was advised to call back or seek an in-person evaluation if the symptoms worsen or if the condition fails to improve as anticipated.  [place] MD OP Progress Note  08/29/2020 6:26 PM Rhonda [name]  MRN:  [id number]  Chief Complaint:  Chief Complaint    Follow-up     HPI: Rhonda [name] is a 35 year old Caucasian female, married, employed, has a history of depression, GAD, insomnia was evaluated by telemedicine today.  Patient today reports she is currently struggling with mild anxiety symptoms on and off.  She has a lot going on.  She reports she has started her new semester at school.  She is currently taking 2 classes.  She is also working and work continues to be busy.  Patient reports she continues to be separated from her husband although they live in the same house for now.  For the most part that is working out okay for now.  Patient is currently in psychotherapy sessions with a therapist on a weekly basis.  That does work.  Patient is compliant on medications.  Denies side effects.  Patient denies any  suicidality, homicidality or perceptual disturbances.  Patient denies any other concerns today.  Visit Diagnosis:    ICD-10-CM   1. MDD (major depressive disorder), recurrent, in full remission ([place])  F33.42 busPIRone (BUSPAR) 10 MG tablet  2. GAD (generalized anxiety disorder)  F41.1 hydrOXYzine (ATARAX/VISTARIL) 25 MG tablet  3. Insomnia due to mental condition  F51.05 doxepin (SINEQUAN) 10 MG capsule    buPROPion (WELLBUTRIN XL) 300 MG 24 hr tablet    Past Psychiatric History: I have reviewed past psychiatric history from my progress note on 11/05/2018  Past Medical History:  Past Medical History:  Diagnosis Date  . Depression    treated  . PCOS (polycystic ovarian syndrome) 2008  . Plantar fasciitis     Past Surgical History:  Procedure Laterality Date  . CHOLECYSTECTOMY      Family Psychiatric History: I have reviewed family psychiatric history from my progress note on 11/05/2018  Family History:  Family History  Problem Relation Age of Onset  . High blood pressure Mother   . Depression Father   . Liver disease Father   . Alcoholism Father   . Alcohol abuse Father   . Diabetes Paternal Grandmother   . Bipolar disorder Maternal Aunt     Social History: Reviewed social history from my progress note on 11/05/2018 Social History   Socioeconomic History  . Marital status: Married    Spouse name: [name]  . Number of children: 2  . Years of education: Not on file  . Highest education level: Bachelor's degree (  e.g., BA, AB, BS)  Occupational History  . Occupation: Therapist, sports  Tobacco Use  . Smoking status: Never Smoker  . Smokeless tobacco: Never Used  Vaping Use  . Vaping Use: Never used  Substance and Sexual Activity  . Alcohol use: No  . Drug use: No  . Sexual activity: Yes    Birth control/protection: None  Other Topics Concern  . Not on file  Social History Narrative  . Not on file   Social Determinants of Health   Financial Resource Strain: Not on file   Food Insecurity: Not on file  Transportation Needs: Not on file  Physical Activity: Not on file  Stress: Not on file  Social Connections: Not on file    Allergies: No Known Allergies  Metabolic Disorder Labs: Lab Results  Component Value Date   HGBA1C 5.0 07/19/2020   No results found for: PROLACTIN Lab Results  Component Value Date   CHOL 153 01/09/2018   TRIG 63 01/09/2018   HDL 43 01/09/2018   LDLCALC 97 01/09/2018   Lab Results  Component Value Date   TSH 1.470 01/09/2018    Therapeutic Level Labs: No results found for: LITHIUM No results found for: VALPROATE No components found for:  CBMZ  Current Medications: Current Outpatient Medications  Medication Sig Dispense Refill  . hydrOXYzine (ATARAX/VISTARIL) 25 MG tablet Take 0.5-1 tablets (12.5-25 mg total) by mouth 2 (two) times daily as needed for anxiety. 60 tablet 1  . buPROPion (WELLBUTRIN XL) 300 MG 24 hr tablet Take 1 tablet (300 mg total) by mouth daily. 90 tablet 0  . busPIRone (BUSPAR) 10 MG tablet Take 2 tablets (20 mg total) by mouth daily with breakfast. 180 tablet 0  . cyclobenzaprine (FLEXERIL) 5 MG tablet Take 5 mg by mouth at bedtime as needed.    . cyclobenzaprine (FLEXERIL) 5 MG tablet Take by mouth.    . doxepin (SINEQUAN) 10 MG capsule Take 1-2 capsules (10-20 mg total) by mouth at bedtime as needed. For sleep 30 capsule 1  . ELDERBERRY PO Take by mouth.    . Insulin Pen Needle 32G X 4 MM MISC Use with saxenda daily 100 each 0  . levocetirizine (XYZAL) 5 MG tablet Take 5 mg by mouth every evening.    Marland Kitchen levonorgestrel (MIRENA) 20 MCG/24HR IUD 1 each by Intrauterine route once.    . Liraglutide -Weight Management (SAXENDA) 18 MG/3ML SOPN Inject 3 mg into the skin daily. 3 mL 0  . Melatonin 5 MG CHEW Chew 5 mg by mouth at bedtime.    . Multiple Vitamin (MULTIVITAMIN) tablet Take 1 tablet by mouth daily.    . Probiotic Product (PROBIOTIC-10 PO) Take by mouth.    . spironolactone (ALDACTONE) 100 MG  tablet Take by mouth.    . triamcinolone cream (KENALOG) 0.1 % Apply 1 application topically 2 (two) times daily as needed. For itching 60 g 0  . Vitamin D, Ergocalciferol, (DRISDOL) 1.25 MG (50000 UNIT) CAPS capsule Take 1 capsule (50,000 Units total) by mouth every 3 (three) days. 10 capsule 0   No current facility-administered medications for this visit.  A   Musculoskeletal: Strength & Muscle Tone: UTA Gait & Station: UTA Patient leans: N/A  Psychiatric Specialty Exam: Review of Systems  Psychiatric/Behavioral: The patient is nervous/anxious.   All other systems reviewed and are negative.   There were no vitals taken for this visit.There is no height or weight on file to calculate BMI.  General Appearance: UTA  Eye Contact:  UTA  Speech:  Clear and Coherent  Volume:  Normal  Mood:  Anxious  Affect:  UTA  Thought Process:  Goal Directed and Descriptions of Associations: Intact  Orientation:  Full (Time, Place, and Person)  Thought Content: Logical   Suicidal Thoughts:  No  Homicidal Thoughts:  No  Memory:  Immediate;   Fair Recent;   Fair Remote;   Fair  Judgement:  Fair  Insight:  Fair  Psychomotor Activity:  UTA  Concentration:  Concentration: Fair and Attention Span: Fair  Recall:  [employer] of Knowledge: Fair  Language: Fair  Akathisia:  No  Handed:  Right  AIMS (if indicated): UTA  Assets:  Communication Skills Desire for [place] Talents/Skills Transportation Vocational/Educational  ADL's:  Intact  Cognition: WNL  Sleep:  Fair   Screenings: PHQ2-9   [facility] Visit from 01/09/2018 in [facility]  PHQ-2 Total Score 3  PHQ-9 Total Score 13       Assessment and Plan: Rhonda [name] is a 35 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  Patient is biologically predisposed given her family history of mental health  problems.  Patient with psychosocial stressors of being in school, job related stressors, relationship struggles, current academic.  Patient currently struggles with anxiety on and off and will benefit from the following plan.  Plan MDD in remission Wellbutrin XL 300 mg p.o. daily BuSpar 10 mg p.o. twice daily L-methylfolate as prescribed.  GAD- some progress BuSpar as prescribed Continue CBT Start hydroxyzine 12.5- 25 mg p.o. twice daily as needed for severe anxiety symptoms  Insomnia-improving Doxepin 10 to 20 mg p.o. nightly as needed Patient to continue sleep hygiene techniques  Continue CBT with Ms. Carie Sun  Review gensight testing results while making medication changes.  Follow-up in clinic in 2 months or sooner if needed.  I have spent atleast 20 minutes non face to face with patient today. More than 50 % of the time was spent for preparing to see the patient ( e.g., review of test, records ), ordering medications and test ,psychoeducation and supportive psychotherapy and care coordination,as well as documenting clinical information in electronic health record. This note was generated in part or whole with voice recognition software. Voice recognition is usually quite accurate but there are transcription errors that can and very often do occur. I apologize for any typographical errors that were not detected and corrected.      Ursula Alert, MD 08/29/2020, 6:26 PM

## 2020-08-29 NOTE — Patient Instructions (Signed)
Hydroxyzine capsules or tablets What is this medicine? HYDROXYZINE (hye [place] i zeen) is an antihistamine. This medicine is used to treat allergy symptoms. It is also used to treat anxiety and tension. This medicine can be used with other medicines to induce sleep before surgery. This medicine may be used for other purposes; ask your health care provider or pharmacist if you have questions. COMMON BRAND NAME(S): ANX, Atarax, Rezine, Vistaril What should I tell my health care provider before I take this medicine? They need to know if you have any of these conditions:  glaucoma  heart disease  history of irregular heartbeat  kidney disease  liver disease  lung or breathing disease, like asthma  stomach or intestine problems  thyroid disease  trouble passing urine  an unusual or allergic reaction to hydroxyzine, cetirizine, other medicines, foods, dyes or preservatives  pregnant or trying to get pregnant  breast-feeding How should I use this medicine? Take this medicine by mouth with a full glass of water. Follow the directions on the prescription label. You may take this medicine with food or on an empty stomach. Take your medicine at regular intervals. Do not take your medicine more often than directed. Talk to your pediatrician regarding the use of this medicine in children. Special care may be needed. While this drug may be prescribed for children as young as 73 years of age for selected conditions, precautions do apply. Patients over 36 years old may have a stronger reaction and need a smaller dose. Overdosage: If you think you have taken too much of this medicine contact a poison control center or emergency room at once. NOTE: This medicine is only for you. Do not share this medicine with others. What if I miss a dose? If you miss a dose, take it as soon as you can. If it is almost time for your next dose, take only that dose. Do not take double or extra doses. What may  interact with this medicine? Do not take this medicine with any of the following medications:  cisapride  dronedarone  pimozide  thioridazine This medicine may also interact with the following medications:  alcohol  antihistamines for allergy, cough, and cold  atropine  barbiturate medicines for sleep or seizures, like phenobarbital  certain antibiotics like erythromycin or clarithromycin  certain medicines for anxiety or sleep  certain medicines for bladder problems like oxybutynin, tolterodine  certain medicines for depression or psychotic disturbances  certain medicines for irregular heart beat  certain medicines for Parkinson's disease like benztropine, trihexyphenidyl  certain medicines for seizures like phenobarbital, primidone  certain medicines for stomach problems like dicyclomine, hyoscyamine  certain medicines for travel sickness like scopolamine  ipratropium  narcotic medicines for pain  other medicines that prolong the QT interval (an abnormal heart rhythm) like dofetilide This list may not describe all possible interactions. Give your health care provider a list of all the medicines, herbs, non-prescription drugs, or dietary supplements you use. Also tell them if you smoke, drink alcohol, or use illegal drugs. Some items may interact with your medicine. What should I watch for while using this medicine? Tell your doctor or health care professional if your symptoms do not improve. You may get drowsy or dizzy. Do not drive, use machinery, or do anything that needs mental alertness until you know how this medicine affects you. Do not stand or sit up quickly, especially if you are an older patient. This reduces the risk of dizzy or fainting spells. Alcohol may  interfere with the effect of this medicine. Avoid alcoholic drinks. Your mouth may get dry. Chewing sugarless gum or sucking hard candy, and drinking plenty of water may help. Contact your doctor if the  problem does not go away or is severe. This medicine may cause dry eyes and blurred vision. If you wear contact lenses you may feel some discomfort. Lubricating drops may help. See your eye doctor if the problem does not go away or is severe. If you are receiving skin tests for allergies, tell your doctor you are using this medicine. What side effects may I notice from receiving this medicine? Side effects that you should report to your doctor or health care professional as soon as possible:  allergic reactions like skin rash, itching or hives, swelling of the face, lips, or tongue  changes in vision  confusion  fast, irregular heartbeat  seizures  tremor  trouble passing urine or change in the amount of urine Side effects that usually do not require medical attention (report to your doctor or health care professional if they continue or are bothersome):  constipation  drowsiness  dry mouth  headache  tiredness This list may not describe all possible side effects. Call your doctor for medical advice about side effects. You may report side effects to FDA at 1-800-FDA-1088. Where should I keep my medicine? Keep out of the reach of children. Store at room temperature between 15 and 30 degrees C (59 and 86 degrees F). Keep container tightly closed. Throw away any unused medicine after the expiration date. NOTE: This sheet is a summary. It may not cover all possible information. If you have questions about this medicine, talk to your doctor, pharmacist, or health care provider.  2021 Elsevier/Gold Standard (2018-07-28 13:19:55)  

## 2020-09-13 ENCOUNTER — Encounter (INDEPENDENT_AMBULATORY_CARE_PROVIDER_SITE_OTHER): Payer: Self-pay | Admitting: Family Medicine

## 2020-09-13 ENCOUNTER — Ambulatory Visit (INDEPENDENT_AMBULATORY_CARE_PROVIDER_SITE_OTHER): Payer: No Typology Code available for payment source | Admitting: Family Medicine

## 2020-09-13 ENCOUNTER — Other Ambulatory Visit: Payer: Self-pay

## 2020-09-13 VITALS — BP 125/80 | HR 77 | Temp 97.5°F | Ht 70.0 in | Wt 252.0 lb

## 2020-09-13 DIAGNOSIS — Z6836 Body mass index (BMI) 36.0-36.9, adult: Secondary | ICD-10-CM | POA: Diagnosis not present

## 2020-09-13 DIAGNOSIS — E8881 Metabolic syndrome: Secondary | ICD-10-CM

## 2020-09-15 NOTE — Progress Notes (Signed)
Chief Complaint:   OBESITY Rhonda [name] is here to discuss her progress with her obesity treatment plan along with follow-up of her obesity related diagnoses. Rhonda [name] is on the Category 2 Plan and states she is following her eating plan approximately 50% of the time. Rhonda [name] states she is doing 0 minutes 0 times per week.  Today's visit was #: 31 Starting weight: 273 lbs Starting date: 01/09/2018 Today's weight: 252 lbs Today's date: 09/13/2020 Total lbs lost to date: 21 Total lbs lost since last in-office visit: (+5)  Interim History: Rhonda [name] is up 5 lbs today. However, overall she has lost 21 lbs. She is on 2.7 mg of Saxenda, and she does not feel it is suppressing her appetite well. She feels satiety is less. She is eating out quite often at supper.  Subjective:   1. Insulin resistance Rhonda [name] is currently on Saxenda 2.7 mg and she feels it does not help much with appetite or satiety recently.  Lab Results  Component Value Date   INSULIN 34.0 (H) 07/19/2020   INSULIN 9.9 01/25/2020   INSULIN 20.3 09/21/2019   INSULIN 13.8 04/20/2019   INSULIN 13.0 10/09/2018   Lab Results  Component Value Date   HGBA1C 5.0 07/19/2020   Assessment/Plan:   1. Insulin resistance  We will possibly switch to Ozempic and then [facility] as dose escalates.  2. Class 2 severe obesity with serious comorbidity and body mass index (BMI) of 36.0 to 36.9 in adult, unspecified obesity type Rhonda [name]) Rhonda [name] is currently in the action stage of change. As such, her goal is to continue with weight loss efforts. She has agreed to the Category 2 Plan.   We discussed various medication options to help Rhonda [name] with her weight loss efforts and we both agreed to increase Saxenda to 3.0 mg, and will plan to switch to Ozempic after she uses up her Saxenda.  Exercise goals: No exercise has been prescribed at this time.  Behavioral modification strategies: decreasing eating out and meal planning and cooking  strategies.  Rhonda [name] has agreed to follow-up with our clinic in 3 weeks.   Objective:   Blood pressure 125/80, pulse 77, temperature (!) 97.5 F (36.4 C), height 5\' 10"  (1.778 m), weight 252 lb (114.3 kg), SpO2 96 %. Body mass index is 36.16 kg/m.  General: Cooperative, alert, well developed, in no acute distress. HEENT: Conjunctivae and lids unremarkable. Cardiovascular: Regular rhythm.  Lungs: Normal work of breathing. Neurologic: No focal deficits.   Lab Results  Component Value Date   CREATININE 1.09 (H) 07/19/2020   BUN 12 07/19/2020   NA 137 07/19/2020   K 4.6 07/19/2020   CL 101 07/19/2020   CO2 22 07/19/2020   Lab Results  Component Value Date   ALT 52 (H) 07/19/2020   AST 37 07/19/2020   ALKPHOS 68 07/19/2020   BILITOT 0.4 07/19/2020   Lab Results  Component Value Date   HGBA1C 5.0 07/19/2020   HGBA1C 5.2 01/25/2020   HGBA1C 5.2 09/21/2019   HGBA1C 5.1 04/20/2019   HGBA1C 5.2 10/09/2018   Lab Results  Component Value Date   INSULIN 34.0 (H) 07/19/2020   INSULIN 9.9 01/25/2020   INSULIN 20.3 09/21/2019   INSULIN 13.8 04/20/2019   INSULIN 13.0 10/09/2018   Lab Results  Component Value Date   TSH 1.470 01/09/2018   Lab Results  Component Value Date   CHOL 153 01/09/2018   HDL 43 01/09/2018   LDLCALC 97 01/09/2018   TRIG 63 01/09/2018  Lab Results  Component Value Date   WBC 4.7 01/09/2018   HGB 14.5 01/09/2018   HCT 42.1 01/09/2018   MCV 89 01/09/2018   PLT 195 09/20/2016   No results found for: IRON, TIBC, FERRITIN  Attestation Statements:   Reviewed by clinician on day of visit: allergies, medications, problem list, medical history, surgical history, family history, social history, and previous encounter notes.   Wilhemena Durie, am acting as Location manager for [employer], FNP-C.  I have reviewed the above documentation for accuracy and completeness, and I agree with the above. -  Georgianne Fick, FNP

## 2020-09-19 ENCOUNTER — Encounter (INDEPENDENT_AMBULATORY_CARE_PROVIDER_SITE_OTHER): Payer: Self-pay | Admitting: Family Medicine

## 2020-10-04 ENCOUNTER — Ambulatory Visit (INDEPENDENT_AMBULATORY_CARE_PROVIDER_SITE_OTHER): Payer: No Typology Code available for payment source | Admitting: Family Medicine

## 2020-10-07 ENCOUNTER — Telehealth: Payer: No Typology Code available for payment source | Admitting: Emergency Medicine

## 2020-10-07 ENCOUNTER — Other Ambulatory Visit: Payer: Self-pay | Admitting: Emergency Medicine

## 2020-10-07 DIAGNOSIS — N898 Other specified noninflammatory disorders of vagina: Secondary | ICD-10-CM | POA: Diagnosis not present

## 2020-10-07 MED ORDER — FLUCONAZOLE 150 MG PO TABS: 150.0000 mg | ORAL_TABLET | Freq: Once | ORAL | 0 refills | Status: DC

## 2020-10-07 NOTE — Addendum Note (Signed)
Addended by: Montine Circle B on: 10/07/2020 12:00 PM   Modules accepted: Orders

## 2020-10-07 NOTE — Progress Notes (Signed)
We are sorry that you are not feeling well. Here is how we plan to help! Based on what you shared with me it looks like you: May have a yeast vaginosis  Vaginosis is an inflammation of the vagina that can result in discharge, itching and pain. The cause is usually a change in the normal balance of vaginal bacteria or an infection. Vaginosis can also result from reduced estrogen levels after menopause.  The most common causes of vaginosis are:   Bacterial vaginosis which results from an overgrowth of one on several organisms that are normally present in your vagina.   Yeast infections which are caused by a naturally occurring fungus called candida.   Vaginal atrophy (atrophic vaginosis) which results from the thinning of the vagina from reduced estrogen levels after menopause.   Trichomoniasis which is caused by a parasite and is commonly transmitted by sexual intercourse.  Factors that increase your risk of developing vaginosis include: Marland Kitchen Medications, such as antibiotics and steroids . Uncontrolled diabetes . Use of hygiene products such as bubble bath, vaginal spray or vaginal deodorant . Douching . Wearing damp or tight-fitting clothing . Using an intrauterine device (IUD) for birth control . Hormonal changes, such as those associated with pregnancy, birth control pills or menopause . Sexual activity . Having a sexually transmitted infection  Your treatment plan is Monistat (miconazole) or Gyne-Lotrimin (clotrimazole) over the counter at most pharmacies.  Be sure to take all of the medication as directed. Stop taking any medication if you develop a rash, tongue swelling or shortness of breath. Mothers who are breast feeding should consider pumping and discarding their breast milk while on these antibiotics. However, there is no consensus that infant exposure at these doses would be harmful.  Remember that medication creams can weaken latex condoms. Marland Kitchen   HOME CARE:  Good hygiene  may prevent some types of vaginosis from recurring and may relieve some symptoms:  . Avoid baths, hot tubs and whirlpool spas. Rinse soap from your outer genital area after a shower, and dry the area well to prevent irritation. Don't use scented or harsh soaps, such as those with deodorant or antibacterial action. Marland Kitchen Avoid irritants. These include scented tampons and pads. . Wipe from front to back after using the toilet. Doing so avoids spreading fecal bacteria to your vagina.  Other things that may help prevent vaginosis include:  Marland Kitchen Don't douche. Your vagina doesn't require cleansing other than normal bathing. Repetitive douching disrupts the normal organisms that reside in the vagina and can actually increase your risk of vaginal infection. Douching won't clear up a vaginal infection. . Use a latex condom. Both female and female latex condoms may help you avoid infections spread by sexual contact. . Wear cotton underwear. Also wear pantyhose with a cotton crotch. If you feel comfortable without it, skip wearing underwear to bed. Yeast thrives in [employer] Your symptoms should improve in the next day or two.  GET HELP RIGHT AWAY IF:  . You have pain in your lower abdomen ( pelvic area or over your ovaries) . You develop nausea or vomiting . You develop a fever . Your discharge changes or worsens . You have persistent pain with intercourse . You develop shortness of breath, a rapid pulse, or you faint.  These symptoms could be signs of problems or infections that need to be evaluated by a medical provider now.  MAKE SURE YOU    Understand these instructions.  Will watch your condition.  Will get help right away if you are not doing well or get worse.  Your e-visit answers were reviewed by a board certified advanced clinical practitioner to complete your personal care plan. Depending upon the condition, your plan could have included both over the counter or prescription  medications. Please review your pharmacy choice to make sure that you have choses a pharmacy that is open for you to pick up any needed prescription, Your safety is important to [name]. If you have drug allergies check your prescription carefully.   You can use MyChart to ask questions about today's visit, request a non-urgent call back, or ask for a work or school excuse for 24 hours related to this e-Visit. If it has been greater than 24 hours you will need to follow up with your provider, or enter a new e-Visit to address those concerns. You will get a MyChart message within the next two days asking about your experience. I hope that your e-visit has been valuable and will speed your recovery.  Approximately 5 minutes was used in reviewing the patient's chart, questionnaire, prescribing medications, and documentation.

## 2020-10-17 ENCOUNTER — Ambulatory Visit (INDEPENDENT_AMBULATORY_CARE_PROVIDER_SITE_OTHER): Payer: No Typology Code available for payment source | Admitting: Family Medicine

## 2020-10-17 ENCOUNTER — Encounter (INDEPENDENT_AMBULATORY_CARE_PROVIDER_SITE_OTHER): Payer: Self-pay | Admitting: Family Medicine

## 2020-10-17 ENCOUNTER — Other Ambulatory Visit (INDEPENDENT_AMBULATORY_CARE_PROVIDER_SITE_OTHER): Payer: Self-pay | Admitting: Family Medicine

## 2020-10-17 ENCOUNTER — Other Ambulatory Visit: Payer: Self-pay

## 2020-10-17 VITALS — BP 126/81 | HR 90 | Temp 98.0°F | Ht 70.0 in | Wt 252.0 lb

## 2020-10-17 DIAGNOSIS — Z6836 Body mass index (BMI) 36.0-36.9, adult: Secondary | ICD-10-CM | POA: Diagnosis not present

## 2020-10-17 DIAGNOSIS — E8881 Metabolic syndrome: Secondary | ICD-10-CM

## 2020-10-17 DIAGNOSIS — E559 Vitamin D deficiency, unspecified: Secondary | ICD-10-CM | POA: Diagnosis not present

## 2020-10-17 DIAGNOSIS — E88819 Insulin resistance, unspecified: Secondary | ICD-10-CM

## 2020-10-17 MED ORDER — WEGOVY 1.7 MG/0.75ML ~~LOC~~ SOAJ: 1.7000 mg | SUBCUTANEOUS | 0 refills | Status: DC

## 2020-10-17 MED ORDER — VITAMIN D (ERGOCALCIFEROL) 1.25 MG (50000 UNIT) PO CAPS: 50000.0000 [IU] | ORAL_CAPSULE | ORAL | 0 refills | Status: DC

## 2020-10-18 ENCOUNTER — Encounter (INDEPENDENT_AMBULATORY_CARE_PROVIDER_SITE_OTHER): Payer: Self-pay | Admitting: Family Medicine

## 2020-10-18 NOTE — Progress Notes (Signed)
Chief Complaint:   OBESITY Rhonda [name] is here to discuss her progress with her obesity treatment plan along with follow-up of her obesity related diagnoses. Rhonda [name] is on the Category 2 Plan and states she is following her eating plan approximately 50% of the time. Rhonda [name] states she is doing 0 minutes 0 times per week.  Today's visit was #: 68 Starting weight: 273 lbs Starting date: 01/09/2018 Today's weight: 252 lbs Today's date: 10/17/2020 Total lbs lost to date: 21 lbs Total lbs lost since last in-office visit: 0  Interim History: We increased Saxenda dose to 3.0 mg, and Rhonda [name] notes a reduction  in appetite. She has had a very stressful few weeks due to school work and personal issues. She notes she has been eating out more. She has been eating [employer] prepared meals when she does not have her kids and this has worked well for her.  Subjective:   1. Vitamin D deficiency Rhonda [name]'s Vitamin D level was low at 44.3 on 07/19/2020. She is currently taking prescription vitamin D 50,000 IU each week.    Ref. Range 07/19/2020 08:30  Vitamin D, 25-Hydroxy Latest Ref Range: 30.0 - 100.0 ng/mL 44.3   2. Insulin resistance Rhonda [name] notes some polyphagia. Her last fasting insulin was elevated at 34. Discussed switching to Rhonda [name] to see if it provided better appetite suppression and for ease of weekly injections vs.daily.   Lab Results  Component Value Date   INSULIN 34.0 (H) 07/19/2020   INSULIN 9.9 01/25/2020   INSULIN 20.3 09/21/2019   INSULIN 13.8 04/20/2019   INSULIN 13.0 10/09/2018   Lab Results  Component Value Date   HGBA1C 5.0 07/19/2020     Assessment/Plan:   1. Vitamin D deficiency . She agrees to continue to take prescription Vitamin D @50 ,000 IU every week and will follow-up for routine testing of Vitamin D, at least 2-3 times per year to avoid over-replacement.  - Vitamin D, Ergocalciferol, (DRISDOL) 1.25 MG (50000 UNIT) CAPS capsule; Take 1 capsule (50,000  Units total) by mouth every 3 (three) days.  Dispense: 10 capsule; Refill: 0  2. Insulin resistance  Continue Saxenda and then switch to Rhonda [name] once prior authorization is obtained.   3. Class 2 severe obesity with serious comorbidity and body mass index (BMI) of 36.0 to 36.9 in adult, unspecified obesity type Rhonda [name]) Rhonda [name] is currently in the action stage of change. As such, her goal is to continue with weight loss efforts. She has agreed to the Category 2 Plan.   We discussed various medication options to help Rhonda [name] with her weight loss efforts and we both agreed to start [name] 1.7 mg, as per below. - Semaglutide-Weight Management (WEGOVY) 1.7 MG/0.75ML SOAJ; Inject 1.7 mg into the skin once a week.  Dispense: 3 mL; Refill: 0  Exercise goals: No exercise has been prescribed at this time.  Behavioral modification strategies: decreasing eating out, no skipping meals and meal planning and cooking strategies.  Rhonda [name] has agreed to follow-up with our clinic in 3 weeks.   Objective:   Blood pressure 126/81, pulse 90, temperature 98 F (36.7 C), height 5\' 10"  (1.778 m), weight 252 lb (114.3 kg), SpO2 98 %. Body mass index is 36.16 kg/m.  General: Cooperative, alert, well developed, in no acute distress. HEENT: Conjunctivae and lids unremarkable. Cardiovascular: Regular rhythm.  Lungs: Normal work of breathing. Neurologic: No focal deficits.   Lab Results  Component Value Date   CREATININE 1.09 (H) 07/19/2020   BUN 12  07/19/2020   NA 137 07/19/2020   K 4.6 07/19/2020   CL 101 07/19/2020   CO2 22 07/19/2020   Lab Results  Component Value Date   ALT 52 (H) 07/19/2020   AST 37 07/19/2020   ALKPHOS 68 07/19/2020   BILITOT 0.4 07/19/2020   Lab Results  Component Value Date   HGBA1C 5.0 07/19/2020   HGBA1C 5.2 01/25/2020   HGBA1C 5.2 09/21/2019   HGBA1C 5.1 04/20/2019   HGBA1C 5.2 10/09/2018   Lab Results  Component Value Date   INSULIN 34.0 (H) 07/19/2020   INSULIN  9.9 01/25/2020   INSULIN 20.3 09/21/2019   INSULIN 13.8 04/20/2019   INSULIN 13.0 10/09/2018   Lab Results  Component Value Date   TSH 1.470 01/09/2018   Lab Results  Component Value Date   CHOL 153 01/09/2018   HDL 43 01/09/2018   LDLCALC 97 01/09/2018   TRIG 63 01/09/2018   Lab Results  Component Value Date   WBC 4.7 01/09/2018   HGB 14.5 01/09/2018   HCT 42.1 01/09/2018   MCV 89 01/09/2018   PLT 195 09/20/2016     Attestation Statements:   Reviewed by clinician on day of visit: allergies, medications, problem list, medical history, surgical history, family history, social history, and previous encounter notes.  Coral Ceo, am acting as Location manager for [employer], [place].  I have reviewed the above documentation for accuracy and completeness, and I agree with the above. -  Georgianne Fick, FNP

## 2020-10-19 ENCOUNTER — Telehealth (INDEPENDENT_AMBULATORY_CARE_PROVIDER_SITE_OTHER): Payer: No Typology Code available for payment source | Admitting: Psychiatry

## 2020-10-19 ENCOUNTER — Encounter: Payer: Self-pay | Admitting: Psychiatry

## 2020-10-19 ENCOUNTER — Other Ambulatory Visit: Payer: Self-pay

## 2020-10-19 DIAGNOSIS — F411 Generalized anxiety disorder: Secondary | ICD-10-CM

## 2020-10-19 DIAGNOSIS — F5105 Insomnia due to other mental disorder: Secondary | ICD-10-CM | POA: Diagnosis not present

## 2020-10-19 DIAGNOSIS — F3342 Major depressive disorder, recurrent, in full remission: Secondary | ICD-10-CM

## 2020-10-19 NOTE — Progress Notes (Signed)
Virtual Visit via Video Note  I connected with Rhonda [name] on 10/19/20 at  4:00 PM EST by a video enabled telemedicine application and verified that I am speaking with the correct person using two identifiers.  Location Provider Location : [facility] Patient Location : Home  Participants: Patient , Provider    I discussed the limitations of evaluation and management by telemedicine and the availability of in person appointments. The patient expressed understanding and agreed to proceed.   I discussed the assessment and treatment plan with the patient. The patient was provided an opportunity to ask questions and all were answered. The patient agreed with the plan and demonstrated an understanding of the instructions.   The patient was advised to call back or seek an in-person evaluation if the symptoms worsen or if the condition fails to improve as anticipated.   [place] MD OP Progress Note  10/19/2020 4:20 PM Rhonda [name]  MRN:  [id number]  Chief Complaint:  Chief Complaint    Follow-up     HPI: Rhonda [name] is a 35 year old Caucasian female, married, employed, has a history of depression, GAD, insomnia was evaluated by telemedicine today.  Patient today reports she is currently making progress.  She reports she and her husband separated and he recently moved out and bought a house.  She reports that around that time when he moved out she was very worried about the change and how she would handle sharing custody of her children and so on.  She however reports as she is currently getting used to that it is going well.  Her husband continues to be very involved in taking care of the children.  Patient reports sleep is okay.  She reports appetite is fair.  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  She reports she was recently started on Wegovy.  Patient is compliant on medications as prescribed.  Denies side effects.  Patient denies any other concerns  today.  Visit Diagnosis:    ICD-10-CM   1. MDD (major depressive disorder), recurrent, in full remission ([place])  F33.42   2. GAD (generalized anxiety disorder)  F41.1   3. Insomnia due to mental condition  F51.05     Past Psychiatric History: I have reviewed past psychiatric history from my progress note on 11/05/2018  Past Medical History:  Past Medical History:  Diagnosis Date  . Depression    treated  . PCOS (polycystic ovarian syndrome) 2008  . Plantar fasciitis     Past Surgical History:  Procedure Laterality Date  . CHOLECYSTECTOMY      Family Psychiatric History: I have reviewed family psychiatric history from my progress note on 11/05/2018  Family History:  Family History  Problem Relation Age of Onset  . High blood pressure Mother   . Depression Father   . Liver disease Father   . Alcoholism Father   . Alcohol abuse Father   . Diabetes Paternal Grandmother   . Bipolar disorder Maternal Aunt     Social History: I have reviewed social history from my progress note on 11/05/2018 Social History   Socioeconomic History  . Marital status: Legally Separated    Spouse name: [name]  . Number of children: 2  . Years of education: Not on file  . Highest education level: Bachelor's degree (e.g., BA, AB, BS)  Occupational History  . Occupation: Therapist, sports  Tobacco Use  . Smoking status: Never Smoker  . Smokeless tobacco: Never Used  Vaping Use  .  Vaping Use: Never used  Substance and Sexual Activity  . Alcohol use: No  . Drug use: No  . Sexual activity: Yes    Birth control/protection: None  Other Topics Concern  . Not on file  Social History Narrative  . Not on file   Social Determinants of Health   Financial Resource Strain: Not on file  Food Insecurity: Not on file  Transportation Needs: Not on file  Physical Activity: Not on file  Stress: Not on file  Social Connections: Not on file    Allergies: No Known Allergies  Metabolic Disorder Labs: Lab  Results  Component Value Date   HGBA1C 5.0 07/19/2020   No results found for: PROLACTIN Lab Results  Component Value Date   CHOL 153 01/09/2018   TRIG 63 01/09/2018   HDL 43 01/09/2018   LDLCALC 97 01/09/2018   Lab Results  Component Value Date   TSH 1.470 01/09/2018    Therapeutic Level Labs: No results found for: LITHIUM No results found for: VALPROATE No components found for:  CBMZ  Current Medications: Current Outpatient Medications  Medication Sig Dispense Refill  . buPROPion (WELLBUTRIN XL) 300 MG 24 hr tablet Take 1 tablet (300 mg total) by mouth daily. 90 tablet 0  . busPIRone (BUSPAR) 10 MG tablet Take 2 tablets (20 mg total) by mouth daily with breakfast. 180 tablet 0  . cyclobenzaprine (FLEXERIL) 5 MG tablet Take 5 mg by mouth at bedtime as needed.    . cyclobenzaprine (FLEXERIL) 5 MG tablet Take by mouth.    . doxepin (SINEQUAN) 10 MG capsule Take 1-2 capsules (10-20 mg total) by mouth at bedtime as needed. For sleep 30 capsule 1  . ELDERBERRY PO Take by mouth.    . fluconazole (DIFLUCAN) 150 MG tablet Take 150 mg by mouth once.    . hydrOXYzine (ATARAX/VISTARIL) 25 MG tablet Take 0.5-1 tablets (12.5-25 mg total) by mouth 2 (two) times daily as needed for anxiety. 60 tablet 1  . levocetirizine (XYZAL) 5 MG tablet Take 5 mg by mouth every evening.    Marland Kitchen levonorgestrel (MIRENA) 20 MCG/24HR IUD 1 each by Intrauterine route once.    . Melatonin 5 MG CHEW Chew 5 mg by mouth at bedtime.    . Multiple Vitamin (MULTIVITAMIN) tablet Take 1 tablet by mouth daily.    . Probiotic Product (PROBIOTIC-10 PO) Take by mouth.    . Semaglutide-Weight Management (WEGOVY) 1.7 MG/0.75ML SOAJ Inject 1.7 mg into the skin once a week. 3 mL 0  . spironolactone (ALDACTONE) 100 MG tablet Take by mouth.    . triamcinolone cream (KENALOG) 0.1 % Apply 1 application topically 2 (two) times daily as needed. For itching 60 g 0  . Vitamin D, Ergocalciferol, (DRISDOL) 1.25 MG (50000 UNIT) CAPS  capsule Take 1 capsule (50,000 Units total) by mouth every 3 (three) days. 10 capsule 0   No current facility-administered medications for this visit.     Musculoskeletal: Strength & Muscle Tone: UTA Gait & Station: UTA Patient leans: N/A  Psychiatric Specialty Exam: Review of Systems  Psychiatric/Behavioral: The patient is nervous/anxious.   All other systems reviewed and are negative.   There were no vitals taken for this visit.There is no height or weight on file to calculate BMI.  General Appearance: Casual  Eye Contact:  Fair  Speech:  Clear and Coherent  Volume:  Normal  Mood:  Anxious  Affect:  Congruent  Thought Process:  Goal Directed and Descriptions of Associations: Intact  Orientation:  Full (Time, Place, and Person)  Thought Content: Logical   Suicidal Thoughts:  No  Homicidal Thoughts:  No  Memory:  Immediate;   Fair Recent;   Fair Remote;   Fair  Judgement:  Fair  Insight:  Fair  Psychomotor Activity:  Normal  Concentration:  Concentration: Fair and Attention Span: Fair  Recall:  [employer] of Knowledge: Fair  Language: Fair  Akathisia:  No  Handed:  Right  AIMS (if indicated): UTA  Assets:  Communication Skills Desire for Improvement Housing Social Support Talents/Skills Transportation Vocational/Educational  ADL's:  Intact  Cognition: WNL  Sleep:  Fair   Screenings: [place] Video Visit from 10/19/2020 in [facility] Office Visit from 01/09/2018 in [facility]  PHQ-2 Total Score 1 3  PHQ-9 Total Score -- 13    Flowsheet Row Video Visit from 10/19/2020 in [facility] CATEGORY Low Risk       Assessment and Plan: Rhonda [name] is a 35 year old Caucasian female, married, employed, has a history of MDD, GAD was evaluated by telemedicine today.  Patient is biologically predisposed given her family history of mental health problems.  Patient  with psychosocial stressors of being in school, job related stressors, separation from husband.  Patient however is currently making progress.  Plan as noted below.  Plan MDD in remission Wellbutrin XL 300 mg p.o. daily BuSpar 10 mg p.o. twice daily L-methylfolate as prescribed  GAD-improving BuSpar 10 mg p.o. twice daily Continue CBT Hydroxyzine 12.5-25 mg p.o. twice daily as needed for severe anxiety symptoms.  Insomnia-improving Doxepin 10 to 20 mg p.o. nightly as needed Continue sleep hygiene techniques  Continue CBT with Ms. Doy Mince  [facility] testing results reviewed while making medication changes  Follow-up in clinic in 6 weeks or sooner if needed.  I have spent atleast 22 minutes with patient today. More than 50 % of the time was spent for preparing to see the patient ( e.g., review of test, records ), ordering medications and test ,psychoeducation and supportive psychotherapy and care coordination,as well as documenting clinical information in electronic health record. This note was generated in part or whole with voice recognition software. Voice recognition is usually quite accurate but there are transcription errors that can and very often do occur. I apologize for any typographical errors that were not detected and corrected.        Ursula Alert, MD 10/20/2020, 7:53 PM

## 2020-10-26 ENCOUNTER — Encounter (INDEPENDENT_AMBULATORY_CARE_PROVIDER_SITE_OTHER): Payer: Self-pay

## 2020-10-30 ENCOUNTER — Encounter (INDEPENDENT_AMBULATORY_CARE_PROVIDER_SITE_OTHER): Payer: Self-pay | Admitting: Family Medicine

## 2020-11-01 ENCOUNTER — Ambulatory Visit (INDEPENDENT_AMBULATORY_CARE_PROVIDER_SITE_OTHER): Payer: No Typology Code available for payment source | Admitting: Family Medicine

## 2020-11-15 ENCOUNTER — Ambulatory Visit (INDEPENDENT_AMBULATORY_CARE_PROVIDER_SITE_OTHER): Payer: No Typology Code available for payment source | Admitting: Family Medicine

## 2020-11-15 ENCOUNTER — Encounter (INDEPENDENT_AMBULATORY_CARE_PROVIDER_SITE_OTHER): Payer: Self-pay | Admitting: Family Medicine

## 2020-11-15 ENCOUNTER — Other Ambulatory Visit: Payer: Self-pay

## 2020-11-15 ENCOUNTER — Other Ambulatory Visit (INDEPENDENT_AMBULATORY_CARE_PROVIDER_SITE_OTHER): Payer: Self-pay | Admitting: Family Medicine

## 2020-11-15 VITALS — BP 123/82 | HR 74 | Temp 97.7°F | Ht 70.0 in | Wt 248.0 lb

## 2020-11-15 DIAGNOSIS — Z6839 Body mass index (BMI) 39.0-39.9, adult: Secondary | ICD-10-CM

## 2020-11-15 DIAGNOSIS — E559 Vitamin D deficiency, unspecified: Secondary | ICD-10-CM | POA: Diagnosis not present

## 2020-11-15 DIAGNOSIS — E8881 Metabolic syndrome: Secondary | ICD-10-CM

## 2020-11-15 DIAGNOSIS — E88819 Insulin resistance, unspecified: Secondary | ICD-10-CM

## 2020-11-15 MED ORDER — WEGOVY 1.7 MG/0.75ML ~~LOC~~ SOAJ: 1.7000 mg | SUBCUTANEOUS | 0 refills | Status: DC

## 2020-11-16 ENCOUNTER — Encounter (INDEPENDENT_AMBULATORY_CARE_PROVIDER_SITE_OTHER): Payer: Self-pay | Admitting: Family Medicine

## 2020-11-16 NOTE — Progress Notes (Signed)
Chief Complaint:   OBESITY Rhonda [name] is here to discuss her progress with her obesity treatment plan along with follow-up of her obesity related diagnoses. Rhonda [name] is on the Category 2 Plan and states she is following her eating plan approximately 75% of the time. Rhonda [name] states she is doing 0 minutes 0 times per week.  Today's visit was #: 65 Starting weight: 273 lbs Starting date: 01/09/2018 Today's weight: 248 lbs Today's date: 11/15/2020 Total lbs lost to date: 25 Total lbs lost since last in-office visit: 4  Interim History: Rhonda [name] is on Wegovy 1.7 mg weekly. She denies nausea, but reports loose stools with fatty foods. She does not always get in her snack calories. She is quite busy due to full time school and work. She also has 2 young children. She is using [employer] meals some. She is cooking more and eating out less.  Subjective:   1. Vitamin D deficiency Rhonda [name]'s Vitamin D level was 44.3 on 07/19/2020. She is currently taking prescription vitamin D 50,000 IU every 3 days.    Ref. Range 07/19/2020 08:30  Vitamin D, 25-Hydroxy Latest Ref Range: 30.0 - 100.0 ng/mL 44.3   2. Insulin resistance Rhonda [name] is on Wegovy. Her last A1c was 5.0 and insulin level elevated at 34.  Lab Results  Component Value Date   INSULIN 34.0 (H) 07/19/2020   INSULIN 9.9 01/25/2020   INSULIN 20.3 09/21/2019   INSULIN 13.8 04/20/2019   INSULIN 13.0 10/09/2018   Lab Results  Component Value Date   HGBA1C 5.0 07/19/2020    Assessment/Plan:   1. Vitamin D deficiency  She agrees to continue to take prescription Vitamin D @50 ,000 IU every 3 days and will follow-up for routine testing of Vitamin D, at least 2-3 times per year to avoid over-replacement.  2. Insulin resistance  Continue Wegovy at 1.7 mg weekly.  3. Obesity: BMI 35 Rhonda [name] is currently in the action stage of change. As such, her goal is to continue with weight loss efforts. She has agreed to the Category 2 Plan.   We  discussed various medication options to help Rhonda [name] with her weight loss efforts and we both agreed to continue [place], as per below.. - Semaglutide-Weight Management (WEGOVY) 1.7 MG/0.75ML SOAJ; Inject 1.7 mg into the skin once a week.  Dispense: 3 mL; Refill: 0  Exercise goals: No exercise has been prescribed at this time.  Behavioral modification strategies: meal planning and cooking strategies.  Rhonda [name] has agreed to follow-up with our clinic in 3 weeks.  Objective:   Blood pressure 123/82, pulse 74, temperature 97.7 F (36.5 C), height 5\' 10"  (1.778 m), weight 248 lb (112.5 kg), SpO2 98 %. Body mass index is 35.58 kg/m.  General: Cooperative, alert, well developed, in no acute distress. HEENT: Conjunctivae and lids unremarkable. Cardiovascular: Regular rhythm.  Lungs: Normal work of breathing. Neurologic: No focal deficits.   Lab Results  Component Value Date   CREATININE 1.09 (H) 07/19/2020   BUN 12 07/19/2020   NA 137 07/19/2020   K 4.6 07/19/2020   CL 101 07/19/2020   CO2 22 07/19/2020   Lab Results  Component Value Date   ALT 52 (H) 07/19/2020   AST 37 07/19/2020   ALKPHOS 68 07/19/2020   BILITOT 0.4 07/19/2020   Lab Results  Component Value Date   HGBA1C 5.0 07/19/2020   HGBA1C 5.2 01/25/2020   HGBA1C 5.2 09/21/2019   HGBA1C 5.1 04/20/2019   HGBA1C 5.2 10/09/2018   Lab Results  Component Value Date   INSULIN 34.0 (H) 07/19/2020   INSULIN 9.9 01/25/2020   INSULIN 20.3 09/21/2019   INSULIN 13.8 04/20/2019   INSULIN 13.0 10/09/2018   Lab Results  Component Value Date   TSH 1.470 01/09/2018   Lab Results  Component Value Date   CHOL 153 01/09/2018   HDL 43 01/09/2018   LDLCALC 97 01/09/2018   TRIG 63 01/09/2018   Lab Results  Component Value Date   WBC 4.7 01/09/2018   HGB 14.5 01/09/2018   HCT 42.1 01/09/2018   MCV 89 01/09/2018   PLT 195 09/20/2016   No results found for: IRON, TIBC, FERRITIN  Attestation Statements:   Reviewed by  clinician on day of visit: allergies, medications, problem list, medical history, surgical history, family history, social history, and previous encounter notes.  Coral Ceo, am acting as Location manager for [employer], [place].  I have reviewed the above documentation for accuracy and completeness, and I agree with the above. -  Georgianne Fick, FNP

## 2020-12-06 ENCOUNTER — Other Ambulatory Visit: Payer: Self-pay

## 2020-12-06 ENCOUNTER — Ambulatory Visit (INDEPENDENT_AMBULATORY_CARE_PROVIDER_SITE_OTHER): Payer: No Typology Code available for payment source | Admitting: Family Medicine

## 2020-12-06 ENCOUNTER — Encounter (INDEPENDENT_AMBULATORY_CARE_PROVIDER_SITE_OTHER): Payer: Self-pay | Admitting: Family Medicine

## 2020-12-06 VITALS — BP 127/85 | HR 67 | Temp 98.2°F | Ht 70.0 in | Wt 247.0 lb

## 2020-12-06 DIAGNOSIS — Z6839 Body mass index (BMI) 39.0-39.9, adult: Secondary | ICD-10-CM

## 2020-12-06 DIAGNOSIS — F3341 Major depressive disorder, recurrent, in partial remission: Secondary | ICD-10-CM | POA: Diagnosis not present

## 2020-12-06 DIAGNOSIS — E559 Vitamin D deficiency, unspecified: Secondary | ICD-10-CM

## 2020-12-06 MED ORDER — VITAMIN D (ERGOCALCIFEROL) 1.25 MG (50000 UNIT) PO CAPS
ORAL_CAPSULE | ORAL | 0 refills | Status: DC
  Filled 2020-12-06 – 2021-01-02 (×2): qty 10, 30d supply, fill #0

## 2020-12-07 ENCOUNTER — Other Ambulatory Visit: Payer: Self-pay

## 2020-12-07 ENCOUNTER — Telehealth (INDEPENDENT_AMBULATORY_CARE_PROVIDER_SITE_OTHER): Payer: No Typology Code available for payment source | Admitting: Psychiatry

## 2020-12-07 ENCOUNTER — Encounter (INDEPENDENT_AMBULATORY_CARE_PROVIDER_SITE_OTHER): Payer: Self-pay | Admitting: Family Medicine

## 2020-12-07 ENCOUNTER — Encounter: Payer: Self-pay | Admitting: Psychiatry

## 2020-12-07 DIAGNOSIS — F3342 Major depressive disorder, recurrent, in full remission: Secondary | ICD-10-CM | POA: Diagnosis not present

## 2020-12-07 DIAGNOSIS — F5105 Insomnia due to other mental disorder: Secondary | ICD-10-CM | POA: Diagnosis not present

## 2020-12-07 DIAGNOSIS — F411 Generalized anxiety disorder: Secondary | ICD-10-CM | POA: Diagnosis not present

## 2020-12-07 MED ORDER — BUPROPION HCL ER (XL) 300 MG PO TB24
ORAL_TABLET | Freq: Every day | ORAL | 0 refills | Status: DC
  Filled 2020-12-07 – 2021-01-02 (×2): qty 90, 90d supply, fill #0

## 2020-12-07 MED ORDER — BUSPIRONE HCL 10 MG PO TABS
ORAL_TABLET | ORAL | 0 refills | Status: DC
  Filled 2020-12-07 – 2021-01-02 (×2): qty 180, 90d supply, fill #0

## 2020-12-07 NOTE — Progress Notes (Signed)
Chief Complaint:   OBESITY Rhonda [name] is here to discuss her progress with her obesity treatment plan along with follow-up of her obesity related diagnoses. Rhonda [name] is on the Category 2 Plan and states she is following her eating plan approximately 75% of the time. [name] states she is not exercising regularly at this time.  Today's visit was #: 32 Starting weight: 273 lbs Starting date: 01/09/2018 Today's weight: 247 lbs Today's date: 12/06/2020 Total lbs lost to date: 26 lbs Total lbs lost since last in-office visit: 1 lb  Interim History: Rhonda [name] notes some nausea and constipation with the [facility].  She is a bit disappointed with only a 1 pound weight loss.  She has overeaten a few times, which has led to nausea.  Still using [employer] [place] meals occasionally at supper.  She does not always eat breakfast.  Subjective:   1. Vitamin D deficiency Vitamin D slightly low at 44.0.  On weekly prescription vitamin D.  2. MDD (major depressive disorder), recurrent, in partial remission ([place]) She feels she is doing well overall considering that she works full time, is in college full time, is going through a marital separation and has 2 small children.  On Buspar and bupropion.  Assessment/Plan:   1. Vitamin D deficiency Refill vitamin D 50,000 IU weekly, as per below.  - Refill Vitamin D, Ergocalciferol, (DRISDOL) 1.25 MG (50000 UNIT) CAPS capsule; TAKE 1 CAPSULE BY MOUTH EVERY 3 DAYS.  Dispense: 10 capsule; Refill: 0  2. MDD (major depressive disorder), recurrent, in partial remission ([place]) Continue medications.  3. Obesity: BMI 35  Rhonda [name] is currently in the action stage of change. As such, her goal is to continue with weight loss efforts. She has agreed to the Category 2 Plan.   Have at least 20 grams of protein at mid morning if skipping breakfast.   Exercise goals: No exercise has been prescribed at this time.  Behavioral modification strategies: planning for  success.  Rhonda [name] has agreed to follow-up with our clinic in 3 weeks.   Objective:   Blood pressure 127/85, pulse 67, temperature 98.2 F (36.8 C), height 5\' 10"  (1.778 m), weight 247 lb (112 kg), SpO2 99 %. Body mass index is 35.44 kg/m.  General: Cooperative, alert, well developed, in no acute distress. HEENT: Conjunctivae and lids unremarkable. Cardiovascular: Regular rhythm.  Lungs: Normal work of breathing. Neurologic: No focal deficits.   Lab Results  Component Value Date   CREATININE 1.09 (H) 07/19/2020   BUN 12 07/19/2020   NA 137 07/19/2020   K 4.6 07/19/2020   CL 101 07/19/2020   CO2 22 07/19/2020   Lab Results  Component Value Date   ALT 52 (H) 07/19/2020   AST 37 07/19/2020   ALKPHOS 68 07/19/2020   BILITOT 0.4 07/19/2020   Lab Results  Component Value Date   HGBA1C 5.0 07/19/2020   HGBA1C 5.2 01/25/2020   HGBA1C 5.2 09/21/2019   HGBA1C 5.1 04/20/2019   HGBA1C 5.2 10/09/2018   Lab Results  Component Value Date   INSULIN 34.0 (H) 07/19/2020   INSULIN 9.9 01/25/2020   INSULIN 20.3 09/21/2019   INSULIN 13.8 04/20/2019   INSULIN 13.0 10/09/2018   Lab Results  Component Value Date   TSH 1.470 01/09/2018   Lab Results  Component Value Date   CHOL 153 01/09/2018   HDL 43 01/09/2018   LDLCALC 97 01/09/2018   TRIG 63 01/09/2018   Lab Results  Component Value Date   WBC 4.7  01/09/2018   HGB 14.5 01/09/2018   HCT 42.1 01/09/2018   MCV 89 01/09/2018   PLT 195 09/20/2016   Attestation Statements:   Reviewed by clinician on day of visit: allergies, medications, problem list, medical history, surgical history, family history, social history, and previous encounter notes.  I, Water quality scientist, CMA, am acting as Location manager for [employer], [place].  I have reviewed the above documentation for accuracy and completeness, and I agree with the above. -  Georgianne Fick, FNP

## 2020-12-07 NOTE — Progress Notes (Signed)
Virtual Visit via Video Note  I connected with Rhonda [name] on 12/07/20 at  4:20 PM EDT by a video enabled telemedicine application and verified that I am speaking with the correct person using two identifiers.  Location Provider Location : [facility] Patient Location : Car  Participants: Patient , Provider    I discussed the limitations of evaluation and management by telemedicine and the availability of in person appointments. The patient expressed understanding and agreed to proceed.   I discussed the assessment and treatment plan with the patient. The patient was provided an opportunity to ask questions and all were answered. The patient agreed with the plan and demonstrated an understanding of the instructions.   The patient was advised to call back or seek an in-person evaluation if the symptoms worsen or if the condition fails to improve as anticipated.   [name]  12/07/2020 5:18 PM Rhonda [name]  MRN:  [id number]  Chief Complaint:  Chief Complaint    Follow-up; Anxiety     HPI: Rhonda [name] is a 35 year old Caucasian female, married, employed, has a history of depression, GAD, insomnia was evaluated by telemedicine today.  Patient today reports she is currently doing well.  Denies any significant depression or anxiety.  She does report psychosocial stressors of going through separation with her husband.  She however reports she is coping well.  She reports work is going well.  Patient is compliant on medications as prescribed.  Denies side effects.  Patient denies any suicidality, homicidality or perceptual disturbances.  Patient denies any other concerns today.  Visit Diagnosis:    ICD-10-CM   1. MDD (major depressive disorder), recurrent, in full remission ([place])  F33.42 busPIRone (BUSPAR) 10 MG tablet  2. GAD (generalized anxiety disorder)  F41.1   3. Insomnia due to mental condition  F51.05 buPROPion (WELLBUTRIN XL) 300 MG 24 hr tablet     Past Psychiatric History: I have reviewed past psychiatric history from progress note on 11/05/2018  Past Medical History:  Past Medical History:  Diagnosis Date  . Depression    treated  . PCOS (polycystic ovarian syndrome) 2008  . Plantar fasciitis     Past Surgical History:  Procedure Laterality Date  . CHOLECYSTECTOMY      Family Psychiatric History: I have reviewed family psychiatric history from progress note on 11/05/2018  Family History:  Family History  Problem Relation Age of Onset  . High blood pressure Mother   . Depression Father   . Liver disease Father   . Alcoholism Father   . Alcohol abuse Father   . Diabetes Paternal Grandmother   . Bipolar disorder Maternal Aunt     Social History: Reviewed social history from progress note on 11/05/2018 Social History   Socioeconomic History  . Marital status: Legally Separated    Spouse name: [name]  . Number of children: 2  . Years of education: Not on file  . Highest education level: Bachelor's degree (e.g., BA, AB, BS)  Occupational History  . Occupation: Therapist, sports  Tobacco Use  . Smoking status: Never Smoker  . Smokeless tobacco: Never Used  Vaping Use  . Vaping Use: Never used  Substance and Sexual Activity  . Alcohol use: No  . Drug use: No  . Sexual activity: Yes    Birth control/protection: None  Other Topics Concern  . Not on file  Social History Narrative  . Not on file   Social Determinants of Health   Financial  Resource Strain: Not on file  Food Insecurity: Not on file  Transportation Needs: Not on file  Physical Activity: Not on file  Stress: Not on file  Social Connections: Not on file    Allergies: No Known Allergies  Metabolic Disorder Labs: Lab Results  Component Value Date   HGBA1C 5.0 07/19/2020   No results found for: PROLACTIN Lab Results  Component Value Date   CHOL 153 01/09/2018   TRIG 63 01/09/2018   HDL 43 01/09/2018   LDLCALC 97 01/09/2018   Lab Results   Component Value Date   TSH 1.470 01/09/2018    Therapeutic Level Labs: No results found for: LITHIUM No results found for: VALPROATE No components found for:  CBMZ  Current Medications: Current Outpatient Medications  Medication Sig Dispense Refill  . buPROPion (WELLBUTRIN XL) 300 MG 24 hr tablet TAKE 1 TABLET (300 MG TOTAL) BY MOUTH DAILY. 90 tablet 0  . busPIRone (BUSPAR) 10 MG tablet TAKE 2 TABLETS (20 MG TOTAL) BY MOUTH DAILY WITH BREAKFAST. 180 tablet 0  . cyclobenzaprine (FLEXERIL) 5 MG tablet Take 5 mg by mouth at bedtime as needed.    . cyclobenzaprine (FLEXERIL) 5 MG tablet Take by mouth. (Patient not taking: Reported on 12/07/2020)    . doxepin (SINEQUAN) 10 MG capsule TAKE 1 TO 2 CAPSULES BY MOUTH AT BEDTIME AS NEEDED FOR SLEEP 30 capsule 1  . ELDERBERRY PO Take by mouth.    . fluconazole (DIFLUCAN) 150 MG tablet Take 150 mg by mouth once.    . hydrOXYzine (ATARAX/VISTARIL) 25 MG tablet TAKE 1/2 TO 1 TABLET BY MOUTH TWO TIMES DAILY AS NEEDED FOR ANXIETY. 60 tablet 1  . levocetirizine (XYZAL) 5 MG tablet Take 5 mg by mouth every evening.    Marland Kitchen levonorgestrel (MIRENA) 20 MCG/24HR IUD 1 each by Intrauterine route once.    . Melatonin 5 MG CHEW Chew 5 mg by mouth at bedtime.    . Multiple Vitamin (MULTIVITAMIN) tablet Take 1 tablet by mouth daily.    . Probiotic Product (PROBIOTIC-10 PO) Take by mouth.    . Semaglutide-Weight Management 1.7 MG/0.75ML SOAJ INJECT 1.7 MG INTO THE SKIN ONCE A WEEK. 9 mL 0  . spironolactone (ALDACTONE) 100 MG tablet Take by mouth.    . spironolactone (ALDACTONE) 100 MG tablet TAKE 1 TABLET BY MOUTH ONCE DAILY 90 tablet 3  . triamcinolone (KENALOG) 0.1 % APPLY TO THE AFFECTED AREA(S) 2 TIMES DAILY AS NEEDED FOR ITCHING 60 g 0  . Vitamin D, Ergocalciferol, (DRISDOL) 1.25 MG (50000 UNIT) CAPS capsule TAKE 1 CAPSULE BY MOUTH EVERY 3 DAYS. 10 capsule 0   No current facility-administered medications for this visit.     Musculoskeletal: Strength &  Muscle Tone: UTA Gait & Station: UTA Patient leans: N/A  Psychiatric Specialty Exam: Review of Systems  Psychiatric/Behavioral: The patient is nervous/anxious.   All other systems reviewed and are negative.   There were no vitals taken for this visit.There is no height or weight on file to calculate BMI.  General Appearance: Casual  Eye Contact:  Fair  Speech:  Clear and Coherent  Volume:  Normal  Mood:  Anxious Coping well  Affect:  Congruent  Thought Process:  Goal Directed and Descriptions of Associations: Intact  Orientation:  Full (Time, Place, and Person)  Thought Content: Logical   Suicidal Thoughts:  No  Homicidal Thoughts:  No  Memory:  Immediate;   Fair Recent;   Fair Remote;   Fair  Judgement:  Fair  Insight:  Fair  Psychomotor Activity:  Normal  Concentration:  Concentration: Fair and Attention Span: Fair  Recall:  [employer] of Knowledge: Fair  Language: Fair  Akathisia:  No  Handed:  Right  AIMS (if indicated): UTA  Assets:  Communication Skills Desire for Improvement Housing Social Support  ADL's:  Intact  Cognition: WNL  Sleep:  Fair   Screenings: GAD-7   Flowsheet Row Video Visit from 12/07/2020 in [place]  Total GAD-7 Score 1    PHQ2-9   Flowsheet Row Video Visit from 12/07/2020 in [place] Video Visit from 10/19/2020 in [place] Office Visit from 01/09/2018 in [place]  PHQ-2 Total Score 1 1 3   PHQ-9 Total Score -- -- 13    Flowsheet Row Video Visit from 10/19/2020 in [place] Low Risk       Assessment and Plan: Rhonda [name] is a 35 year old Caucasian female, married, employed, has a history of MDD, GAD was evaluated by telemedicine today.  Patient is biologically predisposed given her family history of mental health problems.  Patient with psychosocial stressors of being  in school, job related stressors, separation from husband.  Patient is currently stable on current medication regimen.  Plan as noted below.  Plan MDD in remission Wellbutrin XL 300 mg p.o. daily BuSpar 10 mg p.o. twice daily L-methylfolate as prescribed  GAD-improving BuSpar 10 mg p.o. twice daily Continue CBT Hydroxyzine 12.5-25 mg p.o. twice daily as needed for severe anxiety symptoms  Insomnia-improving Doxepin 10 to 20 mg p.o. nightly as needed Continue sleep hygiene techniques  Genesight testing results were reviewed while making medication changes.  Follow-up in clinic in 2 to 3 months or sooner if needed.  This note was generated in part or whole with voice recognition software. Voice recognition is usually quite accurate but there are transcription errors that can and very often do occur. I apologize for any typographical errors that were not detected and corrected.        Ursula Alert, MD 12/08/2020, 3:00 PM

## 2020-12-08 ENCOUNTER — Other Ambulatory Visit: Payer: Self-pay

## 2020-12-14 ENCOUNTER — Other Ambulatory Visit: Payer: Self-pay

## 2020-12-20 ENCOUNTER — Other Ambulatory Visit: Payer: Self-pay

## 2020-12-23 ENCOUNTER — Encounter (INDEPENDENT_AMBULATORY_CARE_PROVIDER_SITE_OTHER): Payer: Self-pay | Admitting: Family Medicine

## 2020-12-28 ENCOUNTER — Ambulatory Visit (INDEPENDENT_AMBULATORY_CARE_PROVIDER_SITE_OTHER): Payer: No Typology Code available for payment source | Admitting: Family Medicine

## 2021-01-02 ENCOUNTER — Other Ambulatory Visit: Payer: Self-pay

## 2021-01-03 ENCOUNTER — Other Ambulatory Visit: Payer: Self-pay

## 2021-02-06 ENCOUNTER — Other Ambulatory Visit: Payer: Self-pay

## 2021-02-06 ENCOUNTER — Encounter (INDEPENDENT_AMBULATORY_CARE_PROVIDER_SITE_OTHER): Payer: Self-pay | Admitting: Family Medicine

## 2021-02-06 ENCOUNTER — Ambulatory Visit (INDEPENDENT_AMBULATORY_CARE_PROVIDER_SITE_OTHER): Payer: No Typology Code available for payment source | Admitting: Family Medicine

## 2021-02-06 VITALS — BP 109/74 | HR 70 | Temp 98.2°F | Ht 70.0 in | Wt 242.0 lb

## 2021-02-06 DIAGNOSIS — E88819 Insulin resistance, unspecified: Secondary | ICD-10-CM

## 2021-02-06 DIAGNOSIS — R748 Abnormal levels of other serum enzymes: Secondary | ICD-10-CM

## 2021-02-06 DIAGNOSIS — E559 Vitamin D deficiency, unspecified: Secondary | ICD-10-CM | POA: Diagnosis not present

## 2021-02-06 DIAGNOSIS — E8881 Metabolic syndrome: Secondary | ICD-10-CM

## 2021-02-06 DIAGNOSIS — Z6839 Body mass index (BMI) 39.0-39.9, adult: Secondary | ICD-10-CM

## 2021-02-06 MED ORDER — VITAMIN D (ERGOCALCIFEROL) 1.25 MG (50000 UNIT) PO CAPS
ORAL_CAPSULE | ORAL | 0 refills | Status: DC
  Filled 2021-02-06: qty 10, 30d supply, fill #0

## 2021-02-06 MED ORDER — WEGOVY 2.4 MG/0.75ML ~~LOC~~ SOAJ
2.4000 mg | SUBCUTANEOUS | 0 refills | Status: DC
Start: 2021-02-06 — End: 2021-03-08
  Filled 2021-02-06: qty 3, 28d supply, fill #0

## 2021-02-07 ENCOUNTER — Encounter (INDEPENDENT_AMBULATORY_CARE_PROVIDER_SITE_OTHER): Payer: Self-pay | Admitting: Family Medicine

## 2021-02-07 NOTE — Progress Notes (Signed)
Chief Complaint:   OBESITY Rhonda [name] is here to discuss her progress with her obesity treatment plan along with follow-up of her obesity related diagnoses. Rhonda [name] is on the Category 2 Plan and states she is following her eating plan approximately 75% of the time. Rhonda [name] states she is doing 0 minutes 0 times per week.  Today's visit was #: 66 Starting weight: 273 lbs Starting date: 01/09/2018 Today's weight: 242 lbs Today's date: 02/06/2021 Total lbs lost to date: 31 Total lbs lost since last in-office visit: 5  Interim History: Rhonda [name] notes some increasing hunger recently. She is tolerating Wegovy well. She notes mild nausea with1.7 mg of Wegovy.  Subjective:   1. Vitamin D deficiency Rhonda [name]'s last Vit D level was low at 44.3. She is on 50,000 IU twice per week. Her Vit D level did not hold steady weekly Vit D.  2. Low serum HDL Rhonda [name]'s last HDL was low at 43. Tiglycerides and LDL were within normal limits (LDL 97). She is not on statin.   Lab Results  Component Value Date   ALT 52 (H) 07/19/2020   AST 37 07/19/2020   ALKPHOS 68 07/19/2020   BILITOT 0.4 07/19/2020   Lab Results  Component Value Date   CHOL 153 01/09/2018   HDL 43 01/09/2018   LDLCALC 97 01/09/2018   TRIG 63 01/09/2018   3. Insulin resistance Rhonda [name]'s fasting insulin was elevated at 34, and A1c within normal limits. She is on [employer].   Lab Results  Component Value Date   INSULIN 34.0 (H) 07/19/2020   INSULIN 9.9 01/25/2020   INSULIN 20.3 09/21/2019   INSULIN 13.8 04/20/2019   INSULIN 13.0 10/09/2018   Lab Results  Component Value Date   HGBA1C 5.0 07/19/2020   Assessment/Plan:   1. Vitamin D deficiency  We will check labs today, and we will refill prescription Vitamin D for 1 month.   - Vitamin D, Ergocalciferol, (DRISDOL) 1.25 MG (50000 UNIT) CAPS capsule; TAKE 1 CAPSULE BY MOUTH EVERY 3 DAYS.  Dispense: 10 capsule; Refill: 0 - VITAMIN D 25 Hydroxy (Vit-D Deficiency,  Fractures)  2. Low serum HDL We will check labs today.  - Lipid Panel With LDL/HDL Ratio   3. Insulin resistance Rhonda [name] will continue to work on weight loss, exercise, and decreasing simple carbohydrates to help decrease the risk of diabetes. We will check labs today. Rhonda [name] agreed to follow-up with [name] as directed to closely monitor her progress.  - Comprehensive metabolic panel - Hemoglobin A1c - Insulin, random  4. Obesity: BMI 34.72 Rhonda [name] is currently in the action stage of change. As such, her goal is to continue with weight loss efforts. She has agreed to the Category 2 Plan.   Rhonda [name] Wegovy to 2.4 mg weekly with no refills.  - Semaglutide-Weight Management (WEGOVY) 2.4 MG/0.75ML SOAJ; Inject 2.4 mg into the skin once a week.  Dispense: 3 mL; Refill: 0  Exercise goals: No exercise has been prescribed at this time.  Behavioral modification strategies: increasing lean protein intake.  Rhonda [name] has agreed to follow-up with our clinic in 3 to 4 weeks.  Objective:   Blood pressure 109/74, pulse 70, temperature 98.2 F (36.8 C), height 5\' 10"  (1.778 m), weight 242 lb (109.8 kg), SpO2 97 %. Body mass index is 34.72 kg/m.  General: Cooperative, alert, well developed, in no acute distress. HEENT: Conjunctivae and lids unremarkable. Cardiovascular: Regular rhythm.  Lungs: Normal work of breathing. Neurologic: No focal deficits.   Lab Results  Component Value Date   CREATININE 1.09 (H) 07/19/2020   BUN 12 07/19/2020   NA 137 07/19/2020   K 4.6 07/19/2020   CL 101 07/19/2020   CO2 22 07/19/2020   Lab Results  Component Value Date   ALT 52 (H) 07/19/2020   AST 37 07/19/2020   ALKPHOS 68 07/19/2020   BILITOT 0.4 07/19/2020   Lab Results  Component Value Date   HGBA1C 5.0 07/19/2020   HGBA1C 5.2 01/25/2020   HGBA1C 5.2 09/21/2019   HGBA1C 5.1 04/20/2019   HGBA1C 5.2 10/09/2018   Lab Results  Component Value Date   INSULIN 34.0 (H) 07/19/2020   INSULIN 9.9  01/25/2020   INSULIN 20.3 09/21/2019   INSULIN 13.8 04/20/2019   INSULIN 13.0 10/09/2018   Lab Results  Component Value Date   TSH 1.470 01/09/2018   Lab Results  Component Value Date   CHOL 153 01/09/2018   HDL 43 01/09/2018   LDLCALC 97 01/09/2018   TRIG 63 01/09/2018   Lab Results  Component Value Date   WBC 4.7 01/09/2018   HGB 14.5 01/09/2018   HCT 42.1 01/09/2018   MCV 89 01/09/2018   PLT 195 09/20/2016   No results found for: IRON, TIBC, FERRITIN  Attestation Statements:   Reviewed by clinician on day of visit: allergies, medications, problem list, medical history, surgical history, family history, social history, and previous encounter notes.   Wilhemena Durie, am acting as Location manager for [employer], FNP-C.  I have reviewed the above documentation for accuracy and completeness, and I agree with the above. -  Georgianne Fick, FNP

## 2021-02-09 ENCOUNTER — Other Ambulatory Visit: Payer: Self-pay

## 2021-02-09 MED ORDER — CYCLOBENZAPRINE HCL 5 MG PO TABS
ORAL_TABLET | ORAL | 1 refills | Status: DC
  Filled 2021-02-09: qty 30, 30d supply, fill #0
  Filled 2022-01-31: qty 30, 30d supply, fill #1

## 2021-02-14 ENCOUNTER — Other Ambulatory Visit: Payer: Self-pay

## 2021-02-14 MED ORDER — OSELTAMIVIR PHOSPHATE 75 MG PO CAPS
ORAL_CAPSULE | ORAL | 0 refills | Status: DC
  Filled 2021-02-14: qty 10, 10d supply, fill #0

## 2021-02-24 ENCOUNTER — Telehealth: Payer: Self-pay | Admitting: Family Medicine

## 2021-02-24 ENCOUNTER — Other Ambulatory Visit: Payer: Self-pay

## 2021-02-24 DIAGNOSIS — B3731 Acute candidiasis of vulva and vagina: Secondary | ICD-10-CM

## 2021-02-24 DIAGNOSIS — B373 Candidiasis of vulva and vagina: Secondary | ICD-10-CM

## 2021-02-24 MED ORDER — FLUCONAZOLE 150 MG PO TABS
150.0000 mg | ORAL_TABLET | Freq: Every day | ORAL | 0 refills | Status: DC
  Filled 2021-02-24: qty 1, 1d supply, fill #0

## 2021-02-24 NOTE — Progress Notes (Signed)
[name], [name] are scheduled for a virtual visit with your provider today.    Just as we do with appointments in the office, we must obtain your consent to participate.  Your consent will be active for this visit and any virtual visit you may have with one of our providers in the next 365 days.    If you have a MyChart account, I can also send a copy of this consent to you electronically.  All virtual visits are billed to your insurance company just like a traditional visit in the office.  As this is a virtual visit, video technology does not allow for your provider to perform a traditional examination.  This may limit your provider's ability to fully assess your condition.  If your provider identifies any concerns that need to be evaluated in person or the need to arrange testing such as labs, EKG, etc, we will make arrangements to do so.    Although advances in technology are sophisticated, we cannot ensure that it will always work on either your end or our end.  If the connection with a video visit is poor, we may have to switch to a telephone visit.  With either a video or telephone visit, we are not always able to ensure that we have a secure connection.   I need to obtain your verbal consent now.   Are you willing to proceed with your visit today?   Rhonda [name] has provided verbal consent on 02/24/2021 for a virtual visit (video or telephone).  E-Visit for Vaginal Symptoms  We are sorry that you are not feeling well. Here is how we plan to help! Based on what you shared with me it looks like you: May have a yeast vaginosis  Vaginosis is an inflammation of the vagina that can result in discharge, itching and pain. The cause is usually a change in the normal balance of vaginal bacteria or an infection. Vaginosis can also result from reduced estrogen levels after menopause.  The most common causes of vaginosis are:   Bacterial vaginosis which results from an overgrowth of one on several  organisms that are normally present in your vagina.   Yeast infections which are caused by a naturally occurring fungus called candida.   Vaginal atrophy (atrophic vaginosis) which results from the thinning of the vagina from reduced estrogen levels after menopause.   Trichomoniasis which is caused by a parasite and is commonly transmitted by sexual intercourse.  Factors that increase your risk of developing vaginosis include: Medications, such as antibiotics and steroids Uncontrolled diabetes Use of hygiene products such as bubble bath, vaginal spray or vaginal deodorant Douching Wearing damp or tight-fitting clothing Using an intrauterine device (IUD) for birth control Hormonal changes, such as those associated with pregnancy, birth control pills or menopause Sexual activity Having a sexually transmitted infection  Your treatment plan is A single Diflucan (fluconazole) 150mg  tablet once.  I have electronically sent this prescription into the pharmacy that you have chosen.  Be sure to take all of the medication as directed. Stop taking any medication if you develop a rash, tongue swelling or shortness of breath. Mothers who are breast feeding should consider pumping and discarding their breast milk while on these antibiotics. However, there is no consensus that infant exposure at these doses would be harmful.  Remember that medication creams can weaken latex condoms. Marland Kitchen   HOME CARE:  Good hygiene may prevent some types of vaginosis from recurring and may relieve some  symptoms:  Avoid baths, hot tubs and whirlpool spas. Rinse soap from your outer genital area after a shower, and dry the area well to prevent irritation. Don't use scented or harsh soaps, such as those with deodorant or antibacterial action. Avoid irritants. These include scented tampons and pads. Wipe from front to back after using the toilet. Doing so avoids spreading fecal bacteria to your vagina.  Other things that  may help prevent vaginosis include:  Don't douche. Your vagina doesn't require cleansing other than normal bathing. Repetitive douching disrupts the normal organisms that reside in the vagina and can actually increase your risk of vaginal infection. Douching won't clear up a vaginal infection. Use a latex condom. Both female and female latex condoms may help you avoid infections spread by sexual contact. Wear cotton underwear. Also wear pantyhose with a cotton crotch. If you feel comfortable without it, skip wearing underwear to bed. Yeast thrives in [employer] Your symptoms should improve in the next day or two.  GET HELP RIGHT AWAY IF:  You have pain in your lower abdomen ( pelvic area or over your ovaries) You develop nausea or vomiting You develop a fever Your discharge changes or worsens You have persistent pain with intercourse You develop shortness of breath, a rapid pulse, or you faint.  These symptoms could be signs of problems or infections that need to be evaluated by a medical provider now.  MAKE SURE YOU   Understand these instructions. Will watch your condition. Will get help right away if you are not doing well or get worse.  Thank you for choosing an e-visit.  Your e-visit answers were reviewed by a board certified advanced clinical practitioner to complete your personal care plan. Depending upon the condition, your plan could have included both over the counter or prescription medications.  Please review your pharmacy choice. Make sure the pharmacy is open so you can pick up prescription now. If there is a problem, you may contact your provider through [employer] and have the prescription routed to another pharmacy.  Your safety is important to [name]. If you have drug allergies check your prescription carefully.   For the next 24 hours you can use MyChart to ask questions about today's visit, request a non-urgent call back, or ask for a work or school  excuse. You will get an email in the next two days asking about your experience. I hope that your e-visit has been valuable and will speed your recovery.   I provided 5 minutes of non face-to-face time during this encounter for chart review and documentation.     Perlie Mayo, NP 02/24/2021  1:58 PM

## 2021-03-01 ENCOUNTER — Ambulatory Visit (INDEPENDENT_AMBULATORY_CARE_PROVIDER_SITE_OTHER): Payer: No Typology Code available for payment source | Admitting: Family Medicine

## 2021-03-06 ENCOUNTER — Other Ambulatory Visit: Payer: Self-pay

## 2021-03-06 MED ORDER — SPIRONOLACTONE 100 MG PO TABS
100.0000 mg | ORAL_TABLET | Freq: Every day | ORAL | 3 refills | Status: DC
  Filled 2021-03-06: qty 90, 90d supply, fill #0
  Filled 2021-08-09: qty 90, 90d supply, fill #1
  Filled 2021-12-25: qty 90, 90d supply, fill #2

## 2021-03-07 ENCOUNTER — Other Ambulatory Visit: Payer: Self-pay

## 2021-03-07 ENCOUNTER — Encounter: Payer: Self-pay | Admitting: Psychiatry

## 2021-03-07 ENCOUNTER — Telehealth (INDEPENDENT_AMBULATORY_CARE_PROVIDER_SITE_OTHER): Payer: No Typology Code available for payment source | Admitting: Psychiatry

## 2021-03-07 DIAGNOSIS — F3342 Major depressive disorder, recurrent, in full remission: Secondary | ICD-10-CM | POA: Diagnosis not present

## 2021-03-07 DIAGNOSIS — F411 Generalized anxiety disorder: Secondary | ICD-10-CM

## 2021-03-07 DIAGNOSIS — F5105 Insomnia due to other mental disorder: Secondary | ICD-10-CM | POA: Diagnosis not present

## 2021-03-07 MED ORDER — BUSPIRONE HCL 10 MG PO TABS
ORAL_TABLET | ORAL | 0 refills | Status: DC
  Filled 2021-03-07: qty 180, 90d supply, fill #0

## 2021-03-07 MED ORDER — BUPROPION HCL ER (XL) 300 MG PO TB24
ORAL_TABLET | Freq: Every day | ORAL | 0 refills | Status: DC
  Filled 2021-03-07 – 2021-04-28 (×2): qty 90, 90d supply, fill #0

## 2021-03-07 NOTE — Progress Notes (Signed)
Virtual Visit via Video Note  I connected with Rhonda [name] on 03/07/21 at  4:40 PM EDT by a video enabled telemedicine application and verified that I am speaking with the correct person using two identifiers.  Location Provider Location : [facility] Patient Location : Car  Participants: Patient , Provider   I discussed the limitations of evaluation and management by telemedicine and the availability of in person appointments. The patient expressed understanding and agreed to proceed.    I discussed the assessment and treatment plan with the patient. The patient was provided an opportunity to ask questions and all were answered. The patient agreed with the plan and demonstrated an understanding of the instructions.   The patient was advised to call back or seek an in-person evaluation if the symptoms worsen or if the condition fails to improve as anticipated.   [facility] MD OP Progress Note  03/07/2021 5:06 PM BUFFIE HERNE  MRN:  [id number]  Chief Complaint:  Chief Complaint   Follow-up; Depression; Anxiety    HPI: MAHEALANI SULAK is a 35 year old Caucasian female, separated, employed, has a history of depression, GAD, insomnia was evaluated by telemedicine today.  Patient today reports she is currently struggling with a lot of psychosocial stressors including having to take care of her children who are young, work as well as keeping up with grad school.  She has couple of months more to go and she will be done with school.  She however has been having trouble keeping up with her projects and assignments and procrastinates a lot.  Most of the time this is due to her being overwhelmed with everything going on in her life at this time that she feels exhausted.  She however reports she has been getting help from her husband with whom she is separated.  She denies any significant depressive symptoms.  She however does report sleep problems mostly due to not having a good sleep hygiene.   She also uses a lot of caffeine which does have an impact on her sleep and mood.  She does feel anxious about her current situational stressors.  She is currently compliant on her medications which are helpful.  She reports the hydroxyzine as needed does help her when she needs it.  She uses it rarely.  She denies side effects to medications.  Patient denies any suicidality, homicidality or perceptual disturbances.  Patient continues to follow-up with her therapist Ms. Raelyn Ensign and reports therapy sessions is helpful.  She denies any other concerns today.    Visit Diagnosis:    ICD-10-CM   1. MDD (major depressive disorder), recurrent, in full remission ([place])  F33.42 busPIRone (BUSPAR) 10 MG tablet    2. GAD (generalized anxiety disorder)  F41.1     3. Insomnia due to mental condition  F51.05 buPROPion (WELLBUTRIN XL) 300 MG 24 hr tablet   mood      Past Psychiatric History: Reviewed past psychiatric history from progress note on 11/05/2018  Past Medical History:  Past Medical History:  Diagnosis Date   Depression    treated   PCOS (polycystic ovarian syndrome) 2008   Plantar fasciitis     Past Surgical History:  Procedure Laterality Date   CHOLECYSTECTOMY      Family Psychiatric History: Reviewed family psychiatric history from progress note on 11/05/2018  Family History:  Family History  Problem Relation Age of Onset   High blood pressure Mother    Depression Father    Liver disease  Father    Alcoholism Father    Alcohol abuse Father    Diabetes Paternal Grandmother    Bipolar disorder Maternal Aunt     Social History: Reviewed social history from progress note on 11/05/2018 Social History   Socioeconomic History   Marital status: Legally Separated    Spouse name: [name]   Number of children: 2   Years of education: Not on file   Highest education level: Bachelor's degree (e.g., BA, AB, BS)  Occupational History   Occupation: RN  Tobacco Use    Smoking status: Never   Smokeless tobacco: Never  Vaping Use   Vaping Use: Never used  Substance and Sexual Activity   Alcohol use: No   Drug use: No   Sexual activity: Yes    Birth control/protection: None  Other Topics Concern   Not on file  Social History Narrative   Not on file   Social Determinants of Health   Financial Resource Strain: Not on file  Food Insecurity: Not on file  Transportation Needs: Not on file  Physical Activity: Not on file  Stress: Not on file  Social Connections: Not on file    Allergies: No Known Allergies  Metabolic Disorder Labs: Lab Results  Component Value Date   HGBA1C 5.0 03/07/2021   No results found for: PROLACTIN Lab Results  Component Value Date   CHOL 133 03/07/2021   TRIG 84 03/07/2021   HDL 41 03/07/2021   LDLCALC 76 03/07/2021   LDLCALC 97 01/09/2018   Lab Results  Component Value Date   TSH 2.280 03/07/2021   TSH 1.470 01/09/2018    Therapeutic Level Labs: No results found for: LITHIUM No results found for: VALPROATE No components found for:  CBMZ  Current Medications: Current Outpatient Medications  Medication Sig Dispense Refill   cyclobenzaprine (FLEXERIL) 5 MG tablet Take by mouth.     buPROPion (WELLBUTRIN XL) 300 MG 24 hr tablet TAKE 1 TABLET (300 MG TOTAL) BY MOUTH DAILY. 90 tablet 0   busPIRone (BUSPAR) 10 MG tablet TAKE 2 TABLETS (20 MG TOTAL) BY MOUTH DAILY WITH BREAKFAST. 180 tablet 0   cyclobenzaprine (FLEXERIL) 5 MG tablet Take 1 tablet (5 mg total) by mouth nightly as needed 30 tablet 1   doxepin (SINEQUAN) 10 MG capsule TAKE 1 TO 2 CAPSULES BY MOUTH AT BEDTIME AS NEEDED FOR SLEEP 30 capsule 1   ELDERBERRY PO Take by mouth.     fluconazole (DIFLUCAN) 150 MG tablet Take 1 tablet (150 mg total) by mouth daily. 1 tablet 0   hydrOXYzine (ATARAX/VISTARIL) 25 MG tablet TAKE 1/2 TO 1 TABLET BY MOUTH TWO TIMES DAILY AS NEEDED FOR ANXIETY. 60 tablet 1   levocetirizine (XYZAL) 5 MG tablet Take 5 mg by mouth  every evening.     levonorgestrel (MIRENA) 20 MCG/24HR IUD 1 each by Intrauterine route once.     Multiple Vitamin (MULTIVITAMIN) tablet Take 1 tablet by mouth daily.     oseltamivir (TAMIFLU) 75 MG capsule take 1 capsule by mouth every day for 7 days 10 capsule 0   Probiotic Product (PROBIOTIC-10 PO) Take by mouth.     Semaglutide-Weight Management (WEGOVY) 2.4 MG/0.75ML SOAJ Inject 2.4 mg into the skin once a week. 3 mL 0   spironolactone (ALDACTONE) 100 MG tablet TAKE 1 TABLET BY MOUTH ONCE DAILY 90 tablet 3   Vitamin D, Ergocalciferol, (DRISDOL) 1.25 MG (50000 UNIT) CAPS capsule TAKE 1 CAPSULE BY MOUTH EVERY 3 DAYS. 10 capsule 0  No current facility-administered medications for this visit.     Musculoskeletal: Strength & Muscle Tone:  UTA Gait & Station:  UTA Patient leans: N/A  Psychiatric Specialty Exam: Review of Systems  Psychiatric/Behavioral:  Positive for sleep disturbance. Negative for agitation, behavioral problems, confusion, decreased concentration, dysphoric mood, hallucinations, self-injury and suicidal ideas. The patient is nervous/anxious. The patient is not hyperactive.   All other systems reviewed and are negative.  There were no vitals taken for this visit.There is no height or weight on file to calculate BMI.  General Appearance: Casual  Eye Contact:  Good  Speech:  Clear and Coherent  Volume:  Normal  Mood:  Anxious  Affect:  Congruent  Thought Process:  Goal Directed and Descriptions of Associations: Intact  Orientation:  Full (Time, Place, and Person)  Thought Content: Logical   Suicidal Thoughts:  No  Homicidal Thoughts:  No  Memory:  Immediate;   Fair Recent;   Fair Remote;   Fair  Judgement:  Fair  Insight:  Fair  Psychomotor Activity:  Normal  Concentration:  Concentration: Fair and Attention Span: Fair  Recall:  [employer] of Knowledge: Fair  Language: Fair  Akathisia:  No  Handed:  Right  AIMS (if indicated): not done  Assets:   Communication Skills Desire for Improvement Housing Social Support  ADL's:  Intact  Cognition: WNL  Sleep:  Poor   Screenings: GAD-7    Flowsheet Row Video Visit from 12/07/2020 in [facility]  Total GAD-7 Score 1      PHQ2-9    [place] Video Visit from 03/07/2021 in [facility] Video Visit from 12/07/2020 in [facility] Video Visit from 10/19/2020 in [facility] Visit from 01/09/2018 in [facility]  PHQ-2 Total Score 0 1 1 3   PHQ-9 Total Score -- -- -- 13      Flowsheet Row Video Visit from 03/07/2021 in [facility] Video Visit from 10/19/2020 in [facility] No Risk Low Risk        Assessment and Plan: SAUDIA SMYSER is a 35 year old Caucasian female, separated, employed, has a history of MDD, GAD was evaluated by telemedicine today.  Patient is biologically predisposed given family history of mental health problems.  Patient with psychosocial stressors of job related stressors, separation from husband.  She is currently having anxiety mostly due to her situational stressors and will benefit from continued psychotherapy sessions.  Plan as noted below.  Plan MDD in remission Wellbutrin XL 300 mg p.o. daily BuSpar 10 mg p.o. 3 times daily L-methylfolate as prescribed  GAD-improving BuSpar 10 mg p.o. twice daily Continue CBT Hydroxyzine 12.5-25 mg p.o. twice daily as needed for severe anxiety She is not interested in further medication changes at this time and would like to give therapy a chance.  She will continue to work with her therapist Ms. Cari Sun   Insomnia-unstable Doxepin 10 to 20 mg p.o. nightly as needed Continue sleep hygiene techniques Sleep problems are due to not having a good sleep hygiene. Advised patient to cut back on  caffeine.  Genesight testing results were reviewed while making medication changes  Follow-up in clinic in 2 to 3 months or sooner if needed.  This note was generated in part or whole with voice recognition software. Voice recognition is usually quite accurate but there are transcription errors that can and very often do occur. I  apologize for any typographical errors that were not detected and corrected.      Ursula Alert, MD 03/08/2021, 8:18 AM

## 2021-03-08 ENCOUNTER — Ambulatory Visit (INDEPENDENT_AMBULATORY_CARE_PROVIDER_SITE_OTHER): Payer: No Typology Code available for payment source | Admitting: Family Medicine

## 2021-03-08 ENCOUNTER — Other Ambulatory Visit: Payer: Self-pay

## 2021-03-08 ENCOUNTER — Encounter (INDEPENDENT_AMBULATORY_CARE_PROVIDER_SITE_OTHER): Payer: Self-pay | Admitting: Family Medicine

## 2021-03-08 VITALS — BP 130/83 | HR 71 | Temp 97.6°F | Ht 70.0 in | Wt 240.0 lb

## 2021-03-08 DIAGNOSIS — E8881 Metabolic syndrome: Secondary | ICD-10-CM | POA: Diagnosis not present

## 2021-03-08 DIAGNOSIS — E559 Vitamin D deficiency, unspecified: Secondary | ICD-10-CM

## 2021-03-08 DIAGNOSIS — Z6839 Body mass index (BMI) 39.0-39.9, adult: Secondary | ICD-10-CM | POA: Diagnosis not present

## 2021-03-08 DIAGNOSIS — E88819 Insulin resistance, unspecified: Secondary | ICD-10-CM

## 2021-03-08 LAB — COMPREHENSIVE METABOLIC PANEL
ALT: 38 IU/L — ABNORMAL HIGH (ref 0–32)
AST: 28 IU/L (ref 0–40)
Albumin/Globulin Ratio: 1.8 (ref 1.2–2.2)
Albumin: 4.8 g/dL (ref 3.8–4.8)
Alkaline Phosphatase: 68 IU/L (ref 44–121)
BUN/Creatinine Ratio: 9 (ref 9–23)
BUN: 9 mg/dL (ref 6–20)
Bilirubin Total: 0.5 mg/dL (ref 0.0–1.2)
CO2: 19 mmol/L — ABNORMAL LOW (ref 20–29)
Calcium: 9.4 mg/dL (ref 8.7–10.2)
Chloride: 102 mmol/L (ref 96–106)
Creatinine, Ser: 1.04 mg/dL — ABNORMAL HIGH (ref 0.57–1.00)
Globulin, Total: 2.6 g/dL (ref 1.5–4.5)
Glucose: 81 mg/dL (ref 65–99)
Potassium: 4.3 mmol/L (ref 3.5–5.2)
Sodium: 140 mmol/L (ref 134–144)
Total Protein: 7.4 g/dL (ref 6.0–8.5)
eGFR: 72 mL/min/{1.73_m2} (ref >59)

## 2021-03-08 LAB — LIPID PANEL WITH LDL/HDL RATIO
Cholesterol, Total: 133 mg/dL (ref 100–199)
HDL: 41 mg/dL (ref >39)
LDL Chol Calc (NIH): 76 mg/dL (ref 0–99)
LDL/HDL Ratio: 1.9 ratio (ref 0.0–3.2)
Triglycerides: 84 mg/dL (ref 0–149)
VLDL Cholesterol Cal: 16 mg/dL (ref 5–40)

## 2021-03-08 LAB — HEMOGLOBIN A1C
Est. average glucose Bld gHb Est-mCnc: 97 mg/dL
Hgb A1c MFr Bld: 5 % (ref 4.8–5.6)

## 2021-03-08 LAB — TSH: TSH: 2.28 u[IU]/mL (ref 0.450–4.500)

## 2021-03-08 LAB — INSULIN, RANDOM: INSULIN: 20.8 u[IU]/mL (ref 2.6–24.9)

## 2021-03-08 LAB — T4, FREE: Free T4: 1.14 ng/dL (ref 0.82–1.77)

## 2021-03-08 LAB — VITAMIN D 25 HYDROXY (VIT D DEFICIENCY, FRACTURES): Vit D, 25-Hydroxy: 70 ng/mL (ref 30.0–100.0)

## 2021-03-08 LAB — T3: T3, Total: 119 ng/dL (ref 71–180)

## 2021-03-08 MED ORDER — WEGOVY 2.4 MG/0.75ML ~~LOC~~ SOAJ
2.4000 mg | SUBCUTANEOUS | 0 refills | Status: DC
  Filled 2021-03-08: qty 3, 28d supply, fill #0

## 2021-03-08 MED ORDER — VITAMIN D 125 MCG (5000 UT) PO CAPS: 1.0000 | ORAL_CAPSULE | Freq: Every day | ORAL | 0 refills | Status: DC

## 2021-03-09 ENCOUNTER — Other Ambulatory Visit: Payer: Self-pay

## 2021-03-15 NOTE — Progress Notes (Addendum)
Chief Complaint:   OBESITY Rhonda [name] is here to discuss her progress with her obesity treatment plan along with follow-up of her obesity related diagnoses. Rhonda [name] is on the Category 2 Plan and states she is following her eating plan approximately 50% of the time. Rhonda [name] states she is doing 0 minutes 0 times per week.  Today's visit was #: 33 Starting weight: 273 lbs Starting date: 01/09/2018 Today's weight: 240 lbs Today's date: 03/08/2021 Total lbs lost to date: 33 lbs Total lbs lost since last in-office visit: 2 lbs  Interim History: Rhonda [name] is very busy with school, work, and 2 Programmer, applications. She does not feel she is doing well on plan but still lost 2 lbs. 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  Subjective:   1. Insulin resistance Rhonda [name]'s will increase dose of Wegovy to 2.4 mg at last office visit. Her last A1C is 5.0. She  has diarrhea with fatty foods.  Lab Results  Component Value Date   INSULIN 20.8 03/07/2021   INSULIN 34.0 (H) 07/19/2020   INSULIN 9.9 01/25/2020   INSULIN 20.3 09/21/2019   INSULIN 13.8 04/20/2019   Lab Results  Component Value Date   HGBA1C 5.0 03/07/2021    2. Vitamin D deficiency Rhonda [name] Vitamin D was at goal. She is on weekly Vitamin D.  Lab Results  Component Value Date   HGBA1C 5.0 03/07/2021   HGBA1C 5.0 07/19/2020   HGBA1C 5.2 01/25/2020   Lab Results  Component Value Date   LDLCALC 76 03/07/2021   CREATININE 1.04 (H) 03/07/2021   Lab Results  Component Value Date   INSULIN 20.8 03/07/2021   INSULIN 34.0 (H) 07/19/2020   INSULIN 9.9 01/25/2020   INSULIN 20.3 09/21/2019   INSULIN 13.8 04/20/2019    Assessment/Plan:   1. Insulin resistance Refill:  - Semaglutide-Weight Management (WEGOVY) 2.4 MG/0.75ML SOAJ; Inject 2.4 mg into the skin once a week.  Dispense: 3 mL; Refill: 0  2. Vitamin D deficiency  Rhonda [name] will finish current prescription Vitamin D 50,000 then she agrees to  start OTC Vitamin D 5000 daily and will follow-up for routine testing of Vitamin D, at least 2-3 times per year to avoid over-replacement.  - Cholecalciferol (VITAMIN D) 125 MCG (5000 UT) CAPS; Take 1 capsule by mouth daily.  Dispense: 30 capsule; Refill: 0  3. Obesity: BMI 34.44  Rhonda [name] is currently in the action stage of change. As such, her goal is to continue with weight loss efforts. She has agreed to the Category 2 Plan.   Exercise goals: No exercise has been prescribed at this time.  Behavioral modification strategies: increasing lean protein intake and decreasing simple carbohydrates.  Aslin has agreed to follow-up with our clinic in 3-4 weeks.  Objective:   Blood pressure 130/83, pulse 71, temperature 97.6 F (36.4 C), height '5\' 10"'$  (1.778 m), weight 240 lb (108.9 kg), SpO2 100 %. Body mass index is 34.44 kg/m.  General: Cooperative, alert, well developed, in no acute distress. HEENT: Conjunctivae and lids unremarkable. Cardiovascular: Regular rhythm.  Lungs: Normal work of breathing. Neurologic: No focal deficits.   Lab Results  Component Value Date   CREATININE 1.04 (H) 03/07/2021   BUN 9 03/07/2021   NA 140 03/07/2021   K 4.3 03/07/2021   CL 102 03/07/2021   CO2 19 (L) 03/07/2021   Lab Results  Component Value Date   ALT 38 (H) 03/07/2021   AST 28 03/07/2021  ALKPHOS 68 03/07/2021   BILITOT 0.5 03/07/2021   Lab Results  Component Value Date   HGBA1C 5.0 03/07/2021   HGBA1C 5.0 07/19/2020   HGBA1C 5.2 01/25/2020   HGBA1C 5.2 09/21/2019   HGBA1C 5.1 04/20/2019   Lab Results  Component Value Date   INSULIN 20.8 03/07/2021   INSULIN 34.0 (H) 07/19/2020   INSULIN 9.9 01/25/2020   INSULIN 20.3 09/21/2019   INSULIN 13.8 04/20/2019   Lab Results  Component Value Date   TSH 2.280 03/07/2021   Lab Results  Component Value Date   CHOL 133 03/07/2021   HDL 41 03/07/2021   LDLCALC 76 03/07/2021   TRIG 84 03/07/2021   Lab Results  Component  Value Date   VD25OH 70.0 03/07/2021   VD25OH 44.3 07/19/2020   VD25OH 46.7 01/25/2020   Lab Results  Component Value Date   WBC 4.7 01/09/2018   HGB 14.5 01/09/2018   HCT 42.1 01/09/2018   MCV 89 01/09/2018   PLT 195 09/20/2016   No results found for: IRON, TIBC, FERRITIN  Attestation Statements:   Reviewed by clinician on day of visit: allergies, medications, problem list, medical history, surgical history, family history, social history, and previous encounter notes.  I, Lizbeth Bark, RMA, am acting as Location manager for [employer], [place].  I have reviewed the above documentation for accuracy and completeness, and I agree with the above. -  Georgianne Fick, FNP

## 2021-03-16 ENCOUNTER — Encounter (INDEPENDENT_AMBULATORY_CARE_PROVIDER_SITE_OTHER): Payer: Self-pay | Admitting: Family Medicine

## 2021-04-05 ENCOUNTER — Ambulatory Visit (INDEPENDENT_AMBULATORY_CARE_PROVIDER_SITE_OTHER): Payer: No Typology Code available for payment source | Admitting: Family Medicine

## 2021-04-05 ENCOUNTER — Other Ambulatory Visit: Payer: Self-pay

## 2021-04-05 ENCOUNTER — Encounter (INDEPENDENT_AMBULATORY_CARE_PROVIDER_SITE_OTHER): Payer: Self-pay | Admitting: Family Medicine

## 2021-04-05 VITALS — BP 114/75 | HR 84 | Temp 98.2°F | Ht 70.0 in | Wt 240.0 lb

## 2021-04-05 DIAGNOSIS — E8881 Metabolic syndrome: Secondary | ICD-10-CM

## 2021-04-05 DIAGNOSIS — E559 Vitamin D deficiency, unspecified: Secondary | ICD-10-CM

## 2021-04-05 DIAGNOSIS — Z6839 Body mass index (BMI) 39.0-39.9, adult: Secondary | ICD-10-CM | POA: Diagnosis not present

## 2021-04-05 MED ORDER — WEGOVY 2.4 MG/0.75ML ~~LOC~~ SOAJ
2.4000 mg | SUBCUTANEOUS | 0 refills | Status: DC
  Filled 2021-04-05: qty 3, 28d supply, fill #0

## 2021-04-06 NOTE — Progress Notes (Signed)
Chief Complaint:   OBESITY Rhonda [name] is here to discuss her progress with her obesity treatment plan along with follow-up of her obesity related diagnoses. Rhonda [name] is on the Category 2 Plan and states she is following her eating plan approximately 75% of the time. Rhonda [name] states she is doing 0 minutes 0 times per week.  Today's visit was #: 38 Starting weight: 273 lbs Starting date: 01/09/2018 Today's weight: 240 lbs Today's date: 04/05/2021 Total lbs lost to date: 33 lbs Total lbs lost since last in-office visit: 25 lbs  Interim History: Rhonda [name] notes family stress has caused poor food choices recently. She feels she has gotten complacent with the plan since the Rhonda [name] has worked so well. She feels this is a "wake up" call today that she did not lose weight.  Subjective:   1. Vitamin D deficiency Rhonda [name]'s Vitamin D is at goal (70) on OTC Vitamin D.  Lab Results  Component Value Date   VD25OH 70.0 03/07/2021   VD25OH 44.3 07/19/2020   VD25OH 46.7 01/25/2020    2. Insulin resistance Rhonda [name] is on Wegovy 2.4 mg weekly. She denies nausea and constipation.  Lab Results  Component Value Date   INSULIN 20.8 03/07/2021   INSULIN 34.0 (H) 07/19/2020   INSULIN 9.9 01/25/2020   INSULIN 20.3 09/21/2019   INSULIN 13.8 04/20/2019   Lab Results  Component Value Date   HGBA1C 5.0 03/07/2021    Assessment/Plan:   1. Vitamin D deficiency Arantza agrees to continue to take OTC Vitamin D 5,000 IU every week and will follow-up for routine testing of Vitamin D, at least 2-3 times per year to avoid over-replacement.   2. Insulin resistance Rhonda [name] will continue to work on weight loss, exercise, and decreasing simple carbohydrates to help decrease the risk of diabetes. We will refill Wegovy 2.4 mg weekly for 1 month with no refills. Rhonda [name] agreed to follow-up with Rhonda [name] as directed to closely monitor her progress.  - Semaglutide-Weight Management (WEGOVY) 2.4 MG/0.75ML SOAJ; Inject  2.4 mg into the skin once a week.  Dispense: 3 mL; Refill: 0  3. Obesity: BMI 34.44 Rhonda [name] is currently in the action stage of change. As such, her goal is to continue with weight loss efforts. She has agreed to the Category 2 Plan.   Exercise goals: No exercise has been prescribed at this time.  Behavioral modification strategies: increasing lean protein intake and decreasing simple carbohydrates.  Rhonda [name] has agreed to follow-up with our clinic in 4 weeks. Objective:   Blood pressure 114/75, pulse 84, temperature 98.2 F (36.8 C), height '5\' 10"'$  (1.778 m), weight 240 lb (108.9 kg), SpO2 98 %. Body mass index is 34.44 kg/m.  General: Cooperative, alert, well developed, in no acute distress. HEENT: Conjunctivae and lids unremarkable. Cardiovascular: Regular rhythm.  Lungs: Normal work of breathing. Neurologic: No focal deficits.   Lab Results  Component Value Date   CREATININE 1.04 (H) 03/07/2021   BUN 9 03/07/2021   NA 140 03/07/2021   K 4.3 03/07/2021   CL 102 03/07/2021   CO2 19 (L) 03/07/2021   Lab Results  Component Value Date   ALT 38 (H) 03/07/2021   AST 28 03/07/2021   ALKPHOS 68 03/07/2021   BILITOT 0.5 03/07/2021   Lab Results  Component Value Date   HGBA1C 5.0 03/07/2021   HGBA1C 5.0 07/19/2020   HGBA1C 5.2 01/25/2020   HGBA1C 5.2 09/21/2019   HGBA1C 5.1 04/20/2019   Lab Results  Component Value Date  INSULIN 20.8 03/07/2021   INSULIN 34.0 (H) 07/19/2020   INSULIN 9.9 01/25/2020   INSULIN 20.3 09/21/2019   INSULIN 13.8 04/20/2019   Lab Results  Component Value Date   TSH 2.280 03/07/2021   Lab Results  Component Value Date   CHOL 133 03/07/2021   HDL 41 03/07/2021   LDLCALC 76 03/07/2021   TRIG 84 03/07/2021   Lab Results  Component Value Date   VD25OH 70.0 03/07/2021   VD25OH 44.3 07/19/2020   VD25OH 46.7 01/25/2020   Lab Results  Component Value Date   WBC 4.7 01/09/2018   HGB 14.5 01/09/2018   HCT 42.1 01/09/2018   MCV 89  01/09/2018   PLT 195 09/20/2016   No results found for: IRON, TIBC, FERRITIN  Attestation Statements:   Reviewed by clinician on day of visit: allergies, medications, problem list, medical history, surgical history, family history, social history, and previous encounter notes.  I, Lizbeth Bark, RMA, am acting as Location manager for [employer], [place].   I have reviewed the above documentation for accuracy and completeness, and I agree with the above. -  Georgianne Fick, FNP

## 2021-04-07 ENCOUNTER — Other Ambulatory Visit: Payer: Self-pay

## 2021-04-28 ENCOUNTER — Other Ambulatory Visit: Payer: Self-pay

## 2021-05-04 ENCOUNTER — Other Ambulatory Visit (INDEPENDENT_AMBULATORY_CARE_PROVIDER_SITE_OTHER): Payer: Self-pay | Admitting: Family Medicine

## 2021-05-04 ENCOUNTER — Ambulatory Visit (INDEPENDENT_AMBULATORY_CARE_PROVIDER_SITE_OTHER): Payer: No Typology Code available for payment source | Admitting: Physician Assistant

## 2021-05-04 DIAGNOSIS — E8881 Metabolic syndrome: Secondary | ICD-10-CM

## 2021-05-05 ENCOUNTER — Other Ambulatory Visit: Payer: Self-pay

## 2021-05-05 NOTE — Telephone Encounter (Signed)
Dawn 

## 2021-05-08 ENCOUNTER — Other Ambulatory Visit: Payer: Self-pay

## 2021-05-08 MED ORDER — WEGOVY 2.4 MG/0.75ML ~~LOC~~ SOAJ
2.4000 mg | SUBCUTANEOUS | 0 refills | Status: DC
Start: 2021-05-08 — End: 2021-05-15
  Filled 2021-05-08 – 2021-05-12 (×2): qty 3, 28d supply, fill #0

## 2021-05-08 NOTE — Telephone Encounter (Signed)
LAST APPOINTMENT DATE: 04/05/2021 NEXT APPOINTMENT DATE: 05/15/2021   [facility], [place] [address] Phone: [phone] Fax: [phone]  [place] [address] Phone: [phone] Fax: [place], [place] [address] Phone: [phone] Fax: [phone]  [id number] - [place], [place] - [place] AT [address] [place] [address] Phone: [phone] Fax: [phone]  Patient is requesting a refill of the following medications: Requested Prescriptions   Pending Prescriptions Disp Refills   Semaglutide-Weight Management (WEGOVY) 2.4 MG/0.75ML SOAJ 3 mL 0    Sig: Inject 2.4 mg into the skin once a week.    Date last filled: 04/05/2021 Previously prescribed by [facility]  Lab Results  Component Value Date   HGBA1C 5.0 03/07/2021   HGBA1C 5.0 07/19/2020   HGBA1C 5.2 01/25/2020   Lab Results  Component Value Date   LDLCALC 76 03/07/2021   CREATININE 1.04 (H) 03/07/2021   Lab Results  Component Value Date   VD25OH 70.0 03/07/2021   VD25OH 44.3 07/19/2020   VD25OH 46.7 01/25/2020    BP Readings from Last 3 Encounters:  04/05/21 114/75  03/08/21 130/83  02/06/21 109/74

## 2021-05-09 ENCOUNTER — Encounter (INDEPENDENT_AMBULATORY_CARE_PROVIDER_SITE_OTHER): Payer: Self-pay

## 2021-05-11 ENCOUNTER — Other Ambulatory Visit: Payer: Self-pay

## 2021-05-11 ENCOUNTER — Encounter: Payer: Self-pay | Admitting: Psychiatry

## 2021-05-11 ENCOUNTER — Telehealth (INDEPENDENT_AMBULATORY_CARE_PROVIDER_SITE_OTHER): Payer: No Typology Code available for payment source | Admitting: Psychiatry

## 2021-05-11 DIAGNOSIS — F3342 Major depressive disorder, recurrent, in full remission: Secondary | ICD-10-CM | POA: Diagnosis not present

## 2021-05-11 DIAGNOSIS — F411 Generalized anxiety disorder: Secondary | ICD-10-CM

## 2021-05-11 DIAGNOSIS — F5105 Insomnia due to other mental disorder: Secondary | ICD-10-CM | POA: Diagnosis not present

## 2021-05-11 MED ORDER — BUSPIRONE HCL 15 MG PO TABS
15.0000 mg | ORAL_TABLET | Freq: Two times a day (BID) | ORAL | 1 refills | Status: DC
Start: 2021-05-11 — End: 2021-06-07
  Filled 2021-05-11: qty 60, 30d supply, fill #0

## 2021-05-11 MED ORDER — BUPROPION HCL ER (XL) 300 MG PO TB24
ORAL_TABLET | Freq: Every day | ORAL | 0 refills | Status: DC
  Filled 2021-05-11 – 2021-08-09 (×2): qty 90, 90d supply, fill #0

## 2021-05-11 NOTE — Progress Notes (Signed)
Virtual Visit via Video Note  I connected with Rhonda [name] on 05/11/21 at  4:00 PM EDT by a video enabled telemedicine application and verified that I am speaking with the correct person using two identifiers.  Location Provider Location : [facility] Patient Location : Home  Participants: Patient , Provider    I discussed the limitations of evaluation and management by telemedicine and the availability of in person appointments. The patient expressed understanding and agreed to proceed.    I discussed the assessment and treatment plan with the patient. The patient was provided an opportunity to ask questions and all were answered. The patient agreed with the plan and demonstrated an understanding of the instructions.   The patient was advised to call back or seek an in-person evaluation if the symptoms worsen or if the condition fails to improve as anticipated.   Rhonda [name] OP Progress Note  05/11/2021 4:09 PM Rhonda [name]  MRN:  [id number]  Chief Complaint:  Chief Complaint   Follow-up; Anxiety    HPI: Rhonda [name] is a 35 year old Caucasian female, separated, employed, has a history of depression, GAD, insomnia was evaluated by telemedicine today.  Patient today reports she is currently struggling with relationship struggles with her ex-husband with whom she is separated.  They are currently trying to find a mediator to settle their differences about child care and custody.  Patient became tearful when she discussed this.  Patient reports this does make her anxious.  She feels nervous, worries a lot, and is tearful at times.  Patient however denies any depression, reports she is able to take care of herself, continues to be in school, managing work and school okay.  Patient reports sleep is overall okay.  She does not take the doxepin.  She does take hydroxyzine on and off which helps.  She denies any suicidality, homicidality or perceptual disturbances.  She continues  to follow-up with her therapist and reports therapy sessions are helpful.  Patient does have good support system.  Her mother has been very helpful.  She has started dating however reports she tries to keep that separate from her children for now.  Patient denies any other concerns today.    Visit Diagnosis:    ICD-10-CM   1. MDD (major depressive disorder), recurrent, in full remission (Rhonda [name])  F33.42 busPIRone (BUSPAR) 15 MG tablet    2. GAD (generalized anxiety disorder)  F41.1 busPIRone (BUSPAR) 15 MG tablet    3. Insomnia due to mental condition  F51.05 buPROPion (WELLBUTRIN XL) 300 MG 24 hr tablet   mood      Past Psychiatric History: Reviewed past psychiatric history from progress note on 11/05/2018  Past Medical History:  Past Medical History:  Diagnosis Date   Depression    treated   PCOS (polycystic ovarian syndrome) 2008   Plantar fasciitis     Past Surgical History:  Procedure Laterality Date   CHOLECYSTECTOMY      Family Psychiatric History: Reviewed family psychiatric history from progress note on 11/05/2018  Family History:  Family History  Problem Relation Age of Onset   High blood pressure Mother    Depression Father    Liver disease Father    Alcoholism Father    Alcohol abuse Father    Diabetes Paternal Grandmother    Bipolar disorder Maternal Aunt     Social History: Reviewed social history from progress note on 11/05/2018 Social History   Socioeconomic History   Marital status: Legally Separated  Spouse name: [name]   Number of children: 2   Years of education: Not on file   Highest education level: Bachelor's degree (e.g., BA, AB, BS)  Occupational History   Occupation: RN  Tobacco Use   Smoking status: Never   Smokeless tobacco: Never  Vaping Use   Vaping Use: Never used  Substance and Sexual Activity   Alcohol use: No   Drug use: No   Sexual activity: Yes    Birth control/protection: None  Other Topics Concern   Not on  file  Social History Narrative   Not on file   Social Determinants of Health   Financial Resource Strain: Not on file  Food Insecurity: Not on file  Transportation Needs: Not on file  Physical Activity: Not on file  Stress: Not on file  Social Connections: Not on file    Allergies: No Known Allergies  Metabolic Disorder Labs: Lab Results  Component Value Date   HGBA1C 5.0 03/07/2021   No results found for: PROLACTIN Lab Results  Component Value Date   CHOL 133 03/07/2021   TRIG 84 03/07/2021   HDL 41 03/07/2021   LDLCALC 76 03/07/2021   LDLCALC 97 01/09/2018   Lab Results  Component Value Date   TSH 2.280 03/07/2021   TSH 1.470 01/09/2018    Therapeutic Level Labs: No results found for: LITHIUM No results found for: VALPROATE No components found for:  CBMZ  Current Medications: Current Outpatient Medications  Medication Sig Dispense Refill   busPIRone (BUSPAR) 15 MG tablet Take 1 tablet (15 mg total) by mouth 2 (two) times daily. 60 tablet 1   buPROPion (WELLBUTRIN XL) 300 MG 24 hr tablet TAKE 1 TABLET (300 MG TOTAL) BY MOUTH DAILY. 90 tablet 0   Cholecalciferol (VITAMIN D) 125 MCG (5000 UT) CAPS Take 1 capsule by mouth daily. 30 capsule 0   cyclobenzaprine (FLEXERIL) 5 MG tablet Take 1 tablet (5 mg total) by mouth nightly as needed 30 tablet 1   cyclobenzaprine (FLEXERIL) 5 MG tablet Take by mouth.     doxepin (SINEQUAN) 10 MG capsule TAKE 1 TO 2 CAPSULES BY MOUTH AT BEDTIME AS NEEDED FOR SLEEP (Patient not taking: Reported on 05/11/2021) 30 capsule 1   ELDERBERRY PO Take by mouth.     fluconazole (DIFLUCAN) 150 MG tablet Take 1 tablet (150 mg total) by mouth daily. 1 tablet 0   hydrOXYzine (ATARAX/VISTARIL) 25 MG tablet TAKE 1/2 TO 1 TABLET BY MOUTH TWO TIMES DAILY AS NEEDED FOR ANXIETY. 60 tablet 1   levocetirizine (XYZAL) 5 MG tablet Take 5 mg by mouth every evening.     levonorgestrel (MIRENA) 20 MCG/24HR IUD 1 each by Intrauterine route once.     Multiple  Vitamin (MULTIVITAMIN) tablet Take 1 tablet by mouth daily.     oseltamivir (TAMIFLU) 75 MG capsule take 1 capsule by mouth every day for 7 days 10 capsule 0   Probiotic Product (PROBIOTIC-10 PO) Take by mouth.     Semaglutide-Weight Management (WEGOVY) 2.4 MG/0.75ML SOAJ Inject 2.4 mg into the skin once a week. 3 mL 0   spironolactone (ALDACTONE) 100 MG tablet TAKE 1 TABLET BY MOUTH ONCE DAILY 90 tablet 3   No current facility-administered medications for this visit.     Musculoskeletal: Strength & Muscle Tone:  UTA Gait & Station:  UTA Patient leans: N/A  Psychiatric Specialty Exam: Review of Systems  Psychiatric/Behavioral:  The patient is nervous/anxious.   All other systems reviewed and are negative.  There  were no vitals taken for this visit.There is no height or weight on file to calculate BMI.  General Appearance: Casual  Eye Contact:  Good  Speech:  Clear and Coherent  Volume:  Normal  Mood:  Anxious  Affect:  Congruent and Tearful  Thought Process:  Goal Directed and Descriptions of Associations: Intact  Orientation:  Full (Time, Place, and Person)  Thought Content: Logical   Suicidal Thoughts:  No  Homicidal Thoughts:  No  Memory:  Immediate;   Fair Recent;   Fair Remote;   Fair  Judgement:  Fair  Insight:  Fair  Psychomotor Activity:  Normal  Concentration:  Concentration: Fair and Attention Span: Fair  Recall:  [employer] of Knowledge: Fair  Language: Fair  Akathisia:  No  Handed:  Right  AIMS (if indicated): done  Assets:  Communication Skills Desire for Improvement Housing Social Support  ADL's:  Intact  Cognition: WNL  Sleep:  Fair   Screenings: GAD-7    Flowsheet Row Video Visit from 12/07/2020 in [place]  Total GAD-7 Score 1      PHQ2-9    [place] Video Visit from 03/07/2021 in [place] Video Visit from 12/07/2020 in [place] Video Visit  from 10/19/2020 in [place] Visit from 01/09/2018 in [place]  PHQ-2 Total Score 0 1 1 3   PHQ-9 Total Score -- -- -- 13      Flowsheet Row Video Visit from 03/07/2021 in [place] Video Visit from 10/19/2020 in [facility] No Risk Low Risk        Assessment and Plan: AVAREY YAEGER is a 35 year old Caucasian female, separated, employed, has a history of MDD, GAD was evaluated by telemedicine today.  Patient is biologically predisposed given her family history of mental health problems.  Patient with current psychosocial stressors of separation from husband, she will continue to benefit from medication management and psychotherapy sessions given her recent anxiety symptoms.  Plan as noted below.  Plan MDD in remission Wellbutrin XL 300 mg p.o. daily L-methylfolate as prescribed  GAD-unstable Increase BuSpar to 15 mg p.o. twice daily Continue CBT with Ms.Cari Sun  Insomnia-stable Doxepin 10 to 20 mg p.o. nightly as needed-she does not take it. She has been using hydroxyzine as needed when she needs some help with sleep and that has been beneficial.  Genesight testing results were reviewed while making medication changes.  Follow-up in clinic in 4 weeks or sooner if needed.  This note was generated in part or whole with voice recognition software. Voice recognition is usually quite accurate but there are transcription errors that can and very often do occur. I apologize for any typographical errors that were not detected and corrected.      Ursula Alert, [name] 05/12/2021, 8:58 AM

## 2021-05-12 ENCOUNTER — Other Ambulatory Visit: Payer: Self-pay

## 2021-05-15 ENCOUNTER — Other Ambulatory Visit: Payer: Self-pay

## 2021-05-15 ENCOUNTER — Ambulatory Visit (INDEPENDENT_AMBULATORY_CARE_PROVIDER_SITE_OTHER): Payer: No Typology Code available for payment source | Admitting: Family Medicine

## 2021-05-15 ENCOUNTER — Encounter (INDEPENDENT_AMBULATORY_CARE_PROVIDER_SITE_OTHER): Payer: Self-pay | Admitting: Family Medicine

## 2021-05-15 VITALS — BP 113/74 | HR 82 | Temp 97.7°F | Ht 70.0 in | Wt 236.0 lb

## 2021-05-15 DIAGNOSIS — E88819 Insulin resistance, unspecified: Secondary | ICD-10-CM

## 2021-05-15 DIAGNOSIS — E559 Vitamin D deficiency, unspecified: Secondary | ICD-10-CM | POA: Diagnosis not present

## 2021-05-15 DIAGNOSIS — E8881 Metabolic syndrome: Secondary | ICD-10-CM | POA: Diagnosis not present

## 2021-05-15 DIAGNOSIS — Z6839 Body mass index (BMI) 39.0-39.9, adult: Secondary | ICD-10-CM | POA: Diagnosis not present

## 2021-05-15 MED ORDER — TIRZEPATIDE 12.5 MG/0.5ML ~~LOC~~ SOAJ
12.5000 mg | SUBCUTANEOUS | 0 refills | Status: DC
  Filled 2021-05-15: qty 6, 84d supply, fill #0
  Filled 2021-05-15: qty 2, 28d supply, fill #0

## 2021-05-16 ENCOUNTER — Encounter (INDEPENDENT_AMBULATORY_CARE_PROVIDER_SITE_OTHER): Payer: Self-pay

## 2021-05-16 ENCOUNTER — Other Ambulatory Visit: Payer: Self-pay

## 2021-05-16 NOTE — Progress Notes (Signed)
Chief Complaint:   OBESITY Rhonda [name] is here to discuss her progress with her obesity treatment plan along with follow-up of her obesity related diagnoses. Rhonda [name] is on the Category 2 Plan and states she is following her eating plan approximately 75% of the time. Rhonda [name] states she is doing 0 minutes 0 times per week.  Today's visit was #: 46 Starting weight: 273 lbs Starting date: 01/09/2018 Today's weight: 236 lbs Today's date: 05/15/2021 Total lbs lost to date: 37 lbs Total lbs lost since last in-office visit: 4 lbs  Interim History: Rhonda [name] is eating out quite a bit. However, she has been been meal prepping more the past week. She finds this easier when her kids are with their dad.  She is down 4 lbs today for a total of 37 lbs lost.  Subjective:   1. Insulin resistance Rhonda [name] does not feel the [name] is working as well as it was for appetite suppression.   Lab Results  Component Value Date   INSULIN 20.8 03/07/2021   INSULIN 34.0 (H) 07/19/2020   INSULIN 9.9 01/25/2020   INSULIN 20.3 09/21/2019   INSULIN 13.8 04/20/2019   Lab Results  Component Value Date   HGBA1C 5.0 03/07/2021    2. Vitamin D deficiency Rhonda [name]'s Vitamin D is at goal. She is on 5000 IU Vitamin D daily.  Lab Results  Component Value Date   VD25OH 70.0 03/07/2021   VD25OH 44.3 07/19/2020   VD25OH 46.7 01/25/2020    Assessment/Plan:   1. Insulin resistance Rhonda [name] agreed to start [name] 12.5 mg weekly with no refills.  - tirzepatide ([name]) 12.5 MG/0.5ML Pen; Inject 12.5 mg into the skin once a week.  Dispense: 6 mL; Refill: 0  2. Vitamin D deficiency  Rhonda [name] will continue OTC Vitamin D 5,000 IU daily and she will follow-up for routine testing of Vitamin D, at least 2-3 times per year to avoid over-replacement.   3. Obesity: BMI 33.86 Rhonda [name] is currently in the action stage of change. As such, her goal is to continue with weight loss efforts. She has agreed to the Category 2  Plan.   Exercise goals: No exercise has been prescribed at this time.  Behavioral modification strategies: decreasing eating out.  Rhonda [name] has agreed to follow-up with our clinic in 4 weeks.  Objective:   Blood pressure 113/74, pulse 82, temperature 97.7 F (36.5 C), height 5\' 10"  (1.778 m), weight 236 lb (107 kg), SpO2 98 %. Body mass index is 33.86 kg/m.  General: Cooperative, alert, well developed, in no acute distress. HEENT: Conjunctivae and lids unremarkable. Cardiovascular: Regular rhythm.  Lungs: Normal work of breathing. Neurologic: No focal deficits.   Lab Results  Component Value Date   CREATININE 1.04 (H) 03/07/2021   BUN 9 03/07/2021   NA 140 03/07/2021   K 4.3 03/07/2021   CL 102 03/07/2021   CO2 19 (L) 03/07/2021   Lab Results  Component Value Date   ALT 38 (H) 03/07/2021   AST 28 03/07/2021   ALKPHOS 68 03/07/2021   BILITOT 0.5 03/07/2021   Lab Results  Component Value Date   HGBA1C 5.0 03/07/2021   HGBA1C 5.0 07/19/2020   HGBA1C 5.2 01/25/2020   HGBA1C 5.2 09/21/2019   HGBA1C 5.1 04/20/2019   Lab Results  Component Value Date   INSULIN 20.8 03/07/2021   INSULIN 34.0 (H) 07/19/2020   INSULIN 9.9 01/25/2020   INSULIN 20.3 09/21/2019   INSULIN 13.8 04/20/2019   Lab Results  Component Value  Date   TSH 2.280 03/07/2021   Lab Results  Component Value Date   CHOL 133 03/07/2021   HDL 41 03/07/2021   LDLCALC 76 03/07/2021   TRIG 84 03/07/2021   Lab Results  Component Value Date   VD25OH 70.0 03/07/2021   VD25OH 44.3 07/19/2020   VD25OH 46.7 01/25/2020   Lab Results  Component Value Date   WBC 4.7 01/09/2018   HGB 14.5 01/09/2018   HCT 42.1 01/09/2018   MCV 89 01/09/2018   PLT 195 09/20/2016   No results found for: IRON, TIBC, FERRITIN  Attestation Statements:   Reviewed by clinician on day of visit: allergies, medications, problem list, medical history, surgical history, family history, social history, and previous encounter  notes.  I, Lizbeth Bark, RMA, am acting as Location manager for [employer], [place].   I have reviewed the above documentation for accuracy and completeness, and I agree with the above. -  Georgianne Fick, FNP

## 2021-06-07 ENCOUNTER — Encounter: Payer: Self-pay | Admitting: Psychiatry

## 2021-06-07 ENCOUNTER — Other Ambulatory Visit: Payer: Self-pay

## 2021-06-07 ENCOUNTER — Telehealth (INDEPENDENT_AMBULATORY_CARE_PROVIDER_SITE_OTHER): Payer: No Typology Code available for payment source | Admitting: Psychiatry

## 2021-06-07 DIAGNOSIS — F5105 Insomnia due to other mental disorder: Secondary | ICD-10-CM

## 2021-06-07 DIAGNOSIS — F3342 Major depressive disorder, recurrent, in full remission: Secondary | ICD-10-CM | POA: Diagnosis not present

## 2021-06-07 DIAGNOSIS — F411 Generalized anxiety disorder: Secondary | ICD-10-CM

## 2021-06-07 MED ORDER — BUSPIRONE HCL 15 MG PO TABS
15.0000 mg | ORAL_TABLET | Freq: Two times a day (BID) | ORAL | 0 refills | Status: DC
  Filled 2021-06-07: qty 180, 90d supply, fill #0

## 2021-06-07 NOTE — Progress Notes (Signed)
Virtual Visit via Video Note  I connected with Rhonda [name] on 06/07/21 at  4:00 PM EDT by a video enabled telemedicine application and verified that I am speaking with the correct person using two identifiers.  Location Provider Location : [facility] Patient Location : [place]  Participants: Patient , Provider    I discussed the limitations of evaluation and management by telemedicine and the availability of in person appointments. The patient expressed understanding and agreed to proceed.    I discussed the assessment and treatment plan with the patient. The patient was provided an opportunity to ask questions and all were answered. The patient agreed with the plan and demonstrated an understanding of the instructions.   The patient was advised to call back or seek an in-person evaluation if the symptoms worsen or if the condition fails to improve as anticipated.    [place] MD OP Progress Note  06/07/2021 4:16 PM Rhonda [name]  MRN:  [id number]  Chief Complaint:  Chief Complaint   Follow-up; Anxiety; Depression    HPI: Rhonda [name] is a 35 year old Caucasian female, separated, employed, has a history of depression, GAD, insomnia was evaluated by telemedicine today.  Patient reports since being on the BuSpar higher dosage her mood symptoms have improved.  She does not seem to be too depressed or anxious.  Anxiety is more manageable.  Sleep is okay.  Reports appetite is fair.  She does have struggles doing work in school at the same time however her work has been very understanding and that helps.  She was able to come up with an amicable solution with regards to child care and custody with her ex-husband.  That has also helped her a lot.  She continues to follow-up with her therapist on a regular basis.  Denies suicidality, homicidality or perceptual disturbances.  Patient denies any other concerns today.  Visit Diagnosis:    ICD-10-CM   1. MDD (major depressive  disorder), recurrent, in full remission ([place])  F33.42 busPIRone (BUSPAR) 15 MG tablet    2. GAD (generalized anxiety disorder)  F41.1 busPIRone (BUSPAR) 15 MG tablet    3. Insomnia due to mental condition  F51.05    mood      Past Psychiatric History: Reviewed past psychiatric history from progress note on 11/05/2018  Past Medical History:  Past Medical History:  Diagnosis Date   Depression    treated   PCOS (polycystic ovarian syndrome) 2008   Plantar fasciitis     Past Surgical History:  Procedure Laterality Date   CHOLECYSTECTOMY      Family Psychiatric History: Reviewed family psychiatric history from progress note on 11/05/2018  Family History:  Family History  Problem Relation Age of Onset   High blood pressure Mother    Depression Father    Liver disease Father    Alcoholism Father    Alcohol abuse Father    Diabetes Paternal Grandmother    Bipolar disorder Maternal Aunt     Social History: Reviewed social history from progress note on 11/05/2018 Social History   Socioeconomic History   Marital status: Legally Separated    Spouse name: [name]   Number of children: 2   Years of education: Not on file   Highest education level: Bachelor's degree (e.g., BA, AB, BS)  Occupational History   Occupation: RN  Tobacco Use   Smoking status: Never   Smokeless tobacco: Never  Vaping Use   Vaping Use: Never used  Substance and Sexual Activity  Alcohol use: No   Drug use: No   Sexual activity: Yes    Birth control/protection: None  Other Topics Concern   Not on file  Social History Narrative   Not on file   Social Determinants of Health   Financial Resource Strain: Not on file  Food Insecurity: Not on file  Transportation Needs: Not on file  Physical Activity: Not on file  Stress: Not on file  Social Connections: Not on file    Allergies: No Known Allergies  Metabolic Disorder Labs: Lab Results  Component Value Date   HGBA1C 5.0 03/07/2021    No results found for: PROLACTIN Lab Results  Component Value Date   CHOL 133 03/07/2021   TRIG 84 03/07/2021   HDL 41 03/07/2021   LDLCALC 76 03/07/2021   LDLCALC 97 01/09/2018   Lab Results  Component Value Date   TSH 2.280 03/07/2021   TSH 1.470 01/09/2018    Therapeutic Level Labs: No results found for: LITHIUM No results found for: VALPROATE No components found for:  CBMZ  Current Medications: Current Outpatient Medications  Medication Sig Dispense Refill   buPROPion (WELLBUTRIN XL) 300 MG 24 hr tablet TAKE 1 TABLET (300 MG TOTAL) BY MOUTH DAILY. 90 tablet 0   busPIRone (BUSPAR) 15 MG tablet Take 1 tablet (15 mg total) by mouth 2 (two) times daily. 180 tablet 0   Cholecalciferol (VITAMIN D) 125 MCG (5000 UT) CAPS Take 1 capsule by mouth daily. 30 capsule 0   cyclobenzaprine (FLEXERIL) 5 MG tablet Take 1 tablet (5 mg total) by mouth nightly as needed 30 tablet 1   cyclobenzaprine (FLEXERIL) 5 MG tablet Take by mouth.     doxepin (SINEQUAN) 10 MG capsule TAKE 1 TO 2 CAPSULES BY MOUTH AT BEDTIME AS NEEDED FOR SLEEP 30 capsule 1   ELDERBERRY PO Take by mouth.     fluconazole (DIFLUCAN) 150 MG tablet Take 1 tablet (150 mg total) by mouth daily. 1 tablet 0   hydrOXYzine (ATARAX/VISTARIL) 25 MG tablet TAKE 1/2 TO 1 TABLET BY MOUTH TWO TIMES DAILY AS NEEDED FOR ANXIETY. 60 tablet 1   levocetirizine (XYZAL) 5 MG tablet Take 5 mg by mouth every evening.     levonorgestrel (MIRENA) 20 MCG/24HR IUD 1 each by Intrauterine route once.     Multiple Vitamin (MULTIVITAMIN) tablet Take 1 tablet by mouth daily.     oseltamivir (TAMIFLU) 75 MG capsule take 1 capsule by mouth every day for 7 days 10 capsule 0   Probiotic Product (PROBIOTIC-10 PO) Take by mouth.     spironolactone (ALDACTONE) 100 MG tablet TAKE 1 TABLET BY MOUTH ONCE DAILY 90 tablet 3   tirzepatide ([name]) 12.5 MG/0.5ML Pen Inject 12.5 mg into the skin once a week. 6 mL 0   No current facility-administered medications  for this visit.     Musculoskeletal: Strength & Muscle Tone:  UTA Gait & Station:  Seated Patient leans: N/A  Psychiatric Specialty Exam: Review of Systems  Psychiatric/Behavioral:  The patient is nervous/anxious.   All other systems reviewed and are negative.  There were no vitals taken for this visit.There is no height or weight on file to calculate BMI.  General Appearance: Casual  Eye Contact:  Fair  Speech:  Clear and Coherent  Volume:  Normal  Mood:  Anxious  Affect:  Congruent  Thought Process:  Goal Directed and Descriptions of Associations: Intact  Orientation:  Full (Time, Place, and Person)  Thought Content: Logical   Suicidal Thoughts:  No  Homicidal Thoughts:  No  Memory:  Immediate;   Fair Recent;   Fair Remote;   Fair  Judgement:  Fair  Insight:  Fair  Psychomotor Activity:  Normal  Concentration:  Concentration: Fair and Attention Span: Fair  Recall:  [employer] of Knowledge: Fair  Language: Fair  Akathisia:  No  Handed:  Right  AIMS (if indicated): done  Assets:  Communication Skills Desire for [place] Talents/Skills Transportation Vocational/Educational  ADL's:  Intact  Cognition: WNL  Sleep:  Fair   Screenings: GAD-7    Flowsheet Row Video Visit from 12/07/2020 in [facility]  Total GAD-7 Score 1      PHQ2-9    [place] Video Visit from 03/07/2021 in [facility] Video Visit from 12/07/2020 in [place] Video Visit from 10/19/2020 in [facility] Visit from 01/09/2018 in [facility]  PHQ-2 Total Score 0 1 1 3   PHQ-9 Total Score -- -- -- 13      Flowsheet Row Video Visit from 03/07/2021 in [place] Video Visit from 10/19/2020 in [facility] No Risk Low Risk        Assessment and Plan:  Rhonda [name] is a 35 year old Caucasian female, separated, employed, has a history of MDD, GAD was evaluated by telemedicine today.  Patient is currently stable.  Plan as noted below.  Plan MDD in remission Wellbutrin XL 300 mg p.o. daily L-methylfolate as prescribed  GAD-improving BuSpar 15 mg p.o. twice daily Continue CBT with Ms. Cari Sun   Insomnia-stable Doxepin 10 to 20 mg p.o. nightly as needed.  She does not use it anymore.  Continue sleep hygiene techniques  Genesight testing results were reviewed while making med changes.  Follow-up in clinic in 3 months or sooner in office.  This note was generated in part or whole with voice recognition software. Voice recognition is usually quite accurate but there are transcription errors that can and very often do occur. I apologize for any typographical errors that were not detected and corrected.     Ursula Alert, MD 06/08/2021, 9:17 AM

## 2021-06-12 ENCOUNTER — Ambulatory Visit (INDEPENDENT_AMBULATORY_CARE_PROVIDER_SITE_OTHER): Payer: No Typology Code available for payment source | Admitting: Adult Health

## 2021-06-12 ENCOUNTER — Encounter (INDEPENDENT_AMBULATORY_CARE_PROVIDER_SITE_OTHER): Payer: Self-pay | Admitting: Adult Health

## 2021-06-12 ENCOUNTER — Other Ambulatory Visit: Payer: Self-pay

## 2021-06-12 VITALS — BP 118/78 | HR 68 | Temp 98.0°F | Ht 70.0 in | Wt 234.0 lb

## 2021-06-12 DIAGNOSIS — Z9189 Other specified personal risk factors, not elsewhere classified: Secondary | ICD-10-CM | POA: Diagnosis not present

## 2021-06-12 DIAGNOSIS — Z6839 Body mass index (BMI) 39.0-39.9, adult: Secondary | ICD-10-CM | POA: Diagnosis not present

## 2021-06-12 DIAGNOSIS — E8881 Metabolic syndrome: Secondary | ICD-10-CM | POA: Diagnosis not present

## 2021-06-12 MED ORDER — TIRZEPATIDE 12.5 MG/0.5ML ~~LOC~~ SOAJ
12.5000 mg | SUBCUTANEOUS | 0 refills | Status: DC
  Filled 2021-06-12: qty 6, 84d supply, fill #0

## 2021-06-13 ENCOUNTER — Other Ambulatory Visit: Payer: Self-pay

## 2021-06-13 NOTE — Progress Notes (Signed)
Chief Complaint:   OBESITY Rhonda [name] is here to discuss her progress with her obesity treatment plan along with follow-up of her obesity related diagnoses. Rhonda [name] is on the Category 2 Plan and states she is following her eating plan approximately 75% of the time. Rhonda [name] states she is not exercising regularly at this time.  Today's visit was #: 70 Starting weight: 273 lbs Starting date: 01/09/2018 Today's weight: 234 lbs Today's date: 06/12/2021 Total lbs lost to date: 39 lbs Total lbs lost since last in-office visit: 2 lbs  Interim History: Rhonda [name] was previously on Wegovy, then converted to [name] 12.5 mg.  Conversion due to breakthrough polyphagia. When she eats off plan - eats out, ie: [place] food.  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  Subjective:   1. Insulin resistance On 03/07/2021, insulin level 20.8, improved from 34.0 on 07/19/2020. Rhonda [name] was previously on Wegovy, then converted to [name] 12.5 mg.  Conversion due to breakthrough polyphagia. She denies mass in neck, dysphagia, dyspepsia, or persistent hoarseness. She again is experiencing breakthrough polyphagia.  2. At risk for nausea Rhonda [name] is at risk for nausea due to GIP/GLP-1 therapy for insulin resistance.  Assessment/Plan:   1. Insulin resistance If polyphagia persists after increasing daily protein, consider increasing [name].  Refill [name] 12.5 mg once weekly, as per below.  - Refill tirzepatide ([name]) 12.5 MG/0.5ML Pen; Inject 12.5 mg into the skin once a week.  Dispense: 6 mL; Refill: 0  2. At risk for nausea Rhonda [name] was given approximately 15 minutes of nausea prevention counseling today. Rhonda [name] is at risk for nausea due to her new or current medication. She was encouraged to titrate her medication slowly, make sure to stay  hydrated, eat smaller portions throughout the day, and avoid high fat meals.   3. Obesity: BMI 33.7  Rhonda [name] is currently in the action stage of change. As such, her goal is to continue with weight loss efforts. She has agreed to the Category 2 Plan.   Focus on protein at meals and snack times.  Exercise goals:  NEAT activities.  1 time at the gym per week.  Behavioral modification strategies: increasing lean protein intake, decreasing simple carbohydrates, decreasing eating out, meal planning and cooking strategies, keeping healthy foods in the home, and planning for success.  Rhonda [name] has agreed to follow-up with our clinic in 3 weeks. She was informed of the importance of frequent follow-up visits to maximize her success with intensive lifestyle modifications for her multiple health conditions.   Objective:   Blood pressure 118/78, pulse 68, temperature 98 F (36.7 C), height 5\' 10"  (1.778 m), weight 234 lb (106.1 kg), SpO2 98 %. Body mass index is 33.58 kg/m.  General: Cooperative, alert, well developed, in no acute distress. HEENT: Conjunctivae and lids unremarkable. Cardiovascular: Regular rhythm.  Lungs: Normal work of breathing. Neurologic: No focal deficits.   Lab Results  Component Value Date   CREATININE 1.04 (H) 03/07/2021   BUN 9 03/07/2021   NA 140 03/07/2021   K 4.3 03/07/2021   CL 102 03/07/2021   CO2 19 (L) 03/07/2021   Lab Results  Component Value Date   ALT 38 (H) 03/07/2021   AST 28 03/07/2021   ALKPHOS 68 03/07/2021   BILITOT 0.5 03/07/2021   Lab Results  Component Value Date  HGBA1C 5.0 03/07/2021   HGBA1C 5.0 07/19/2020   HGBA1C 5.2 01/25/2020   HGBA1C 5.2 09/21/2019   HGBA1C 5.1 04/20/2019   Lab Results  Component Value Date   INSULIN 20.8 03/07/2021   INSULIN 34.0 (H) 07/19/2020   INSULIN 9.9 01/25/2020   INSULIN 20.3 09/21/2019   INSULIN 13.8 04/20/2019   Lab Results  Component Value Date   TSH 2.280 03/07/2021   Lab Results   Component Value Date   CHOL 133 03/07/2021   HDL 41 03/07/2021   LDLCALC 76 03/07/2021   TRIG 84 03/07/2021   Lab Results  Component Value Date   VD25OH 70.0 03/07/2021   VD25OH 44.3 07/19/2020   VD25OH 46.7 01/25/2020   Lab Results  Component Value Date   WBC 4.7 01/09/2018   HGB 14.5 01/09/2018   HCT 42.1 01/09/2018   MCV 89 01/09/2018   PLT 195 09/20/2016   Attestation Statements:   Reviewed by clinician on day of visit: allergies, medications, problem list, medical history, surgical history, family history, social history, and previous encounter notes.  I, Water quality scientist, CMA, am acting as Location manager for Mina Marble, NP.  I have reviewed the above documentation for accuracy and completeness, and I agree with the above. -  Alizia Greif d. Koni Kannan, NP-C

## 2021-06-14 ENCOUNTER — Other Ambulatory Visit: Payer: Self-pay

## 2021-06-15 ENCOUNTER — Other Ambulatory Visit: Payer: Self-pay

## 2021-07-03 ENCOUNTER — Encounter (INDEPENDENT_AMBULATORY_CARE_PROVIDER_SITE_OTHER): Payer: Self-pay | Admitting: Adult Health

## 2021-07-03 ENCOUNTER — Other Ambulatory Visit: Payer: Self-pay

## 2021-07-03 ENCOUNTER — Ambulatory Visit (INDEPENDENT_AMBULATORY_CARE_PROVIDER_SITE_OTHER): Payer: No Typology Code available for payment source | Admitting: Adult Health

## 2021-07-03 VITALS — BP 121/80 | HR 78 | Temp 98.1°F | Ht 70.0 in | Wt 229.0 lb

## 2021-07-03 DIAGNOSIS — R197 Diarrhea, unspecified: Secondary | ICD-10-CM

## 2021-07-03 DIAGNOSIS — E8881 Metabolic syndrome: Secondary | ICD-10-CM

## 2021-07-03 DIAGNOSIS — Z6839 Body mass index (BMI) 39.0-39.9, adult: Secondary | ICD-10-CM

## 2021-07-04 DIAGNOSIS — R197 Diarrhea, unspecified: Secondary | ICD-10-CM | POA: Insufficient documentation

## 2021-07-04 NOTE — Progress Notes (Signed)
Chief Complaint:   OBESITY Rhonda [name] is here to discuss her progress with her obesity treatment plan along with follow-up of her obesity related diagnoses. Rhonda [name] is on the Category 2 Plan and states she is following her eating plan approximately 75% of the time. Rhonda [name] states she is not exercising regularly at this time.  Today's visit was #: 47 Starting weight: 273 lbs  Starting date: 01/09/2018 Today's weight: 229 lbs Today's date: 07/03/2021 Total lbs lost to date: 44 lbs Total lbs lost since last in-office visit: 5 lbs  Interim History: Rhonda [name] says that due to polyphagia, Rhonda [name] was replaced with Rhonda [name] on 05/15/2021.   She denies mass in neck, dysphagia, dyspepsia, persistent hoarseness, or GI upset.  She says she is moving from an apartment into a house with her town children- single story home.  Children will have separate rooms. Home has one bathroom.  Of note:  Anticipated graduation from Rhonda [name].  Subjective:   1. Insulin resistance Rhonda [name] says that due to polyphagia, Rhonda [name] was replaced with Rhonda [name] on 05/15/2021.   She denies mass in neck, dysphagia, dyspepsia, persistent hoarseness, or GI upset.  2. Diarrhea, unspecified type Since 06/29/2021 -every morning since early waking for diarrhea - only 1 occurrence.   She denies hematochezia, abdominal pain, family history of colon cancer. She has never had a colonoscopy.  Assessment/Plan:   1. Insulin resistance Continue Rhonda [name].  2. Diarrhea, unspecified type Remain well hydrated. If diarrhea persists >10 days, recommend f/u with PCP.  3. Obesity: BMI 32.9  Rhonda [name] is currently in the action stage of change. As such, her goal is to continue with weight loss efforts. She has agreed to the Category 2 Plan.   Handout:  Thanksgiving Strategies.  Exercise goals:  Moving.  Rhonda [name] -Resistance training 2 times per week.  Behavioral modification strategies: increasing lean  protein intake, decreasing simple carbohydrates, meal planning and cooking strategies, keeping healthy foods in the home, and planning for success.  Rhonda [name] has agreed to follow-up with our clinic in 3-4 weeks. She was informed of the importance of frequent follow-up visits to maximize her success with intensive lifestyle modifications for her multiple health conditions.   Objective:   Blood pressure 121/80, pulse 78, temperature 98.1 F (36.7 C), height 5\' 10"  (1.778 m), weight 229 lb (103.9 kg), SpO2 97 %. Body mass index is 32.86 kg/m.  General: Cooperative, alert, well developed, in no acute distress. HEENT: Conjunctivae and lids unremarkable. Cardiovascular: Regular rhythm.  Lungs: Normal work of breathing. Neurologic: No focal deficits.   Lab Results  Component Value Date   CREATININE 1.04 (H) 03/07/2021   BUN 9 03/07/2021   NA 140 03/07/2021   K 4.3 03/07/2021   CL 102 03/07/2021   CO2 19 (L) 03/07/2021   Lab Results  Component Value Date   ALT 38 (H) 03/07/2021   AST 28 03/07/2021   ALKPHOS 68 03/07/2021   BILITOT 0.5 03/07/2021   Lab Results  Component Value Date   HGBA1C 5.0 03/07/2021   HGBA1C 5.0 07/19/2020   HGBA1C 5.2 01/25/2020   HGBA1C 5.2 09/21/2019   HGBA1C 5.1 04/20/2019   Lab Results  Component Value Date   INSULIN 20.8 03/07/2021   INSULIN 34.0 (H) 07/19/2020   INSULIN 9.9 01/25/2020   INSULIN 20.3 09/21/2019   INSULIN 13.8 04/20/2019   Lab Results  Component Value Date   TSH 2.280 03/07/2021   Lab Results  Component Value  Date   CHOL 133 03/07/2021   HDL 41 03/07/2021   LDLCALC 76 03/07/2021   TRIG 84 03/07/2021   Lab Results  Component Value Date   VD25OH 70.0 03/07/2021   VD25OH 44.3 07/19/2020   VD25OH 46.7 01/25/2020   Lab Results  Component Value Date   WBC 4.7 01/09/2018   HGB 14.5 01/09/2018   HCT 42.1 01/09/2018   MCV 89 01/09/2018   PLT 195 09/20/2016   Attestation Statements:   Reviewed by clinician on day of  visit: allergies, medications, problem list, medical history, surgical history, family history, social history, and previous encounter notes.  Time spent on visit including pre-visit chart review and post-visit care and charting was 28 minutes.   I, Water quality scientist, CMA, am acting as Location manager for Mina Marble, NP.  I have reviewed the above documentation for accuracy and completeness, and I agree with the above. -  Dariya Gainer d. Hervey Wedig, NP-C

## 2021-07-31 ENCOUNTER — Other Ambulatory Visit: Payer: Self-pay

## 2021-07-31 ENCOUNTER — Encounter (INDEPENDENT_AMBULATORY_CARE_PROVIDER_SITE_OTHER): Payer: Self-pay | Admitting: Adult Health

## 2021-07-31 ENCOUNTER — Ambulatory Visit (INDEPENDENT_AMBULATORY_CARE_PROVIDER_SITE_OTHER): Payer: No Typology Code available for payment source | Admitting: Adult Health

## 2021-07-31 VITALS — BP 108/70 | HR 82 | Temp 97.7°F | Ht 70.0 in | Wt 226.0 lb

## 2021-07-31 DIAGNOSIS — R7401 Elevation of levels of liver transaminase levels: Secondary | ICD-10-CM

## 2021-07-31 DIAGNOSIS — Z6839 Body mass index (BMI) 39.0-39.9, adult: Secondary | ICD-10-CM

## 2021-07-31 DIAGNOSIS — E559 Vitamin D deficiency, unspecified: Secondary | ICD-10-CM | POA: Diagnosis not present

## 2021-07-31 DIAGNOSIS — E8881 Metabolic syndrome: Secondary | ICD-10-CM | POA: Diagnosis not present

## 2021-07-31 DIAGNOSIS — Z9189 Other specified personal risk factors, not elsewhere classified: Secondary | ICD-10-CM | POA: Diagnosis not present

## 2021-07-31 MED ORDER — TIRZEPATIDE 12.5 MG/0.5ML ~~LOC~~ SOAJ
12.5000 mg | SUBCUTANEOUS | 0 refills | Status: DC
  Filled 2021-07-31: qty 12, 168d supply, fill #0
  Filled 2021-08-02: qty 4, 28d supply, fill #0
  Filled 2021-08-22: qty 2, 28d supply, fill #0

## 2021-08-01 NOTE — Progress Notes (Signed)
Chief Complaint:   OBESITY Rhonda [name] is here to discuss her progress with her obesity treatment plan along with follow-up of her obesity related diagnoses. [name] is on the Category 2 Plan and states she is following her eating plan approximately 80% of the time. [name] states she is not exercising regularly at this time.  Today's visit was #: 33 Starting weight: 273 lbs Starting date: 01/09/2018 Today's weight: 226 lbs Today's date: 07/31/2021 Total lbs lost to date: 47 lbs Total lbs lost since last in-office visit: 3 lbs  Interim History:  Rhonda [name] is fully moved into her new home - not fully unpacked. Rhonda [name] replaced Wegovy on 05/15/2021 - currently on 12.5 mg dose of GIP/GLP-1.   Subjective:   1. Insulin resistance Rhonda [name] replaced Wegovy on 05/15/2021 - currently on 12.5 mg dose of GIP/GLP-1. She denies mass in neck, dysphagia, dyspepsia, persistent hoarseness or GI upset. She reports stable appetite. She would like to remain on 12.5mg  dose at this time. On 03/07/2021, insulin level - 20.8.  2. Transaminitis ALT elevated since 2019. She denies RUQ abdominal pain. She denies excessive ETOH or Acetaminophen use.  3. Vitamin D deficiency She converted to OTC vitamin D3 5,000 IU daily from Ergocalciferol after last Vit D level of 70.0 on 03/07/21.  4. At risk for constipation [name] is at increased risk for constipation due to taking Rhonda [name] to IR.  Assessment/Plan:   1. Insulin resistance Check labs today. Refill Rhonda [name] 12.5 once weekly.  - Refill tirzepatide (Rhonda [name]) 12.5 MG/0.5ML Pen; Inject 12.5 mg into the skin once a week.  Dispense: 12 mL; Refill: 0 - Hemoglobin A1c - Insulin, random  2. Transaminitis Check labs today.  - Comprehensive metabolic panel  3. Vitamin D deficiency Check vitamin D level today.  - VITAMIN D 25 Hydroxy (Vit-D Deficiency, Fractures)  4. At risk for constipation Rhonda [name] was given approximately 15 minutes of  counseling today regarding prevention of constipation. She was encouraged to increase water and fiber intake.   5. Obesity: BMI 32.5  [name] is currently in the action stage of change. As such, her goal is to continue with weight loss efforts. She has agreed to the Category 2 Plan.   Exercise goals:  Increase daily walking.  Behavioral modification strategies: increasing lean protein intake, decreasing simple carbohydrates, meal planning and cooking strategies, keeping healthy foods in the home, and planning for success.  Rhonda [name] has agreed to follow-up with our clinic in 3-4 weeks. She was informed of the importance of frequent follow-up visits to maximize her success with intensive lifestyle modifications for her multiple health conditions.   Objective:   Blood pressure 108/70, pulse 82, temperature 97.7 F (36.5 C), height 5\' 10"  (1.778 m), weight 226 lb (102.5 kg), SpO2 99 %. Body mass index is 32.43 kg/m.  General: Cooperative, alert, well developed, in no acute distress. HEENT: Conjunctivae and lids unremarkable. Cardiovascular: Regular rhythm.  Lungs: Normal work of breathing. Neurologic: No focal deficits.   Lab Results  Component Value Date   CREATININE 1.04 (H) 03/07/2021   BUN 9 03/07/2021   NA 140 03/07/2021   K 4.3 03/07/2021   CL 102 03/07/2021   CO2 19 (L) 03/07/2021   Lab Results  Component Value Date   ALT 38 (H) 03/07/2021   AST 28 03/07/2021   ALKPHOS 68 03/07/2021   BILITOT 0.5 03/07/2021   Lab Results  Component Value Date   HGBA1C 5.0 03/07/2021   HGBA1C 5.0 07/19/2020   HGBA1C 5.2  01/25/2020   HGBA1C 5.2 09/21/2019   HGBA1C 5.1 04/20/2019   Lab Results  Component Value Date   INSULIN 20.8 03/07/2021   INSULIN 34.0 (H) 07/19/2020   INSULIN 9.9 01/25/2020   INSULIN 20.3 09/21/2019   INSULIN 13.8 04/20/2019   Lab Results  Component Value Date   TSH 2.280 03/07/2021   Lab Results  Component Value Date   CHOL 133 03/07/2021   HDL 41  03/07/2021   LDLCALC 76 03/07/2021   TRIG 84 03/07/2021   Lab Results  Component Value Date   VD25OH 70.0 03/07/2021   VD25OH 44.3 07/19/2020   VD25OH 46.7 01/25/2020   Lab Results  Component Value Date   WBC 4.7 01/09/2018   HGB 14.5 01/09/2018   HCT 42.1 01/09/2018   MCV 89 01/09/2018   PLT 195 09/20/2016   Attestation Statements:   Reviewed by clinician on day of visit: allergies, medications, problem list, medical history, surgical history, family history, social history, and previous encounter notes.  I, Water quality scientist, CMA, am acting as Location manager for Mina Marble, NP.  I have reviewed the above documentation for accuracy and completeness, and I agree with the above. -  Devontaye Ground d. Aravind Chrismer, NP-C

## 2021-08-02 ENCOUNTER — Other Ambulatory Visit: Payer: Self-pay

## 2021-08-10 ENCOUNTER — Other Ambulatory Visit: Payer: Self-pay

## 2021-08-21 ENCOUNTER — Other Ambulatory Visit: Payer: Self-pay

## 2021-08-22 ENCOUNTER — Other Ambulatory Visit: Payer: Self-pay

## 2021-08-24 LAB — COMPREHENSIVE METABOLIC PANEL
ALT: 31 IU/L (ref 0–32)
AST: 28 IU/L (ref 0–40)
Albumin/Globulin Ratio: 2 (ref 1.2–2.2)
Albumin: 5.5 g/dL — ABNORMAL HIGH (ref 3.8–4.8)
Alkaline Phosphatase: 77 IU/L (ref 44–121)
BUN/Creatinine Ratio: 12 (ref 9–23)
BUN: 12 mg/dL (ref 6–20)
Bilirubin Total: 0.6 mg/dL (ref 0.0–1.2)
CO2: 21 mmol/L (ref 20–29)
Calcium: 9.7 mg/dL (ref 8.7–10.2)
Chloride: 103 mmol/L (ref 96–106)
Creatinine, Ser: 1.04 mg/dL — ABNORMAL HIGH (ref 0.57–1.00)
Globulin, Total: 2.8 g/dL (ref 1.5–4.5)
Glucose: 70 mg/dL (ref 70–99)
Potassium: 4.5 mmol/L (ref 3.5–5.2)
Sodium: 141 mmol/L (ref 134–144)
Total Protein: 8.3 g/dL (ref 6.0–8.5)
eGFR: 72 mL/min/{1.73_m2} (ref >59)

## 2021-08-24 LAB — HEMOGLOBIN A1C
Est. average glucose Bld gHb Est-mCnc: 97 mg/dL
Hgb A1c MFr Bld: 5 % (ref 4.8–5.6)

## 2021-08-24 LAB — INSULIN, RANDOM: INSULIN: 7 u[IU]/mL (ref 2.6–24.9)

## 2021-08-28 ENCOUNTER — Other Ambulatory Visit: Payer: Self-pay

## 2021-08-28 ENCOUNTER — Encounter (INDEPENDENT_AMBULATORY_CARE_PROVIDER_SITE_OTHER): Payer: Self-pay | Admitting: Adult Health

## 2021-08-28 ENCOUNTER — Ambulatory Visit (INDEPENDENT_AMBULATORY_CARE_PROVIDER_SITE_OTHER): Payer: No Typology Code available for payment source | Admitting: Adult Health

## 2021-08-28 VITALS — BP 117/67 | HR 79 | Temp 98.0°F | Ht 70.0 in | Wt 225.0 lb

## 2021-08-28 DIAGNOSIS — E8881 Metabolic syndrome: Secondary | ICD-10-CM | POA: Diagnosis not present

## 2021-08-28 DIAGNOSIS — R7401 Elevation of levels of liver transaminase levels: Secondary | ICD-10-CM | POA: Diagnosis not present

## 2021-08-28 DIAGNOSIS — Z6832 Body mass index (BMI) 32.0-32.9, adult: Secondary | ICD-10-CM

## 2021-08-28 DIAGNOSIS — Z6839 Body mass index (BMI) 39.0-39.9, adult: Secondary | ICD-10-CM

## 2021-08-29 ENCOUNTER — Other Ambulatory Visit: Payer: Self-pay

## 2021-08-29 ENCOUNTER — Ambulatory Visit: Payer: No Typology Code available for payment source | Admitting: Psychiatry

## 2021-08-29 DIAGNOSIS — R7401 Elevation of levels of liver transaminase levels: Secondary | ICD-10-CM | POA: Insufficient documentation

## 2021-08-29 NOTE — Progress Notes (Signed)
Chief Complaint:   OBESITY Rhonda [name] is here to discuss her progress with her obesity treatment plan along with follow-up of her obesity related diagnoses. Rhonda [name] is on the Category 2 Plan and states she is following her eating plan approximately 60% of the time. Rhonda [name] states she is not exercising regularly at this time.  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  [name] was replaced with [name] 12.5 mg on 05/15/2021. Injects [name] 12.5 mg on Monday evenings - experienced profound polyphagia yesterday.   Subjective:   1. Insulin resistance Wegovy was replaced with [name] 12.5 mg on 05/15/2021. She injects [name] on Monday evening- denies mass in neck, dysphagia, dyspepsia, persistent hoarseness, or GI upset. She experienced profound polyphagia yesterday. Discussed labs with patient today.  On 08/23/2021, BG 70, A1c 5.0, insulin 7.0. Great improvement of insulin!  Highest - 34.0 on 03/07/2021.   2. Transaminitis Discussed labs with patient today.  On 08/23/2021 - CMP = LFTs WNL. She denies RUQ pain.  Assessment/Plan:   1. Insulin resistance Continue [name] 12.5 mg once weekly.  2. Transaminitis Continue with weight loss efforts. Monitor labs.  3. Obesity: BMI 32.4  Rhonda [name] is currently in the action stage of change. As such, her goal is to continue with weight loss efforts. She has agreed to the Category 2 Plan.   Reduce soda by 25%.  Increase plan compliance to >80%, especially protein at meals to help reduce polyphagia.   Exercise goals:  Go to the gym once a week.  Behavioral modification strategies: increasing lean protein intake, decreasing simple carbohydrates, decreasing liquid calories, meal planning  and cooking strategies, keeping healthy foods in the home, and planning for success.  Rhonda [name] has agreed to follow-up with our clinic in 3 weeks. She was informed of the importance of frequent follow-up visits to maximize her success with intensive lifestyle modifications for her multiple health conditions.   Objective:   Blood pressure 117/67, pulse 79, temperature 98 F (36.7 C), height 5\' 10"  (1.778 m), weight 225 lb (102.1 kg), SpO2 99 %. Body mass index is 32.28 kg/m.  General: Cooperative, alert, well developed, in no acute distress. HEENT: Conjunctivae and lids unremarkable. Cardiovascular: Regular rhythm.  Lungs: Normal work of breathing. Neurologic: No focal deficits.   Lab Results  Component Value Date   CREATININE 1.04 (H) 08/23/2021   BUN 12 08/23/2021   NA 141 08/23/2021   K 4.5 08/23/2021   CL 103 08/23/2021   CO2 21 08/23/2021   Lab Results  Component Value Date   ALT 31 08/23/2021   AST 28 08/23/2021   ALKPHOS 77 08/23/2021   BILITOT 0.6 08/23/2021   Lab Results  Component Value Date   HGBA1C 5.0 08/23/2021   HGBA1C 5.0 03/07/2021   HGBA1C 5.0 07/19/2020   HGBA1C 5.2 01/25/2020   HGBA1C 5.2 09/21/2019   Lab Results  Component Value Date   INSULIN 7.0 08/23/2021   INSULIN 20.8 03/07/2021   INSULIN 34.0 (H) 07/19/2020   INSULIN 9.9 01/25/2020   INSULIN 20.3 09/21/2019   Lab Results  Component Value Date   TSH 2.280 03/07/2021   Lab Results  Component Value Date   CHOL 133 03/07/2021   HDL 41 03/07/2021  LDLCALC 76 03/07/2021   TRIG 84 03/07/2021   Lab Results  Component Value Date   VD25OH 70.0 03/07/2021   VD25OH 44.3 07/19/2020   VD25OH 46.7 01/25/2020   Lab Results  Component Value Date   WBC 4.7 01/09/2018   HGB 14.5 01/09/2018   HCT 42.1 01/09/2018   MCV 89 01/09/2018   PLT 195 09/20/2016   Attestation Statements:   Reviewed by clinician on day of visit: allergies, medications, problem list, medical history, surgical  history, family history, social history, and previous encounter notes.  Time spent on visit including pre-visit chart review and post-visit care and charting was 27 minutes.   I, Water quality scientist, CMA, am acting as Location manager for Mina Marble, NP.  I have reviewed the above documentation for accuracy and completeness, and I agree with the above. -  Miyoshi Ligas d. Shaunika Italiano, NP-C

## 2021-09-14 ENCOUNTER — Other Ambulatory Visit: Payer: Self-pay

## 2021-09-14 ENCOUNTER — Ambulatory Visit (INDEPENDENT_AMBULATORY_CARE_PROVIDER_SITE_OTHER): Payer: No Typology Code available for payment source | Admitting: Psychiatry

## 2021-09-14 ENCOUNTER — Encounter: Payer: Self-pay | Admitting: Psychiatry

## 2021-09-14 VITALS — BP 115/78 | HR 84 | Temp 98.3°F | Ht 69.25 in | Wt 226.0 lb

## 2021-09-14 DIAGNOSIS — F3342 Major depressive disorder, recurrent, in full remission: Secondary | ICD-10-CM

## 2021-09-14 DIAGNOSIS — F5105 Insomnia due to other mental disorder: Secondary | ICD-10-CM | POA: Diagnosis not present

## 2021-09-14 DIAGNOSIS — F411 Generalized anxiety disorder: Secondary | ICD-10-CM

## 2021-09-14 MED ORDER — BUSPIRONE HCL 15 MG PO TABS
15.0000 mg | ORAL_TABLET | Freq: Two times a day (BID) | ORAL | 1 refills | Status: DC
  Filled 2021-09-14: qty 180, 90d supply, fill #0
  Filled 2021-12-25: qty 180, 90d supply, fill #1

## 2021-09-14 MED ORDER — BUPROPION HCL ER (XL) 300 MG PO TB24
ORAL_TABLET | Freq: Every day | ORAL | 1 refills | Status: DC
  Filled 2021-09-14 – 2021-12-25 (×2): qty 90, 90d supply, fill #0

## 2021-09-14 NOTE — Progress Notes (Signed)
[name] MD OP Progress Note  09/14/2021 4:58 PM [name]  MRN:  [id number]  Chief Complaint:  Chief Complaint   Follow-up   36 year old Caucasian female with history of depression, anxiety, separation from husband, employed, currently in school, presents for medication management.   HPI: Rhonda [name] is a 36 year old Caucasian female, separated, employed, has a history of depression, GAD, insomnia was evaluated in office today.  Patient today reports she is currently doing fairly well with regards to her mood.  She reports work is going well.  She continues to be in school and hopes to graduate in May 2023.  She looks forward to that.  She reports she is currently coparenting with her husband with whom she is separated.  That is going well so far.  She has started dating and reports she is happy with the same.  Patient reports sleep is good.  Denies any appetite changes.  Reports she is currently watching her diet and continues to want to lose weight.  Patient denies any suicidality, homicidality or perceptual disturbances.  Patient denies any other concerns today.  Visit Diagnosis:    ICD-10-CM   1. MDD (major depressive disorder), recurrent, in full remission ([place])  F33.42 busPIRone (BUSPAR) 15 MG tablet    2. GAD (generalized anxiety disorder)  F41.1 busPIRone (BUSPAR) 15 MG tablet    3. Insomnia due to mental condition  F51.05 buPROPion (WELLBUTRIN XL) 300 MG 24 hr tablet   mood      Past Psychiatric History: Reviewed past psychiatric history from progress note on 11/05/2018.  Past Medical History:  Past Medical History:  Diagnosis Date   Depression    treated   PCOS (polycystic ovarian syndrome) 2008   Plantar fasciitis     Past Surgical History:  Procedure Laterality Date   CHOLECYSTECTOMY      Family Psychiatric History: Reviewed family psychiatric history from progress note on 11/05/2018.  Family History:  Family History  Problem Relation Age of  Onset   High blood pressure Mother    Depression Father    Liver disease Father    Alcoholism Father    Alcohol abuse Father    Diabetes Paternal Grandmother    Bipolar disorder Maternal Aunt     Social History: Reviewed social history from progress note on 11/05/2018. Social History   Socioeconomic History   Marital status: Legally Separated    Spouse name: [name]   Number of children: 2   Years of education: Not on file   Highest education level: Bachelor's degree (e.g., BA, AB, BS)  Occupational History   Occupation: RN  Tobacco Use   Smoking status: Never   Smokeless tobacco: Never  Vaping Use   Vaping Use: Never used  Substance and Sexual Activity   Alcohol use: Yes    Alcohol/week: 1.0 standard drink    Types: 1 Glasses of wine per week    Comment: Occasional once a week.   Drug use: No   Sexual activity: Yes    Partners: Male    Birth control/protection: None, I.U.D.  Other Topics Concern   Not on file  Social History Narrative   Not on file   Social Determinants of Health   Financial Resource Strain: Not on file  Food Insecurity: Not on file  Transportation Needs: Not on file  Physical Activity: Not on file  Stress: Not on file  Social Connections: Not on file    Allergies: No Known Allergies  Metabolic Disorder Labs: Lab  Results  Component Value Date   HGBA1C 5.0 08/23/2021   No results found for: PROLACTIN Lab Results  Component Value Date   CHOL 133 03/07/2021   TRIG 84 03/07/2021   HDL 41 03/07/2021   LDLCALC 76 03/07/2021   LDLCALC 97 01/09/2018   Lab Results  Component Value Date   TSH 2.280 03/07/2021   TSH 1.470 01/09/2018    Therapeutic Level Labs: No results found for: LITHIUM No results found for: VALPROATE No components found for:  CBMZ  Current Medications: Current Outpatient Medications  Medication Sig Dispense Refill   Cholecalciferol (VITAMIN D) 125 MCG (5000 UT) CAPS Take 1 capsule by mouth daily. 30 capsule 0    cyclobenzaprine (FLEXERIL) 5 MG tablet Take 1 tablet (5 mg total) by mouth nightly as needed 30 tablet 1   ELDERBERRY PO Take by mouth.     levocetirizine (XYZAL) 5 MG tablet Take 5 mg by mouth every evening.     levonorgestrel (MIRENA) 20 MCG/24HR IUD 1 each by Intrauterine route once.     Multiple Vitamin (MULTIVITAMIN) tablet Take 1 tablet by mouth daily.     Probiotic Product (PROBIOTIC-10 PO) Take by mouth.     spironolactone (ALDACTONE) 100 MG tablet TAKE 1 TABLET BY MOUTH ONCE DAILY 90 tablet 3   tirzepatide ([name]) 12.5 MG/0.5ML Pen Inject 12.5 mg into the skin once a week. 12 mL 0   buPROPion (WELLBUTRIN XL) 300 MG 24 hr tablet TAKE 1 TABLET (300 MG TOTAL) BY MOUTH DAILY. 90 tablet 1   busPIRone (BUSPAR) 15 MG tablet Take 1 tablet (15 mg total) by mouth 2 (two) times daily. 180 tablet 1   doxepin (SINEQUAN) 10 MG capsule TAKE 1 TO 2 CAPSULES BY MOUTH AT BEDTIME AS NEEDED FOR SLEEP 30 capsule 1   No current facility-administered medications for this visit.     Musculoskeletal: Strength & Muscle Tone: within normal limits Gait & Station: normal Patient leans: N/A  Psychiatric Specialty Exam: Review of Systems  Psychiatric/Behavioral:  Negative for agitation, behavioral problems, confusion, decreased concentration, dysphoric mood, hallucinations, self-injury, sleep disturbance and suicidal ideas. The patient is not nervous/anxious and is not hyperactive.   All other systems reviewed and are negative.  Blood pressure 115/78, pulse 84, temperature 98.3 F (36.8 C), height 5' 9.25" (1.759 m), weight 226 lb (102.5 kg), SpO2 98 %.Body mass index is 33.13 kg/m.  General Appearance: Casual  Eye Contact:  Fair  Speech:  Clear and Coherent  Volume:  Normal  Mood:  Euthymic  Affect:  Congruent  Thought Process:  Goal Directed and Descriptions of Associations: Intact  Orientation:  Full (Time, Place, and Person)  Thought Content: Logical   Suicidal Thoughts:  No  Homicidal  Thoughts:  No  Memory:  Immediate;   Fair Recent;   Fair Remote;   Fair  Judgement:  Fair  Insight:  Fair  Psychomotor Activity:  Normal  Concentration:  Concentration: Fair and Attention Span: Fair  Recall:  [employer] of Knowledge: Fair  Language: Fair  Akathisia:  No  Handed:  Right  AIMS (if indicated): done,0  Assets:  Communication Skills Desire for Improvement Housing Intimacy Social Support Talents/Skills  ADL's:  Intact  Cognition: WNL  Sleep:  Fair   Screenings: GAD-7    Flowsheet Row Video Visit from 12/07/2020 in [facility]  Total GAD-7 Score 1      PHQ2-9    [facility] Visit from 09/14/2021 in [facility] Video Visit from 03/07/2021 in [facility] Video Visit from 12/07/2020 in [facility] Video Visit from 10/19/2020 in [facility] Office Visit from 01/09/2018 in [facility]  PHQ-2 Total Score 0 0 1 1 3   PHQ-9 Total Score -- -- -- -- 13      Flowsheet Row Video Visit from 03/07/2021 in [facility] Video Visit from 10/19/2020 in [facility] No Risk Low Risk        Assessment and Plan: DAMIA BOBROWSKI is a 35 year old Caucasian female, separated, employed, has a history of MDD, GAD was evaluated in office today.  Patient is currently stable.  Plan as noted below.  Plan MDD in remission Wellbutrin XL 300 mg p.o. daily L-methylfolate as prescribed  GAD-stable BuSpar 15 mg p.o. twice daily Continue CBT with Ms. Freddi Che.  Insomnia-stable Doxepin 10 to 20 mg p.o. nightly as needed she currently does not take it although it is available. Continued sleep hygiene techniques  Genesight testing results were reviewed while making medication changes.  Follow-up in clinic in 3 to 4 months or sooner if  needed.  This note was generated in part or whole with voice recognition software. Voice recognition is usually quite accurate but there are transcription errors that can and very often do occur. I apologize for any typographical errors that were not detected and corrected.       Ursula Alert, MD 09/15/2021, 11:12 AM

## 2021-09-18 ENCOUNTER — Ambulatory Visit (INDEPENDENT_AMBULATORY_CARE_PROVIDER_SITE_OTHER): Payer: No Typology Code available for payment source | Admitting: Adult Health

## 2021-09-20 ENCOUNTER — Other Ambulatory Visit: Payer: Self-pay

## 2021-09-20 ENCOUNTER — Ambulatory Visit (INDEPENDENT_AMBULATORY_CARE_PROVIDER_SITE_OTHER): Payer: No Typology Code available for payment source | Admitting: Adult Health

## 2021-09-20 ENCOUNTER — Encounter (INDEPENDENT_AMBULATORY_CARE_PROVIDER_SITE_OTHER): Payer: Self-pay | Admitting: Adult Health

## 2021-09-20 VITALS — BP 124/80 | HR 75 | Temp 97.9°F | Ht 70.0 in | Wt 229.0 lb

## 2021-09-20 DIAGNOSIS — Z9189 Other specified personal risk factors, not elsewhere classified: Secondary | ICD-10-CM

## 2021-09-20 DIAGNOSIS — E669 Obesity, unspecified: Secondary | ICD-10-CM

## 2021-09-20 DIAGNOSIS — Z6833 Body mass index (BMI) 33.0-33.9, adult: Secondary | ICD-10-CM

## 2021-09-20 DIAGNOSIS — E8881 Metabolic syndrome: Secondary | ICD-10-CM

## 2021-09-20 MED ORDER — TIRZEPATIDE 12.5 MG/0.5ML ~~LOC~~ SOAJ
12.5000 mg | SUBCUTANEOUS | 0 refills | Status: DC
  Filled 2021-09-20: qty 2, 28d supply, fill #0

## 2021-09-21 ENCOUNTER — Other Ambulatory Visit: Payer: Self-pay

## 2021-09-21 NOTE — Progress Notes (Signed)
Chief Complaint:   OBESITY Rhonda [name] is here to discuss her progress with her obesity treatment plan along with follow-up of her obesity related diagnoses. Rhonda [name] is on the Category 2 Plan and states she is following her eating plan approximately 50% of the time. Rhonda [name] states she is not exercising at this time.  Today's visit was #: 75 Starting weight: 273 lbs Starting date: 01/09/2018 Today's weight: 229 lbs Today's date: 09/21/2020 Total lbs lost to date: 83 ;bs Total lbs lost since last in-office visit: 0  Interim History:  Rhonda [name] says that over the past few weeks: she celebrated her birthday and has been eating off plan more frequently.  She feels bloated due to having her menses.  She enjoyed spin class at her local gym and has been considering re-starting regularly weekly classes- [employer]!  Subjective:   1. Insulin resistance Wegovy was replaced with [name] 12.5 mg on 05/15/2021. She injects [name] on Monday evening- denies mass in neck, dysphagia, dyspepsia, persistent hoarseness, or GI upset. Polyphagia has decreased and she would like to remain on 12.5mg  strength.  2. At risk for constipation Rhonda [name] is at increased risk for constipation due to [facility] for IR.  Assessment/Plan:   1. Insulin resistance Refill [name] 12.5 mg once weekly.  - Refill tirzepatide ([name]) 12.5 MG/0.5ML Pen; Inject 12.5 mg into the skin once a week.  Dispense: 12 mL; Refill: 0  2. At risk for constipation Rhonda [name] was given approximately 15 minutes of counseling today regarding prevention of constipation. She was encouraged to increase water and fiber intake.   3. Obesity: BMI 33.0  Rhonda [name] is currently in the action stage of change. As such, her goal is to continue with weight loss efforts. She has agreed to the Category 2 Plan.   Exercise goals:  1 spin class per week.  Behavioral modification strategies: increasing lean protein intake, decreasing simple  carbohydrates, increasing water intake, no skipping meals, meal planning and cooking strategies, and planning for success.  Rhonda [name] has agreed to follow-up with our clinic in 3 weeks. She was informed of the importance of frequent follow-up visits to maximize her success with intensive lifestyle modifications for her multiple health conditions.   Objective:   Blood pressure 124/80, pulse 75, temperature 97.9 F (36.6 C), height 5\' 10"  (1.778 m), weight 229 lb (103.9 kg), SpO2 100 %. Body mass index is 32.86 kg/m.  General: Cooperative, alert, well developed, in no acute distress. HEENT: Conjunctivae and lids unremarkable. Cardiovascular: Regular rhythm.  Lungs: Normal work of breathing. Neurologic: No focal deficits.   Lab Results  Component Value Date   CREATININE 1.04 (H) 08/23/2021   BUN 12 08/23/2021   NA 141 08/23/2021   K 4.5 08/23/2021   CL 103 08/23/2021   CO2 21 08/23/2021   Lab Results  Component Value Date   ALT 31 08/23/2021   AST 28 08/23/2021   ALKPHOS 77 08/23/2021   BILITOT 0.6 08/23/2021   Lab Results  Component Value Date   HGBA1C 5.0 08/23/2021   HGBA1C 5.0 03/07/2021   HGBA1C 5.0 07/19/2020   HGBA1C 5.2 01/25/2020   HGBA1C 5.2 09/21/2019   Lab Results  Component Value Date   INSULIN 7.0 08/23/2021   INSULIN 20.8 03/07/2021   INSULIN 34.0 (H) 07/19/2020   INSULIN 9.9 01/25/2020   INSULIN 20.3 09/21/2019   Lab Results  Component Value Date   TSH 2.280 03/07/2021   Lab Results  Component Value Date   CHOL 133 03/07/2021  HDL 41 03/07/2021   LDLCALC 76 03/07/2021   TRIG 84 03/07/2021   Lab Results  Component Value Date   VD25OH 70.0 03/07/2021   VD25OH 44.3 07/19/2020   VD25OH 46.7 01/25/2020   Lab Results  Component Value Date   WBC 4.7 01/09/2018   HGB 14.5 01/09/2018   HCT 42.1 01/09/2018   MCV 89 01/09/2018   PLT 195 09/20/2016   Attestation Statements:   Reviewed by clinician on day of visit: allergies, medications,  problem list, medical history, surgical history, family history, social history, and previous encounter notes.  I, Water quality scientist, CMA, am acting as Location manager for Mina Marble, NP.  I have reviewed the above documentation for accuracy and completeness, and I agree with the above. -  Patryce Depriest d. Islam Eichinger, NP-C

## 2021-09-26 ENCOUNTER — Other Ambulatory Visit: Payer: Self-pay

## 2021-10-09 ENCOUNTER — Encounter (INDEPENDENT_AMBULATORY_CARE_PROVIDER_SITE_OTHER): Payer: Self-pay

## 2021-10-10 ENCOUNTER — Other Ambulatory Visit: Payer: Self-pay

## 2021-10-10 ENCOUNTER — Ambulatory Visit (INDEPENDENT_AMBULATORY_CARE_PROVIDER_SITE_OTHER): Payer: No Typology Code available for payment source | Admitting: Adult Health

## 2021-10-10 ENCOUNTER — Encounter (INDEPENDENT_AMBULATORY_CARE_PROVIDER_SITE_OTHER): Payer: Self-pay | Admitting: Adult Health

## 2021-10-10 VITALS — BP 125/80 | HR 72 | Temp 97.5°F | Ht 70.0 in | Wt 226.0 lb

## 2021-10-10 DIAGNOSIS — E669 Obesity, unspecified: Secondary | ICD-10-CM | POA: Diagnosis not present

## 2021-10-10 DIAGNOSIS — Z6832 Body mass index (BMI) 32.0-32.9, adult: Secondary | ICD-10-CM | POA: Diagnosis not present

## 2021-10-10 DIAGNOSIS — E8881 Metabolic syndrome: Secondary | ICD-10-CM | POA: Diagnosis not present

## 2021-10-10 DIAGNOSIS — Z9189 Other specified personal risk factors, not elsewhere classified: Secondary | ICD-10-CM

## 2021-10-10 MED ORDER — TIRZEPATIDE 15 MG/0.5ML ~~LOC~~ SOAJ
15.0000 mg | SUBCUTANEOUS | 0 refills | Status: DC
  Filled 2021-10-10: qty 6, 84d supply, fill #0

## 2021-10-11 NOTE — Progress Notes (Signed)
Chief Complaint:   OBESITY Rhonda [name] is here to discuss her progress with her obesity treatment plan along with follow-up of her obesity related diagnoses. Rhonda [name] is on the Category 2 Plan and states she is following her eating plan approximately 50% of the time. Rhonda [name] states she is not exercising regularly at this time.  Today's visit was #: 96 Starting weight: 273 lbs Starting date: 01/09/2018 Today's weight: 226 lbs Today's date: 10/10/2021 Total lbs lost to date: 47 lbs Total lbs lost since last in-office visit: 3 lbs  Interim History:  Since the last OV she attended 1 spin class - enjoyed it. She reports increased in polyphagia - on [name] 12.5 mg.  Of note - She has an IUD, boyfriend has had a vasectomy.   Subjective:   1. Insulin resistance Wegovy was replaced with [name] 12.5 mg on 05/15/2021. Injects [name] 12.5 mg on Monday evenings -  [name] 12.5 mg- denies mass in neck, dysphagia, dyspepsia, persistent hoarseness, abd pain, or GI upset.  She reports increased in polyphagia  2. At risk for nausea Rhonda [name] is at risk for nausea due to increasing [name].  Assessment/Plan:   1. Insulin resistance Increase and refill [name] to 15 mg once weekly.  - Increase and refill tirzepatide ([name]) 15 MG/0.5ML Pen; Inject 15 mg into the skin once a week.  Dispense: 6 mL; Refill: 0  2. At risk for nausea Rhonda [name] was given approximately 15 minutes of nausea prevention counseling today. Rhonda [name] is at risk for nausea due to her new or current medication. She was encouraged to titrate her medication slowly, make sure to stay hydrated, eat smaller portions throughout the day, and avoid high fat meals.    3. Obesity: BMI 32.5  Rhonda [name] is currently in the action stage of change. As such, her goal is to continue with weight loss efforts. She has agreed to the Category 2 Plan and keeping a food journal and adhering to recommended goals of 400-500 calories and  35 grams of protein at supper.   Increase compliance of Cat 2 meal plan to at least 80%.  Exercise goals:  1 spin class bi-weekly.  Behavioral modification strategies: increasing lean protein intake, decreasing simple carbohydrates, meal planning and cooking strategies, keeping healthy foods in the home, and planning for success.  Rhonda [name] has agreed to follow-up with our clinic in 3-4 weeks. She was informed of the importance of frequent follow-up visits to maximize her success with intensive lifestyle modifications for her multiple health conditions.   Objective:   Blood pressure 125/80, pulse 72, temperature (!) 97.5 F (36.4 C), height 5\' 10"  (1.778 m), weight 226 lb (102.5 kg), SpO2 99 %. Body mass index is 32.43 kg/m.  General: Cooperative, alert, well developed, in no acute distress. HEENT: Conjunctivae and lids unremarkable. Cardiovascular: Regular rhythm.  Lungs: Normal work of breathing. Neurologic: No focal deficits.   Lab Results  Component Value Date   CREATININE 1.04 (H) 08/23/2021   BUN 12 08/23/2021   NA 141 08/23/2021   K 4.5 08/23/2021   CL 103 08/23/2021   CO2 21 08/23/2021   Lab Results  Component Value Date   ALT 31 08/23/2021   AST 28 08/23/2021   ALKPHOS 77 08/23/2021   BILITOT 0.6 08/23/2021   Lab Results  Component Value Date   HGBA1C 5.0 08/23/2021   HGBA1C 5.0 03/07/2021   HGBA1C 5.0 07/19/2020   HGBA1C 5.2 01/25/2020   HGBA1C 5.2 09/21/2019   Lab Results  Component Value Date   INSULIN 7.0 08/23/2021   INSULIN 20.8 03/07/2021   INSULIN 34.0 (H) 07/19/2020   INSULIN 9.9 01/25/2020   INSULIN 20.3 09/21/2019   Lab Results  Component Value Date   TSH 2.280 03/07/2021   Lab Results  Component Value Date   CHOL 133 03/07/2021   HDL 41 03/07/2021   LDLCALC 76 03/07/2021   TRIG 84 03/07/2021   Lab Results  Component Value Date   VD25OH 70.0 03/07/2021   VD25OH 44.3 07/19/2020   VD25OH 46.7 01/25/2020   Lab Results  Component  Value Date   WBC 4.7 01/09/2018   HGB 14.5 01/09/2018   HCT 42.1 01/09/2018   MCV 89 01/09/2018   PLT 195 09/20/2016   Attestation Statements:   Reviewed by clinician on day of visit: allergies, medications, problem list, medical history, surgical history, family history, social history, and previous encounter notes.  I, Water quality scientist, CMA, am acting as Location manager for Mina Marble, NP.  I have reviewed the above documentation for accuracy and completeness, and I agree with the above. -  Cotton Beckley d. Jannine Abreu, NP-C

## 2021-10-12 ENCOUNTER — Other Ambulatory Visit: Payer: Self-pay

## 2021-10-27 ENCOUNTER — Other Ambulatory Visit: Payer: Self-pay

## 2021-11-06 ENCOUNTER — Ambulatory Visit (INDEPENDENT_AMBULATORY_CARE_PROVIDER_SITE_OTHER): Payer: No Typology Code available for payment source | Admitting: Adult Health

## 2021-11-07 ENCOUNTER — Ambulatory Visit (INDEPENDENT_AMBULATORY_CARE_PROVIDER_SITE_OTHER): Payer: No Typology Code available for payment source | Admitting: Adult Health

## 2021-12-04 ENCOUNTER — Ambulatory Visit (INDEPENDENT_AMBULATORY_CARE_PROVIDER_SITE_OTHER): Payer: No Typology Code available for payment source | Admitting: Adult Health

## 2021-12-06 ENCOUNTER — Other Ambulatory Visit: Payer: Self-pay

## 2021-12-06 ENCOUNTER — Ambulatory Visit (INDEPENDENT_AMBULATORY_CARE_PROVIDER_SITE_OTHER): Payer: No Typology Code available for payment source | Admitting: Adult Health

## 2021-12-06 ENCOUNTER — Encounter (INDEPENDENT_AMBULATORY_CARE_PROVIDER_SITE_OTHER): Payer: Self-pay | Admitting: Adult Health

## 2021-12-06 VITALS — BP 121/75 | HR 68 | Temp 98.5°F | Ht 70.0 in | Wt 223.0 lb

## 2021-12-06 DIAGNOSIS — Z6832 Body mass index (BMI) 32.0-32.9, adult: Secondary | ICD-10-CM

## 2021-12-06 DIAGNOSIS — E8881 Metabolic syndrome: Secondary | ICD-10-CM

## 2021-12-06 DIAGNOSIS — E669 Obesity, unspecified: Secondary | ICD-10-CM | POA: Diagnosis not present

## 2021-12-06 DIAGNOSIS — Z9189 Other specified personal risk factors, not elsewhere classified: Secondary | ICD-10-CM

## 2021-12-06 MED ORDER — TIRZEPATIDE 15 MG/0.5ML ~~LOC~~ SOAJ
15.0000 mg | SUBCUTANEOUS | 0 refills | Status: DC
  Filled 2021-12-06: qty 6, 84d supply, fill #0
  Filled 2021-12-26: qty 2, 28d supply, fill #0

## 2021-12-19 NOTE — Progress Notes (Signed)
? ? ? ?Chief Complaint:  ? ?OBESITY ?Rhonda [name] is here to discuss her progress with her obesity treatment plan along with follow-up of her obesity related diagnoses. Rhonda [name] is on the Category 2 Plan and states she is following her eating plan approximately 50% of the time. Rhonda [name] states she is exercising  0 minutes 0 times per week. ? ?Today's visit was #: 53 ?Starting weight: 273 lbs ?Starting date: 01/09/2018 ?Today's weight: 223 lbs ?Today's date: 12/06/21 ?Total lbs lost to date: 50 lbs ?Total lbs lost since last in-office visit: 3 ? ?Interim History:  ?Rhonda [name] is currently taking [name], which was increased to '15mg'$  at last office visit.  ?She averages to eat dinner out 2 times a week, pizza and [employer].   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 ? ?Of Note- She had Mirena IUD insertion on 11/30/21. ? ?Subjective:  ? ?1. Insulin resistance ?Rhonda [name] is currently taking [name], which was increased to '15mg'$  at last office visit.  ?She denies mass in neck, dysphagia, dyspepsia, persistent hoarseness, abd pain, or N/V. ? ?2. At risk for constipation ?Rhonda [name] is at increased risk for constipation due to Rhonda [name] and obesity,inadequate water intake, changes in diet, and/or use of medications such as GLP1 agonists. Rhonda [name] denies hard, infrequent stools currently.  ? ? ?Assessment/Plan:  ? ?1. Insulin resistance ?We will refill [name] '15mg'$  once weekly for 1 month with 0 refills. ?- Refill tirzepatide ([name]) 15 MG/0.5ML Pen; Inject 15 mg into the skin once a week.  Dispense: 6 mL; Refill: 0 ? ?2. At risk for constipation ?Rhonda [name] was given approximately 15 minutes of counseling today regarding prevention of constipation. She was encouraged to increase water and fiber intake.  ? ? ?3. Obesity: BMI 32.0 ? ? ?Rhonda [name] is currently in the action stage of change. As such, her goal is to continue with weight loss efforts. She has agreed to the Category 2 Plan.  ? ?Exercise goals:  As is. ? ?Behavioral modification strategies: increasing lean protein intake, decreasing simple carbohydrates, meal planning and cooking strategies, keeping healthy foods in the home, and planning for success. ? ?Rhonda [name] has agreed to follow-up with our clinic in 4 weeks. She was informed of the importance of frequent follow-up visits to maximize her success with intensive lifestyle modifications for her multiple health conditions.  ? ?Objective:  ? ?Blood pressure 121/75, pulse 68, temperature 98.5 ?F (36.9 ?C), height '5\' 10"'$  (1.778 m), weight 223 lb (101.2 kg), SpO2 99 %. ?Body mass index is 32 kg/m?. ? ?General: Cooperative, alert, well developed, in no acute distress. ?HEENT: Conjunctivae and lids unremarkable. ?Cardiovascular: Regular rhythm.  ?Lungs: Normal work of breathing. ?Neurologic: No focal deficits.  ? ?Lab Results  ?Component Value Date  ? CREATININE 1.04 (H) 08/23/2021  ? BUN 12 08/23/2021  ? NA 141 08/23/2021  ? K 4.5 08/23/2021  ? CL 103 08/23/2021  ? CO2 21 08/23/2021  ? ?Lab Results  ?Component Value Date  ? ALT 31 08/23/2021  ? AST 28 08/23/2021  ? ALKPHOS 77 08/23/2021  ? BILITOT 0.6 08/23/2021  ? ?Lab Results  ?Component Value Date  ? HGBA1C 5.0 08/23/2021  ? HGBA1C 5.0 03/07/2021  ? HGBA1C 5.0 07/19/2020  ? HGBA1C 5.2 01/25/2020  ? HGBA1C 5.2 09/21/2019  ? ?Lab Results  ?Component Value Date  ? INSULIN 7.0 08/23/2021  ? INSULIN 20.8 03/07/2021  ? INSULIN 34.0 (H) 07/19/2020  ? INSULIN 9.9 01/25/2020  ? INSULIN 20.3 09/21/2019  ? ?Lab Results  ?  Component Value Date  ? TSH 2.280 03/07/2021  ? ?Lab Results  ?Component Value Date  ? CHOL 133 03/07/2021  ? HDL 41 03/07/2021  ? [place] 76 03/07/2021  ? TRIG 84 03/07/2021  ? ?Lab Results  ?Component Value Date  ? VD25OH 70.0 03/07/2021  ? VD25OH 44.3 07/19/2020  ? VD25OH 46.7 01/25/2020  ? ?Lab Results  ?Component Value Date  ? WBC 4.7 01/09/2018  ? HGB 14.5 01/09/2018  ? HCT 42.1 01/09/2018  ? MCV 89 01/09/2018  ? PLT 195 09/20/2016  ? ?No results  found for: IRON, TIBC, FERRITIN ? ?Attestation Statements:  ? ?Reviewed by clinician on day of visit: allergies, medications, problem list, medical history, surgical history, family history, social history, and previous encounter notes. ? ?I, Brendell Tyus, RMA, am acting as transcriptionist for Mina Marble, NP. ? ?I have reviewed the above documentation for accuracy and completeness, and I agree with the above. -  Gid Schoffstall d. Soleil Mas, NP-C ?

## 2021-12-26 ENCOUNTER — Other Ambulatory Visit: Payer: Self-pay

## 2021-12-28 ENCOUNTER — Other Ambulatory Visit: Payer: Self-pay

## 2021-12-28 MED ORDER — FLUCONAZOLE 150 MG PO TABS
150.0000 mg | ORAL_TABLET | Freq: Once | ORAL | 0 refills | Status: AC
  Filled 2021-12-28: qty 1, 1d supply, fill #0

## 2022-01-04 ENCOUNTER — Ambulatory Visit (INDEPENDENT_AMBULATORY_CARE_PROVIDER_SITE_OTHER): Payer: No Typology Code available for payment source | Admitting: Psychiatry

## 2022-01-04 ENCOUNTER — Other Ambulatory Visit: Payer: Self-pay

## 2022-01-04 ENCOUNTER — Encounter: Payer: Self-pay | Admitting: Psychiatry

## 2022-01-04 VITALS — BP 117/83 | HR 72 | Temp 98.7°F | Wt 224.8 lb

## 2022-01-04 DIAGNOSIS — F5105 Insomnia due to other mental disorder: Secondary | ICD-10-CM | POA: Diagnosis not present

## 2022-01-04 DIAGNOSIS — F411 Generalized anxiety disorder: Secondary | ICD-10-CM | POA: Diagnosis not present

## 2022-01-04 DIAGNOSIS — R41840 Attention and concentration deficit: Secondary | ICD-10-CM | POA: Diagnosis not present

## 2022-01-04 DIAGNOSIS — F3342 Major depressive disorder, recurrent, in full remission: Secondary | ICD-10-CM

## 2022-01-04 MED ORDER — BUSPIRONE HCL 15 MG PO TABS
15.0000 mg | ORAL_TABLET | Freq: Two times a day (BID) | ORAL | 1 refills | Status: DC
  Filled 2022-01-04 – 2022-11-07 (×2): qty 180, 90d supply, fill #0

## 2022-01-04 MED ORDER — BUPROPION HCL ER (XL) 300 MG PO TB24
ORAL_TABLET | Freq: Every day | ORAL | 1 refills | Status: DC
  Filled 2022-01-04: qty 90, fill #0
  Filled 2022-04-23: qty 90, 90d supply, fill #0
  Filled 2022-08-06: qty 90, 90d supply, fill #1

## 2022-01-04 NOTE — Progress Notes (Signed)
[facility] MD OP Progress Note  01/04/2022 6:48 PM Rhonda [name]  MRN:  [id number]  Chief Complaint:  Chief Complaint  Patient presents with   Follow-up: 36 year old Caucasian female with history of depression, anxiety, separation from husband, presented for medication management.   HPI: Rhonda [name] is a 36 year old Caucasian female, separated, employed, has a history of depression, GAD, insomnia was evaluated in office today.  Patient today reports she is currently struggling with separation from her husband.  She reports she was hoping this to be a peaceful separation however it is turning out into a contested divorce.  She has an appointment with a new lawyer.  Patient reports this does worry her and has been affecting her mood.  Patient also struggles with sleep, reports it is likely due to her not having a good sleep hygiene and tries to stay up later after the kids go to sleep.  She reports she uses that time to watch TV and do things that she enjoys.  That likely could be making sleep restless and limited.  Patient does report that she struggles with impulsivity in certain areas like spending sprees when she has money available.  She also reports procrastination as well as difficulty finding words which has been getting worse since the past few months.  She reports she is interested in neuropsychological testing for ruling out ADHD.  She has always wondered whether she has it.  Otherwise patient would like to stay on her current medication regimen, is not interested in making any changes.  Is willing to work with her therapist .  Patient denies any suicidality, homicidality or perceptual disturbances.    Patient denies any other concerns today.  Visit Diagnosis:    ICD-10-CM   1. MDD (major depressive disorder), recurrent, in full remission ([place])  F33.42 busPIRone (BUSPAR) 15 MG tablet    2. GAD (generalized anxiety disorder)  F41.1 busPIRone (BUSPAR) 15 MG tablet    3. Insomnia  due to mental condition  F51.05 buPROPion (WELLBUTRIN XL) 300 MG 24 hr tablet   mood    4. Attention and concentration deficit  R41.840       Past Psychiatric History: Reviewed past psychiatric history from progress note on 11/05/2018.  Past Medical History:  Past Medical History:  Diagnosis Date   Depression    treated   PCOS (polycystic ovarian syndrome) 2008   Plantar fasciitis     Past Surgical History:  Procedure Laterality Date   CHOLECYSTECTOMY      Family Psychiatric History: Reviewed family psychiatric history from progress note on 11/05/2018.  Family History:  Family History  Problem Relation Age of Onset   High blood pressure Mother    Depression Father    Liver disease Father    Alcoholism Father    Alcohol abuse Father    Diabetes Paternal Grandmother    Bipolar disorder Maternal Aunt     Social History: Reviewed social history from progress note on 11/05/2018. Social History   Socioeconomic History   Marital status: Legally Separated    Spouse name: [name]   Number of children: 2   Years of education: Not on file   Highest education level: Bachelor's degree (e.g., BA, AB, BS)  Occupational History   Occupation: RN  Tobacco Use   Smoking status: Never   Smokeless tobacco: Never  Vaping Use   Vaping Use: Never used  Substance and Sexual Activity   Alcohol use: Yes    Alcohol/week: 1.0 standard drink  Types: 1 Glasses of wine per week    Comment: Occasional once a week.   Drug use: No   Sexual activity: Yes    Partners: Male    Birth control/protection: None, I.U.D.  Other Topics Concern   Not on file  Social History Narrative   Not on file   Social Determinants of Health   Financial Resource Strain: Not on file  Food Insecurity: Not on file  Transportation Needs: Not on file  Physical Activity: Not on file  Stress: Not on file  Social Connections: Not on file    Allergies: No Known Allergies  Metabolic Disorder Labs: Lab  Results  Component Value Date   HGBA1C 5.0 08/23/2021   No results found for: PROLACTIN Lab Results  Component Value Date   CHOL 133 03/07/2021   TRIG 84 03/07/2021   HDL 41 03/07/2021   LDLCALC 76 03/07/2021   LDLCALC 97 01/09/2018   Lab Results  Component Value Date   TSH 2.280 03/07/2021   TSH 1.470 01/09/2018    Therapeutic Level Labs: No results found for: LITHIUM No results found for: VALPROATE No components found for:  CBMZ  Current Medications: Current Outpatient Medications  Medication Sig Dispense Refill   buPROPion (WELLBUTRIN XL) 300 MG 24 hr tablet TAKE 1 TABLET (300 MG TOTAL) BY MOUTH DAILY. 90 tablet 1   busPIRone (BUSPAR) 15 MG tablet Take 1 tablet (15 mg total) by mouth 2 (two) times daily. 180 tablet 1   Cholecalciferol (VITAMIN D) 125 MCG (5000 UT) CAPS Take 1 capsule by mouth daily. 30 capsule 0   cyclobenzaprine (FLEXERIL) 5 MG tablet Take 1 tablet (5 mg total) by mouth nightly as needed 30 tablet 1   ELDERBERRY PO Take by mouth.     levocetirizine (XYZAL) 5 MG tablet Take 5 mg by mouth every evening.     levonorgestrel (MIRENA) 20 MCG/24HR IUD 1 each by Intrauterine route once.     levonorgestrel (MIRENA) 20 MCG/DAY IUD by Intrauterine route.     Multiple Vitamin (MULTIVITAMIN) tablet Take 1 tablet by mouth daily.     Probiotic Product (PROBIOTIC-10 PO) Take by mouth.     spironolactone (ALDACTONE) 100 MG tablet TAKE 1 TABLET BY MOUTH ONCE DAILY 90 tablet 3   tirzepatide ([name]) 15 MG/0.5ML Pen Inject 15 mg into the skin once a week. 6 mL 0   doxepin (SINEQUAN) 10 MG capsule TAKE 1 TO 2 CAPSULES BY MOUTH AT BEDTIME AS NEEDED FOR SLEEP 30 capsule 1   No current facility-administered medications for this visit.     Musculoskeletal: Strength & Muscle Tone: within normal limits Gait & Station: normal Patient leans: N/A  Psychiatric Specialty Exam: Review of Systems  Neurological:        Reports word finding difficulty ongoing since the past  several years-getting worse  Psychiatric/Behavioral:  Positive for sleep disturbance. The patient is nervous/anxious.   All other systems reviewed and are negative.  Blood pressure 117/83, pulse 72, temperature 98.7 F (37.1 C), temperature source Temporal, weight 224 lb 12.8 oz (102 kg).Body mass index is 32.26 kg/m.  General Appearance: Casual  Eye Contact:  Fair  Speech:  Clear and Coherent  Volume:  Normal  Mood:  Anxious  Affect:  Congruent  Thought Process:  Goal Directed and Descriptions of Associations: Intact  Orientation:  Full (Time, Place, and Person)  Thought Content: Logical   Suicidal Thoughts:  No  Homicidal Thoughts:  No  Memory:  Immediate;   Fair  Recent;   Fair Remote;   Fair  Judgement:  Fair  Insight:  Fair  Psychomotor Activity:  Normal  Concentration:  Concentration: Fair and Attention Span: Fair  Recall:  [employer] of Knowledge: Fair  Language: Fair  Akathisia:  No  Handed:  Right  AIMS (if indicated): not done  Assets:  Communication Skills Desire for Improvement Housing Talents/Skills Transportation  ADL's:  Intact  Cognition: WNL  Sleep:  Poor   Screenings: GAD-7    Flowsheet Row Video Visit from 12/07/2020 in [facility]  Total GAD-7 Score 1      PHQ2-9    [facility] [facility] Visit from 01/04/2022 in [facility] Office Visit from 09/14/2021 in [facility] Video Visit from 03/07/2021 in [facility] Video Visit from 12/07/2020 in [facility] Video Visit from 10/19/2020 in [facility]  PHQ-2 Total Score 2 0 0 1 1  PHQ-9 Total Score 10 -- -- -- --      Flowsheet Row Video Visit from 03/07/2021 in [facility] Video Visit from 10/19/2020 in [facility] No Risk Low Risk        Assessment and  Plan: SWAY GUTTIERREZ is a 36 year old Caucasian female, separated, employed, has a history of MDD, GAD was evaluated in office today.  Patient struggling with procrastination, impulsivity, word finding difficulty about like to be referred for neuropsychological testing for diagnostic clarification.  Patient with sleep problems, psychosocial stressors of going through a contested divorce, will continue to benefit from psychotherapy sessions as well as medication management.  Plan MDD in remission Wellbutrin XL 300 mg p.o. daily L-methylfolate as prescribed  GAD-unstable Continue BuSpar 15 mg p.o. twice daily.  Patient is not interested in dosage increase today. Continue CBT with Ms.Freddi Che Start hydroxyzine 25 mg daily as needed for severe anxiety symptoms.  She has supplies.  Insomnia-unstable Discussed sleep hygiene techniques. Doxepin 10 mg p.o. nightly as needed available although she has not been using it. Patient wants to work on sleep hygiene.  Attention and concentration deficit-we will refer for neuropsychological testing for ADHD.  Follow-up in clinic in 3 months or sooner if needed.  This note was generated in part or whole with voice recognition software. Voice recognition is usually quite accurate but there are transcription errors that can and very often do occur. I apologize for any typographical errors that were not detected and corrected.   This note was generated in part or whole with voice recognition software. Voice recognition is usually quite accurate but there are transcription errors that can and very often do occur. I apologize for any typographical errors that were not detected and corrected.      Ursula Alert, MD 01/04/2022, 6:48 PM

## 2022-01-08 ENCOUNTER — Ambulatory Visit (INDEPENDENT_AMBULATORY_CARE_PROVIDER_SITE_OTHER): Payer: No Typology Code available for payment source | Admitting: Adult Health

## 2022-01-12 ENCOUNTER — Encounter: Payer: Self-pay | Admitting: Psychology

## 2022-01-31 ENCOUNTER — Other Ambulatory Visit: Payer: Self-pay

## 2022-01-31 ENCOUNTER — Encounter (INDEPENDENT_AMBULATORY_CARE_PROVIDER_SITE_OTHER): Payer: Self-pay | Admitting: Adult Health

## 2022-01-31 ENCOUNTER — Ambulatory Visit (INDEPENDENT_AMBULATORY_CARE_PROVIDER_SITE_OTHER): Payer: No Typology Code available for payment source | Admitting: Adult Health

## 2022-01-31 ENCOUNTER — Other Ambulatory Visit: Payer: Self-pay | Admitting: Psychiatry

## 2022-01-31 VITALS — BP 113/75 | HR 73 | Temp 97.6°F | Ht 70.0 in | Wt 217.0 lb

## 2022-01-31 DIAGNOSIS — Z6831 Body mass index (BMI) 31.0-31.9, adult: Secondary | ICD-10-CM

## 2022-01-31 DIAGNOSIS — F5105 Insomnia due to other mental disorder: Secondary | ICD-10-CM

## 2022-01-31 DIAGNOSIS — E8881 Metabolic syndrome: Secondary | ICD-10-CM | POA: Diagnosis not present

## 2022-01-31 DIAGNOSIS — Z7985 Long-term (current) use of injectable non-insulin antidiabetic drugs: Secondary | ICD-10-CM | POA: Diagnosis not present

## 2022-01-31 DIAGNOSIS — E669 Obesity, unspecified: Secondary | ICD-10-CM | POA: Diagnosis not present

## 2022-01-31 MED ORDER — DOXEPIN HCL 10 MG PO CAPS
ORAL_CAPSULE | ORAL | 1 refills | Status: DC
  Filled 2022-01-31: qty 30, 15d supply, fill #0

## 2022-01-31 MED ORDER — TIRZEPATIDE 15 MG/0.5ML ~~LOC~~ SOAJ
15.0000 mg | SUBCUTANEOUS | 0 refills | Status: DC
  Filled 2022-01-31: qty 2, 28d supply, fill #0
  Filled 2022-03-02: qty 2, 28d supply, fill #1

## 2022-01-31 NOTE — Progress Notes (Signed)
Chief Complaint:   OBESITY Rhonda [name] is here to discuss her progress with her obesity treatment plan along with follow-up of her obesity related diagnoses. Rhonda [name] is on the Category 2 Plan and states she is following her eating plan approximately 80% of the time. Rhonda [name] states she is doing 0 minutes 0 times per week.  Today's visit was #: 11 Starting weight: 273 lbs Starting date: 01/09/2018 Today's weight: 217 lbs Today's date: 01/31/2022 Total lbs lost to date: 38 Total lbs lost since last in-office visit: 7  Interim History:  Saxenda replaced Wegovy, then Wegovy replaced [name].   She has been on [name] since 04/2021.   [name] was increased to the max dose of 15 mg on 10/10/2021.   Last office visit with healthy weight and wellness was 12/06/2021.    She graduated with CNS degree school in May 2023.   She is looking for a new position with her advanced degree.   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  Subjective:   1. Insulin resistance Saxenda replaced Wegovy, then [facility] replaced [name].   She has been on [name] since 04/2021.   [name] was increased to the max dose of 15 mg on 10/10/2021.   Last office visit with healthy weight and wellness was 12/06/2021.     Assessment/Plan:   1. Insulin resistance Refill [name] 15 mg once weekly for 1 month. If unable to obtain [facility]- back up plan is Wegovy 1.7 mg.  - tirzepatide ([name]) 15 MG/0.5ML Pen; Inject 15 mg into the skin once a week.  Dispense: 6 mL; Refill: 0  2. Obesity, current BMI 31.1 Rhonda [name] is currently in the action stage of change. As such, her goal is to continue with weight loss efforts. She has agreed to the Category 2 Plan.   We discussed various medication options to help Rhonda [name] with her weight loss efforts and we both agreed to stop [name], and start Wegovy 1.7 mg once weekly dispense 3 mL, with no refills.  Exercise goals: No exercise has been prescribed at this time.  Behavioral  modification strategies: increasing lean protein intake, decreasing simple carbohydrates, meal planning and cooking strategies, keeping healthy foods in the home, and planning for success.  Rhonda [name] has agreed to follow-up with our clinic in 4 to 6 weeks. She was informed of the importance of frequent follow-up visits to maximize her success with intensive lifestyle modifications for her multiple health conditions.   Objective:   Blood pressure 113/75, pulse 73, temperature 97.6 F (36.4 C), height '5\' 10"'$  (1.778 m), weight 217 lb (98.4 kg), SpO2 100 %. Body mass index is 31.14 kg/m.  General: Cooperative, alert, well developed, in no acute distress. HEENT: Conjunctivae and lids unremarkable. Cardiovascular: Regular rhythm.  Lungs: Normal work of breathing. Neurologic: No focal deficits.   Lab Results  Component Value Date   CREATININE 1.04 (H) 08/23/2021   BUN 12 08/23/2021   NA 141 08/23/2021   K 4.5 08/23/2021   CL 103 08/23/2021   CO2 21 08/23/2021   Lab Results  Component Value Date   ALT 31 08/23/2021   AST 28 08/23/2021   ALKPHOS 77 08/23/2021   BILITOT 0.6 08/23/2021   Lab Results  Component Value Date   HGBA1C 5.0 08/23/2021   HGBA1C 5.0 03/07/2021   HGBA1C 5.0 07/19/2020   HGBA1C 5.2 01/25/2020   HGBA1C 5.2 09/21/2019   Lab Results  Component Value Date   INSULIN 7.0 08/23/2021   INSULIN 20.8 03/07/2021   INSULIN  34.0 (H) 07/19/2020   INSULIN 9.9 01/25/2020   INSULIN 20.3 09/21/2019   Lab Results  Component Value Date   TSH 2.280 03/07/2021   Lab Results  Component Value Date   CHOL 133 03/07/2021   HDL 41 03/07/2021   LDLCALC 76 03/07/2021   TRIG 84 03/07/2021   Lab Results  Component Value Date   VD25OH 70.0 03/07/2021   VD25OH 44.3 07/19/2020   VD25OH 46.7 01/25/2020   Lab Results  Component Value Date   WBC 4.7 01/09/2018   HGB 14.5 01/09/2018   HCT 42.1 01/09/2018   MCV 89 01/09/2018   PLT 195 09/20/2016   No results found for:  "IRON", "TIBC", "FERRITIN"  Attestation Statements:   Reviewed by clinician on day of visit: allergies, medications, problem list, medical history, surgical history, family history, social history, and previous encounter notes.   Wilhemena Durie, am acting as transcriptionist for Mina Marble, NP.  I have reviewed the above documentation for accuracy and completeness, and I agree with the above. -  Tywanda Rice d. Amiya Escamilla, NP-C

## 2022-02-01 ENCOUNTER — Encounter (INDEPENDENT_AMBULATORY_CARE_PROVIDER_SITE_OTHER): Payer: Self-pay | Admitting: Adult Health

## 2022-02-01 ENCOUNTER — Other Ambulatory Visit: Payer: Self-pay

## 2022-02-01 NOTE — Telephone Encounter (Signed)
FYI

## 2022-02-26 ENCOUNTER — Other Ambulatory Visit: Payer: Self-pay

## 2022-02-26 MED ORDER — PROPRANOLOL HCL 20 MG PO TABS
ORAL_TABLET | ORAL | 1 refills | Status: DC
  Filled 2022-02-26: qty 60, 30d supply, fill #0

## 2022-02-27 ENCOUNTER — Other Ambulatory Visit: Payer: Self-pay

## 2022-03-02 ENCOUNTER — Other Ambulatory Visit: Payer: Self-pay

## 2022-03-05 ENCOUNTER — Other Ambulatory Visit: Payer: Self-pay

## 2022-03-06 ENCOUNTER — Other Ambulatory Visit: Payer: Self-pay

## 2022-03-06 ENCOUNTER — Encounter (INDEPENDENT_AMBULATORY_CARE_PROVIDER_SITE_OTHER): Payer: Self-pay | Admitting: Adult Health

## 2022-03-06 ENCOUNTER — Ambulatory Visit (INDEPENDENT_AMBULATORY_CARE_PROVIDER_SITE_OTHER): Payer: No Typology Code available for payment source | Admitting: Adult Health

## 2022-03-06 VITALS — BP 111/70 | HR 77 | Temp 98.3°F | Ht 70.0 in | Wt 218.0 lb

## 2022-03-06 DIAGNOSIS — Z0000 Encounter for general adult medical examination without abnormal findings: Secondary | ICD-10-CM

## 2022-03-06 DIAGNOSIS — E559 Vitamin D deficiency, unspecified: Secondary | ICD-10-CM | POA: Diagnosis not present

## 2022-03-06 DIAGNOSIS — E669 Obesity, unspecified: Secondary | ICD-10-CM

## 2022-03-06 DIAGNOSIS — Z9189 Other specified personal risk factors, not elsewhere classified: Secondary | ICD-10-CM

## 2022-03-06 DIAGNOSIS — E8881 Metabolic syndrome: Secondary | ICD-10-CM

## 2022-03-06 DIAGNOSIS — R7401 Elevation of levels of liver transaminase levels: Secondary | ICD-10-CM | POA: Diagnosis not present

## 2022-03-06 DIAGNOSIS — Z6831 Body mass index (BMI) 31.0-31.9, adult: Secondary | ICD-10-CM

## 2022-03-06 MED ORDER — TIRZEPATIDE 12.5 MG/0.5ML ~~LOC~~ SOAJ
12.5000 mg | SUBCUTANEOUS | 0 refills | Status: DC
  Filled 2022-03-06: qty 2, 28d supply, fill #0

## 2022-03-07 NOTE — Progress Notes (Signed)
Chief Complaint:   OBESITY Rhonda [name] is here to discuss her progress with her obesity treatment plan along with follow-up of her obesity related diagnoses. Rhonda [name] is on the Category 2 Plan and states she is following her eating plan approximately 50% of the time. Rhonda [name] states she is doing 0 minutes 0 times per week.  Today's visit was #: 38 Starting weight: 273 lbs Starting date: 01/09/2018 Today's weight: 218 lbs Today's date: 03/06/2022 Total lbs lost to date: 55 Total lbs lost since last in-office visit: 0  Interim History:  Rhonda [name] started [place] in 04/2021.  She has been on the max dose of [name] at 15 mg since 10/10/2021. She denies SE with [name] therapy.  Subjective:   1. Insulin resistance Elayjah started [place] in 04/2021.  She has been on the max dose of [name] at 15 mg since 10/10/2021. She denies mass in neck, dysphagia, dyspepsia, persistent hoarsenes, abd pain, or N/V/Constipation. Rhonda [name] is ready to wean down [name].  2. Vitamin D deficiency Rhonda [name] is on OTC vitamin D3 5000 units daily.  3. Health care maintenance Rhonda [name] denies tobacco or vape use. Lipid Panel 03/07/21    Component Value Date/Time   CHOL 133 03/07/2021 0843   TRIG 84 03/07/2021 0843   HDL 41 03/07/2021 0843   LDLCALC 76 03/07/2021 0843   LABVLDL 16 03/07/2021 0843     4. Transaminitis In January 2023, Farhana's CMP-LFTs within normal limits.  She has a history of elevated LFTs.  5. At risk for constipation Rhonda [name] is at increased risk for constipation due to [facility] for insulin resistance.  Assessment/Plan:   1. Insulin resistance Future labs were entered into the system.   Decrease and RF [name] 12.5 mg once weekly, and we will refill for 1 month.  2. Vitamin D deficiency Future labs were entered into the system.  3. Health care maintenance Future labs were entered into the system.  4. Transaminitis Future labs were entered into the system.  5. At risk  for constipation Rhonda [name] was given approximately 15 minutes of counseling today regarding prevention of constipation. She was encouraged to increase water and fiber intake.   6. Obesity, current BMI 31.3 Rhonda [name] is currently in the action stage of change. As such, her goal is to continue with weight loss efforts. She has agreed to the Category 2 Plan.   Beginning weaning down [facility] therapy.  Handout: 100/200 calorie snack lists.  *Future labs were entered into the system*.  Exercise goals: Kid free days-exercise (2). 60 minutes: 10 minutes-warm up, 20 minutes-main muscle grip, 8 minutes-abs, and 10 minutes-core.  Behavioral modification strategies: increasing lean protein intake, decreasing simple carbohydrates, meal planning and cooking strategies, keeping healthy foods in the home, and planning for success.  Rhonda [name] has agreed to follow-up with our clinic in 4 weeks. She was informed of the importance of frequent follow-up visits to maximize her success with intensive lifestyle modifications for her multiple health conditions.   Objective:   Blood pressure 111/70, pulse 77, temperature 98.3 F (36.8 C), height '5\' 10"'$  (1.778 m), weight 218 lb (98.9 kg), SpO2 100 %. Body mass index is 31.28 kg/m.  General: Cooperative, alert, well developed, in no acute distress. HEENT: Conjunctivae and lids unremarkable. Cardiovascular: Regular rhythm.  Lungs: Normal work of breathing. Neurologic: No focal deficits.   Lab Results  Component Value Date   CREATININE 1.04 (H) 08/23/2021   BUN 12 08/23/2021   NA 141 08/23/2021   K 4.5 08/23/2021  CL 103 08/23/2021   CO2 21 08/23/2021   Lab Results  Component Value Date   ALT 31 08/23/2021   AST 28 08/23/2021   ALKPHOS 77 08/23/2021   BILITOT 0.6 08/23/2021   Lab Results  Component Value Date   HGBA1C 5.0 08/23/2021   HGBA1C 5.0 03/07/2021   HGBA1C 5.0 07/19/2020   HGBA1C 5.2 01/25/2020   HGBA1C 5.2 09/21/2019   Lab Results   Component Value Date   INSULIN 7.0 08/23/2021   INSULIN 20.8 03/07/2021   INSULIN 34.0 (H) 07/19/2020   INSULIN 9.9 01/25/2020   INSULIN 20.3 09/21/2019   Lab Results  Component Value Date   TSH 2.280 03/07/2021   Lab Results  Component Value Date   CHOL 133 03/07/2021   HDL 41 03/07/2021   LDLCALC 76 03/07/2021   TRIG 84 03/07/2021   Lab Results  Component Value Date   VD25OH 70.0 03/07/2021   VD25OH 44.3 07/19/2020   VD25OH 46.7 01/25/2020   Lab Results  Component Value Date   WBC 4.7 01/09/2018   HGB 14.5 01/09/2018   HCT 42.1 01/09/2018   MCV 89 01/09/2018   PLT 195 09/20/2016   No results found for: "IRON", "TIBC", "FERRITIN"  Attestation Statements:   Reviewed by clinician on day of visit: allergies, medications, problem list, medical history, surgical history, family history, social history, and previous encounter notes.   Wilhemena Durie, am acting as transcriptionist for Mina Marble, NP.  I have reviewed the above documentation for accuracy and completeness, and I agree with the above. -  Gregg Holster d. Aviance [place], NP-C

## 2022-03-08 DIAGNOSIS — Z0000 Encounter for general adult medical examination without abnormal findings: Secondary | ICD-10-CM | POA: Insufficient documentation

## 2022-03-28 ENCOUNTER — Encounter (INDEPENDENT_AMBULATORY_CARE_PROVIDER_SITE_OTHER): Payer: Self-pay

## 2022-04-02 ENCOUNTER — Other Ambulatory Visit (INDEPENDENT_AMBULATORY_CARE_PROVIDER_SITE_OTHER): Payer: Self-pay | Admitting: Adult Health

## 2022-04-03 ENCOUNTER — Encounter (INDEPENDENT_AMBULATORY_CARE_PROVIDER_SITE_OTHER): Payer: Self-pay | Admitting: Adult Health

## 2022-04-03 ENCOUNTER — Other Ambulatory Visit: Payer: Self-pay

## 2022-04-03 ENCOUNTER — Ambulatory Visit (INDEPENDENT_AMBULATORY_CARE_PROVIDER_SITE_OTHER): Payer: No Typology Code available for payment source | Admitting: Adult Health

## 2022-04-03 VITALS — BP 124/77 | HR 71 | Temp 98.0°F | Ht 70.0 in | Wt 219.0 lb

## 2022-04-03 DIAGNOSIS — E559 Vitamin D deficiency, unspecified: Secondary | ICD-10-CM | POA: Diagnosis not present

## 2022-04-03 DIAGNOSIS — Z6831 Body mass index (BMI) 31.0-31.9, adult: Secondary | ICD-10-CM

## 2022-04-03 DIAGNOSIS — Z0000 Encounter for general adult medical examination without abnormal findings: Secondary | ICD-10-CM

## 2022-04-03 DIAGNOSIS — E8881 Metabolic syndrome: Secondary | ICD-10-CM | POA: Diagnosis not present

## 2022-04-03 DIAGNOSIS — R7989 Other specified abnormal findings of blood chemistry: Secondary | ICD-10-CM

## 2022-04-03 DIAGNOSIS — E669 Obesity, unspecified: Secondary | ICD-10-CM | POA: Diagnosis not present

## 2022-04-03 LAB — COMPREHENSIVE METABOLIC PANEL
ALT: 23 IU/L (ref 0–32)
AST: 21 IU/L (ref 0–40)
Albumin/Globulin Ratio: 2.1 (ref 1.2–2.2)
Albumin: 4.7 g/dL (ref 3.9–4.9)
Alkaline Phosphatase: 54 IU/L (ref 44–121)
BUN/Creatinine Ratio: 9 (ref 9–23)
BUN: 8 mg/dL (ref 6–20)
Bilirubin Total: 0.4 mg/dL (ref 0.0–1.2)
CO2: 21 mmol/L (ref 20–29)
Calcium: 9.5 mg/dL (ref 8.7–10.2)
Chloride: 103 mmol/L (ref 96–106)
Creatinine, Ser: 0.91 mg/dL (ref 0.57–1.00)
Globulin, Total: 2.2 g/dL (ref 1.5–4.5)
Glucose: 85 mg/dL (ref 70–99)
Potassium: 4.3 mmol/L (ref 3.5–5.2)
Sodium: 137 mmol/L (ref 134–144)
Total Protein: 6.9 g/dL (ref 6.0–8.5)
eGFR: 84 mL/min/{1.73_m2} (ref >59)

## 2022-04-03 LAB — LIPID PANEL
Chol/HDL Ratio: 2.6 ratio (ref 0.0–4.4)
Cholesterol, Total: 143 mg/dL (ref 100–199)
HDL: 54 mg/dL (ref >39)
LDL Chol Calc (NIH): 75 mg/dL (ref 0–99)
Triglycerides: 68 mg/dL (ref 0–149)
VLDL Cholesterol Cal: 14 mg/dL (ref 5–40)

## 2022-04-03 LAB — HEMOGLOBIN A1C
Est. average glucose Bld gHb Est-mCnc: 94 mg/dL
Hgb A1c MFr Bld: 4.9 % (ref 4.8–5.6)

## 2022-04-03 LAB — VITAMIN D 25 HYDROXY (VIT D DEFICIENCY, FRACTURES): Vit D, 25-Hydroxy: 49.5 ng/mL (ref 30.0–100.0)

## 2022-04-03 LAB — INSULIN, RANDOM: INSULIN: 17.1 u[IU]/mL (ref 2.6–24.9)

## 2022-04-03 MED ORDER — TIRZEPATIDE 12.5 MG/0.5ML ~~LOC~~ SOAJ
12.5000 mg | SUBCUTANEOUS | 0 refills | Status: DC
  Filled 2022-04-03: qty 2, 28d supply, fill #0

## 2022-04-09 NOTE — Progress Notes (Unsigned)
Chief Complaint:   OBESITY Rhonda [name] is here to discuss her progress with her obesity treatment plan along with follow-up of her obesity related diagnoses. Rhonda [name] is on the Category 2 Plan and states she is following her eating plan approximately 50% of the time. Rhonda [name] states she is going for 30 minutes 1 time per week.  Today's visit was #: 85 Starting weight: 273 lbs Starting date: 01/09/2018 Today's weight: 219 lbs Today's date: 04/03/22 Total lbs lost to date: 41 Total lbs lost since last in-office visit: +1  Interim History: Labs completed 04/02/2022. Bachelorette trip-[place]-3-day girl trip-  + eating, + alcohol. Bioimpedance +4.4 muscle mass                         -3.2 adipose mass.  Subjective:   1. Elevated serum creatinine 04/02/2022 CMP-serum creatinine level-0.91 GFR-84 Both levels improved. Discussed labs with patient today.  2. Health care maintenance Lipid panel *** Discussed labs with patient today.  3. Vitamin D deficiency 04/02/2022 vitamin D level 49.5. She is on OTC vitamin D3 5000 IU daily. Discussed labs with patient today.  4. Insulin resistance 04/02/2022 blood glucose 85, A1c 4.9, insulin level 17.1. Currently on [name] 12.5 mg once weekly. *** Discussed labs with patient today.   Assessment/Plan:   1. Elevated serum creatinine Avoid NSAIDs.  2. Health care maintenance Increase regular exercise.  3. Vitamin D deficiency Continue OTC supplementation.  4. Insulin resistance Refill [name] 12.5 mg once weekly, dispense 6 mL, no refills.  5. Obesity, current BMI 31.5 Refill [name].  Rhonda [name] is currently in the action stage of change. As such, her goal is to continue with weight loss efforts. She has agreed to the Category 2 Plan.   Exercise goals: as is.  Behavioral modification strategies: increasing lean protein intake, decreasing simple carbohydrates, meal planning and cooking strategies, keeping healthy foods in the  home, and planning for success.  Rhonda [name] has agreed to follow-up with our clinic in 4 weeks. She was informed of the importance of frequent follow-up visits to maximize her success with intensive lifestyle modifications for her multiple health conditions.   Objective:   Blood pressure 124/77, pulse 71, temperature 98 F (36.7 C), height '5\' 10"'$  (1.778 m), weight 219 lb (99.3 kg), SpO2 99 %. Body mass index is 31.42 kg/m.  General: Cooperative, alert, well developed, in no acute distress. HEENT: Conjunctivae and lids unremarkable. Cardiovascular: Regular rhythm.  Lungs: Normal work of breathing. Neurologic: No focal deficits.   Lab Results  Component Value Date   CREATININE 0.91 04/02/2022   BUN 8 04/02/2022   NA 137 04/02/2022   K 4.3 04/02/2022   CL 103 04/02/2022   CO2 21 04/02/2022   Lab Results  Component Value Date   ALT 23 04/02/2022   AST 21 04/02/2022   ALKPHOS 54 04/02/2022   BILITOT 0.4 04/02/2022   Lab Results  Component Value Date   HGBA1C 4.9 04/02/2022   HGBA1C 5.0 08/23/2021   HGBA1C 5.0 03/07/2021   HGBA1C 5.0 07/19/2020   HGBA1C 5.2 01/25/2020   Lab Results  Component Value Date   INSULIN 17.1 04/02/2022   INSULIN 7.0 08/23/2021   INSULIN 20.8 03/07/2021   INSULIN 34.0 (H) 07/19/2020   INSULIN 9.9 01/25/2020   Lab Results  Component Value Date   TSH 2.280 03/07/2021   Lab Results  Component Value Date   CHOL 143 04/02/2022   HDL 54 04/02/2022   [place]  75 04/02/2022   TRIG 68 04/02/2022   CHOLHDL 2.6 04/02/2022   Lab Results  Component Value Date   VD25OH 49.5 04/02/2022   VD25OH 70.0 03/07/2021   VD25OH 44.3 07/19/2020   Lab Results  Component Value Date   WBC 4.7 01/09/2018   HGB 14.5 01/09/2018   HCT 42.1 01/09/2018   MCV 89 01/09/2018   PLT 195 09/20/2016   No results found for: "IRON", "TIBC", "FERRITIN"   Attestation Statements:   Reviewed by clinician on day of visit: allergies, medications, problem list, medical  history, surgical history, family history, social history, and previous encounter notes.  I, Georgianne Fick, FNP, am acting as Location manager for Mina Marble, NP.  I have reviewed the above documentation for accuracy and completeness, and I agree with the above. -  ***

## 2022-04-12 DIAGNOSIS — R7989 Other specified abnormal findings of blood chemistry: Secondary | ICD-10-CM | POA: Insufficient documentation

## 2022-04-23 ENCOUNTER — Other Ambulatory Visit: Payer: Self-pay

## 2022-04-24 ENCOUNTER — Other Ambulatory Visit: Payer: Self-pay

## 2022-04-24 MED ORDER — SPIRONOLACTONE 100 MG PO TABS
100.0000 mg | ORAL_TABLET | Freq: Every day | ORAL | 1 refills | Status: DC
  Filled 2022-04-24: qty 30, 30d supply, fill #0
  Filled 2022-06-01: qty 30, 30d supply, fill #1

## 2022-05-01 ENCOUNTER — Ambulatory Visit (INDEPENDENT_AMBULATORY_CARE_PROVIDER_SITE_OTHER): Payer: No Typology Code available for payment source | Admitting: Adult Health

## 2022-05-01 ENCOUNTER — Other Ambulatory Visit: Payer: Self-pay

## 2022-05-01 ENCOUNTER — Encounter (INDEPENDENT_AMBULATORY_CARE_PROVIDER_SITE_OTHER): Payer: Self-pay | Admitting: Adult Health

## 2022-05-01 VITALS — BP 101/64 | HR 78 | Temp 97.9°F | Ht 70.0 in | Wt 225.0 lb

## 2022-05-01 DIAGNOSIS — E8881 Metabolic syndrome: Secondary | ICD-10-CM

## 2022-05-01 DIAGNOSIS — Z6832 Body mass index (BMI) 32.0-32.9, adult: Secondary | ICD-10-CM | POA: Diagnosis not present

## 2022-05-01 DIAGNOSIS — E669 Obesity, unspecified: Secondary | ICD-10-CM | POA: Diagnosis not present

## 2022-05-01 MED ORDER — TIRZEPATIDE 12.5 MG/0.5ML ~~LOC~~ SOAJ
12.5000 mg | SUBCUTANEOUS | 0 refills | Status: DC
  Filled 2022-05-01: qty 2, 28d supply, fill #0

## 2022-05-06 NOTE — Progress Notes (Unsigned)
Chief Complaint:   OBESITY Rhonda [name] is here to discuss her progress with her obesity treatment plan along with follow-up of her obesity related diagnoses. Rhonda [name] is on the Category 2 Plan and states she is following her eating plan approximately 25% of the time. Rhonda [name] states she is walking 30 minutes 1 times per week.  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  Cath, IR, Endo, pain denies.  Subjective:   1. Insulin resistance ***  Assessment/Plan:   1. Insulin resistance Refill - tirzepatide ([name]) 12.5 MG/0.5ML Pen; Inject 12.5 mg into the skin once a week.  Dispense: 6 mL; Refill: 0  2. Obesity, current BMI 32.3 Handout:   Eating out guide.   Rhonda [name] is currently in the action stage of change. As such, her goal is to continue with weight loss efforts. She has agreed to the Category 2 Plan.   Exercise goals:  as is.   Behavioral modification strategies: increasing lean protein intake, decreasing simple carbohydrates, meal planning and cooking strategies, keeping healthy foods in the home, and planning for success.  Lindi has agreed to follow-up with our clinic in 4 weeks. She was informed of the importance of frequent follow-up visits to maximize her success with intensive lifestyle modifications for her multiple health conditions.    Blood pressure 101/64, pulse 78, temperature 97.9 F (36.6 C), height '5\' 10"'$  (1.778 m), weight 225 lb (102.1 kg), SpO2 99 %. Body mass index is 32.28 kg/m.  General: Cooperative, alert, well developed, in no acute distress. HEENT: Conjunctivae and lids unremarkable. Cardiovascular: Regular rhythm.  Lungs: Normal work of breathing. Neurologic: No focal deficits.   Lab  Results  Component Value Date   CREATININE 0.91 04/02/2022   BUN 8 04/02/2022   NA 137 04/02/2022   K 4.3 04/02/2022   CL 103 04/02/2022   CO2 21 04/02/2022   Lab Results  Component Value Date   ALT 23 04/02/2022   AST 21 04/02/2022   ALKPHOS 54 04/02/2022   BILITOT 0.4 04/02/2022   Lab Results  Component Value Date   HGBA1C 4.9 04/02/2022   HGBA1C 5.0 08/23/2021   HGBA1C 5.0 03/07/2021   HGBA1C 5.0 07/19/2020   HGBA1C 5.2 01/25/2020   Lab Results  Component Value Date   INSULIN 17.1 04/02/2022   INSULIN 7.0 08/23/2021   INSULIN 20.8 03/07/2021   INSULIN 34.0 (H) 07/19/2020   INSULIN 9.9 01/25/2020   Lab Results  Component Value Date   TSH 2.280 03/07/2021   Lab Results  Component Value Date   CHOL 143 04/02/2022   HDL 54 04/02/2022   LDLCALC 75 04/02/2022   TRIG 68 04/02/2022   CHOLHDL 2.6 04/02/2022   Lab Results  Component Value Date   VD25OH 49.5 04/02/2022   VD25OH 70.0 03/07/2021   VD25OH 44.3 07/19/2020   Lab Results  Component Value Date   WBC 4.7 01/09/2018   HGB 14.5 01/09/2018   HCT 42.1 01/09/2018   MCV 89 01/09/2018   PLT 195 09/20/2016   No results found for: "IRON", "TIBC", "FERRITIN"  Attestation Statements:   Reviewed by clinician on day of visit: allergies, medications, problem list, medical history, surgical history, family history, social history, and previous encounter notes.  I, Davy Pique,  RMA, am acting as Location manager for Mina Marble, NP.  I have reviewed the above documentation for accuracy and completeness, and I agree with the above. -  ***

## 2022-05-08 ENCOUNTER — Telehealth (INDEPENDENT_AMBULATORY_CARE_PROVIDER_SITE_OTHER): Payer: No Typology Code available for payment source | Admitting: Psychiatry

## 2022-05-08 ENCOUNTER — Encounter: Payer: Self-pay | Admitting: Psychiatry

## 2022-05-08 DIAGNOSIS — F411 Generalized anxiety disorder: Secondary | ICD-10-CM | POA: Diagnosis not present

## 2022-05-08 DIAGNOSIS — R41840 Attention and concentration deficit: Secondary | ICD-10-CM | POA: Diagnosis not present

## 2022-05-08 DIAGNOSIS — F3342 Major depressive disorder, recurrent, in full remission: Secondary | ICD-10-CM

## 2022-05-08 DIAGNOSIS — F5105 Insomnia due to other mental disorder: Secondary | ICD-10-CM

## 2022-05-08 NOTE — Progress Notes (Unsigned)
Virtual Visit via Video Note  I connected with Rhonda [name] on 05/08/22 at  4:00 PM EDT by a video enabled telemedicine application and verified that I am speaking with the correct person using two identifiers.  Location Provider Location : [facility] Patient Location : Home  Participants: Patient , Provider   I discussed the limitations of evaluation and management by telemedicine and the availability of in person appointments. The patient expressed understanding and agreed to proceed.    I discussed the assessment and treatment plan with the patient. The patient was provided an opportunity to ask questions and all were answered. The patient agreed with the plan and demonstrated an understanding of the instructions.   The patient was advised to call back or seek an in-person evaluation if the symptoms worsen or if the condition fails to improve as anticipated.    [facility] MD OP Progress Note  05/09/2022 8:57 AM LUBERTA GRABINSKI  MRN:  [id number]  Chief Complaint:  Chief Complaint  Patient presents with   Follow-up: 36 year old Caucasian female with history of depression, anxiety, presented for medication management.   HPI: AMILY DEPP is a 36 year old Caucasian female, divorced, employed, has a history of depression, anxiety was evaluated by telemedicine today.  Patient today reports she finally got her divorced settled.  She is not happy about the fact that she has to pay child support to her spouse.  She is currently trying to work on her budget so it does not affect her too much.  She is going to start a new job soon and looks forward to that.  Reports that she did well on the interviews although she did have anxiety at the first one and even though she took the propranolol that day, it did not really help.  However the second interview went really well and likely the propranolol did help that day.  Patient currently denies any significant sadness or anxiety symptoms.  Reports  sleep continues to be restless.  Goes to bed at around 10 to 11 PM, wakes up every day around 3 AM and most days she is unable to fall back asleep.  She does take the doxepin as needed which helps.  She however reports she is afraid to take a higher dosage since she takes it too late and she wakes up groggy.  She is aware that she needs to start working on her sleep hygiene.  Will let writer know about medication changes once she works on her sleep hygiene for the next few weeks.  Patient currently denies any side effects to her medications like Wellbutrin, BuSpar.  Continues to struggle with attention and focus, has ADHD testing coming up in November.  Looks forward to that.  Denies any suicidality, homicidality or perceptual disturbances.  Patient denies any other concerns today.  Visit Diagnosis:    ICD-10-CM   1. MDD (major depressive disorder), recurrent, in full remission ([place])  F33.42     2. GAD (generalized anxiety disorder)  F41.1     3. Insomnia due to mental condition  F51.05    mood    4. Attention and concentration deficit  R41.840       Past Psychiatric History: Reviewed past psychiatric history from progress note on 11/05/2018.  Past Medical History:  Past Medical History:  Diagnosis Date   Depression    treated   PCOS (polycystic ovarian syndrome) 2008   Plantar fasciitis     Past Surgical History:  Procedure Laterality Date  CHOLECYSTECTOMY      Family Psychiatric History: Reviewed family psychiatric history from progress note on 11/05/2018.  Family History:  Family History  Problem Relation Age of Onset   High blood pressure Mother    Depression Father    Liver disease Father    Alcoholism Father    Alcohol abuse Father    Diabetes Paternal Grandmother    Bipolar disorder Maternal Aunt     Social History: Reviewed social history from progress note on 11/05/2018. Social History   Socioeconomic History   Marital status: Legally Separated     Spouse name: [name]   Number of children: 2   Years of education: Not on file   Highest education level: Bachelor's degree (e.g., BA, AB, BS)  Occupational History   Occupation: RN  Tobacco Use   Smoking status: Never   Smokeless tobacco: Never  Vaping Use   Vaping Use: Never used  Substance and Sexual Activity   Alcohol use: Yes    Alcohol/week: 1.0 standard drink of alcohol    Types: 1 Glasses of wine per week    Comment: Occasional once a week.   Drug use: No   Sexual activity: Yes    Partners: Male    Birth control/protection: None, I.U.D.  Other Topics Concern   Not on file  Social History Narrative   Not on file   Social Determinants of Health   Financial Resource Strain: Low Risk  (11/05/2018)   Overall Financial Resource Strain (CARDIA)    Difficulty of Paying Living Expenses: Not hard at all  Food Insecurity: No Food Insecurity (11/05/2018)   Hunger Vital Sign    Worried About Running Out of Food in the Last Year: Never true    Ran Out of Food in the Last Year: Never true  Transportation Needs: No Transportation Needs (11/05/2018)   PRAPARE - Hydrologist (Medical): No    Lack of Transportation (Non-Medical): No  Physical Activity: Inactive (11/05/2018)   Exercise Vital Sign    Days of Exercise per Week: 0 days    Minutes of Exercise per Session: 0 min  Stress: Stress Concern Present (11/05/2018)   [place]    Feeling of Stress : Rather much  Social Connections: Unknown (11/05/2018)   Social Connection and Isolation Panel [NHANES]    Frequency of Communication with Friends and Family: Not on file    Frequency of Social Gatherings with Friends and Family: Not on file    Attends Religious Services: More than 4 times per year    Active Member of Clubs or Organizations: No    Attends Archivist Meetings: Never    Marital Status: Married    Allergies: No  Known Allergies  Metabolic Disorder Labs: Lab Results  Component Value Date   HGBA1C 4.9 04/02/2022   No results found for: "PROLACTIN" Lab Results  Component Value Date   CHOL 143 04/02/2022   TRIG 68 04/02/2022   HDL 54 04/02/2022   CHOLHDL 2.6 04/02/2022   [place] 75 04/02/2022   LDLCALC 76 03/07/2021   Lab Results  Component Value Date   TSH 2.280 03/07/2021   TSH 1.470 01/09/2018    Therapeutic Level Labs: No results found for: "LITHIUM" No results found for: "VALPROATE" No results found for: "CBMZ"  Current Medications: Current Outpatient Medications  Medication Sig Dispense Refill   buPROPion (WELLBUTRIN XL) 300 MG 24 hr tablet TAKE 1 TABLET (300  MG TOTAL) BY MOUTH DAILY. 90 tablet 1   busPIRone (BUSPAR) 15 MG tablet Take 1 tablet (15 mg total) by mouth 2 (two) times daily. 180 tablet 1   Cholecalciferol (VITAMIN D) 125 MCG (5000 UT) CAPS Take 1 capsule by mouth daily. 30 capsule 0   cyclobenzaprine (FLEXERIL) 5 MG tablet Take 1 tablet (5 mg total) by mouth nightly as needed 30 tablet 1   doxepin (SINEQUAN) 10 MG capsule TAKE 1 TO 2 CAPSULES BY MOUTH AT BEDTIME AS NEEDED FOR SLEEP 30 capsule 1   ELDERBERRY PO Take by mouth.     levocetirizine (XYZAL) 5 MG tablet Take 5 mg by mouth every evening.     levonorgestrel (MIRENA) 20 MCG/24HR IUD 1 each by Intrauterine route once.     Multiple Vitamin (MULTIVITAMIN) tablet Take 1 tablet by mouth daily.     Probiotic Product (PROBIOTIC-10 PO) Take by mouth.     propranolol (INDERAL) 20 MG tablet Take 1 tablet (20 mg total) by mouth 2 (two) times daily as needed 60 tablet 1   spironolactone (ALDACTONE) 100 MG tablet TAKE 1 TABLET BY MOUTH ONCE DAILY 30 tablet 1   tirzepatide ([name]) 12.5 MG/0.5ML Pen Inject 12.5 mg into the skin once a week. 6 mL 0   No current facility-administered medications for this visit.     Musculoskeletal: Strength & Muscle Tone:  UTA Gait & Station:  Seated Patient leans:  N/A  Psychiatric Specialty Exam: Review of Systems  Psychiatric/Behavioral:  Positive for sleep disturbance. The patient is nervous/anxious.   All other systems reviewed and are negative.   There were no vitals taken for this visit.There is no height or weight on file to calculate BMI.  General Appearance: Casual  Eye Contact:  Fair  Speech:  Clear and Coherent  Volume:  Normal  Mood:  Anxious coping well  Affect:  Congruent  Thought Process:  Goal Directed and Descriptions of Associations: Intact  Orientation:  Full (Time, Place, and Person)  Thought Content: Logical   Suicidal Thoughts:  No  Homicidal Thoughts:  No  Memory:  Immediate;   Fair Recent;   Fair Remote;   Fair  Judgement:  Fair  Insight:  Fair  Psychomotor Activity:  Normal  Concentration:  Concentration: Fair and Attention Span: Fair  Recall:  [employer] of Knowledge: Fair  Language: Fair  Akathisia:  No  Handed:  Right  AIMS (if indicated): not done  Assets:  Communication Skills Desire for [place] Talents/Skills Transportation  ADL's:  Intact  Cognition: WNL  Sleep:   Restless   Screenings: GAD-7    Flowsheet Row Video Visit from 12/07/2020 in [facility]  Total GAD-7 Score 1      PHQ2-9    [place] Visit from 01/04/2022 in [facility] Office Visit from 09/14/2021 in [facility] Video Visit from 03/07/2021 in [facility] Video Visit from 12/07/2020 in [facility] Video Visit from 10/19/2020 in [facility]  PHQ-2 Total Score 2 0 0 1 1  PHQ-9 Total Score 10 -- -- -- --      Flowsheet Row Video Visit from 03/07/2021 in [facility] Video Visit from 10/19/2020 in [facility] No Risk Low Risk        Assessment and  Plan: MAKYNLIE ROSSINI is a 36 year old Caucasian female, divorced, employed, has a history of  depression, anxiety was evaluated by telemedicine today.  Patient is currently struggling with attention and focus deficit, sleep, discussed plan as noted below.  Plan MDD in remission Wellbutrin XL 300 mg p.o. daily L-methylfolate as prescribed  GAD-improving BuSpar 15 mg p.o. twice daily Continue CBT with Ms. Freddi Che. Hydroxyzine 25 mg daily as needed for severe anxiety attacks Propranolol 20 mg p.o. twice daily as needed.  Insomnia-unstable Patient to work on sleep hygiene techniques.  Discussed switching off TV, electronic devices at least a couple of hours prior to bedtime and to wind down, use relaxation techniques, read a book and to set bedtime and a wake-up time and to stick with it. Doxepin 10 to 20 mg p.o. nightly as needed We will consider changing sleep medication at her next visit if needed.  Attention and concentration deficit-patient awaiting neuropsychological testing.  Follow-up in clinic in 3 months or sooner if needed.   Consent: Patient/Guardian gives verbal consent for treatment and assignment of benefits for services provided during this visit. Patient/Guardian expressed understanding and agreed to proceed.   This note was generated in part or whole with voice recognition software. Voice recognition is usually quite accurate but there are transcription errors that can and very often do occur. I apologize for any typographical errors that were not detected and corrected.      Ursula Alert, MD 05/09/2022, 8:57 AM

## 2022-05-29 ENCOUNTER — Other Ambulatory Visit: Payer: Self-pay

## 2022-05-29 ENCOUNTER — Encounter (INDEPENDENT_AMBULATORY_CARE_PROVIDER_SITE_OTHER): Payer: Self-pay | Admitting: Adult Health

## 2022-05-29 ENCOUNTER — Ambulatory Visit (INDEPENDENT_AMBULATORY_CARE_PROVIDER_SITE_OTHER): Payer: No Typology Code available for payment source | Admitting: Adult Health

## 2022-05-29 VITALS — BP 126/90 | HR 78 | Temp 98.0°F | Ht 70.0 in | Wt 223.0 lb

## 2022-05-29 DIAGNOSIS — E669 Obesity, unspecified: Secondary | ICD-10-CM

## 2022-05-29 DIAGNOSIS — E88819 Insulin resistance, unspecified: Secondary | ICD-10-CM | POA: Diagnosis not present

## 2022-05-29 DIAGNOSIS — Z6832 Body mass index (BMI) 32.0-32.9, adult: Secondary | ICD-10-CM

## 2022-05-29 MED ORDER — TIRZEPATIDE 12.5 MG/0.5ML ~~LOC~~ SOAJ
12.5000 mg | SUBCUTANEOUS | 0 refills | Status: DC
  Filled 2022-05-29: qty 2, 28d supply, fill #0

## 2022-05-30 ENCOUNTER — Other Ambulatory Visit: Payer: Self-pay

## 2022-06-01 ENCOUNTER — Other Ambulatory Visit: Payer: Self-pay

## 2022-06-05 NOTE — Progress Notes (Unsigned)
Chief Complaint:   OBESITY Rhonda [name] is here to discuss her progress with her obesity treatment plan along with follow-up of her obesity related diagnoses. Rhonda [name] is on the Category 2 Plan and states she is following her eating plan approximately 50% of the time. Rhonda [name] states she is not exercising.   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   Subjective:   1. Insulin resistance ***  Assessment/Plan:   1. Insulin resistance *** Refill - tirzepatide ([name]) 12.5 MG/0.5ML Pen; Inject 12.5 mg into the skin once a week.  Dispense: 6 mL; Refill: 0  2. Obesity, current BMI 32.1 1410 with Category 2.  Rhonda [name] is currently in the action stage of change. As such, her goal is to continue with weight loss efforts. She has agreed to the Category 2 Plan.   Exercise goals:  One week after starting new position , start going to the gym at least 2 x per week.   Behavioral modification strategies: increasing lean protein intake, decreasing simple carbohydrates, meal planning and cooking strategies, keeping healthy foods in the home, and planning for success.  Rhonda [name] has agreed to follow-up with our clinic in 4 weeks. She was informed of the importance of frequent follow-up visits to maximize her success with intensive lifestyle modifications for her multiple health conditions.   Objective:   Blood pressure (!) 126/90, pulse 78, temperature 98 F (36.7 C), height '5\' 10"'$  (1.778 m), weight 223 lb (101.2 kg), SpO2 100 %. Body mass index is 32 kg/m.  General: Cooperative, alert, well developed, in no acute distress. HEENT: Conjunctivae and lids unremarkable. Cardiovascular: Regular rhythm.  Lungs: Normal work of breathing. Neurologic: No  focal deficits.   Lab Results  Component Value Date   CREATININE 0.91 04/02/2022   BUN 8 04/02/2022   NA 137 04/02/2022   K 4.3 04/02/2022   CL 103 04/02/2022   CO2 21 04/02/2022   Lab Results  Component Value Date   ALT 23 04/02/2022   AST 21 04/02/2022   ALKPHOS 54 04/02/2022   BILITOT 0.4 04/02/2022   Lab Results  Component Value Date   HGBA1C 4.9 04/02/2022   HGBA1C 5.0 08/23/2021   HGBA1C 5.0 03/07/2021   HGBA1C 5.0 07/19/2020   HGBA1C 5.2 01/25/2020   Lab Results  Component Value Date   INSULIN 17.1 04/02/2022   INSULIN 7.0 08/23/2021   INSULIN 20.8 03/07/2021   INSULIN 34.0 (H) 07/19/2020   INSULIN 9.9 01/25/2020   Lab Results  Component Value Date   TSH 2.280 03/07/2021   Lab Results  Component Value Date   CHOL 143 04/02/2022   HDL 54 04/02/2022   LDLCALC 75 04/02/2022   TRIG 68 04/02/2022   CHOLHDL 2.6 04/02/2022   Lab Results  Component Value Date   VD25OH 49.5 04/02/2022   VD25OH 70.0 03/07/2021   VD25OH 44.3 07/19/2020   Lab Results  Component Value Date   WBC 4.7 01/09/2018   HGB 14.5 01/09/2018   HCT 42.1 01/09/2018   MCV 89 01/09/2018   PLT 195 09/20/2016   No results found for: "IRON", "TIBC", "FERRITIN"  Attestation Statements:   Reviewed by clinician on day of visit: allergies, medications, problem list, medical history, surgical history, family history, social  history, and previous encounter notes.  I, Davy Pique, RMA, am acting as Location manager for Mina Marble, NP.  I have reviewed the above documentation for accuracy and completeness, and I agree with the above. -  ***

## 2022-06-11 ENCOUNTER — Other Ambulatory Visit: Payer: Self-pay

## 2022-06-11 MED ORDER — SPIRONOLACTONE 100 MG PO TABS
ORAL_TABLET | ORAL | 3 refills | Status: DC
  Filled 2022-06-11 – 2022-07-03 (×2): qty 90, 90d supply, fill #0
  Filled 2022-12-09: qty 90, 90d supply, fill #1
  Filled 2023-04-05: qty 90, 90d supply, fill #2

## 2022-06-20 ENCOUNTER — Other Ambulatory Visit: Payer: Self-pay

## 2022-06-20 MED ORDER — TRIAMCINOLONE ACETONIDE 0.1 % EX CREA
TOPICAL_CREAM | CUTANEOUS | 1 refills | Status: DC
  Filled 2022-06-20: qty 45, 20d supply, fill #0

## 2022-07-01 NOTE — Progress Notes (Signed)
TeleHealth Visit:  This visit was completed with telemedicine (audio/video) technology. [name] has verbally consented to this TeleHealth visit. The patient is located at home, the provider is located at home. The participants in this visit include the listed provider and patient. The visit was conducted today via MyChart video.  OBESITY Rhonda [name] is here to discuss her progress with her obesity treatment plan along with follow-up of her obesity related diagnoses.   Today's visit was # 59 Starting weight: 273 lbs Starting date: 01/09/2018 Weight at last in office visit: 223 lbs on 05/29/22 Total weight loss: 50 lbs at last in office visit on 05/29/22. Today's reported weight:  No weight reported.  Nutrition Plan: the Category 2 Plan- 50% adherence  Current exercise:  has gone to gym one time since last office visit.  Interim History: Rhonda [name] has not weighed since her last office visit.  She has been struggling to stick to the plan.  She often skips breakfast and the meal she cooks at dinner have not been as healthy as they should be.  She has 2 small children that are picky with their eating.  Also as a single mom she struggles to find time for meal prep. 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  Her new office is in [place]. Finding the time to exercise has been difficult.  Her new plan is to exercise on the days that her children are with their father.  She feels she will be able to exercise 2 times per week at the gym.  Assessment/Plan:  1. Insulin Resistance Fasting insulin elevated at 17.1 on 04/02/2022. Appetite well controlled with [name] 12.5 mg weekly.  She denies side effects.  This will be her 12th refill that I send in today.  After that she likely will not have coverage for [facility] since she does not have type 2 diabetes. Lab Results  Component Value  Date   HGBA1C 4.9 04/02/2022   Lab Results  Component Value Date   INSULIN 17.1 04/02/2022   INSULIN 7.0 08/23/2021   INSULIN 20.8 03/07/2021   INSULIN 34.0 (H) 07/19/2020   INSULIN 9.9 01/25/2020    Plan Refill [name] 12.5 mg weekly. If no coverage, send prescription for Zepbound 12.5 mg weekly. If no coverage for that, send prescription for Wegovy 2.4 mg weekly.   2. Vitamin D Deficiency Vitamin D is nearly at goal of 50-last vitamin D was 49.5 on 04/02/2022. She is on OTC vitamin D 5000 IU daily. Lab Results  Component Value Date   VD25OH 49.5 04/02/2022   VD25OH 70.0 03/07/2021   VD25OH 44.3 07/19/2020    Plan: Continue OTC vitamin D 5000 IU daily.   3. Obesity: Current BMI 32.0 Fe is currently in the action stage of change. As such, her goal is to continue with weight loss efforts.  She has agreed to the Category 2 Plan.   She will try to plan healthier dinner meals. She will take food for breakfast to work. Recipe for egg bites sent via MyChart.  Exercise goals: She will exercise at the gym twice weekly.  Behavioral modification strategies: no skipping meals, meal planning and cooking strategies, and planning for success.  [name] has agreed to follow-up with our clinic in 4 weeks.   No orders of the defined types were placed in this encounter.   Medications Discontinued During This Encounter  Medication  Reason   tirzepatide [facility]) 12.5 MG/0.5ML Pen Reorder     Meds ordered this encounter  Medications   tirzepatide ([name]) 12.5 MG/0.5ML Pen    Sig: Inject 12.5 mg into the skin once a week.    Dispense:  2 mL    Refill:  0    Order Specific Question:   Supervising Provider    Answer:   Glennis Brink [[id number]]      Objective:   VITALS: Per patient if applicable, see vitals. GENERAL: Alert and in no acute distress. CARDIOPULMONARY: No increased WOB. Speaking in clear sentences.  PSYCH: Pleasant and cooperative. Speech normal rate and  rhythm. Affect is appropriate. Insight and judgement are appropriate. Attention is focused, linear, and appropriate.  NEURO: Oriented as arrived to appointment on time with no prompting.   Lab Results  Component Value Date   CREATININE 0.91 04/02/2022   BUN 8 04/02/2022   NA 137 04/02/2022   K 4.3 04/02/2022   CL 103 04/02/2022   CO2 21 04/02/2022   Lab Results  Component Value Date   ALT 23 04/02/2022   AST 21 04/02/2022   ALKPHOS 54 04/02/2022   BILITOT 0.4 04/02/2022   Lab Results  Component Value Date   HGBA1C 4.9 04/02/2022   HGBA1C 5.0 08/23/2021   HGBA1C 5.0 03/07/2021   HGBA1C 5.0 07/19/2020   HGBA1C 5.2 01/25/2020   Lab Results  Component Value Date   INSULIN 17.1 04/02/2022   INSULIN 7.0 08/23/2021   INSULIN 20.8 03/07/2021   INSULIN 34.0 (H) 07/19/2020   INSULIN 9.9 01/25/2020   Lab Results  Component Value Date   TSH 2.280 03/07/2021   Lab Results  Component Value Date   CHOL 143 04/02/2022   HDL 54 04/02/2022   LDLCALC 75 04/02/2022   TRIG 68 04/02/2022   CHOLHDL 2.6 04/02/2022   Lab Results  Component Value Date   WBC 4.7 01/09/2018   HGB 14.5 01/09/2018   HCT 42.1 01/09/2018   MCV 89 01/09/2018   PLT 195 09/20/2016   No results found for: "IRON", "TIBC", "FERRITIN" Lab Results  Component Value Date   VD25OH 49.5 04/02/2022   VD25OH 70.0 03/07/2021   VD25OH 44.3 07/19/2020    Attestation Statements:   Reviewed by clinician on day of visit: allergies, medications, problem list, medical history, surgical history, family history, social history, and previous encounter notes.

## 2022-07-02 ENCOUNTER — Encounter (INDEPENDENT_AMBULATORY_CARE_PROVIDER_SITE_OTHER): Payer: Self-pay | Admitting: Family Medicine

## 2022-07-02 ENCOUNTER — Other Ambulatory Visit: Payer: Self-pay

## 2022-07-02 ENCOUNTER — Telehealth (INDEPENDENT_AMBULATORY_CARE_PROVIDER_SITE_OTHER): Payer: No Typology Code available for payment source | Admitting: Family Medicine

## 2022-07-02 DIAGNOSIS — E669 Obesity, unspecified: Secondary | ICD-10-CM | POA: Diagnosis not present

## 2022-07-02 DIAGNOSIS — Z6832 Body mass index (BMI) 32.0-32.9, adult: Secondary | ICD-10-CM

## 2022-07-02 DIAGNOSIS — E88819 Insulin resistance, unspecified: Secondary | ICD-10-CM | POA: Diagnosis not present

## 2022-07-02 DIAGNOSIS — Z6839 Body mass index (BMI) 39.0-39.9, adult: Secondary | ICD-10-CM

## 2022-07-02 DIAGNOSIS — E559 Vitamin D deficiency, unspecified: Secondary | ICD-10-CM | POA: Diagnosis not present

## 2022-07-02 MED ORDER — TIRZEPATIDE 12.5 MG/0.5ML ~~LOC~~ SOAJ
12.5000 mg | SUBCUTANEOUS | 0 refills | Status: DC
  Filled 2022-07-02: qty 2, 28d supply, fill #0

## 2022-07-03 ENCOUNTER — Other Ambulatory Visit: Payer: Self-pay

## 2022-07-11 ENCOUNTER — Other Ambulatory Visit: Payer: Self-pay

## 2022-07-19 ENCOUNTER — Encounter: Payer: No Typology Code available for payment source | Attending: Psychology | Admitting: Psychology

## 2022-07-19 ENCOUNTER — Encounter: Payer: Self-pay | Admitting: Psychology

## 2022-07-19 DIAGNOSIS — F330 Major depressive disorder, recurrent, mild: Secondary | ICD-10-CM | POA: Insufficient documentation

## 2022-07-19 DIAGNOSIS — F411 Generalized anxiety disorder: Secondary | ICD-10-CM | POA: Diagnosis not present

## 2022-07-19 DIAGNOSIS — F5105 Insomnia due to other mental disorder: Secondary | ICD-10-CM | POA: Diagnosis not present

## 2022-07-19 DIAGNOSIS — R41840 Attention and concentration deficit: Secondary | ICD-10-CM | POA: Insufficient documentation

## 2022-07-19 NOTE — Progress Notes (Signed)
Neuropsychological Consultation   Patient:   Rhonda [name]   DOB:   1986/03/05  MR Number:  [id number]  Location:  [facility] [place], [place] [id number] [address] [place] [address] Dept: [phone]           Date of Service:   07/19/2022  Start Time:   8 AM End Time:   10 AM  Today's visit was an in person visit that was conducted in my outpatient clinic office.  The patient myself were present.  1 hour and 15 minutes was spent in face-to-face clinical interview and the other 45 minutes was spent with record review, report writing and setting up testing protocols.  Provider/Observer:  [name] Skill, Psy.D.       Clinical Neuropsychologist       Billing Code/Service: 96116/96121  Chief Complaint:    Rhonda [name] is a 36 year old female referred for neuropsychological evaluation by her treating psychiatrist Ursula Alert, MD due to ongoing difficulties with attention and concentration in conjunction with a history of anxiety and depressive symptomatology as well.  The patient describes ongoing difficulties with concentrating, impulsiveness and forgetfulness.  The patient reports that she has had attention and concentration issues at least since high school which is when she first started noticing it herself.  The patient reports she had even more difficulties with these issues during college.  The patient reports that she also started having more issues and difficulties with anxiety and depressive types of symptoms in high school with significant depressive event when she was 62 or 36 years old.  The patient's concern around these attentional issues prompted a referral for neuropsychological evaluation to provide more in-depth diagnostic assessment to aid in treatment planning and psychiatric management going forward.  Reason for Service:  Rhonda [name] is a 36 year old  female referred for neuropsychological evaluation by her treating psychiatrist Ursula Alert, MD due to ongoing difficulties with attention and concentration in conjunction with a history of anxiety and depressive symptomatology as well.  The patient describes ongoing difficulties with concentrating, impulsiveness and forgetfulness.  The patient reports that she has had attention and concentration issues at least since high school which is when she first started noticing it herself.  The patient reports she had even more difficulties with these issues during college.  The patient reports that she also started having more issues and difficulties with anxiety and depressive types of symptoms in high school with significant depressive event when she was 62 or 36 years old.  The patient's concern around these attentional issues prompted a referral for neuropsychological evaluation to provide more in-depth diagnostic assessment to aid in treatment planning and psychiatric management going forward.  The patient reports that overall she is doing fairly well medically and has recently lost a significant amount of weight between 50 and 60 pounds after taking [name].  The patient had been diagnosed with insulin resistance and has had previous diagnosis of polycystic ovarian syndrome.  The patient reports that she feels like she has had attentional issues as long as she can remember and notes that they really started being a problem for her in high school and even more difficulty with attention and concentration in college.  The patient reports that she has difficulty thinking and focusing on 1 task and completing that task..  The patient has a significant past psychiatric history that goes back to high school.  The patient had  a major depressive event when she was [age] years old and had a brief inpatient hospitalization after a suicide attempt taking Tylenol that she describes more of a cry for help.  The patient  suffered no lasting medical complications and no liver injury or dysfunction.  The patient reports that she has participated in individual counseling/therapy since her 52s.  Patient denies any current suicidal ideation and has had no difficulties with suicide attempt or suicidal ideation since.  The patient's current medication regimen includes Wellbutrin, BuSpar and doxepin which she feels like is an effective and helpful strategy.  The patient reports that she has had some significant issues with psychosocial stressors particularly around the ending of her marriage.  The patient reports that there was very poor communication for the last 2 years of their marriage and after the patient informed her now ex-husband that she wanted a divorce there were more challenges and he started putting up more demands etc.  There are issues with coparenting of their children and ongoing issues around competitive responses.  The patient reports that there are times when she just feels like she is in sensory overload from her kids and has difficulty keeping up with appointments and other responsibilities.  The patient reports that while she has been improving her sleep pattern and is improving with some medication interventions by Dr. Shea Evans she continues to have a disturbance of sleep.  The patient reports that she knows she may go to bed too late but often worries about taking the medication she is prescribed for fear that she will still be groggy in the morning.  The patient reports that she will also wake up in the middle of the night and while she goes back to sleep most of the time other times she has difficulty going back to sleep.  The patient reports that there are times in which she overeats but she has successfully lost a considerable amount of weight and has been actively working on her overall health status.  Current stressors include recent divorce and challenges around this divorce including coparenting and  some of the financial stressors that this divorce is placed on her including the patient being responsible for child support even though she and her husband have joint custody.  Behavioral Observation: KAURI GARSON  presents as a 36 y.o.-year-old Left handed Caucasian Female who appeared her stated age. her dress was Appropriate and she was Well Groomed and her manners were Appropriate to the situation.  her participation was indicative of Appropriate behaviors.  There were not physical disabilities noted.  she displayed an appropriate level of cooperation and motivation.     Interactions:    Active Appropriate  Attention:   within normal limits and attention span and concentration were age appropriate  Memory:   within normal limits; recent and remote memory intact  Visuo-spatial:  not examined  Speech (Volume):  normal  Speech:   normal; normal  Thought Process:  Coherent and Relevant  Though Content:  WNL; not suicidal and not homicidal  Orientation:   person, place, time/date, and situation  Judgment:   Good  Planning:   Fair  Affect:    Appropriate  Mood:    Dysphoric  Insight:   Good  Intelligence:   high  Marital Status/Living: The patient was born and raised in [place] along with 1 younger sibling.  Other than the patient being delivered C-section there were no significant medical issues with pregnancy or birth noted.  The patient  is divorced which was a recent development and has 2 children.  She has an 67-year-old son and a 62-year-old daughter.  Her son has had some difficulty coping with the divorce and is currently in counseling around these adjustment issues.  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.  She has been in this job for 7 years and has never been terminated from her job.  The patient's hobbies and interests include reading.  Substance Use:  No concerns of substance abuse are reported.   The patient has very minimal alcohol use having between 1 and 2 drinks per week.  No other substance use is noted.  Education:   The patient completed her masters degree in nursing with a 3.9 GPA attending [employer], [employer] and [employer].  She always did well in [place] and science classes and had some relative weakness in math.  No learning disabilities or other academic development issues are suggested.  Extracurricular activities included playing basketball and volleyball.  Medical History:   Past Medical History:  Diagnosis Date   Depression    treated   PCOS (polycystic ovarian syndrome) 2008   Plantar fasciitis          Patient Active Problem List   Diagnosis Date Noted   Elevated serum creatinine 04/12/2022   Health care maintenance 03/08/2022   Attention and concentration deficit 01/04/2022   Transaminitis 08/29/2021   Diarrhea 07/04/2021   Elevated liver enzymes 08/02/2020   MDD (major depressive disorder), recurrent, in full remission ([facility]) 12/31/2019   Numbness and tingling in left hand 10/15/2019   MDD (major depressive disorder), recurrent, in partial remission ([facility]) 10/01/2019   Cubital tunnel syndrome, left 09/07/2019   Rotator cuff tendinitis, left 09/07/2019   Insomnia due to mental condition 06/23/2019   MDD (major depressive disorder), recurrent episode, mild ([facility]) 05/04/2019   GAD (generalized anxiety disorder) 05/04/2019   Vitamin D deficiency 10/28/2018   Insulin resistance 10/28/2018   Class 2 severe obesity with serious comorbidity and body mass index (BMI) of 39.0 to 39.9 in adult [facility]) 10/28/2018   Gestational hypertension 09/18/2016   Pregnancy 05/07/2016   First trimester screening 03/19/2016   Family history of congenital heart defect    Spontaneous abortion in second trimester 10/24/2012   Depression 12/05/2011   History of kidney stones 12/05/2011   PCOS (polycystic ovarian syndrome) 12/05/2011        Abuse/Trauma History: The  patient did not report any specific acute abuse or trauma type of issues although there is been a lot of difficulties with her divorce and stressors around that.  Psychiatric History:  Patient does have a past history of depression and anxiety symptoms starting at least when she was in high school around age [age].  The patient also describes issues with attention and concentration.  Family Med/Psych History:  Family History  Problem Relation Age of Onset   High blood pressure Mother    Depression Father    Liver disease Father    Alcoholism Father    Alcohol abuse Father    Diabetes Paternal Grandmother    Bipolar disorder Maternal Aunt     Risk of Suicide/Violence: virtually non-existent patient denies any suicidal or homicidal ideation.  Impression/DX:  WYNONA DUHAMEL is a 36 year old female referred for neuropsychological evaluation by her treating psychiatrist Ursula Alert, MD due to ongoing difficulties with attention and concentration in conjunction with a history of anxiety and depressive symptomatology  as well.  The patient describes ongoing difficulties with concentrating, impulsiveness and forgetfulness.  The patient reports that she has had attention and concentration issues at least since high school which is when she first started noticing it herself.  The patient reports she had even more difficulties with these issues during college.  The patient reports that she also started having more issues and difficulties with anxiety and depressive types of symptoms in high school with significant depressive event when she was 20 or 36 years old.  The patient's concern around these attentional issues prompted a referral for neuropsychological evaluation to provide more in-depth diagnostic assessment to aid in treatment planning and psychiatric management going forward.  Disposition/Plan:  We have set the patient up for formal neuropsychological testing and she will complete the  comprehensive attention battery and the CAB CPT measures.  Once that is completed a formal neuropsychological report will be produced and made available in her EMR as well as made available to her referring physician.  I will also set up a time to sit down with the patient and go over individually the results and any diagnostic and treatment recommendations that are generated from this evaluation.  Diagnosis:    Attention and concentration deficit  GAD (generalized anxiety disorder)  MDD (major depressive disorder), recurrent episode, mild (HCC)  Insomnia due to mental condition         Electronically Signed   _______________________ [name] Skill, Psy.D. Clinical Neuropsychologist

## 2022-07-30 ENCOUNTER — Encounter (INDEPENDENT_AMBULATORY_CARE_PROVIDER_SITE_OTHER): Payer: Self-pay | Admitting: Physician Assistant

## 2022-07-30 ENCOUNTER — Ambulatory Visit (INDEPENDENT_AMBULATORY_CARE_PROVIDER_SITE_OTHER): Payer: No Typology Code available for payment source | Admitting: Physician Assistant

## 2022-07-30 ENCOUNTER — Other Ambulatory Visit (HOSPITAL_COMMUNITY): Payer: Self-pay

## 2022-07-30 VITALS — BP 125/86 | HR 65 | Temp 98.1°F | Ht 70.0 in | Wt 223.0 lb

## 2022-07-30 DIAGNOSIS — E559 Vitamin D deficiency, unspecified: Secondary | ICD-10-CM

## 2022-07-30 DIAGNOSIS — E88819 Insulin resistance, unspecified: Secondary | ICD-10-CM

## 2022-07-30 DIAGNOSIS — Z6832 Body mass index (BMI) 32.0-32.9, adult: Secondary | ICD-10-CM

## 2022-07-30 DIAGNOSIS — E669 Obesity, unspecified: Secondary | ICD-10-CM | POA: Diagnosis not present

## 2022-07-30 MED ORDER — TIRZEPATIDE 12.5 MG/0.5ML ~~LOC~~ SOAJ
12.5000 mg | SUBCUTANEOUS | 0 refills | Status: DC
  Filled 2022-07-30 – 2022-08-21 (×2): qty 2, 28d supply, fill #0

## 2022-08-06 ENCOUNTER — Encounter: Payer: Self-pay | Admitting: Psychiatry

## 2022-08-06 ENCOUNTER — Other Ambulatory Visit: Payer: Self-pay

## 2022-08-06 ENCOUNTER — Telehealth (INDEPENDENT_AMBULATORY_CARE_PROVIDER_SITE_OTHER): Payer: No Typology Code available for payment source | Admitting: Psychiatry

## 2022-08-06 DIAGNOSIS — F3342 Major depressive disorder, recurrent, in full remission: Secondary | ICD-10-CM

## 2022-08-06 DIAGNOSIS — F5105 Insomnia due to other mental disorder: Secondary | ICD-10-CM

## 2022-08-06 DIAGNOSIS — F411 Generalized anxiety disorder: Secondary | ICD-10-CM | POA: Diagnosis not present

## 2022-08-06 DIAGNOSIS — R41840 Attention and concentration deficit: Secondary | ICD-10-CM | POA: Diagnosis not present

## 2022-08-06 NOTE — Progress Notes (Signed)
Virtual Visit via Video Note  I connected with Rhonda [name] on 08/06/22 at  2:00 PM EST by a video enabled telemedicine application and verified that I am speaking with the correct person using two identifiers.  Location Provider Location : [facility] Patient Location : Home  Participants: Patient , Provider   I discussed the limitations of evaluation and management by telemedicine and the availability of in person appointments. The patient expressed understanding and agreed to proceed.    I discussed the assessment and treatment plan with the patient. The patient was provided an opportunity to ask questions and all were answered. The patient agreed with the plan and demonstrated an understanding of the instructions.   The patient was advised to call back or seek an in-person evaluation if the symptoms worsen or if the condition fails to improve as anticipated.   BH MD OP Progress Note  08/07/2022 9:32 AM Rhonda [name]  MRN:  [id number]  Chief Complaint:  Chief Complaint  Patient presents with   Follow-up   Anxiety   Medication Refill   Depression   HPI: Rhonda [name] is a 36 year old Caucasian female, divorced, employed, lives in [place] ,has a history of depression, anxiety was evaluated by telemedicine today.  Patient today reports she is currently doing well.  Reports she is enjoying her new job.  It is a remote job and has the flexibility to work from home.  That helps.  Had a good Thanksgiving holiday.  Reports overall mood symptoms are stable.  Denies any significant anxiety or depression symptoms.  Patient denies any suicidality, homicidality or perceptual disturbances.  Reports she continues to struggle with attention and focus and had her consultation for neuropsychological testing.  She has upcoming testing coming up.  Looks forward to that.  Patient denies any side effects to her medications.  Patient denies any other concerns today.  Visit  Diagnosis:    ICD-10-CM   1. MDD (major depressive disorder), recurrent, in full remission (HCC)  F33.42     2. GAD (generalized anxiety disorder)  F41.1     3. Insomnia due to mental condition  F51.05    mood    4. Attention and concentration deficit  R41.840       Past Psychiatric History: Reviewed past psychiatric history from progress note on 11/05/2018.  Past Medical History:  Past Medical History:  Diagnosis Date   Depression    treated   PCOS (polycystic ovarian syndrome) 2008   Plantar fasciitis     Past Surgical History:  Procedure Laterality Date   CHOLECYSTECTOMY      Family Psychiatric History: Reviewed family psychiatric history from progress note on 11/05/2018.  Family History:  Family History  Problem Relation Age of Onset   High blood pressure Mother    Depression Father    Liver disease Father    Alcoholism Father    Alcohol abuse Father    Diabetes Paternal Grandmother    Bipolar disorder Maternal Aunt     Social History: Reviewed social history from progress note on 11/05/2018. Social History   Socioeconomic History   Marital status: Legally Separated    Spouse name: Christiane Ha   Number of children: 2   Years of education: Not on file   Highest education level: Bachelor's degree (e.g., BA, AB, BS)  Occupational History   Occupation: RN  Tobacco Use   Smoking status: Never   Smokeless tobacco: Never  Vaping Use   Vaping Use: Never used  Substance and Sexual Activity   Alcohol use: Yes    Alcohol/week: 1.0 standard drink of alcohol    Types: 1 Glasses of wine per week    Comment: Occasional once a week.   Drug use: No   Sexual activity: Yes    Partners: Male    Birth control/protection: None, I.U.D.  Other Topics Concern   Not on file  Social History Narrative   Not on file   Social Determinants of Health   Financial Resource Strain: Low Risk  (11/05/2018)   Overall Financial Resource Strain (CARDIA)    Difficulty of Paying Living  Expenses: Not hard at all  Food Insecurity: No Food Insecurity (11/05/2018)   Hunger Vital Sign    Worried About Running Out of Food in the Last Year: Never true    Ran Out of Food in the Last Year: Never true  Transportation Needs: No Transportation Needs (11/05/2018)   PRAPARE - Administrator, Civil Service (Medical): No    Lack of Transportation (Non-Medical): No  Physical Activity: Inactive (11/05/2018)   Exercise Vital Sign    Days of Exercise per Week: 0 days    Minutes of Exercise per Session: 0 min  Stress: Stress Concern Present (11/05/2018)   [employer] of Occupational Health - Occupational Stress Questionnaire    Feeling of Stress : Rather much  Social Connections: Unknown (11/05/2018)   Social Connection and Isolation Panel [NHANES]    Frequency of Communication with Friends and Family: Not on file    Frequency of Social Gatherings with Friends and Family: Not on file    Attends Religious Services: More than 4 times per year    Active Member of Clubs or Organizations: No    Attends Banker Meetings: Never    Marital Status: Married    Allergies: No Known Allergies  Metabolic Disorder Labs: Lab Results  Component Value Date   HGBA1C 4.9 04/02/2022   No results found for: "PROLACTIN" Lab Results  Component Value Date   CHOL 143 04/02/2022   TRIG 68 04/02/2022   HDL 54 04/02/2022   CHOLHDL 2.6 04/02/2022   LDLCALC 75 04/02/2022   LDLCALC 76 03/07/2021   Lab Results  Component Value Date   TSH 2.280 03/07/2021   TSH 1.470 01/09/2018    Therapeutic Level Labs: No results found for: "LITHIUM" No results found for: "VALPROATE" No results found for: "CBMZ"  Current Medications: Current Outpatient Medications  Medication Sig Dispense Refill   buPROPion (WELLBUTRIN XL) 300 MG 24 hr tablet TAKE 1 TABLET (300 MG TOTAL) BY MOUTH DAILY. 90 tablet 1   busPIRone (BUSPAR) 15 MG tablet Take 1 tablet (15 mg total) by mouth 2 (two) times  daily. 180 tablet 1   Cholecalciferol (VITAMIN D) 125 MCG (5000 UT) CAPS Take 1 capsule by mouth daily. 30 capsule 0   cyclobenzaprine (FLEXERIL) 5 MG tablet Take 1 tablet (5 mg total) by mouth nightly as needed 30 tablet 1   doxepin (SINEQUAN) 10 MG capsule TAKE 1 TO 2 CAPSULES BY MOUTH AT BEDTIME AS NEEDED FOR SLEEP 30 capsule 1   ELDERBERRY PO Take by mouth.     levocetirizine (XYZAL) 5 MG tablet Take 5 mg by mouth every evening.     levonorgestrel (MIRENA) 20 MCG/24HR IUD 1 each by Intrauterine route once.     Multiple Vitamin (MULTIVITAMIN) tablet Take 1 tablet by mouth daily.     Probiotic Product (PROBIOTIC-10 PO) Take by mouth.  propranolol (INDERAL) 20 MG tablet Take 1 tablet (20 mg total) by mouth 2 (two) times daily as needed 60 tablet 1   spironolactone (ALDACTONE) 100 MG tablet Take 1 tablet (100 mg total) by mouth once daily 90 tablet 3   tirzepatide ([name]) 12.5 MG/0.5ML Pen Inject 12.5 mg into the skin once a week. 2 mL 0   triamcinolone cream (KENALOG) 0.1 % Apply twice daily to hands as needed. 45 g 1   No current facility-administered medications for this visit.     Musculoskeletal: Strength & Muscle Tone:  UTA Gait & Station:  Seated Patient leans: N/A  Psychiatric Specialty Exam: Review of Systems  Psychiatric/Behavioral:  Positive for decreased concentration. The patient is nervous/anxious.   All other systems reviewed and are negative.   There were no vitals taken for this visit.There is no height or weight on file to calculate BMI.  General Appearance: Casual  Eye Contact:  Fair  Speech:  Clear and Coherent  Volume:  Normal  Mood:  Anxious  Affect:  Congruent  Thought Process:  Goal Directed and Descriptions of Associations: Intact  Orientation:  Full (Time, Place, and Person)  Thought Content: Logical   Suicidal Thoughts:  No  Homicidal Thoughts:  No  Memory:  Immediate;   Fair Recent;   Fair Remote;   Fair  Judgement:  Fair  Insight:  Fair   Psychomotor Activity:  Normal  Concentration:  Concentration: Fair and Attention Span: Fair  Recall:  [employer] of Knowledge: Fair  Language: Fair  Akathisia:  No  Handed:  Right  AIMS (if indicated): not done  Assets:  Communication Skills Desire for Improvement Housing Social Support  ADL's:  Intact  Cognition: WNL  Sleep:  Fair   Screenings: GAD-7    Flowsheet Row Video Visit from 12/07/2020 in [facility]  Total GAD-7 Score 1      PHQ2-9    Flowsheet Row Video Visit from 08/06/2022 in [facility] Office Visit from 01/04/2022 in [facility] Office Visit from 09/14/2021 in [facility] Video Visit from 03/07/2021 in [facility] Video Visit from 12/07/2020 in [facility]  PHQ-2 Total Score 0 2 0 0 1  PHQ-9 Total Score -- 10 -- -- --      Flowsheet Row Video Visit from 08/06/2022 in [facility] Video Visit from 03/07/2021 in [facility] Video Visit from 10/19/2020 in [facility]  C-SSRS RISK CATEGORY No Risk No Risk Low Risk        Assessment and Plan: CHAVELLE GAMBLER is a 36 year old Caucasian female, divorced, employed, has a history of depression, anxiety was evaluated by telemedicine today.  Patient is currently stable with regards to her mood although has attention and focus deficit has upcoming neuropsychological testing scheduled.  Plan as noted below.  Plan MDD in remission Wellbutrin XL 300 mg p.o. daily L-Methylfolate as prescribed  GAD-improving BuSpar 15 mg p.o. twice daily Continue CBT with Ms. Tommy Rainwater Hydroxyzine 25 mg p.o. daily as needed for severe anxiety attacks Propranolol 20 mg p.o. twice daily as needed  Insomnia-improving Continue sleep hygiene techniques Doxepin 10 to 20 mg p.o. nightly  as needed  Attention and concentration deficit-patient has upcoming neuropsychological testing scheduled. Follow-up in clinic after testing report comes in.  Patient agrees to call back to schedule the appointment.   Consent: Patient/Guardian gives verbal consent for treatment and assignment  of benefits for services provided during this visit. Patient/Guardian expressed understanding and agreed to proceed.    This note was generated in part or whole with voice recognition software. Voice recognition is usually quite accurate but there are transcription errors that can and very often do occur. I apologize for any typographical errors that were not detected and corrected.     Jomarie Longs, MD 08/07/2022, 9:32 AM

## 2022-08-07 ENCOUNTER — Encounter: Payer: No Typology Code available for payment source | Attending: Psychology

## 2022-08-07 DIAGNOSIS — R41840 Attention and concentration deficit: Secondary | ICD-10-CM | POA: Diagnosis present

## 2022-08-07 NOTE — Progress Notes (Unsigned)
   Behavioral Observations The patient appeared well-groomed and appropriately dressed. Her manners were polite and appropriate to the situation. She demonstrated a positive attitude toward testing and showed good effort.   Neuropsychology Note  Rhonda [name] completed 90 minutes of neuropsychological testing with technician, Dina Rich, BA, under the supervision of [name] Skill, PsyD., Clinical Neuropsychologist. The patient did not appear overtly distressed by the testing session, per behavioral observation or via self-report to the technician. Rest breaks were offered.   Clinical Decision Making: In considering the patient's current level of functioning, level of presumed impairment, nature of symptoms, emotional and behavioral responses during clinical interview, level of literacy, and observed level of motivation/effort, a battery of tests was selected by Dr. Sima Matas during initial consultation on 07/19/2022. This was communicated to the technician. Communication between the neuropsychologist and technician was ongoing throughout the testing session and changes were made as deemed necessary based on patient performance on testing, technician observations and additional pertinent factors such as those listed above.  Tests Administered: Comprehensive Attention Battery (CAB) Continuous Performance Test (CPT)   Results: Will be included in final report   Feedback to Patient: Rhonda [name] will return on 01/08/2023 for an interactive feedback session with Dr. Sima Matas at which time her test performances, clinical impressions and treatment recommendations will be reviewed in detail. The patient understands she can contact our office should she require our assistance before this time.  90 minutes spent face-to-face with patient administering standardized tests, 30 minutes spent scoring Environmental education officer). [CPT [id number], 14103]  Full report to follow.

## 2022-08-08 ENCOUNTER — Other Ambulatory Visit (HOSPITAL_COMMUNITY): Payer: Self-pay

## 2022-08-15 ENCOUNTER — Other Ambulatory Visit: Payer: Self-pay

## 2022-08-15 NOTE — Progress Notes (Signed)
Chief Complaint:   OBESITY Rhonda [name] is here to discuss her progress with her obesity treatment plan along with follow-up of her obesity related diagnoses. Rhonda [name] is on the Category 2 Plan and states she is following her eating plan approximately 50% of the time. Rhonda [name] states she is HIT exercise 45 minutes 1 times per week.  Today's visit was #: 81 Starting weight: 273 lbs Starting date: 01/09/2018 Today's weight: 223 lbs Today's date: 07/30/2022 Total lbs lost to date: 50 lbs Total lbs lost since last in-office visit: 0  Interim History: Patient has done well overall with weight loss and maintained well over Thanksgiving.  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  Discussed obtaining a pedal exerciser to use at her desk.  Taking [name] 12.5 mg weekly with no side effects.  Subjective:   1. Insulin resistance Rhonda [name] is taking [name] 12.5 mg weekly with no side effects.  May not have coverage for [facility] going forward and discussed could change to Zepbound or [place] as outlined in office visit per [facility], [place] 07/02/2022.   2. Vitamin D deficiency Rhonda [name] is taking over-the-counter 5000 units daily-Denies any side effects.  Level of 49.5 on 04/02/2022.  Assessment/Plan:   1. Insulin resistance We will refill [name] 12.5 mg SQ once weekly for 1 month with 0 refills.  -Refill tirzepatide ([name]) 12.5 MG/0.5ML Pen; Inject 12.5 mg into the skin once a week.  Dispense: 2 mL; Refill: 0 Continue prescribed nutrition plan to decrease simple carbohydrates, decrease saturated fats, increase lean protein and exercise to promote weight loss.   2. Vitamin D deficiency Continue over-the-counter vitamin D5 1000 units daily.  Check level periodically 2-3 times yearly to avoid oversupplementation.  3. Obesity, current BMI 32.1 Rhonda [name] is currently in the action stage of change. As such, her goal is to continue with weight loss efforts.  She has agreed to the Category 2 Plan.   Exercise goals: As is. Adding under desk peddler.  Behavioral modification strategies: increasing lean protein intake, decreasing simple carbohydrates, no skipping meals, and holiday eating strategies .  Rhonda [name] has agreed to follow-up with our clinic in 4 weeks. She was informed of the importance of frequent follow-up visits to maximize her success with intensive lifestyle modifications for her multiple health conditions.   Objective:   Blood pressure 125/86, pulse 65, temperature 98.1 F (36.7 C), height '5\' 10"'$  (1.778 m), weight 223 lb (101.2 kg), SpO2 100 %. Body mass index is 32 kg/m.  General: Cooperative, alert, well developed, in no acute distress. HEENT: Conjunctivae and lids unremarkable. Cardiovascular: Regular rhythm.  Lungs: Normal work of breathing. Neurologic: No focal deficits.   Lab Results  Component Value Date   CREATININE 0.91 04/02/2022   BUN 8 04/02/2022   NA 137 04/02/2022   K 4.3 04/02/2022   CL 103 04/02/2022   CO2 21 04/02/2022   Lab Results  Component Value Date   ALT 23 04/02/2022   AST 21 04/02/2022   ALKPHOS 54 04/02/2022   BILITOT 0.4 04/02/2022   Lab Results  Component Value Date   HGBA1C 4.9 04/02/2022   HGBA1C 5.0 08/23/2021   HGBA1C 5.0 03/07/2021   HGBA1C 5.0 07/19/2020   HGBA1C 5.2 01/25/2020   Lab Results  Component Value Date   INSULIN 17.1 04/02/2022   INSULIN 7.0 08/23/2021   INSULIN 20.8 03/07/2021   INSULIN 34.0 (H) 07/19/2020   INSULIN 9.9 01/25/2020   Lab Results  Component Value  Date   TSH 2.280 03/07/2021   Lab Results  Component Value Date   CHOL 143 04/02/2022   HDL 54 04/02/2022   LDLCALC 75 04/02/2022   TRIG 68 04/02/2022   CHOLHDL 2.6 04/02/2022   Lab Results  Component Value Date   VD25OH 49.5 04/02/2022   VD25OH 70.0 03/07/2021   VD25OH 44.3 07/19/2020   Lab Results  Component Value Date   WBC 4.7 01/09/2018   HGB 14.5 01/09/2018   HCT 42.1  01/09/2018   MCV 89 01/09/2018   PLT 195 09/20/2016   No results found for: "IRON", "TIBC", "FERRITIN"  Attestation Statements:   Reviewed by clinician on day of visit: allergies, medications, problem list, medical history, surgical history, family history, social history, and previous encounter notes.  I, Brendell Tyus, am acting as transcriptionist for [employer], PA.  I have reviewed the above documentation for accuracy and completeness, and I agree with the above. -  Kenidy Crossland,PA-C

## 2022-08-21 ENCOUNTER — Ambulatory Visit (INDEPENDENT_AMBULATORY_CARE_PROVIDER_SITE_OTHER): Payer: 59 | Admitting: Adult Health

## 2022-08-21 ENCOUNTER — Other Ambulatory Visit (HOSPITAL_COMMUNITY): Payer: Self-pay

## 2022-08-21 ENCOUNTER — Encounter (INDEPENDENT_AMBULATORY_CARE_PROVIDER_SITE_OTHER): Payer: Self-pay | Admitting: Adult Health

## 2022-08-21 VITALS — BP 113/78 | HR 71 | Temp 98.4°F | Ht 70.0 in | Wt 227.0 lb

## 2022-08-21 DIAGNOSIS — Z6832 Body mass index (BMI) 32.0-32.9, adult: Secondary | ICD-10-CM

## 2022-08-21 DIAGNOSIS — E669 Obesity, unspecified: Secondary | ICD-10-CM | POA: Diagnosis not present

## 2022-08-21 DIAGNOSIS — E88819 Insulin resistance, unspecified: Secondary | ICD-10-CM

## 2022-08-21 MED ORDER — ZEPBOUND 12.5 MG/0.5ML ~~LOC~~ SOAJ
12.5000 mg | SUBCUTANEOUS | 0 refills | Status: DC
  Filled 2022-08-21: qty 2, 28d supply, fill #0

## 2022-08-22 NOTE — Progress Notes (Signed)
Chief Complaint:   OBESITY Rhonda [name] is here to discuss her progress with her obesity treatment plan along with follow-up of her obesity related diagnoses. Rhonda [name] is on the Category 2 Plan and states she is following her eating plan approximately 50% of the time. Rhonda [name] states she is not exercising.  Today's visit was #: 60 Starting weight: 30 LBS Starting date: 01/09/2018 Today's weight: 227 LBS Today's date: 08/21/2022 Total lbs lost to date: 57 LBS Total lbs lost since last in-office visit: +4 LBS  Interim History: ***  Subjective:   1. Insulin resistance Patient has been on [name] therapy > 1 year.  She is currently on 12.5 mg weekly injection.  Only side effect evening reflux, but she feels it is related to eating too late.  Assessment/Plan:   1. Insulin resistance Start- tirzepatide (ZEPBOUND) 12.5 MG/0.5ML Pen; Inject 12.5 mg into the skin once a week.  Dispense: 2 mL; Refill: 0  2. Obesity, current BMI 32.6 Start- tirzepatide (ZEPBOUND) 12.5 MG/0.5ML Pen; Inject 12.5 mg into the skin once a week.  Dispense: 2 mL; Refill: 0  Rhonda [name] is currently in the action stage of change. As such, her goal is to continue with weight loss efforts. She has agreed to the Category 2 Plan.   Exercise goals: {MWM EXERCISE RECS:23473}  Behavioral modification strategies: increasing lean protein intake, decreasing simple carbohydrates, meal planning and cooking strategies, keeping healthy foods in the home, and planning for success.  Rhonda [name] has agreed to follow-up with our clinic in 2-3 weeks. She was informed of the importance of frequent follow-up visits to maximize her success with intensive lifestyle modifications for her multiple health conditions.   Objective:   Blood pressure 113/78, pulse 71, temperature 98.4 F (36.9 C), height '5\' 10"'$  (1.778 m), weight 227 lb (103 kg), SpO2 99 %. Body mass index is 32.57 kg/m.  General: Cooperative, alert, well developed, in no acute  distress. HEENT: Conjunctivae and lids unremarkable. Cardiovascular: Regular rhythm.  Lungs: Normal work of breathing. Neurologic: No focal deficits.   Lab Results  Component Value Date   CREATININE 0.91 04/02/2022   BUN 8 04/02/2022   NA 137 04/02/2022   K 4.3 04/02/2022   CL 103 04/02/2022   CO2 21 04/02/2022   Lab Results  Component Value Date   ALT 23 04/02/2022   AST 21 04/02/2022   ALKPHOS 54 04/02/2022   BILITOT 0.4 04/02/2022   Lab Results  Component Value Date   HGBA1C 4.9 04/02/2022   HGBA1C 5.0 08/23/2021   HGBA1C 5.0 03/07/2021   HGBA1C 5.0 07/19/2020   HGBA1C 5.2 01/25/2020   Lab Results  Component Value Date   INSULIN 17.1 04/02/2022   INSULIN 7.0 08/23/2021   INSULIN 20.8 03/07/2021   INSULIN 34.0 (H) 07/19/2020   INSULIN 9.9 01/25/2020   Lab Results  Component Value Date   TSH 2.280 03/07/2021   Lab Results  Component Value Date   CHOL 143 04/02/2022   HDL 54 04/02/2022   LDLCALC 75 04/02/2022   TRIG 68 04/02/2022   CHOLHDL 2.6 04/02/2022   Lab Results  Component Value Date   VD25OH 49.5 04/02/2022   VD25OH 70.0 03/07/2021   VD25OH 44.3 07/19/2020   Lab Results  Component Value Date   WBC 4.7 01/09/2018   HGB 14.5 01/09/2018   HCT 42.1 01/09/2018   MCV 89 01/09/2018   PLT 195 09/20/2016   No results found for: "IRON", "TIBC", "FERRITIN"  Attestation Statements:   Reviewed  by clinician on day of visit: allergies, medications, problem list, medical history, surgical history, family history, social history, and previous encounter notes.  I, Davy Pique, RMA, am acting as Location manager for Mina Marble, NP.  I have reviewed the above documentation for accuracy and completeness, and I agree with the above. -  ***

## 2022-08-23 ENCOUNTER — Encounter: Payer: 59 | Attending: Psychology | Admitting: Psychology

## 2022-08-23 ENCOUNTER — Other Ambulatory Visit (HOSPITAL_COMMUNITY): Payer: Self-pay

## 2022-08-23 ENCOUNTER — Encounter (INDEPENDENT_AMBULATORY_CARE_PROVIDER_SITE_OTHER): Payer: Self-pay | Admitting: Adult Health

## 2022-08-23 ENCOUNTER — Encounter (INDEPENDENT_AMBULATORY_CARE_PROVIDER_SITE_OTHER): Payer: Self-pay

## 2022-08-23 DIAGNOSIS — R41840 Attention and concentration deficit: Secondary | ICD-10-CM | POA: Diagnosis not present

## 2022-08-23 DIAGNOSIS — F5105 Insomnia due to other mental disorder: Secondary | ICD-10-CM | POA: Diagnosis not present

## 2022-08-23 DIAGNOSIS — F330 Major depressive disorder, recurrent, mild: Secondary | ICD-10-CM

## 2022-08-23 DIAGNOSIS — F908 Attention-deficit hyperactivity disorder, other type: Secondary | ICD-10-CM | POA: Diagnosis not present

## 2022-08-23 NOTE — Telephone Encounter (Signed)
Submitted PA for [facility], waiting for determination. TEL:[id number]

## 2022-08-27 ENCOUNTER — Telehealth (INDEPENDENT_AMBULATORY_CARE_PROVIDER_SITE_OTHER): Payer: Self-pay

## 2022-08-27 ENCOUNTER — Encounter (INDEPENDENT_AMBULATORY_CARE_PROVIDER_SITE_OTHER): Payer: Self-pay

## 2022-08-27 NOTE — Telephone Encounter (Signed)
Submitted a PA for [facility] on 08/23/2022 Key: [id number], which was denied on 08/25/2022  I was informed by her provider [facility]) that she didn't want to start her on [place], but she wanted to do Zepbound instead, so I will submit that PA today for Zepbound.   Submitted PA for Zepbound at 4:09 on 08/27/22, waiting for a reply, can take anywhere from 24-48 hours.  08/29/2022- PA for Zepbound was denied, the reason given was the requested drug did not meet the guideline rules.

## 2022-08-29 ENCOUNTER — Encounter (INDEPENDENT_AMBULATORY_CARE_PROVIDER_SITE_OTHER): Payer: Self-pay | Admitting: Adult Health

## 2022-08-29 ENCOUNTER — Encounter (INDEPENDENT_AMBULATORY_CARE_PROVIDER_SITE_OTHER): Payer: Self-pay

## 2022-08-30 ENCOUNTER — Other Ambulatory Visit (INDEPENDENT_AMBULATORY_CARE_PROVIDER_SITE_OTHER): Payer: Self-pay | Admitting: Adult Health

## 2022-08-30 ENCOUNTER — Encounter (INDEPENDENT_AMBULATORY_CARE_PROVIDER_SITE_OTHER): Payer: Self-pay | Admitting: Adult Health

## 2022-08-30 ENCOUNTER — Other Ambulatory Visit (HOSPITAL_COMMUNITY): Payer: Self-pay

## 2022-08-30 ENCOUNTER — Telehealth (INDEPENDENT_AMBULATORY_CARE_PROVIDER_SITE_OTHER): Payer: Self-pay

## 2022-08-30 ENCOUNTER — Other Ambulatory Visit: Payer: Self-pay

## 2022-08-30 MED ORDER — WEGOVY 2.4 MG/0.75ML ~~LOC~~ SOAJ
2.4000 mg | SUBCUTANEOUS | 0 refills | Status: DC
  Filled 2022-08-30 – 2022-09-06 (×2): qty 3, 28d supply, fill #0

## 2022-08-30 NOTE — Telephone Encounter (Addendum)
PA submitted for [facility], waiting for a response.08/30/22  PA approved for [employer]. From 09/01/22 until 03/23/23

## 2022-09-05 ENCOUNTER — Encounter (INDEPENDENT_AMBULATORY_CARE_PROVIDER_SITE_OTHER): Payer: Self-pay

## 2022-09-06 ENCOUNTER — Telehealth (INDEPENDENT_AMBULATORY_CARE_PROVIDER_SITE_OTHER): Payer: Self-pay | Admitting: Adult Health

## 2022-09-06 ENCOUNTER — Ambulatory Visit (INDEPENDENT_AMBULATORY_CARE_PROVIDER_SITE_OTHER): Payer: 59 | Admitting: Adult Health

## 2022-09-06 ENCOUNTER — Other Ambulatory Visit: Payer: Self-pay

## 2022-09-06 ENCOUNTER — Encounter (INDEPENDENT_AMBULATORY_CARE_PROVIDER_SITE_OTHER): Payer: Self-pay | Admitting: Adult Health

## 2022-09-06 VITALS — BP 112/76 | HR 70 | Temp 97.8°F | Ht 70.0 in | Wt 229.0 lb

## 2022-09-06 DIAGNOSIS — E669 Obesity, unspecified: Secondary | ICD-10-CM | POA: Diagnosis not present

## 2022-09-06 DIAGNOSIS — Z6832 Body mass index (BMI) 32.0-32.9, adult: Secondary | ICD-10-CM

## 2022-09-06 DIAGNOSIS — E88819 Insulin resistance, unspecified: Secondary | ICD-10-CM | POA: Diagnosis not present

## 2022-09-06 NOTE — Telephone Encounter (Signed)
Hey Tammy! I had a patient today with a balance of $100 on 08/21/2022 with [facility]. She would like to be called to understand why she owes this balance. I left a copy of her receipt on your desk and I will also email it to you as well. Telephone note made also.

## 2022-09-10 DIAGNOSIS — F4322 Adjustment disorder with anxiety: Secondary | ICD-10-CM | POA: Diagnosis not present

## 2022-09-10 DIAGNOSIS — F331 Major depressive disorder, recurrent, moderate: Secondary | ICD-10-CM | POA: Diagnosis not present

## 2022-09-10 NOTE — Telephone Encounter (Signed)
Patient states she has spoken with ins and has found out that she has a deductible she has to meet.

## 2022-09-11 ENCOUNTER — Other Ambulatory Visit: Payer: Self-pay

## 2022-09-12 NOTE — Progress Notes (Signed)
Chief Complaint:   OBESITY Rhonda [name] is here to discuss her progress with her obesity treatment plan along with follow-up of her obesity related diagnoses. Rhonda [name] is on the Category 2 Plan and states she is following her eating plan approximately 60% of the time. Rhonda [name] states she is not exercising.    Today's visit was #: 78 Starting weight: 38 LBS Starting date: 01/09/2018 Today's weight: 229 LBS Today's date: 09/06/2022 Total lbs lost to date: 60 LBS Total lbs lost since last in-office visit: +2 LBS  Interim History:  Last dose of [name] 12.'5mg'$  therapy was 09/04/2022. Zepbound therapy denied. She will start [facility] 2.'4mg'$  next week.  2024 Health Goal 1) Simply put- eat better!  Of Note- Her son is age 15, her daughter is age 24  Subjective:   1. Insulin resistance Patient insulin level 17.1, 7.0, 20.8, 34.0, 7.9, 20.3, 13.8, 13.0, 16.5. Last dose of [name] 12.'5mg'$  therapy was 09/04/2022. She will start [facility] 2.'4mg'$  next week.  Assessment/Plan:   1. Insulin resistance Change from [facility] to [facility] therapy, does not require RF today.  2. Obesity, current BMI 32.9 Rhonda [name] is currently in the action stage of change. As such, her goal is to continue with weight loss efforts. She has agreed to the Category 2 Plan.   Exercise goals:  As is.  Behavioral modification strategies: increasing lean protein intake, decreasing simple carbohydrates, better snacking choices, and keeping a strict food journal.  Rhonda [name] has agreed to follow-up with our clinic in 4 weeks. She was informed of the importance of frequent follow-up visits to maximize her success with intensive lifestyle modifications for her multiple health conditions.   Objective:   Blood pressure 112/76, pulse 70, temperature 97.8 F (36.6 C), height '5\' 10"'$  (1.778 m), weight 229 lb (103.9 kg), SpO2 98 %. Body mass index is 32.86 kg/m.  General: Cooperative, alert, well developed, in no acute distress. HEENT:  Conjunctivae and lids unremarkable. Cardiovascular: Regular rhythm.  Lungs: Normal work of breathing. Neurologic: No focal deficits.   Lab Results  Component Value Date   CREATININE 0.91 04/02/2022   BUN 8 04/02/2022   NA 137 04/02/2022   K 4.3 04/02/2022   CL 103 04/02/2022   CO2 21 04/02/2022   Lab Results  Component Value Date   ALT 23 04/02/2022   AST 21 04/02/2022   ALKPHOS 54 04/02/2022   BILITOT 0.4 04/02/2022   Lab Results  Component Value Date   HGBA1C 4.9 04/02/2022   HGBA1C 5.0 08/23/2021   HGBA1C 5.0 03/07/2021   HGBA1C 5.0 07/19/2020   HGBA1C 5.2 01/25/2020   Lab Results  Component Value Date   INSULIN 17.1 04/02/2022   INSULIN 7.0 08/23/2021   INSULIN 20.8 03/07/2021   INSULIN 34.0 (H) 07/19/2020   INSULIN 9.9 01/25/2020   Lab Results  Component Value Date   TSH 2.280 03/07/2021   Lab Results  Component Value Date   CHOL 143 04/02/2022   HDL 54 04/02/2022   LDLCALC 75 04/02/2022   TRIG 68 04/02/2022   CHOLHDL 2.6 04/02/2022   Lab Results  Component Value Date   VD25OH 49.5 04/02/2022   VD25OH 70.0 03/07/2021   VD25OH 44.3 07/19/2020   Lab Results  Component Value Date   WBC 4.7 01/09/2018   HGB 14.5 01/09/2018   HCT 42.1 01/09/2018   MCV 89 01/09/2018   PLT 195 09/20/2016   No results found for: "IRON", "TIBC", "FERRITIN"  Attestation Statements:   Reviewed by clinician on  day of visit: allergies, medications, problem list, medical history, surgical history, family history, social history, and previous encounter notes.  I, Davy Pique, RMA, am acting as Location manager for Mina Marble, NP.  I have reviewed the above documentation for accuracy and completeness, and I agree with the above. -  Sharrell Krawiec d. Jamesina Gaugh, NP-C

## 2022-09-18 ENCOUNTER — Telehealth: Payer: 59 | Admitting: Nurse Practitioner

## 2022-09-18 DIAGNOSIS — J40 Bronchitis, not specified as acute or chronic: Secondary | ICD-10-CM | POA: Diagnosis not present

## 2022-09-18 DIAGNOSIS — J0140 Acute pansinusitis, unspecified: Secondary | ICD-10-CM | POA: Diagnosis not present

## 2022-09-19 ENCOUNTER — Other Ambulatory Visit: Payer: Self-pay

## 2022-09-19 MED ORDER — DOXYCYCLINE HYCLATE 100 MG PO TABS
100.0000 mg | ORAL_TABLET | Freq: Two times a day (BID) | ORAL | 0 refills | Status: AC
  Filled 2022-09-19: qty 20, 10d supply, fill #0

## 2022-09-19 MED ORDER — BENZONATATE 100 MG PO CAPS
100.0000 mg | ORAL_CAPSULE | Freq: Three times a day (TID) | ORAL | 0 refills | Status: DC | PRN
  Filled 2022-09-19: qty 30, 10d supply, fill #0

## 2022-09-19 NOTE — Progress Notes (Signed)
We are sorry that you are not feeling well.  Here is how we plan to help!  Based on your presentation I believe you most likely have A cough due to bacteria.  When patients have a fever and a productive cough with a change in color or increased sputum production, we are concerned about bacterial bronchitis.  If left untreated it can progress to pneumonia.  If your symptoms do not improve with your treatment plan it is important that you contact your provider.   I have prescribed Doxycycline 100 mg twice a day for 10 days. This antibiotic will also cover a potential sinus infection.      In addition you may use A prescription cough medication called Tessalon Perles '100mg'$ . You may take 1-2 capsules every 8 hours as needed for your cough.   From your responses in the eVisit questionnaire you describe inflammation in the upper respiratory tract which is causing a significant cough.  This is commonly called Bronchitis and has four common causes:   Allergies Viral Infections Acid Reflux Bacterial Infection Allergies, viruses and acid reflux are treated by controlling symptoms or eliminating the cause. An example might be a cough caused by taking certain blood pressure medications. You stop the cough by changing the medication. Another example might be a cough caused by acid reflux. Controlling the reflux helps control the cough.  USE OF BRONCHODILATOR ("RESCUE") INHALERS: There is a risk from using your bronchodilator too frequently.  The risk is that over-reliance on a medication which only relaxes the muscles surrounding the breathing tubes can reduce the effectiveness of medications prescribed to reduce swelling and congestion of the tubes themselves.  Although you feel brief relief from the bronchodilator inhaler, your asthma may actually be worsening with the tubes becoming more swollen and filled with mucus.  This can delay other crucial treatments, such as oral steroid medications. If you need to use  a bronchodilator inhaler daily, several times per day, you should discuss this with your provider.  There are probably better treatments that could be used to keep your asthma under control.     HOME CARE Only take medications as instructed by your medical team. Complete the entire course of an antibiotic. Drink plenty of fluids and get plenty of rest. Avoid close contacts especially the very young and the elderly Cover your mouth if you cough or cough into your sleeve. Always remember to wash your hands A steam or ultrasonic humidifier can help congestion.   GET HELP RIGHT AWAY IF: You develop worsening fever. You become short of breath You cough up blood. Your symptoms persist after you have completed your treatment plan MAKE SURE YOU  Understand these instructions. Will watch your condition. Will get help right away if you are not doing well or get worse.    Thank you for choosing an e-visit.  Your e-visit answers were reviewed by a board certified advanced clinical practitioner to complete your personal care plan. Depending upon the condition, your plan could have included both over the counter or prescription medications.  Please review your pharmacy choice. Make sure the pharmacy is open so you can pick up prescription now. If there is a problem, you may contact your provider through [employer] and have the prescription routed to another pharmacy.  Your safety is important to [name]. If you have drug allergies check your prescription carefully.   For the next 24 hours you can use MyChart to ask questions about today's visit, request a  non-urgent call back, or ask for a work or school excuse. You will get an email in the next two days asking about your experience. I hope that your e-visit has been valuable and will speed your recovery.   Meds ordered this encounter  Medications   doxycycline (VIBRA-TABS) 100 MG tablet    Sig: Take 1 tablet (100 mg total) by mouth 2 (two)  times daily for 10 days.    Dispense:  20 tablet    Refill:  0   benzonatate (TESSALON) 100 MG capsule    Sig: Take 1 capsule (100 mg total) by mouth 3 (three) times daily as needed.    Dispense:  30 capsule    Refill:  0     I spent approximately 5 minutes reviewing the patient's history, current symptoms and coordinating their care today.

## 2022-10-04 ENCOUNTER — Other Ambulatory Visit (HOSPITAL_COMMUNITY): Payer: Self-pay

## 2022-10-04 ENCOUNTER — Encounter (INDEPENDENT_AMBULATORY_CARE_PROVIDER_SITE_OTHER): Payer: Self-pay | Admitting: Adult Health

## 2022-10-04 ENCOUNTER — Ambulatory Visit (INDEPENDENT_AMBULATORY_CARE_PROVIDER_SITE_OTHER): Payer: 59 | Admitting: Adult Health

## 2022-10-04 VITALS — BP 118/84 | HR 70 | Temp 97.6°F | Ht 70.0 in | Wt 230.0 lb

## 2022-10-04 DIAGNOSIS — Z6839 Body mass index (BMI) 39.0-39.9, adult: Secondary | ICD-10-CM

## 2022-10-04 DIAGNOSIS — E559 Vitamin D deficiency, unspecified: Secondary | ICD-10-CM

## 2022-10-04 DIAGNOSIS — E88819 Insulin resistance, unspecified: Secondary | ICD-10-CM

## 2022-10-04 DIAGNOSIS — E669 Obesity, unspecified: Secondary | ICD-10-CM

## 2022-10-04 DIAGNOSIS — E66812 Obesity, class 2: Secondary | ICD-10-CM

## 2022-10-04 DIAGNOSIS — R7989 Other specified abnormal findings of blood chemistry: Secondary | ICD-10-CM | POA: Diagnosis not present

## 2022-10-04 DIAGNOSIS — Z6833 Body mass index (BMI) 33.0-33.9, adult: Secondary | ICD-10-CM | POA: Diagnosis not present

## 2022-10-04 MED ORDER — WEGOVY 2.4 MG/0.75ML ~~LOC~~ SOAJ
2.4000 mg | SUBCUTANEOUS | 0 refills | Status: DC
  Filled 2022-10-04: qty 3, 28d supply, fill #0

## 2022-10-04 NOTE — Progress Notes (Signed)
Chief Complaint:   OBESITY Rhonda [name] is here to discuss her progress with her obesity treatment plan along with follow-up of her obesity related diagnoses. Rhonda [name] is on the Category 2 Plan and states she is following her eating plan approximately 50% of the time.  Khalia states she is not currently exercising.  Today's visit was #: 10 Starting weight: 273 lbs Starting date: 01/09/2018 Today's weight: 230 lbs Today's date: 10/04/2022 Total lbs lost to date: 43 lbs Total lbs lost since last in-office visit: + 1lb  Interim History:  Reviewed Bioempednece results with pt: Muscle Mass +2.8 lbs Adipose Mass - 2.2 lbs  She will eat out the evenings that she does not have her children- difficult to cook for only one person.  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.  Subjective:   1. Elevated serum creatinine EPIC review demonstrates hx of elevates serum creat She denies hx of renal disease. She denies frequent NSAID use.  2. Insulin resistance Currently on weekly Wegovy 2.62m- denies mass in neck/dysphagia/dyspepsia/persistent hoarseness/abd pain/N//V/C.  3. Vitamin D deficiency  Latest Reference Range & Units 04/02/22 11:04  Vitamin D, 25-Hydroxy 30.0 - 100.0 ng/mL 49.5  On daily OTC Vit D3 5000 IU   Assessment/Plan:   1. Elevated serum creatinine Check Labs - Comprehensive metabolic panel Avoid Nephrotoxic substances  2. Insulin resistance Check labs - Hemoglobin A1c - Insulin, random  3. Vitamin D deficiency Check labs - VITAMIN D 25 Hydroxy (Vit-D Deficiency, Fractures)  5. Obesity, current BMI 33.0 Refill Wegovy 2.432monce weekly injection Disp 25m67mF 0  LinAnzley currently in the action stage of change. As such, her goal is to continue with weight loss efforts. She has agreed to the Category 2 Plan.   Exercise goals:  Walk during Web[employer] meetings- at least 5 times per  week  Behavioral modification strategies: increasing lean protein intake, decreasing simple carbohydrates, increasing vegetables, increasing water intake, decreasing eating out, no skipping meals, emotional eating strategies, and planning for success.  LinLindsays agreed to follow-up with our clinic in 4 weeks. She was informed of the importance of frequent follow-up visits to maximize her success with intensive lifestyle modifications for her multiple health conditions.   LinTerrels informed we would discuss her lab results at her next visit unless there is a critical issue that needs to be addressed sooner. LinJaylnnreed to keep her next visit at the agreed upon time to discuss these results.  Objective:   Blood pressure 118/84, pulse 70, temperature 97.6 F (36.4 C), height 5' 10"$  (1.778 m), weight 230 lb (104.3 kg), SpO2 99 %. Body mass index is 33 kg/m.  General: Cooperative, alert, well developed, in no acute distress. HEENT: Conjunctivae and lids unremarkable. Cardiovascular: Regular rhythm.  Lungs: Normal work of breathing. Neurologic: No focal deficits.   Lab Results  Component Value Date   CREATININE 0.91 04/02/2022   BUN 8 04/02/2022   NA 137 04/02/2022   K 4.3 04/02/2022   CL 103 04/02/2022   CO2 21 04/02/2022   Lab Results  Component Value Date   ALT 23 04/02/2022   AST 21 04/02/2022   ALKPHOS 54 04/02/2022   BILITOT 0.4 04/02/2022   Lab Results  Component Value Date   HGBA1C 4.9 04/02/2022   HGBA1C 5.0 08/23/2021   HGBA1C 5.0 03/07/2021   HGBA1C 5.0 07/19/2020   HGBA1C 5.2 01/25/2020  Lab Results  Component Value Date   INSULIN 17.1 04/02/2022   INSULIN 7.0 08/23/2021   INSULIN 20.8 03/07/2021   INSULIN 34.0 (H) 07/19/2020   INSULIN 9.9 01/25/2020   Lab Results  Component Value Date   TSH 2.280 03/07/2021   Lab Results  Component Value Date   CHOL 143 04/02/2022   HDL 54 04/02/2022   LDLCALC 75 04/02/2022   TRIG 68 04/02/2022   CHOLHDL  2.6 04/02/2022   Lab Results  Component Value Date   VD25OH 49.5 04/02/2022   VD25OH 70.0 03/07/2021   VD25OH 44.3 07/19/2020   Lab Results  Component Value Date   WBC 4.7 01/09/2018   HGB 14.5 01/09/2018   HCT 42.1 01/09/2018   MCV 89 01/09/2018   PLT 195 09/20/2016   No results found for: "IRON", "TIBC", "FERRITIN"  Attestation Statements:   Reviewed by clinician on day of visit: allergies, medications, problem list, medical history, surgical history, family history, social history, and previous encounter notes.  I have reviewed the above documentation for accuracy and completeness, and I agree with the above. -  Joniel Graumann d. Hillery Zachman, NP-C

## 2022-10-05 LAB — COMPREHENSIVE METABOLIC PANEL
ALT: 27 IU/L (ref 0–32)
AST: 28 IU/L (ref 0–40)
Albumin/Globulin Ratio: 2.2 (ref 1.2–2.2)
Albumin: 4.7 g/dL (ref 3.9–4.9)
Alkaline Phosphatase: 66 IU/L (ref 44–121)
BUN/Creatinine Ratio: 10 (ref 9–23)
BUN: 9 mg/dL (ref 6–20)
Bilirubin Total: 0.6 mg/dL (ref 0.0–1.2)
CO2: 22 mmol/L (ref 20–29)
Calcium: 9.5 mg/dL (ref 8.7–10.2)
Chloride: 101 mmol/L (ref 96–106)
Creatinine, Ser: 0.94 mg/dL (ref 0.57–1.00)
Globulin, Total: 2.1 g/dL (ref 1.5–4.5)
Glucose: 71 mg/dL (ref 70–99)
Potassium: 4.2 mmol/L (ref 3.5–5.2)
Sodium: 139 mmol/L (ref 134–144)
Total Protein: 6.8 g/dL (ref 6.0–8.5)
eGFR: 80 mL/min/{1.73_m2} (ref >59)

## 2022-10-05 LAB — VITAMIN D 25 HYDROXY (VIT D DEFICIENCY, FRACTURES): Vit D, 25-Hydroxy: 38.5 ng/mL (ref 30.0–100.0)

## 2022-10-05 LAB — HEMOGLOBIN A1C
Est. average glucose Bld gHb Est-mCnc: 97 mg/dL
Hgb A1c MFr Bld: 5 % (ref 4.8–5.6)

## 2022-10-05 LAB — INSULIN, RANDOM: INSULIN: 10.8 u[IU]/mL (ref 2.6–24.9)

## 2022-10-08 DIAGNOSIS — F331 Major depressive disorder, recurrent, moderate: Secondary | ICD-10-CM | POA: Diagnosis not present

## 2022-10-08 DIAGNOSIS — F4322 Adjustment disorder with anxiety: Secondary | ICD-10-CM | POA: Diagnosis not present

## 2022-10-30 ENCOUNTER — Other Ambulatory Visit (HOSPITAL_COMMUNITY): Payer: Self-pay

## 2022-10-30 ENCOUNTER — Encounter (INDEPENDENT_AMBULATORY_CARE_PROVIDER_SITE_OTHER): Payer: Self-pay | Admitting: Adult Health

## 2022-10-30 ENCOUNTER — Ambulatory Visit (INDEPENDENT_AMBULATORY_CARE_PROVIDER_SITE_OTHER): Payer: 59 | Admitting: Adult Health

## 2022-10-30 VITALS — BP 115/78 | HR 70 | Temp 98.5°F | Ht 70.0 in | Wt 237.0 lb

## 2022-10-30 DIAGNOSIS — E669 Obesity, unspecified: Secondary | ICD-10-CM | POA: Diagnosis not present

## 2022-10-30 DIAGNOSIS — Z6834 Body mass index (BMI) 34.0-34.9, adult: Secondary | ICD-10-CM

## 2022-10-30 DIAGNOSIS — R7989 Other specified abnormal findings of blood chemistry: Secondary | ICD-10-CM | POA: Diagnosis not present

## 2022-10-30 DIAGNOSIS — E559 Vitamin D deficiency, unspecified: Secondary | ICD-10-CM

## 2022-10-30 DIAGNOSIS — R632 Polyphagia: Secondary | ICD-10-CM | POA: Diagnosis not present

## 2022-10-30 DIAGNOSIS — E88819 Insulin resistance, unspecified: Secondary | ICD-10-CM

## 2022-10-30 MED ORDER — VITAMIN D (ERGOCALCIFEROL) 1.25 MG (50000 UNIT) PO CAPS
50000.0000 [IU] | ORAL_CAPSULE | ORAL | 0 refills | Status: DC
  Filled 2022-10-30: qty 4, 28d supply, fill #0

## 2022-10-30 NOTE — Progress Notes (Signed)
WEIGHT SUMMARY AND BIOMETRICS  Vitals Temp: 98.5 F (36.9 C) BP: 115/78 Pulse Rate: 70 SpO2: 99 %   Anthropometric Measurements Height: '5\' 10"'$  (1.778 m) Weight: 237 lb (107.5 kg) BMI (Calculated): 34.01 Weight at Last Visit: 230lb Weight Lost Since Last Visit: +7lb Starting Weight: 273lb Total Weight Loss (lbs): 36 lb (16.3 kg)   Body Composition  Body Fat %: 39.3 % Fat Mass (lbs): 93.4 lbs Muscle Mass (lbs): 137 lbs Total Body Water (lbs): 96.2 lbs Visceral Fat Rating : 8   Other Clinical Data Fasting: no Labs: no Today's Visit #: 7 Starting Date: 01/09/18    Chief Complaint:   OBESITY Rhonda [name] is here to discuss her progress with her obesity treatment plan. She is on the the Category 2 Plan and states she is following her eating plan approximately 50 % of the time.  She states she is not currently exercising.   Interim History:  Reviewed Bioimpedance Results with pt: Muscle Mass + 2 lbs Adipose Mass +5.4 lbs  She has 2-3 more injections of Wegovy 2.'4mg'$  at home- she will finish out supply. [facility] Health insurance will no longer cover GLP-1 for Obesity tx. Denies mass in neck, dysphagia, dyspepsia, persistent hoarseness, abdominal pain, or N/V/C   Of Note-  Previously on Metformin- ineffective for polyphagia Previously on [name]- tolerated well and very effective for weight loss and appetite control. She has Mirena IUD  Subjective:   1. Vitamin D deficiency Discussed Labs  Latest Reference Range & Units 10/04/22 09:04  Vitamin D, 25-Hydroxy 30.0 - 100.0 ng/mL 38.5  She is on OTC Vit D 3 5,000 IU QD She endorses increase fatigue  2. Elevated serum creatinine Discussed Labs  Latest Reference Range & Units 10/04/22 09:04  Creatinine 0.57 - 1.00 mg/dL 0.94  Calcium 8.7 - 10.2 mg/dL 9.5  BUN/Creatinine Ratio 9 - 23  10  eGFR >59 mL/min/1.73 80  Serum creat and GFR both improved.  3. Insulin resistance Discussed Labs  Latest Reference  Range & Units 10/04/22 09:04  Glucose 70 - 99 mg/dL 71  Hemoglobin A1C 4.8 - 5.6 % 5.0  Est. average glucose Bld gHb Est-mCnc mg/dL 97  INSULIN 2.6 - 24.9 uIU/mL 10.8   4. Polyphagia She has 2-3 more injections of Wegovy 2.'4mg'$  at home- she will finish out supply. Denies mass in neck, dysphagia, dyspepsia, persistent hoarseness, abdominal pain, or N/V/C  Of Note-  Previously on Metformin- ineffective for polyphagia Previously on [name]- tolerated well and very effective for weight loss and appetite control.  She denies hx of seizure d/o. She experienced 2 episodes of Nephrolithiasis > 10 years ago. She is on Mirena for birth control.  Discussed risks/benefits of Topirmate therapy.  Assessment/Plan:   1. Vitamin D deficiency Start Ergocalciferol 50,000 IU once week Disp 4 RF 0  2. Elevated serum creatinine Remain well hydrated and avoid Nephrotoxic substances.  3. Insulin resistance Complete Wegovy therapy and increase Cat 2 plan compliance to at least 80%.  4. Polyphagia Complete Wegovy therapy and increase Cat 2 plan compliance to at least 80%. Consider Topiramate therapy in future.  5. Obesity, current BMI 34.01  Rhonda [name] is currently in the action stage of change. As such, her goal is to continue with weight loss efforts. She has agreed to the Category 2 Plan.   Exercise goals: All adults should avoid inactivity. Some physical activity is better than none, and adults who participate in any amount of physical activity gain some health benefits.  Adults should also include muscle-strengthening activities that involve all major muscle groups on 2 or more days a week. Intentional Movement 15 mins 2 x week.  Behavioral modification strategies: increasing lean protein intake, decreasing simple carbohydrates, increasing vegetables, increasing water intake, decreasing eating out, no skipping meals, meal planning and cooking strategies, keeping healthy foods in the home, emotional  eating strategies, and planning for success.  Rhonda [name] has agreed to follow-up with our clinic in 4 weeks. She was informed of the importance of frequent follow-up visits to maximize her success with intensive lifestyle modifications for her multiple health conditions.   Objective:   Blood pressure 115/78, pulse 70, temperature 98.5 F (36.9 C), height '5\' 10"'$  (1.778 m), weight 237 lb (107.5 kg), SpO2 99 %. Body mass index is 34.01 kg/m.  General: Cooperative, alert, well developed, in no acute distress. HEENT: Conjunctivae and lids unremarkable. Cardiovascular: Regular rhythm.  Lungs: Normal work of breathing. Neurologic: No focal deficits.   Lab Results  Component Value Date   CREATININE 0.94 10/04/2022   BUN 9 10/04/2022   NA 139 10/04/2022   K 4.2 10/04/2022   CL 101 10/04/2022   CO2 22 10/04/2022   Lab Results  Component Value Date   ALT 27 10/04/2022   AST 28 10/04/2022   ALKPHOS 66 10/04/2022   BILITOT 0.6 10/04/2022   Lab Results  Component Value Date   HGBA1C 5.0 10/04/2022   HGBA1C 4.9 04/02/2022   HGBA1C 5.0 08/23/2021   HGBA1C 5.0 03/07/2021   HGBA1C 5.0 07/19/2020   Lab Results  Component Value Date   INSULIN 10.8 10/04/2022   INSULIN 17.1 04/02/2022   INSULIN 7.0 08/23/2021   INSULIN 20.8 03/07/2021   INSULIN 34.0 (H) 07/19/2020   Lab Results  Component Value Date   TSH 2.280 03/07/2021   Lab Results  Component Value Date   CHOL 143 04/02/2022   HDL 54 04/02/2022   LDLCALC 75 04/02/2022   TRIG 68 04/02/2022   CHOLHDL 2.6 04/02/2022   Lab Results  Component Value Date   VD25OH 38.5 10/04/2022   VD25OH 49.5 04/02/2022   VD25OH 70.0 03/07/2021   Lab Results  Component Value Date   WBC 4.7 01/09/2018   HGB 14.5 01/09/2018   HCT 42.1 01/09/2018   MCV 89 01/09/2018   PLT 195 09/20/2016   No results found for: "IRON", "TIBC", "FERRITIN"  Attestation Statements:   Reviewed by clinician on day of visit: allergies, medications, problem  list, medical history, surgical history, family history, social history, and previous encounter notes.  I have reviewed the above documentation for accuracy and completeness, and I agree with the above. -  Tegan Burnside d. Jullianna Gabor, NP-C

## 2022-11-07 ENCOUNTER — Other Ambulatory Visit (HOSPITAL_COMMUNITY): Payer: Self-pay

## 2022-11-12 ENCOUNTER — Encounter (INDEPENDENT_AMBULATORY_CARE_PROVIDER_SITE_OTHER): Payer: Self-pay | Admitting: Adult Health

## 2022-11-12 ENCOUNTER — Other Ambulatory Visit: Payer: Self-pay

## 2022-11-12 ENCOUNTER — Encounter: Payer: Self-pay | Admitting: Psychology

## 2022-11-12 MED ORDER — OSELTAMIVIR PHOSPHATE 75 MG PO CAPS
ORAL_CAPSULE | ORAL | 0 refills | Status: DC
  Filled 2022-11-12: qty 10, 10d supply, fill #0

## 2022-11-12 NOTE — Progress Notes (Signed)
Neuropsychological Evaluation   Patient:  Rhonda [name]   DOB: Jul 12, 1986  MR Number: [id number]  Location: [facility] [place], [place] [id number] [facility] [place] [address] Dept: [phone]  Start: 2 PM End: 3 PM  Provider/Observer:     Edgardo Roys PsyD  Chief Complaint:      Chief Complaint  Patient presents with   Anxiety   Depression   Other    Attention and concentration difficulties    Reason For Service:     HAGER STAYNER is a 37 year old female referred for neuropsychological evaluation by her treating psychiatrist Ursula Alert, MD due to ongoing difficulties with attention and concentration in conjunction with a history of anxiety and depressive symptomatology as well.  The patient describes ongoing difficulties with concentrating, impulsiveness and forgetfulness.  The patient reports that she has had attention and concentration issues at least since high school, which is when she first started noticing it herself.  The patient reports she had even more difficulties with these issues during college.  The patient reports that she also started having more issues and difficulties with anxiety and depressive types of symptoms in high school with significant depressive event when she was 74 or 37 years old.  The patient's concern around these attentional issues prompted a referral for neuropsychological evaluation to provide more in-depth diagnostic assessment to aid in treatment planning and psychiatric management going forward.   The patient reports that overall she is doing fairly well medically and has recently lost a significant amount of weight between 50 and 60 pounds after taking [name].  The patient had been diagnosed with insulin resistance and has had previous diagnosis of polycystic ovarian syndrome.   The patient reports that she feels like she has  had attentional issues as long as she can remember and notes that they really started being a problem for her in high school and even more difficulty with attention and concentration in college.  The patient reports that she has difficulty thinking and focusing on 1 task and completing that task..  The patient has a significant past psychiatric history that goes back to high school.  The patient had a major depressive event when she was [age] years old and had a brief inpatient hospitalization after a suicide attempt taking Tylenol that she describes more of a cry for help.  The patient suffered no lasting medical complications and no liver injury or dysfunction.  The patient reports that she has participated in individual counseling/therapy since her 36s.  Patient denies any current suicidal ideation and has had no difficulties with suicide attempt or suicidal ideation since.  The patient's current medication regimen includes Wellbutrin, BuSpar and doxepin which she feels like is an effective and helpful strategy.   The patient reports that she has had some significant issues with psychosocial stressors particularly around the ending of her marriage.  The patient reports that there was very poor communication for the last 2 years of their marriage and after the patient informed her now ex-husband that she wanted a divorce there were more challenges and he started putting up more demands etc.  There are issues with coparenting of their children and ongoing issues around competitive responses.  The patient reports that there are times when she just feels like she is in sensory overload from her kids and has difficulty keeping up with appointments and other responsibilities.   The patient  reports that while she has been improving her sleep pattern and is improving with some medication interventions by Dr. Shea Evans she continues to have a disturbance of sleep.  The patient reports that she knows she may go to bed too  late but often worries about taking the medication she is prescribed for fear that she will still be groggy in the morning.  The patient reports that she will also wake up in the middle of the night and while she goes back to sleep most of the time other times she has difficulty going back to sleep.  The patient reports that there are times in which she overeats but she has successfully lost a considerable amount of weight and has been actively working on her overall health status.   Current stressors include recent divorce and challenges around this divorce including coparenting and some of the financial stressors that this divorce is placed on her including the patient being responsible for child support even though she and her husband have joint custody.  Tests Administered: Comprehensive Attention Battery (CAB) Continuous Performance Test (CPT)  Participation Level:   Active  Participation Quality:  Appropriate      Behavioral Observation:  The patient appeared well-groomed and appropriately dressed. Her manners were polite and appropriate to the situation. She demonstrated a positive attitude toward testing and showed good effort.   Well Groomed, Alert, and Appropriate.   Test Results:   Initially, considerations were made as to the validity of the current objective assessment.  Embedded validity measures including pure reaction time measures and behavioral observations strongly suggest that the patient put forth full effort and tried her hardest throughout the assessment.  There were no indications of either attempts to exaggerate or minimize current symptomatology.  The patient was administered the comprehensive attention battery and the CAB CPT measures.  She was initially administered the auditory/visual pure reaction time measures.  On the visual pure reaction time measure she correctly responded to 50 of 50 targets with no errors of omission.  Patient's average response time was 320 ms  with accuracy scores and response times well within normative expectations.  The patient performed equally well on the pure auditory reaction time measure correctly identifying 50 of 50 targets with an average response time of 369 ms.  Accuracy and response times were within normative expectations.  The patient was then administered the discriminate reaction time test.  On the visual discriminate reaction time measure the patient correctly identified 27 of 35 targets with 2 errors of commission and 8 errors of omission.  While this is an impaired score it does appear that this was primarily due to her being very tentative and interacting and responding to the touch screen interface and does not suggest actual errors of omission but rather response errors.  Patient is average response time was 320 ms which is nearly 1 standard deviation faster than normative expectations.  On the auditory discriminate reaction time measure the patient correctly responded to 34 of 35 targets with no errors of commission and only 1 error of omission.  This is within normative expectations.  Average response time was 575 ms which is within normative expectations.  On the mixed/shift discriminate reaction time measure the patient had more difficulties although this was almost completely related to 1 lapse of attention.  She correctly responded to 23 of 30 targets with 3 errors of commission and 7 errors of omission.  This is a mildly impaired score relative to a normative expectation.  Average response time was 624 ms which is within normal limits.  The patient was then administered the auditory/visual scan reaction time measures.  On the visual, auditory and mixed portions of this measure the patient had 100% accuracy with the exception of 2 errors of omission on the mixed portion.  There were no other areas noted.  Average response times were well within normative expectations on all 3 measures.  The patient was then administered  the auditory/visual encoding task.  The patient performed well on the auditory forwards and backwards as well as the visual forwards and backwards measures producing performances within normative expectations.  In fact, the patient performed very well on the visual encoding test performing between 1 and 1 and half standard deviations better than normative expectations.  Clearly, the patient does well on encoding capacity measures in particular well on visual encoding measures.  The patient was then administered the Stroop interference cancellation test.  This starts out as a focus execute challenge without external auditory distractors.  The first 4 trials are essentially visual scanning and cancellation task and then after the 4 9 interference trials as quickly shifted into a targeted auditory interference trial.  While the patient is told this will happen during the instruction she does not have a chance to practice the interference trials prior to their introduction.  On the first 4 9 interference trials the patient performed quite well performing in the average range and in fact by the fourth 9 interference trial she correctly identified 18 of 18 targets which is a very good score nearly 2 standard deviations above normative expectations.  However, the patient had significant initial difficulty voiding excessive distractibility on the first targeted interference trial.  She correctly responded to 7 of 18 targets when the targeted auditory interference was initially applied.  The patient was able to quickly adjust to this targeted interference and the second through fourth series she correctly identified between 14 and 18 of the targets which is consistent with the 9 interference trials.  Finally, the patient was administered the visual monitor continuous performance measure of the comprehensive attention battery.  This is a 15-minute discriminate reaction time measure that is broken down into five 3-minute  blocks of time for analysis.  The patient showed significant difficulties with sustained attention and concentration on this measure.  For example, on the first 3-minute block of time the patient correctly identified 29 of 30 targets with only 1 error of omission and no missed items.  Her average response time was 483 ms which is within normative expectations.  The patient showed a consistent worsening of performance across time.  On the 3-6-minute mark she had 4 errors of omission and continued to have errors of omission throughout the remainder of the measure.  In fact, on the 12-15-minute mark she had 11 errors of omission and only correctly identified 19 of 30 targets.  This is a significantly impaired score for accuracy.  Her average response time also consistently slowed as a function of time.  For comparison purposes the patient had an average response time of 483 ms on the first 3-minute block of time and progressed and by the 9-12-minute mark she was averaging 805 ms and continued the same pattern into the end of the measure.  This is a more than 300 ms slowing and average response time.  Both the accuracy scores and slowing response time suggest significant deficits with regard to sustaining attention and concentration.  Impression/Diagnosis:   Results  of the current neuropsychological evaluation are consistent with those that would typically be seen with individuals having adult residual attention deficit disorder.  While formal concerns about attentional issues were not present prior to high school and she also developed difficulties with depression and anxiety around that time the patient describes difficulties with attention going back into her early childhood.  The patient describes significant psychosocial stressors type II or depressive symptoms but she also describes significant difficulties with attention and concentration, completing task and organizational skills.  While the patient did not  show patterns consistent with hyperkinetic type behaviors the patient does show clear objective findings that are very consistent with significant attentional deficits most notably around issues with sustaining attention and concentration, lapses of attention and significant difficulties with remaining free from external distractors.  This pattern is typical of individuals who respond well to psychostimulant type medications.  While the patient does have depressive type symptoms these appear to be consistent with significant psychosocial stressors and I do not think that the primary issue or etiological factors around her attentional deficits are due to depression.  She does not show attention or executive functioning deficits consistent with those that would be typically associated with depressive symptoms as the primary culprit.  This pattern is also consistent with individuals that typically respond well to psychostimulant medications.  As far as treatment recommendations I do think that the patient would be appropriate for a trial of psychostimulant medications.  While the patient has a lot of stress and worry as well as difficulties with onset of sleep these issues should be carefully monitored during the initial trial of psychostimulant medications.  I will sit down with the patient and go over the results of the current neuropsychological evaluation with recommendations specifically for her.  As always, the patient should continue to work on maintaining good physical activities, good dietary patterns and continuing to work on improving sleep patterns.  The patient has lost considerable weight recently with more careful monitoring of her dietary patterns and medication interventions for her insulin resistant type symptoms.  Diagnosis:    Adult residual type attention deficit hyperactivity disorder  MDD (major depressive disorder), recurrent episode, mild (HCC)  Insomnia due to mental  condition   _____________________ [name] Skill, Psy.D. Clinical Neuropsychologist

## 2022-11-13 DIAGNOSIS — F331 Major depressive disorder, recurrent, moderate: Secondary | ICD-10-CM | POA: Diagnosis not present

## 2022-11-13 DIAGNOSIS — F4322 Adjustment disorder with anxiety: Secondary | ICD-10-CM | POA: Diagnosis not present

## 2022-11-14 ENCOUNTER — Encounter (INDEPENDENT_AMBULATORY_CARE_PROVIDER_SITE_OTHER): Payer: Self-pay | Admitting: Adult Health

## 2022-11-14 ENCOUNTER — Other Ambulatory Visit (INDEPENDENT_AMBULATORY_CARE_PROVIDER_SITE_OTHER): Payer: Self-pay | Admitting: Adult Health

## 2022-11-14 ENCOUNTER — Other Ambulatory Visit (HOSPITAL_COMMUNITY): Payer: Self-pay

## 2022-11-14 MED ORDER — WEGOVY 2.4 MG/0.75ML ~~LOC~~ SOAJ
2.4000 mg | SUBCUTANEOUS | 0 refills | Status: DC
  Filled 2022-11-14: qty 3, 28d supply, fill #0

## 2022-11-27 ENCOUNTER — Encounter (INDEPENDENT_AMBULATORY_CARE_PROVIDER_SITE_OTHER): Payer: Self-pay | Admitting: Adult Health

## 2022-11-27 ENCOUNTER — Other Ambulatory Visit (HOSPITAL_COMMUNITY): Payer: Self-pay

## 2022-11-27 ENCOUNTER — Ambulatory Visit (INDEPENDENT_AMBULATORY_CARE_PROVIDER_SITE_OTHER): Payer: 59 | Admitting: Adult Health

## 2022-11-27 VITALS — BP 124/79 | HR 77 | Temp 98.2°F | Ht 70.0 in | Wt 242.0 lb

## 2022-11-27 DIAGNOSIS — E88819 Insulin resistance, unspecified: Secondary | ICD-10-CM

## 2022-11-27 DIAGNOSIS — Z6834 Body mass index (BMI) 34.0-34.9, adult: Secondary | ICD-10-CM

## 2022-11-27 DIAGNOSIS — E559 Vitamin D deficiency, unspecified: Secondary | ICD-10-CM

## 2022-11-27 DIAGNOSIS — E669 Obesity, unspecified: Secondary | ICD-10-CM | POA: Diagnosis not present

## 2022-11-27 MED ORDER — VITAMIN D (ERGOCALCIFEROL) 1.25 MG (50000 UNIT) PO CAPS
50000.0000 [IU] | ORAL_CAPSULE | ORAL | 0 refills | Status: DC
  Filled 2022-11-27: qty 4, 28d supply, fill #0

## 2022-11-27 MED ORDER — ZEPBOUND 7.5 MG/0.5ML ~~LOC~~ SOAJ
7.5000 mg | SUBCUTANEOUS | 0 refills | Status: DC
  Filled 2022-11-27: qty 2, 28d supply, fill #0

## 2022-11-27 NOTE — Progress Notes (Addendum)
WEIGHT SUMMARY AND BIOMETRICS  Vitals Temp: 98.2 F (36.8 C) BP: 124/79 Pulse Rate: 77 SpO2: 100 %   Anthropometric Measurements Height: 5\' 10"  (1.778 m) Weight: 242 lb (109.8 kg) BMI (Calculated): 34.72 Weight at Last Visit: 237lb Weight Lost Since Last Visit: 0 Weight Gained Since Last Visit: 5lb Starting Weight: 273lb Total Weight Loss (lbs): 31 lb (14.1 kg)   Body Composition  Body Fat %: 39.5 % Fat Mass (lbs): 95.6 lbs Muscle Mass (lbs): 139.2 lbs Total Body Water (lbs): 96.6 lbs Visceral Fat Rating : 9   Other Clinical Data Fasting: No Labs: No Today's Visit #: 58 Starting Date: 01/09/18    Chief Complaint:   OBESITY Rhonda [name] is here to discuss her progress with her obesity treatment plan. She is on the the Category 2 Plan and states she is following her eating plan approximately 60 % of the time.  She states she is not currently exercising.   Interim History:  Rhonda [name] [name] recently signed up for bi-weekly [facility] Classes- Tuesday/Thursday 5:30-6:30pm She would also like to eventually incorporate in a weekly morning Spin class.  She has a few more weeks of Wegovy 2.4mg  injection therapy- denies mass in neck, dysphagia, dyspepsia, persistent hoarseness, abdominal pain, or N/V/C  She would still like to convert back to [facility], as the GIP-/GLP-1 therapy was far superior in control of metabolic conditions and appetite.  Discussed in detail her lowest weight recorded during her journey at [facility]: 01/31/2022- 217 lbs- participating in Whole 30 porgram 05/01/2022- 225 lbs - She is currently on [name] 12.5mg  once weekly injection. 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  Subjective:   1. Insulin resistance  Latest Reference Range & Units 07/19/20 08:30 03/07/21 08:43 08/23/21 08:53 04/02/22 11:04 10/04/22 09:04  INSULIN 2.6 - 24.9 uIU/mL 34.0 (H) 20.8 7.0 17.1 10.8  (H): Data is abnormally high She has a  few more weeks of Wegovy 2.4mg  injection therapy- denies mass in neck, dysphagia, dyspepsia, persistent hoarseness, abdominal pain, or N/V/C  She would still like to convert back to [facility], as the GIP-/GLP-1 therapy was far superior in control of metabolic conditions and appetite.  2. Vitamin D deficiency  Latest Reference Range & Units 07/19/20 08:30 03/07/21 08:43 04/02/22 11:04 10/04/22 09:04  Vitamin D, 25-Hydroxy 30.0 - 100.0 ng/mL 44.3 70.0 49.5 38.5  She is on weekly Ergocalciferol- denies N/V/Muscle Weakness   Assessment/Plan:   1. Insulin resistance Increase compliance of Cat 2 Meal Plan to at least 80% Increase regular exercise  2. Vitamin D deficiency Refill Ergocalciferol 50,000 IU once week Disp 4 RF 0   3. Obesity, current BMI 34.01 Start Zepbound 7.5 once week Disp 9 ml RF 0 SAVINGS CARD COMPLETED IN OFFICE IF ABLE TO OBTAIN ZEPBOUND- STOP WEGOVY PT VERBALIZED UNDERSTANDING AND AGREEMENT  Rhonda [name] is currently in the action stage of change. As such, her goal is to continue with weight loss efforts. She has agreed to the Category 2 Plan.   Exercise goals: For substantial health benefits, adults should do at least 150 minutes (2 hours and 30 minutes) a week of moderate-intensity, or 75 minutes (1 hour and 15 minutes) a week of vigorous-intensity aerobic physical activity, or an equivalent combination of moderate- and vigorous-intensity aerobic activity. Aerobic activity should be performed in episodes of at least 10 minutes, and preferably, it should be spread throughout the week.  Behavioral modification strategies: increasing lean protein intake, decreasing simple carbohydrates,  increasing vegetables, increasing water intake, decreasing eating out, no skipping meals, meal planning and cooking strategies, keeping healthy foods in the home, better snacking choices, and planning for success.  Rhonda [name] has agreed to follow-up with our clinic in 4 weeks. She was  informed of the importance of frequent follow-up visits to maximize her success with intensive lifestyle modifications for her multiple health conditions.   Objective:   Blood pressure 124/79, pulse 77, temperature 98.2 F (36.8 C), height 5\' 10"  (1.778 m), weight 242 lb (109.8 kg), SpO2 100 %. Body mass index is 34.72 kg/m.  General: Cooperative, alert, well developed, in no acute distress. HEENT: Conjunctivae and lids unremarkable. Cardiovascular: Regular rhythm.  Lungs: Normal work of breathing. Neurologic: No focal deficits.   Lab Results  Component Value Date   CREATININE 0.94 10/04/2022   BUN 9 10/04/2022   NA 139 10/04/2022   K 4.2 10/04/2022   CL 101 10/04/2022   CO2 22 10/04/2022   Lab Results  Component Value Date   ALT 27 10/04/2022   AST 28 10/04/2022   ALKPHOS 66 10/04/2022   BILITOT 0.6 10/04/2022   Lab Results  Component Value Date   HGBA1C 5.0 10/04/2022   HGBA1C 4.9 04/02/2022   HGBA1C 5.0 08/23/2021   HGBA1C 5.0 03/07/2021   HGBA1C 5.0 07/19/2020   Lab Results  Component Value Date   INSULIN 10.8 10/04/2022   INSULIN 17.1 04/02/2022   INSULIN 7.0 08/23/2021   INSULIN 20.8 03/07/2021   INSULIN 34.0 (H) 07/19/2020   Lab Results  Component Value Date   TSH 2.280 03/07/2021   Lab Results  Component Value Date   CHOL 143 04/02/2022   HDL 54 04/02/2022   LDLCALC 75 04/02/2022   TRIG 68 04/02/2022   CHOLHDL 2.6 04/02/2022   Lab Results  Component Value Date   VD25OH 38.5 10/04/2022   VD25OH 49.5 04/02/2022   VD25OH 70.0 03/07/2021   Lab Results  Component Value Date   WBC 4.7 01/09/2018   HGB 14.5 01/09/2018   HCT 42.1 01/09/2018   MCV 89 01/09/2018   PLT 195 09/20/2016   No results found for: "IRON", "TIBC", "FERRITIN"  Attestation Statements:   Reviewed by clinician on day of visit: allergies, medications, problem list, medical history, surgical history, family history, social history, and previous encounter notes.  I have  reviewed the above documentation for accuracy and completeness, and I agree with the above. -  Jakyah Bradby d. Aerie Donica, NP-C

## 2022-11-29 ENCOUNTER — Other Ambulatory Visit (HOSPITAL_COMMUNITY): Payer: Self-pay

## 2022-11-29 ENCOUNTER — Other Ambulatory Visit: Payer: Self-pay

## 2022-11-29 ENCOUNTER — Encounter (INDEPENDENT_AMBULATORY_CARE_PROVIDER_SITE_OTHER): Payer: Self-pay | Admitting: Adult Health

## 2022-12-03 ENCOUNTER — Other Ambulatory Visit (INDEPENDENT_AMBULATORY_CARE_PROVIDER_SITE_OTHER): Payer: Self-pay

## 2022-12-03 ENCOUNTER — Telehealth (INDEPENDENT_AMBULATORY_CARE_PROVIDER_SITE_OTHER): Payer: Self-pay | Admitting: Adult Health

## 2022-12-03 NOTE — Telephone Encounter (Signed)
PA submitted for Zepbound, waiting on a determination. 12/03/2022

## 2022-12-06 ENCOUNTER — Other Ambulatory Visit (INDEPENDENT_AMBULATORY_CARE_PROVIDER_SITE_OTHER): Payer: Self-pay | Admitting: Adult Health

## 2022-12-06 ENCOUNTER — Other Ambulatory Visit (HOSPITAL_COMMUNITY): Payer: Self-pay

## 2022-12-06 MED ORDER — WEGOVY 2.4 MG/0.75ML ~~LOC~~ SOAJ
2.4000 mg | SUBCUTANEOUS | 0 refills | Status: DC
  Filled 2022-12-06 – 2022-12-13 (×2): qty 3, 28d supply, fill #0

## 2022-12-06 NOTE — Telephone Encounter (Signed)
PA for Zepbound was denied. Fax received stated your pharmacy benefits does not cover this under pharmacy benefits. 12/06/22 Letter sent to scanning dept.

## 2022-12-08 DIAGNOSIS — M25562 Pain in left knee: Secondary | ICD-10-CM | POA: Diagnosis not present

## 2022-12-09 ENCOUNTER — Other Ambulatory Visit: Payer: Self-pay | Admitting: Psychiatry

## 2022-12-09 DIAGNOSIS — F5105 Insomnia due to other mental disorder: Secondary | ICD-10-CM

## 2022-12-10 ENCOUNTER — Other Ambulatory Visit: Payer: Self-pay

## 2022-12-10 MED ORDER — BUPROPION HCL ER (XL) 300 MG PO TB24
300.0000 mg | ORAL_TABLET | Freq: Every day | ORAL | 0 refills | Status: DC
Start: 2022-12-10 — End: 2023-01-23
  Filled 2022-12-10: qty 90, 90d supply, fill #0

## 2022-12-11 DIAGNOSIS — F331 Major depressive disorder, recurrent, moderate: Secondary | ICD-10-CM | POA: Diagnosis not present

## 2022-12-11 DIAGNOSIS — F4322 Adjustment disorder with anxiety: Secondary | ICD-10-CM | POA: Diagnosis not present

## 2022-12-12 ENCOUNTER — Telehealth: Payer: Self-pay | Admitting: Psychiatry

## 2022-12-12 DIAGNOSIS — F5105 Insomnia due to other mental disorder: Secondary | ICD-10-CM

## 2022-12-12 MED ORDER — HYDROXYZINE PAMOATE 25 MG PO CAPS
25.0000 mg | ORAL_CAPSULE | Freq: Every evening | ORAL | 0 refills | Status: AC | PRN
Start: 2022-12-12 — End: ?
  Filled 2022-12-12: qty 180, 90d supply, fill #0

## 2022-12-12 NOTE — Telephone Encounter (Signed)
Contacted patient back, currently going through child custody, sleep issues likely due to anxiety.  Interested in a trial of hydroxyzine again to go with the doxepin.  Will restart hydroxyzine 25-50 mg at bedtime as needed.

## 2022-12-12 NOTE — Telephone Encounter (Signed)
Patient had questions about her doxepin medication. Patient stated she is not getting any sleep. Patient also stated she needs a refill on one of her current medications-Please Advise

## 2022-12-13 ENCOUNTER — Other Ambulatory Visit: Payer: Self-pay

## 2022-12-13 ENCOUNTER — Other Ambulatory Visit (HOSPITAL_COMMUNITY): Payer: Self-pay

## 2022-12-13 ENCOUNTER — Telehealth: Payer: Self-pay | Admitting: Psychiatry

## 2022-12-13 ENCOUNTER — Other Ambulatory Visit: Payer: Self-pay | Admitting: Psychiatry

## 2022-12-13 DIAGNOSIS — F5105 Insomnia due to other mental disorder: Secondary | ICD-10-CM

## 2022-12-13 NOTE — Telephone Encounter (Signed)
Please call patient to let her know Bupropion XL 300 mg daily has been sent to [facility] outpatient pharmacy.

## 2022-12-14 NOTE — Telephone Encounter (Signed)
Pt.notified

## 2022-12-25 ENCOUNTER — Ambulatory Visit (INDEPENDENT_AMBULATORY_CARE_PROVIDER_SITE_OTHER): Payer: 59 | Admitting: Adult Health

## 2022-12-26 ENCOUNTER — Encounter (INDEPENDENT_AMBULATORY_CARE_PROVIDER_SITE_OTHER): Payer: Self-pay | Admitting: Adult Health

## 2022-12-26 ENCOUNTER — Ambulatory Visit (INDEPENDENT_AMBULATORY_CARE_PROVIDER_SITE_OTHER): Payer: 59 | Admitting: Adult Health

## 2022-12-26 ENCOUNTER — Other Ambulatory Visit (HOSPITAL_COMMUNITY): Payer: Self-pay

## 2022-12-26 VITALS — BP 131/82 | HR 74 | Temp 98.0°F | Ht 70.0 in | Wt 245.0 lb

## 2022-12-26 DIAGNOSIS — E88819 Insulin resistance, unspecified: Secondary | ICD-10-CM

## 2022-12-26 DIAGNOSIS — E559 Vitamin D deficiency, unspecified: Secondary | ICD-10-CM

## 2022-12-26 DIAGNOSIS — Z6835 Body mass index (BMI) 35.0-35.9, adult: Secondary | ICD-10-CM

## 2022-12-26 DIAGNOSIS — E669 Obesity, unspecified: Secondary | ICD-10-CM

## 2022-12-26 MED ORDER — VITAMIN D (ERGOCALCIFEROL) 1.25 MG (50000 UNIT) PO CAPS
50000.0000 [IU] | ORAL_CAPSULE | ORAL | 0 refills | Status: DC
  Filled 2022-12-26 – 2022-12-31 (×2): qty 4, 28d supply, fill #0

## 2022-12-26 NOTE — Progress Notes (Signed)
WEIGHT SUMMARY AND BIOMETRICS  Vitals Temp: 98 F (36.7 C) BP: 131/82 Pulse Rate: 74 SpO2: 99 %   Anthropometric Measurements Height: 5\' 10"  (1.778 m) Weight: 245 lb (111.1 kg) BMI (Calculated): 35.15 Weight at Last Visit: 242lb Weight Lost Since Last Visit: 0 Weight Gained Since Last Visit: 3lb Starting Weight: 273lb Total Weight Loss (lbs): 28 lb (12.7 kg)   Body Composition  Body Fat %: 39.5 % Fat Mass (lbs): 96.8 lbs Muscle Mass (lbs): 140.8 lbs Total Body Water (lbs): 96.4 lbs Visceral Fat Rating : 9   Other Clinical Data Fasting: no Labs: no Today's Visit #: 24 Starting Date: 01/09/18    Chief Complaint:   OBESITY Rhonda [name] is here to discuss her progress with her obesity treatment plan. She is on the the Category 2 Plan and states she is following her eating plan approximately 75 % of the time. She states she is exercising HITT 60 minutes 2 times per week.   Interim History:  Rhonda [name] has started HITT twice weekly classes and feel like she is building muscle! She reports increased compliance on following eating plan and reduced eating out.  She completed Wegovy 2.4mg  injection therapy- denies mass in neck, dysphagia, dyspepsia, persistent hoarseness, abdominal pain, or N/V/C  She was unable to obtain Zepbound therapy- cost prohibitive.  Subjective:   1. Insulin resistance  Latest Reference Range & Units 08/23/21 08:53 04/02/22 11:04 10/04/22 09:04  INSULIN 2.6 - 24.9 uIU/mL 7.0 17.1 10.8   She completed Wegovy 2.4mg  injection therapy- denies mass in neck, dysphagia, dyspepsia, persistent hoarseness, abdominal pain, or N/V/C  She was unable to obtain Zepbound therapy- cost prohibitive.  2. Vitamin D deficiency  Latest Reference Range & Units 03/07/21 08:43 04/02/22 11:04 10/04/22 09:04  Vitamin D, 25-Hydroxy 30.0 - 100.0 ng/mL 70.0 49.5 38.5  She is on weekly Ergocalciferol- denies N/V/Muscle Weakness  Assessment/Plan:   1. Insulin  resistance Check Labs at next OV  2. Vitamin D deficiency Refill Vitamin D, Ergocalciferol, (DRISDOL) 1.25 MG (50000 UNIT) CAPS capsule Take 1 capsule (50,000 Units total) by mouth every 7 (seven) days. Dispense: 4 capsule, Refills: 0 of 0 remaining  Check Labs at next OV  3. Obesity, current BMI 35.15  Rhonda [name] is currently in the action stage of change. As such, her goal is to continue with weight loss efforts. She has agreed to the Category 2 Plan.   Exercise goals: For substantial health benefits, adults should do at least 150 minutes (2 hours and 30 minutes) a week of moderate-intensity, or 75 minutes (1 hour and 15 minutes) a week of vigorous-intensity aerobic physical activity, or an equivalent combination of moderate- and vigorous-intensity aerobic activity. Aerobic activity should be performed in episodes of at least 10 minutes, and preferably, it should be spread throughout the week.  Behavioral modification strategies: increasing lean protein intake, decreasing simple carbohydrates, increasing vegetables, increasing water intake, no skipping meals, meal planning and cooking strategies, and planning for success.  Rhonda [name] has agreed to follow-up with our clinic in 4 weeks. She was informed of the importance of frequent follow-up visits to maximize her success with intensive lifestyle modifications for her multiple health conditions.   Check Fasting Labs at next OV- include Thyroid Panel  Objective:   Blood pressure 131/82, pulse 74, temperature 98 F (36.7 C), height 5\' 10"  (1.778 m), weight 245 lb (111.1 kg), SpO2 99 %. Body mass index is 35.15 kg/m.  General: Cooperative, alert, well developed, in no acute  distress. HEENT: Conjunctivae and lids unremarkable. Cardiovascular: Regular rhythm.  Lungs: Normal work of breathing. Neurologic: No focal deficits.   Lab Results  Component Value Date   CREATININE 0.94 10/04/2022   BUN 9 10/04/2022   NA 139 10/04/2022   K 4.2  10/04/2022   CL 101 10/04/2022   CO2 22 10/04/2022   Lab Results  Component Value Date   ALT 27 10/04/2022   AST 28 10/04/2022   ALKPHOS 66 10/04/2022   BILITOT 0.6 10/04/2022   Lab Results  Component Value Date   HGBA1C 5.0 10/04/2022   HGBA1C 4.9 04/02/2022   HGBA1C 5.0 08/23/2021   HGBA1C 5.0 03/07/2021   HGBA1C 5.0 07/19/2020   Lab Results  Component Value Date   INSULIN 10.8 10/04/2022   INSULIN 17.1 04/02/2022   INSULIN 7.0 08/23/2021   INSULIN 20.8 03/07/2021   INSULIN 34.0 (H) 07/19/2020   Lab Results  Component Value Date   TSH 2.280 03/07/2021   Lab Results  Component Value Date   CHOL 143 04/02/2022   HDL 54 04/02/2022   LDLCALC 75 04/02/2022   TRIG 68 04/02/2022   CHOLHDL 2.6 04/02/2022   Lab Results  Component Value Date   VD25OH 38.5 10/04/2022   VD25OH 49.5 04/02/2022   VD25OH 70.0 03/07/2021   Lab Results  Component Value Date   WBC 4.7 01/09/2018   HGB 14.5 01/09/2018   HCT 42.1 01/09/2018   MCV 89 01/09/2018   PLT 195 09/20/2016   No results found for: "IRON", "TIBC", "FERRITIN"  Attestation Statements:   Reviewed by clinician on day of visit: allergies, medications, problem list, medical history, surgical history, family history, social history, and previous encounter notes.  I have reviewed the above documentation for accuracy and completeness, and I agree with the above. -  Gretel Cantu d. Margrit Minner, NP-C

## 2022-12-27 DIAGNOSIS — F331 Major depressive disorder, recurrent, moderate: Secondary | ICD-10-CM | POA: Diagnosis not present

## 2022-12-27 DIAGNOSIS — F4322 Adjustment disorder with anxiety: Secondary | ICD-10-CM | POA: Diagnosis not present

## 2022-12-31 ENCOUNTER — Other Ambulatory Visit (HOSPITAL_COMMUNITY): Payer: Self-pay

## 2022-12-31 ENCOUNTER — Other Ambulatory Visit: Payer: Self-pay

## 2023-01-08 ENCOUNTER — Encounter: Payer: 59 | Attending: Psychology | Admitting: Psychology

## 2023-01-08 DIAGNOSIS — F330 Major depressive disorder, recurrent, mild: Secondary | ICD-10-CM | POA: Diagnosis not present

## 2023-01-08 DIAGNOSIS — F5105 Insomnia due to other mental disorder: Secondary | ICD-10-CM | POA: Insufficient documentation

## 2023-01-08 DIAGNOSIS — F908 Attention-deficit hyperactivity disorder, other type: Secondary | ICD-10-CM | POA: Insufficient documentation

## 2023-01-09 ENCOUNTER — Encounter: Payer: Self-pay | Admitting: Psychology

## 2023-01-09 NOTE — Progress Notes (Signed)
Neuropsychological Evaluation   Patient:  Rhonda [name]   DOB: May 29, 1986  MR Number: [id number]  Location: [facility] [address] [id number] [facility] [place] [address] Dept: [phone]  Start: 10 AM End: 11 AM  Today I provided feedback regarding the results of the recent neuropsychological evaluation.  The patient and myself were present for this visit and it was 1 hour in duration.  We reviewed the results of the evaluation including diagnostic considerations and objective neuropsychological test data as well as recommendations.  I provided the patient with a copy of her evaluation and instructed her how she could also find it in my chart.  I will include the reason for service and summary from the evaluation below for convenience.  The entire neuropsychological evaluation can be found in the patient's EMR dated 08/23/2022 with the history and clinical data available in her EMR in the visit note dated 07/19/2022.  Provider/Observer:     Hershal Coria PsyD  Chief Complaint:      Chief Complaint  Patient presents with   Other    Attention and concentration difficulties   ADD   Anxiety   Depression    Reason For Service:     AIYONA [name] is a 37 year old female referred for neuropsychological evaluation by her treating psychiatrist Jomarie Longs, MD due to ongoing difficulties with attention and concentration in conjunction with a history of anxiety and depressive symptomatology as well.  The patient describes ongoing difficulties with concentrating, impulsiveness and forgetfulness.  The patient reports that she has had attention and concentration issues at least since high school, which is when she first started noticing it herself.  The patient reports she had even more difficulties with these issues during college.  The patient reports that she also started having  more issues and difficulties with anxiety and depressive types of symptoms in high school with significant depressive event when she was 39 or 37 years old.  The patient's concern around these attentional issues prompted a referral for neuropsychological evaluation to provide more in-depth diagnostic assessment to aid in treatment planning and psychiatric management going forward.   The patient reports that overall she is doing fairly well medically and has recently lost a significant amount of weight between 50 and 60 pounds after taking [name].  The patient had been diagnosed with insulin resistance and has had previous diagnosis of polycystic ovarian syndrome.   The patient reports that she feels like she has had attentional issues as long as she can remember and notes that they really started being a problem for her in high school and even more difficulty with attention and concentration in college.  The patient reports that she has difficulty thinking and focusing on 1 task and completing that task..  The patient has a significant past psychiatric history that goes back to high school.  The patient had a major depressive event when she was [age] years old and had a brief inpatient hospitalization after a suicide attempt taking Tylenol that she describes more of a cry for help.  The patient suffered no lasting medical complications and no liver injury or dysfunction.  The patient reports that she has participated in individual counseling/therapy since her 46s.  Patient denies any current suicidal ideation and has had no difficulties with suicide attempt or suicidal ideation since.  The patient's current medication regimen includes Wellbutrin, BuSpar and doxepin which she feels  like is an effective and helpful strategy.   The patient reports that she has had some significant issues with psychosocial stressors particularly around the ending of her marriage.  The patient reports that there was very poor  communication for the last 2 years of their marriage and after the patient informed her now ex-husband that she wanted a divorce there were more challenges and he started putting up more demands etc.  There are issues with coparenting of their children and ongoing issues around competitive responses.  The patient reports that there are times when she just feels like she is in sensory overload from her kids and has difficulty keeping up with appointments and other responsibilities.   The patient reports that while she has been improving her sleep pattern and is improving with some medication interventions by Dr. Elna Breslow she continues to have a disturbance of sleep.  The patient reports that she knows she may go to bed too late but often worries about taking the medication she is prescribed for fear that she will still be groggy in the morning.  The patient reports that she will also wake up in the middle of the night and while she goes back to sleep most of the time other times she has difficulty going back to sleep.  The patient reports that there are times in which she overeats but she has successfully lost a considerable amount of weight and has been actively working on her overall health status.   Current stressors include recent divorce and challenges around this divorce including coparenting and some of the financial stressors that this divorce is placed on her including the patient being responsible for child support even though she and her husband have joint custody.   Impression/Diagnosis:   Results of the current neuropsychological evaluation are consistent with those that would typically be seen with individuals having adult residual attention deficit disorder.  While formal concerns about attentional issues were not present prior to high school and she also developed difficulties with depression and anxiety around that time the patient describes difficulties with attention going back into her early  childhood.  The patient describes significant psychosocial stressors type II or depressive symptoms but she also describes significant difficulties with attention and concentration, completing task and organizational skills.  While the patient did not show patterns consistent with hyperkinetic type behaviors the patient does show clear objective findings that are very consistent with significant attentional deficits most notably around issues with sustaining attention and concentration, lapses of attention and significant difficulties with remaining free from external distractors.  This pattern is typical of individuals who respond well to psychostimulant type medications.  While the patient does have depressive type symptoms these appear to be consistent with significant psychosocial stressors and I do not think that the primary issue or etiological factors around her attentional deficits are due to depression.  She does not show attention or executive functioning deficits consistent with those that would be typically associated with depressive symptoms as the primary culprit.  This pattern is also consistent with individuals that typically respond well to psychostimulant medications.  As far as treatment recommendations I do think that the patient would be appropriate for a trial of psychostimulant medications.  While the patient has a lot of stress and worry as well as difficulties with onset of sleep these issues should be carefully monitored during the initial trial of psychostimulant medications.  I will sit down with the patient and go over the results of  the current neuropsychological evaluation with recommendations specifically for her.  As always, the patient should continue to work on maintaining good physical activities, good dietary patterns and continuing to work on improving sleep patterns.  The patient has lost considerable weight recently with more careful monitoring of her dietary patterns and  medication interventions for her insulin resistant type symptoms.  Diagnosis:    Adult residual type attention deficit hyperactivity disorder  MDD (major depressive disorder), recurrent episode, mild (HCC)  Insomnia due to mental condition   _____________________ Arley Phenix, Psy.D. Clinical Neuropsychologist

## 2023-01-18 ENCOUNTER — Other Ambulatory Visit (HOSPITAL_COMMUNITY): Payer: Self-pay

## 2023-01-22 DIAGNOSIS — H5213 Myopia, bilateral: Secondary | ICD-10-CM | POA: Diagnosis not present

## 2023-01-22 DIAGNOSIS — H52222 Regular astigmatism, left eye: Secondary | ICD-10-CM | POA: Diagnosis not present

## 2023-01-23 ENCOUNTER — Other Ambulatory Visit
Admission: RE | Admit: 2023-01-23 | Discharge: 2023-01-23 | Disposition: A | Discharge status: Home / Self Care | Payer: 59 | Attending: Psychiatry | Admitting: Psychiatry

## 2023-01-23 ENCOUNTER — Ambulatory Visit (INDEPENDENT_AMBULATORY_CARE_PROVIDER_SITE_OTHER): Payer: 59 | Admitting: Psychiatry

## 2023-01-23 ENCOUNTER — Encounter: Payer: Self-pay | Admitting: Psychiatry

## 2023-01-23 ENCOUNTER — Other Ambulatory Visit: Payer: Self-pay

## 2023-01-23 VITALS — BP 113/73 | HR 76 | Temp 98.0°F | Ht 70.0 in | Wt 250.0 lb

## 2023-01-23 DIAGNOSIS — F411 Generalized anxiety disorder: Secondary | ICD-10-CM

## 2023-01-23 DIAGNOSIS — F331 Major depressive disorder, recurrent, moderate: Secondary | ICD-10-CM

## 2023-01-23 DIAGNOSIS — Z79899 Other long term (current) drug therapy: Secondary | ICD-10-CM | POA: Diagnosis not present

## 2023-01-23 DIAGNOSIS — F5105 Insomnia due to other mental disorder: Secondary | ICD-10-CM | POA: Diagnosis not present

## 2023-01-23 DIAGNOSIS — F900 Attention-deficit hyperactivity disorder, predominantly inattentive type: Secondary | ICD-10-CM

## 2023-01-23 MED ORDER — ZOLPIDEM TARTRATE 5 MG PO TABS
5.0000 mg | ORAL_TABLET | Freq: Every evening | ORAL | 0 refills | Status: DC | PRN
Start: 2023-01-23 — End: 2023-02-19
  Filled 2023-01-23 – 2023-01-24 (×2): qty 15, 15d supply, fill #0

## 2023-01-23 MED ORDER — BUPROPION HCL ER (XL) 150 MG PO TB24
150.0000 mg | ORAL_TABLET | Freq: Every day | ORAL | 0 refills | Status: DC
Start: 2023-01-23 — End: 2023-03-18
  Filled 2023-01-23 – 2023-01-24 (×2): qty 90, 90d supply, fill #0

## 2023-01-23 NOTE — Progress Notes (Unsigned)
BH MD OP Progress Note  01/23/2023 6:02 PM Rhonda [name]  MRN:  [id number]  Chief Complaint:  Chief Complaint  Patient presents with   Follow-up   Depression   Anxiety   ADD   Medication Refill   HPI: Rhonda [name] is a 37 year old Caucasian female, divorced, employed, lives in [place], has a history of depression, anxiety was evaluated in office today.  Patient today reports she is currently struggling with multiple situational stressors.  She reports she is going through custody battle over children with her ex-husband.  That has been extremely stressful.  She does have upcoming court hearing in July.  Patient reports she is trying to get primary custody of her children since her ex-husband moved 50 minutes away and it has been hard for her and the children to have any kind of routine and structure with the current custody schedule.  She reports she has has been having a lot of anxiety symptoms including feeling on edge, racing thoughts, sleep problems.  Patient reports she has difficulty falling asleep as well as sleep is interrupted.  She tried doxepin along with the hydroxyzine as discussed recently, however that has been making her extremely groggy the next day.  She hence can only take it on weekends.  Patient had ADHD testing completed, reviewed and discussed the report with patient.  Patient meets criteria for adult residual attention deficit disorder.  Patient is interested in trial of stimulants.  Patient denies any suicidality, homicidality or perceptual disturbances.  Currently compliant on medications like Wellbutrin, BuSpar.  Patient denies side effects to medications other than as noted above, grogginess with the combination of doxepin and hydroxyzine.  Patient denies any other concerns today.  Visit Diagnosis:    ICD-10-CM   1. MDD (major depressive disorder), recurrent episode, moderate (HCC)  F33.1 buPROPion (WELLBUTRIN XL) 150 MG 24 hr tablet    2. GAD  (generalized anxiety disorder)  F41.1 buPROPion (WELLBUTRIN XL) 150 MG 24 hr tablet    3. Insomnia due to mental condition  F51.05 zolpidem (AMBIEN) 5 MG tablet   Anxiety    4. Attention deficit hyperactivity disorder (ADHD), predominantly inattentive type  F90.0 Urine drugs of abuse scrn w alc, routine (Ref Lab)    5. High risk medication use  Z79.899       Past Psychiatric History: I have reviewed past psychiatric history from progress note on 11/05/2018.  Past Medical History:  Past Medical History:  Diagnosis Date   Depression    treated   PCOS (polycystic ovarian syndrome) 2008   Plantar fasciitis     Past Surgical History:  Procedure Laterality Date   CHOLECYSTECTOMY      Family Psychiatric History: I have reviewed family psychiatric history from progress note on 11/05/2018.  Family History:  Family History  Problem Relation Age of Onset   High blood pressure Mother    Depression Father    Liver disease Father    Alcoholism Father    Alcohol abuse Father    Diabetes Paternal Grandmother    Bipolar disorder Maternal Aunt     Social History: I have reviewed social history from progress note on 11/05/2018. Social History   Socioeconomic History   Marital status: Legally Separated    Spouse name: Christiane Ha   Number of children: 2   Years of education: Not on file   Highest education level: Bachelor's degree (e.g., BA, AB, BS)  Occupational History   Occupation: Charity fundraiser  Tobacco Use  Smoking status: Never   Smokeless tobacco: Never  Vaping Use   Vaping Use: Never used  Substance and Sexual Activity   Alcohol use: Yes    Alcohol/week: 1.0 standard drink of alcohol    Types: 1 Glasses of wine per week    Comment: Occasional once a week.   Drug use: No   Sexual activity: Yes    Partners: Male    Birth control/protection: None, I.U.D.  Other Topics Concern   Not on file  Social History Narrative   Not on file   Social Determinants of Health   Financial  Resource Strain: Low Risk  (11/05/2018)   Overall Financial Resource Strain (CARDIA)    Difficulty of Paying Living Expenses: Not hard at all  Food Insecurity: No Food Insecurity (11/05/2018)   Hunger Vital Sign    Worried About Running Out of Food in the Last Year: Never true    Ran Out of Food in the Last Year: Never true  Transportation Needs: No Transportation Needs (11/05/2018)   PRAPARE - Administrator, Civil Service (Medical): No    Lack of Transportation (Non-Medical): No  Physical Activity: Inactive (11/05/2018)   Exercise Vital Sign    Days of Exercise per Week: 0 days    Minutes of Exercise per Session: 0 min  Stress: Stress Concern Present (11/05/2018)   [employer] of Occupational Health - Occupational Stress Questionnaire    Feeling of Stress : Rather much  Social Connections: Unknown (11/05/2018)   Social Connection and Isolation Panel [NHANES]    Frequency of Communication with Friends and Family: Not on file    Frequency of Social Gatherings with Friends and Family: Not on file    Attends Religious Services: More than 4 times per year    Active Member of Clubs or Organizations: No    Attends Banker Meetings: Never    Marital Status: Married    Allergies: No Known Allergies  Metabolic Disorder Labs: Lab Results  Component Value Date   HGBA1C 5.0 10/04/2022   No results found for: "PROLACTIN" Lab Results  Component Value Date   CHOL 143 04/02/2022   TRIG 68 04/02/2022   HDL 54 04/02/2022   CHOLHDL 2.6 04/02/2022   LDLCALC 75 04/02/2022   LDLCALC 76 03/07/2021   Lab Results  Component Value Date   TSH 2.280 03/07/2021   TSH 1.470 01/09/2018    Therapeutic Level Labs: No results found for: "LITHIUM" No results found for: "VALPROATE" No results found for: "CBMZ"  Current Medications: Current Outpatient Medications  Medication Sig Dispense Refill   buPROPion (WELLBUTRIN XL) 150 MG 24 hr tablet Take 1 tablet (150 mg total)  by mouth daily. 90 tablet 0   busPIRone (BUSPAR) 15 MG tablet Take 1 tablet (15 mg total) by mouth 2 (two) times daily. 180 tablet 1   ELDERBERRY PO Take by mouth.     hydrOXYzine (VISTARIL) 25 MG capsule Take 1-2 capsules (25-50 mg total) by mouth at bedtime as needed for sleep 180 capsule 0   levocetirizine (XYZAL) 5 MG tablet Take 5 mg by mouth every evening.     levonorgestrel (MIRENA) 20 MCG/24HR IUD 1 each by Intrauterine route once.     Multiple Vitamin (MULTIVITAMIN) tablet Take 1 tablet by mouth daily.     oseltamivir (TAMIFLU) 75 MG capsule Take 1 capsule (75 mg total) by mouth once daily for 10 days for prophylaxis 10 capsule 0   Probiotic Product (PROBIOTIC-10 PO) Take  by mouth.     propranolol (INDERAL) 20 MG tablet Take 1 tablet (20 mg total) by mouth 2 (two) times daily as needed 60 tablet 1   spironolactone (ALDACTONE) 100 MG tablet Take 1 tablet (100 mg total) by mouth once daily 90 tablet 3   triamcinolone cream (KENALOG) 0.1 % Apply twice daily to hands as needed. 45 g 1   zolpidem (AMBIEN) 5 MG tablet Take 1 tablet (5 mg total) by mouth at bedtime as needed for sleep. 15 tablet 0   Semaglutide-Weight Management (WEGOVY) 2.4 MG/0.75ML SOAJ Inject 2.4 mg into the skin once a week. 3 mL 0   Vitamin D, Ergocalciferol, (DRISDOL) 1.25 MG (50000 UNIT) CAPS capsule Take 1 capsule (50,000 Units total) by mouth every 7 (seven) days. 4 capsule 0   No current facility-administered medications for this visit.     Musculoskeletal: Strength & Muscle Tone: within normal limits Gait & Station: normal Patient leans: N/A  Psychiatric Specialty Exam: Review of Systems  Psychiatric/Behavioral:  Positive for decreased concentration, dysphoric mood and sleep disturbance. The patient is nervous/anxious.     Blood pressure 113/73, pulse 76, temperature 98 F (36.7 C), temperature source Skin, height 5\' 10"  (1.778 m), weight 250 lb (113.4 kg).Body mass index is 35.87 kg/m.  General  Appearance: Casual  Eye Contact:  Fair  Speech:  Clear and Coherent  Volume:  Normal  Mood:  Anxious and Depressed  Affect:  Congruent  Thought Process:  Goal Directed and Descriptions of Associations: Intact  Orientation:  Full (Time, Place, and Person)  Thought Content: Logical   Suicidal Thoughts:  No  Homicidal Thoughts:  No  Memory:  Immediate;   Fair Recent;   Fair Remote;   Fair  Judgement:  Fair  Insight:  Fair  Psychomotor Activity:  Normal  Concentration:  Concentration: Fair and Attention Span: Fair  Recall:  [employer] of Knowledge: Fair  Language: Fair  Akathisia:  No  Handed:  Right  AIMS (if indicated): not done  Assets:  Communication Skills Desire for Improvement Housing Social Support  ADL's:  Intact  Cognition: WNL  Sleep:  Poor   Screenings: GAD-7    Flowsheet Row Office Visit from 01/23/2023 in [facility] Video Visit from 12/07/2020 in [facility]  Total GAD-7 Score 12 1      PHQ2-9    Flowsheet Row Office Visit from 01/23/2023 in [facility] Video Visit from 08/06/2022 in [facility] Office Visit from 01/04/2022 in [facility] Office Visit from 09/14/2021 in [facility] Video Visit from 03/07/2021 in [facility]  PHQ-2 Total Score 3 0 2 0 0  PHQ-9 Total Score 14 -- 10 -- --      Flowsheet Row Office Visit from 01/23/2023 in [facility] Video Visit from 08/06/2022 in [facility] Video Visit from 03/07/2021 in [facility]  C-SSRS RISK CATEGORY No Risk No Risk No Risk        Assessment and Plan: Rhonda [name] is a 37 year old Caucasian female,  divorced, employed, has a history of depression, anxiety, recent diagnosis of ADHD was evaluated in the office today.  Patient with current situational stressors, worsening anxiety, depression, sleep problems as well as ongoing attention and focus problems, will benefit from the following  plan.  Plan MDD-unstable Will reduce Wellbutrin XL to 150 mg p.o. daily.  Dose reduced since patient is currently being started on a stimulant medication.  Patient aware about drug to drug interaction. Will consider increasing the dosage of BuSpar in the future or add another SSRI. Continue L-methylfolate as prescribed  GAD-unstable Continue BuSpar 15 mg p.o. twice daily Hydroxyzine 25 mg p.o. daily as needed for severe anxiety attacks Propranolol 20 mg p.o. twice daily as needed Continue CBT with Ms.Cari Sun  Insomnia-unstable Discontinue doxepin for lack of benefit. Start Ambien 5 mg p.o. nightly as needed Reviewed [place] PMP AWARxE  Adult residual ADHD-unstable Will start Vyvanse 20 mg p.o. daily.  However will need a urine drug screen completed and reviewed prior to sending this to the pharmacy.  Patient provided education about the medication, discussed side effects including cardiac effect in patients.  Patient needs to monitor side effects including heart palpitation, elevated blood pressure, appetite suppression, psychosis, sleep problems. Some time was spent reviewing report per neuropsychologist-Dr. Rodenbough-01/08/2023.  High risk medication use-will order urine drug screen-patient to go to [facility] lab.  Follow-up in clinic in 4 weeks or sooner if needed.  Collaboration of Care: Collaboration of Care: Other I have reviewed notes per Dr. Ignacia Marvel noted above.  Patient/Guardian was advised Release of Information must be obtained prior to any record release in order to collaborate their care with an outside provider. Patient/Guardian was advised if they have not already done so to contact the  registration department to sign all necessary forms in order for [name] to release information regarding their care.   Consent: Patient/Guardian gives verbal consent for treatment and assignment of benefits for services provided during this visit. Patient/Guardian expressed understanding and agreed to proceed.   I have spent atleast 40 minutes face to face with patient today which includes the time spent for preparing to see the patient ( e.g., review of test, records ), obtaining and to review and separately obtained history , ordering medications and test ,psychoeducation and supportive psychotherapy and care coordination,as well as documenting clinical information in electronic health record.  This note was generated in part or whole with voice recognition software. Voice recognition is usually quite accurate but there are transcription errors that can and very often do occur. I apologize for any typographical errors that were not detected and corrected.     Jomarie Longs, MD 01/24/2023, 6:05 PM

## 2023-01-23 NOTE — Patient Instructions (Signed)
Lisdexamfetamine Capsules What is this medication? LISDEXAMFETAMINE (lis DEX am fet a meen) treats attention-deficit hyperactivity disorder (ADHD). It may also be used to treat binge eating disorder. It works by reducing hyperactivity and impulsive behaviors. It belongs to a group of medications called stimulants. This medicine may be used for other purposes; ask your health care provider or pharmacist if you have questions. COMMON BRAND NAME(S): Vyvanse What should I tell my care team before I take this medication? They need to know if you have any of these conditions: Anxiety or panic attacks Circulation problems in fingers and toes Glaucoma Hardening or blockages of the arteries or heart blood vessels Heart disease or a heart defect High blood pressure History of stroke Kidney disease Liver disease Mental health condition Seizures Substance use disorder Suicidal thoughts, plans, or attempt by you or a family member Thyroid disease Tourette's syndrome An unusual or allergic reaction to lisdexamfetamine, other medications, foods, dyes, or preservatives Pregnant or trying to get pregnant Breast-feeding How should I use this medication? Take this medication by mouth. Follow the directions on the prescription label. Swallow the capsules with a drink of water. You may open capsule and add to a glass of water, then drink right away. Take your doses at regular intervals. Do not take your medication more often than directed. Do not suddenly stop your medication. You must gradually reduce the dose, or you may feel withdrawal effects. Ask your care team for advice. A special MedGuide will be given to you by the pharmacist with each prescription and refill. Be sure to read this information carefully each time. Talk to your care team about the use of this medication in children. While this medication may be prescribed for children as young as 15 years of age for selected conditions, precautions do  apply. Overdosage: If you think you have taken too much of this medicine contact a poison control center or emergency room at once. NOTE: This medicine is only for you. Do not share this medicine with others. What if I miss a dose? If you miss a dose, take it as soon as you can. If it is almost time for your next dose, take only that dose. Do not take double or extra doses. What may interact with this medication? Do not take this medication with any of the following: Linezolid MAOIs, such as Marplan, Nardil, and Parnate Methylene blue This medication may also interact with the following: Acetazolamide Alcohol Ascorbic acid Certain medications for depression, anxiety, or other mental health conditions Certain medications for migraines, such as sumatriptan Guanethidine Opioids Reserpine Sodium bicarbonate St. John's wort Thiazide diuretics, such as chlorothiazide Tryptophan This list may not describe all possible interactions. Give your health care provider a list of all the medicines, herbs, non-prescription drugs, or dietary supplements you use. Also tell them if you smoke, drink alcohol, or use illegal drugs. Some items may interact with your medicine. What should I watch for while using this medication? Visit your care team for regular checks on your progress. Tell your care team if your symptoms do not start to get better or if they get worse. This medication requires a new prescription from your care team every time it is filled at the pharmacy. This medication can be abused and cause your brain and body to depend on it after high doses or long term use. Your care team will assess your risk and monitor you closely during treatment. Long term use of this medication may cause your brain and  body to depend on it. You may be able to take breaks from this medication during weekends, holidays, or summer vacations. Talk to your care team about what works for you. If your care team wants you  to stop this medication permanently, the dose may be slowly lowered over time to reduce the risk of side effects. Tell your care team if this medication loses its effects, or if you feel you need to take more than the prescribed amount. Do not change your dose without talking to your care team. Do not take this medication close to bedtime. It may prevent you from sleeping. Loss of appetite is common when starting this medication. Eating small, frequent meals or snacks can help. Talk to your care team if appetite loss persists. Children should have height and weight checked often while taking this medication. Tell your care team right away if you notice unexplained wounds on your fingers and toes while taking this medication. You should also tell your care team if you experience numbness or pain, changes in the skin color, or sensitivity to temperature in your fingers or toes. Contact your care team right away if you have an erection that lasts longer than 4 hours or if it becomes painful. This may be a sign of a serious problem and must be treated right away to prevent permanent damage. What side effects may I notice from receiving this medication? Side effects that you should report to your care team as soon as possible: Allergic reactions--skin rash, itching, hives, swelling of the face, lips, tongue, or throat Heart attack--pain or tightness in the chest, shoulders, arms, or jaw, nausea, shortness of breath, cold or clammy skin, feeling faint or lightheaded Heart rhythm changes--fast or irregular heartbeat, dizziness, feeling faint or lightheaded, chest pain, trouble breathing Increase in blood pressure Irritability, confusion, fast or irregular heartbeat, muscle stiffness, twitching muscles, sweating, high fever, seizure, chills, vomiting, diarrhea, which may be signs of serotonin syndrome Mood and behavior changes--anxiety, nervousness, confusion, hallucinations, irritability, hostility, thoughts  of suicide or self-harm, worsening mood, feelings of depression Prolonged or painful erection Raynaud syndrome--cool, numb, or painful fingers or toes that may change color from pale, to blue, to red Seizures Stroke--sudden numbness or weakness of the face, arm, or leg, trouble speaking, confusion, trouble walking, loss of balance or coordination, dizziness, severe headache, change in vision Side effects that usually do not require medical attention (report these to your care team if they continue or are bothersome): Dry mouth Headache Loss of appetite with weight loss Nausea Stomach pain Trouble sleeping This list may not describe all possible side effects. Call your doctor for medical advice about side effects. You may report side effects to FDA at 1-800-FDA-1088. Where should I keep my medication? Keep out of the reach of children and pets. This medication can be abused. Keep it in a safe place to protect it from theft. Do not share it with anyone. It is only for you. Selling or giving away this medication is dangerous and against the law. Store at room temperature between 15 and 30 degrees C (59 and 86 degrees F). Protect from light. Keep container tightly closed. Get rid of any unused medication after the expiration date. This medication may cause harm and death if it is taken by other adults, children, or pets. It is important to get rid of the medication as soon as you no longer need it or it is expired. You can do this in two ways: Take the medication  to a medication take-back program. Check with your pharmacy or law enforcement to find a location. If you cannot return the medication, check the label or package insert to see if the medication should be thrown out in the garbage or flushed down the toilet. If you are not sure, ask your care team. If it is safe to put it in the trash, take the medication out of the container. Mix the medication with cat litter, dirt, coffee grounds, or other  unwanted substance. Seal the mixture in a bag or container. Put it in the trash. NOTE: This sheet is a summary. It may not cover all possible information. If you have questions about this medicine, talk to your doctor, pharmacist, or health care provider.  2024 Elsevier/Gold Standard (2022-10-14 00:00:00) Zolpidem Tablets What is this medication? ZOLPIDEM (zole PI dem) treats insomnia. It helps you get to sleep faster and stay asleep throughout the night. It is often used for a short period of time. This medicine may be used for other purposes; ask your health care provider or pharmacist if you have questions. COMMON BRAND NAME(S): Ambien What should I tell my care team before I take this medication? They need to know if you have any of these conditions: Depression Frequently drink alcohol Liver disease Lung or breathing disease Myasthenia gravis Sleep apnea Substance use disorder Suicidal thoughts, plans, or attempt by you or a family member Unusual sleep behaviors or activities you do not remember An unusual or allergic reaction to zolpidem, other medications, foods, dyes, or preservatives Pregnant or trying to get pregnant Breastfeeding How should I use this medication? Take this medication by mouth with water. Take it as directed on the prescription label. It is better to take this medication on an empty stomach and only when you are ready for bed. Do not take your medication more often than directed. If you have been taking this medication for several weeks and suddenly stop taking it, you may get unpleasant withdrawal symptoms. Your care team may want to gradually reduce the dose. Do not stop taking this medication on your own. Always follow your care team's advice. A special MedGuide will be given to you by the pharmacist with each prescription and refill. Be sure to read this information carefully each time. Talk to your care team about the use of this medication in children.  Special care may be needed. Overdosage: If you think you have taken too much of this medicine contact a poison control center or emergency room at once. NOTE: This medicine is only for you. Do not share this medicine with others. What if I miss a dose? This does not apply. This medication should only be taken immediately before going to sleep. Do not take double or extra doses. What may interact with this medication? Alcohol Antihistamines for allergy, cough, and cold Certain medications for anxiety or sleep Certain medications for depression, such as amitriptyline, fluoxetine, sertraline Certain medications for fungal infections, such as ketoconazole and itraconazole Certain medications for seizures, such as phenobarbital, primidone Ciprofloxacin Dietary supplements for sleep, such as valerian or kava kava General anesthetics, such as halothane, isoflurane, methoxyflurane, propofol Local anesthetics, such as lidocaine, pramoxine, tetracaine Medications that relax muscles for surgery Opioid medications for pain Phenothiazines, such as chlorpromazine, mesoridazine, prochlorperazine, thioridazine Rifampin This list may not describe all possible interactions. Give your health care provider a list of all the medicines, herbs, non-prescription drugs, or dietary supplements you use. Also tell them if you smoke, drink alcohol, or  use illegal drugs. Some items may interact with your medicine. What should I watch for while using this medication? Visit your care team for regular checks on your progress. Keep a regular sleep schedule by going to bed at about the same time each night. Avoid caffeine-containing drinks in the evening hours. When sleep medications are used every night for more than a few weeks, they may stop working. Talk to your care team if you still have trouble sleeping. You may do unusual sleep behaviors or activities you do not remember the day after taking this medication. Activities  include driving, making or eating food, talking on the phone, sexual activity, or sleep walking. Stop taking this medication and call your care team right away if you find out you have done activities like this. Plan to go to bed and stay in bed for a full night (7 to 8 hours) after you take this medication. You may still be drowsy the morning after taking this medication. This medication may affect your coordination, reaction time, or judgment. Do not drive or operate machinery until you know how this medication affects you. Sit up or stand slowly to reduce the risk of dizzy or fainting spells. If you or your family notice any changes in your behavior, such as new or worsening depression, thoughts of harming yourself, anxiety, other unusual or disturbing thoughts, or memory loss, call your care team right away. After you stop taking this medication, you may have trouble falling asleep. This is called rebound insomnia. This problem usually goes away on its own after 1 or 2 nights. What side effects may I notice from receiving this medication? Side effects that you should report to your care team as soon as possible: Allergic reactions--skin rash, itching, hives, swelling of the face, lips, tongue, or throat Change in vision such as blurry vision, seeing halos around lights, vision loss CNS depression--slow or shallow breathing, shortness of breath, feeling faint, dizziness, confusion, difficulty staying awake Mood and behavior changes--anxiety, nervousness, confusion, hallucinations, irritability, hostility, thoughts of suicide or self-harm, worsening mood, feelings of depression Unusual sleep behaviors or activities you do not remember such as driving, eating, or sexual activity Side effects that usually do not require medical attention (report to your care team if they continue or are bothersome): Diarrhea Dizziness Drowsiness the day after use Headache This list may not describe all possible side  effects. Call your doctor for medical advice about side effects. You may report side effects to FDA at 1-800-FDA-1088. Where should I keep my medication? Keep out of the reach of children and pets. This medication can be abused. Keep your medication in a safe place to protect it from theft. Do not share this medication with anyone. Selling or giving away this medication is dangerous and against the law. Store at room temperature between 20 and 25 degrees C (68 and 77 degrees F). This medication may cause accidental overdose and death if taken by other adults, children, or pets. Mix any unused medication with a substance like cat litter or coffee grounds. Then throw the medication away in a sealed container like a sealed bag or a coffee can with a lid. Do not use the medication after the expiration date. NOTE: This sheet is a summary. It may not cover all possible information. If you have questions about this medicine, talk to your doctor, pharmacist, or health care provider.  2024 Elsevier/Gold Standard (2022-08-05 00:00:00)

## 2023-01-24 ENCOUNTER — Ambulatory Visit (INDEPENDENT_AMBULATORY_CARE_PROVIDER_SITE_OTHER): Payer: 59 | Admitting: Adult Health

## 2023-01-24 ENCOUNTER — Encounter (INDEPENDENT_AMBULATORY_CARE_PROVIDER_SITE_OTHER): Payer: Self-pay | Admitting: Adult Health

## 2023-01-24 ENCOUNTER — Other Ambulatory Visit: Payer: Self-pay

## 2023-01-24 ENCOUNTER — Other Ambulatory Visit (HOSPITAL_COMMUNITY): Payer: Self-pay

## 2023-01-24 VITALS — BP 132/84 | HR 69 | Temp 98.2°F | Ht 70.0 in | Wt 244.0 lb

## 2023-01-24 DIAGNOSIS — E88819 Insulin resistance, unspecified: Secondary | ICD-10-CM | POA: Diagnosis not present

## 2023-01-24 DIAGNOSIS — R7989 Other specified abnormal findings of blood chemistry: Secondary | ICD-10-CM

## 2023-01-24 DIAGNOSIS — E559 Vitamin D deficiency, unspecified: Secondary | ICD-10-CM

## 2023-01-24 DIAGNOSIS — E669 Obesity, unspecified: Secondary | ICD-10-CM | POA: Diagnosis not present

## 2023-01-24 DIAGNOSIS — Z6835 Body mass index (BMI) 35.0-35.9, adult: Secondary | ICD-10-CM

## 2023-01-24 DIAGNOSIS — R5383 Other fatigue: Secondary | ICD-10-CM

## 2023-01-24 MED ORDER — WEGOVY 2.4 MG/0.75ML ~~LOC~~ SOAJ
2.4000 mg | SUBCUTANEOUS | 0 refills | Status: DC
  Filled 2023-01-24: qty 3, 28d supply, fill #0

## 2023-01-24 MED ORDER — VITAMIN D (ERGOCALCIFEROL) 1.25 MG (50000 UNIT) PO CAPS
50000.0000 [IU] | ORAL_CAPSULE | ORAL | 0 refills | Status: DC
  Filled 2023-01-24: qty 4, 28d supply, fill #0

## 2023-01-24 NOTE — Progress Notes (Signed)
WEIGHT SUMMARY AND BIOMETRICS  Vitals Temp: 98.2 F (36.8 C) BP: 132/84 Pulse Rate: 69 SpO2: 99 %   Anthropometric Measurements Height: 5\' 10"  (1.778 m) Weight: 244 lb (110.7 kg) BMI (Calculated): 35.01 Weight at Last Visit: 245 lb Weight Lost Since Last Visit: 1 lb Weight Gained Since Last Visit: 0 lb Starting Weight: 273 lb Total Weight Loss (lbs): 29 lb (13.2 kg)   Body Composition  Body Fat %: 39.6 % Fat Mass (lbs): 96.8 lbs Muscle Mass (lbs): 140.2 lbs Total Body Water (lbs): 39.2 lbs Visceral Fat Rating : 9   Other Clinical Data Fasting: Yes Labs: Yes Today's Visit #: 68 Starting Date: 01/09/18    Chief Complaint:   OBESITY Rhonda [name] is here to discuss her progress with her obesity treatment plan. She is on the the Category 2 Plan and states she is following her eating plan approximately 70 % of the time. She states she is exercising HITT 60 minutes 2 times per week.   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  Will frequently wake tired in morning  Exercise-she has been attending HITT 2 x week and walking with her BF/Owen  Hydration-she estimates to drink >60 oz  Subjective:   1. Insulin resistance  Latest Reference Range & Units 03/07/21 08:43 08/23/21 08:53 04/02/22 11:04 10/04/22 09:04  INSULIN 2.6 - 24.9 uIU/mL 20.8 7.0 17.1 10.8  She has one more injection Wegovy 2.4mg  Denies mass in neck, dysphagia, dyspepsia, persistent hoarseness, abdominal pain, or N/V/C   2. Vitamin D deficiency  Latest Reference Range & Units 07/19/20 08:30 03/07/21 08:43 04/02/22 11:04 10/04/22 09:04  Vitamin D, 25-Hydroxy 30.0 - 100.0 ng/mL 44.3 70.0 49.5 38.5  She is on weekly Ergocalciferol- denies N/V/Muscle Weakness  3. Elevated serum  creatinine  Latest Reference Range & Units 03/07/21 08:43 08/23/21 08:47 04/02/22 11:04 10/04/22 09:04  Creatinine 0.57 - 1.00 mg/dL 1.61 (H) 0.96 (H) 0.45 0.94  (H): Data is abnormally high BP stable at OV She is not on ACE or ARb  4. Other fatigue She estimates to sleep 6-7 hrs a night. Will frequently wake tired in morning She denies hx of hypothyroidism  Assessment/Plan:   1. Insulin resistance Check Labs - Hemoglobin A1c - Insulin, random Refill wegovy- unlikely able to afford  2. Vitamin D deficiency Check Labs - VITAMIN D 25 Hydroxy (Vit-D Deficiency, Fractures)  3. Elevated serum creatinine Check Labs - Comprehensive metabolic panel  4. Other fatigue Check Labs - Vitamin B12 - TSH + free T4  5. Obesity, current BMI 35.1 Refill Semaglutide-Weight Management (WEGOVY) 2.4 MG/0.75ML SOAJ Inject 2.4 mg into the skin once a week. Dispense: 3 mL, Refills: 0 of 0 remaining   Jazzelyn is currently in the action stage of change. As such, her goal is to continue with weight loss efforts. She has agreed to the Category 2 Plan.   Exercise goals: For substantial health benefits, adults should do at least 150 minutes (2 hours and 30 minutes) a week of moderate-intensity, or 75 minutes (1 hour and 15 minutes) a week of vigorous-intensity aerobic physical activity, or an equivalent combination of moderate- and vigorous-intensity aerobic activity. Aerobic activity should be performed in episodes of at least 10 minutes, and preferably, it should be spread throughout the week.  Behavioral modification strategies: increasing lean protein intake, decreasing simple carbohydrates, increasing vegetables, increasing water intake, decreasing eating out, no skipping meals, meal planning and cooking strategies, and planning for success.  Eunita has agreed to follow-up with our clinic in 4 weeks. She was informed of the importance of frequent follow-up visits to maximize her success with  intensive lifestyle modifications for her multiple health conditions.   Rochele was informed we would discuss her lab results at her next visit unless there is a critical issue that needs to be addressed sooner. Shiela agreed to keep her next visit at the agreed upon time to discuss these results.  Objective:   Blood pressure 132/84, pulse 69, temperature 98.2 F (36.8 C), height 5\' 10"  (1.778 m), weight 244 lb (110.7 kg), SpO2 99 %. Body mass index is 35.01 kg/m.  General: Cooperative, alert, well developed, in no acute distress. HEENT: Conjunctivae and lids unremarkable. Cardiovascular: Regular rhythm.  Lungs: Normal work of breathing. Neurologic: No focal deficits.   Lab Results  Component Value Date   CREATININE 0.94 10/04/2022   BUN 9 10/04/2022   NA 139 10/04/2022   K 4.2 10/04/2022   CL 101 10/04/2022   CO2 22 10/04/2022   Lab Results  Component Value Date   ALT 27 10/04/2022   AST 28 10/04/2022   ALKPHOS 66 10/04/2022   BILITOT 0.6 10/04/2022   Lab Results  Component Value Date   HGBA1C 5.0 10/04/2022   HGBA1C 4.9 04/02/2022   HGBA1C 5.0 08/23/2021   HGBA1C 5.0 03/07/2021   HGBA1C 5.0 07/19/2020   Lab Results  Component Value Date   INSULIN 10.8 10/04/2022   INSULIN 17.1 04/02/2022   INSULIN 7.0 08/23/2021   INSULIN 20.8 03/07/2021   INSULIN 34.0 (H) 07/19/2020   Lab Results  Component Value Date   TSH 2.280 03/07/2021   Lab Results  Component Value Date   CHOL 143 04/02/2022   HDL 54 04/02/2022   LDLCALC 75 04/02/2022   TRIG 68 04/02/2022   CHOLHDL 2.6 04/02/2022   Lab Results  Component Value Date   VD25OH 38.5 10/04/2022   VD25OH 49.5 04/02/2022   VD25OH 70.0 03/07/2021   Lab Results  Component Value Date   WBC 4.7 01/09/2018   HGB 14.5 01/09/2018   HCT 42.1 01/09/2018   MCV 89 01/09/2018   PLT 195 09/20/2016   No results found for: "IRON", "TIBC", "FERRITIN"  Attestation Statements:   Reviewed by clinician on day of visit:  allergies, medications, problem list, medical history, surgical history, family history, social history, and previous encounter notes.  I have reviewed the above documentation for accuracy and completeness, and I agree with the above. -  Cashlyn Huguley d. Jayelle Page, NP-C

## 2023-01-25 ENCOUNTER — Telehealth: Payer: Self-pay

## 2023-01-25 DIAGNOSIS — F331 Major depressive disorder, recurrent, moderate: Secondary | ICD-10-CM | POA: Diagnosis not present

## 2023-01-25 DIAGNOSIS — F900 Attention-deficit hyperactivity disorder, predominantly inattentive type: Secondary | ICD-10-CM

## 2023-01-25 DIAGNOSIS — F4322 Adjustment disorder with anxiety: Secondary | ICD-10-CM | POA: Diagnosis not present

## 2023-01-25 LAB — TSH+FREE T4
Free T4: 1.21 ng/dL (ref 0.82–1.77)
TSH: 1.68 u[IU]/mL (ref 0.450–4.500)

## 2023-01-25 LAB — COMPREHENSIVE METABOLIC PANEL
ALT: 24 IU/L (ref 0–32)
AST: 26 IU/L (ref 0–40)
Albumin/Globulin Ratio: 1.8 (ref 1.2–2.2)
Albumin: 4.6 g/dL (ref 3.9–4.9)
Alkaline Phosphatase: 61 IU/L (ref 44–121)
BUN/Creatinine Ratio: 11 (ref 9–23)
BUN: 11 mg/dL (ref 6–20)
Bilirubin Total: 0.8 mg/dL (ref 0.0–1.2)
CO2: 22 mmol/L (ref 20–29)
Calcium: 9.5 mg/dL (ref 8.7–10.2)
Chloride: 103 mmol/L (ref 96–106)
Creatinine, Ser: 0.97 mg/dL (ref 0.57–1.00)
Globulin, Total: 2.5 g/dL (ref 1.5–4.5)
Glucose: 74 mg/dL (ref 70–99)
Potassium: 4.4 mmol/L (ref 3.5–5.2)
Sodium: 139 mmol/L (ref 134–144)
Total Protein: 7.1 g/dL (ref 6.0–8.5)
eGFR: 77 mL/min/{1.73_m2} (ref >59)

## 2023-01-25 LAB — URINE DRUGS OF ABUSE SCREEN W ALC, ROUTINE (REF LAB)
Amphetamines, Urine: NEGATIVE ng/mL
Barbiturate, Ur: NEGATIVE ng/mL
Benzodiazepine Quant, Ur: NEGATIVE ng/mL
Cannabinoid Quant, Ur: NEGATIVE ng/mL
Cocaine (Metab.): NEGATIVE ng/mL
Ethanol U, Quan: NEGATIVE %
Methadone Screen, Urine: NEGATIVE ng/mL
Opiate Quant, Ur: NEGATIVE ng/mL
Phencyclidine, Ur: NEGATIVE ng/mL
Propoxyphene, Urine: NEGATIVE ng/mL

## 2023-01-25 LAB — HEMOGLOBIN A1C
Est. average glucose Bld gHb Est-mCnc: 97 mg/dL
Hgb A1c MFr Bld: 5 % (ref 4.8–5.6)

## 2023-01-25 LAB — VITAMIN B12: Vitamin B-12: 595 pg/mL (ref 232–1245)

## 2023-01-25 LAB — VITAMIN D 25 HYDROXY (VIT D DEFICIENCY, FRACTURES): Vit D, 25-Hydroxy: 45.4 ng/mL (ref 30.0–100.0)

## 2023-01-25 LAB — INSULIN, RANDOM: INSULIN: 8.8 u[IU]/mL (ref 2.6–24.9)

## 2023-01-25 NOTE — Telephone Encounter (Signed)
pt called left message that she had her drug screening done and that she wanted to know when rx will be sent for the vyvanse.  Pt was last seen on  6-5 next appt 7-29

## 2023-01-25 NOTE — Telephone Encounter (Signed)
UDS result is still pending. Once I review , Vyvanse will be send .

## 2023-01-28 ENCOUNTER — Other Ambulatory Visit: Payer: Self-pay

## 2023-01-28 MED ORDER — LISDEXAMFETAMINE DIMESYLATE 20 MG PO CAPS
20.0000 mg | ORAL_CAPSULE | Freq: Every morning | ORAL | 0 refills | Status: DC
Start: 2023-01-28 — End: 2023-03-07
  Filled 2023-01-28: qty 30, 30d supply, fill #0

## 2023-01-28 NOTE — Telephone Encounter (Signed)
I have sent Vyvanse 20 mg to pharmacy at [facility].  Reviewed urine drug screen.  Okay to start medication.

## 2023-01-28 NOTE — Telephone Encounter (Signed)
pt called left message wanting to find out when the vyvanse will be sent to the pharmacy. pt states that she already had test done.

## 2023-01-29 ENCOUNTER — Other Ambulatory Visit: Payer: Self-pay

## 2023-01-30 ENCOUNTER — Other Ambulatory Visit: Payer: Self-pay

## 2023-02-01 ENCOUNTER — Other Ambulatory Visit (HOSPITAL_COMMUNITY): Payer: Self-pay

## 2023-02-04 DIAGNOSIS — H6121 Impacted cerumen, right ear: Secondary | ICD-10-CM | POA: Diagnosis not present

## 2023-02-04 DIAGNOSIS — H6981 Other specified disorders of Eustachian tube, right ear: Secondary | ICD-10-CM | POA: Diagnosis not present

## 2023-02-06 ENCOUNTER — Ambulatory Visit: Payer: 59 | Admitting: Psychiatry

## 2023-02-19 ENCOUNTER — Other Ambulatory Visit: Payer: Self-pay | Admitting: Psychiatry

## 2023-02-19 ENCOUNTER — Other Ambulatory Visit: Payer: Self-pay

## 2023-02-19 DIAGNOSIS — F5105 Insomnia due to other mental disorder: Secondary | ICD-10-CM

## 2023-02-19 MED ORDER — ZOLPIDEM TARTRATE 5 MG PO TABS
5.0000 mg | ORAL_TABLET | Freq: Every evening | ORAL | 1 refills | Status: DC | PRN
Start: 2023-02-19 — End: 2023-05-06
  Filled 2023-02-19: qty 30, 30d supply, fill #0
  Filled 2023-04-05: qty 30, 30d supply, fill #1

## 2023-02-20 ENCOUNTER — Encounter (INDEPENDENT_AMBULATORY_CARE_PROVIDER_SITE_OTHER): Payer: Self-pay | Admitting: Adult Health

## 2023-03-04 ENCOUNTER — Ambulatory Visit (INDEPENDENT_AMBULATORY_CARE_PROVIDER_SITE_OTHER): Payer: 59 | Admitting: Adult Health

## 2023-03-07 ENCOUNTER — Other Ambulatory Visit: Payer: Self-pay | Admitting: Oncology

## 2023-03-07 ENCOUNTER — Other Ambulatory Visit: Payer: Self-pay | Admitting: Psychiatry

## 2023-03-07 DIAGNOSIS — Z006 Encounter for examination for normal comparison and control in clinical research program: Secondary | ICD-10-CM

## 2023-03-07 DIAGNOSIS — F900 Attention-deficit hyperactivity disorder, predominantly inattentive type: Secondary | ICD-10-CM

## 2023-03-08 ENCOUNTER — Other Ambulatory Visit: Payer: Self-pay

## 2023-03-08 MED ORDER — LISDEXAMFETAMINE DIMESYLATE 20 MG PO CAPS
20.0000 mg | ORAL_CAPSULE | Freq: Every morning | ORAL | 0 refills | Status: DC
Start: 2023-03-08 — End: 2023-03-18
  Filled 2023-03-08: qty 30, 30d supply, fill #0

## 2023-03-13 ENCOUNTER — Other Ambulatory Visit: Payer: Self-pay

## 2023-03-13 ENCOUNTER — Ambulatory Visit (INDEPENDENT_AMBULATORY_CARE_PROVIDER_SITE_OTHER): Payer: 59 | Admitting: Adult Health

## 2023-03-13 ENCOUNTER — Encounter (INDEPENDENT_AMBULATORY_CARE_PROVIDER_SITE_OTHER): Payer: Self-pay | Admitting: Adult Health

## 2023-03-13 VITALS — BP 123/83 | HR 80 | Temp 98.3°F | Ht 70.0 in | Wt 253.0 lb

## 2023-03-13 DIAGNOSIS — Z6839 Body mass index (BMI) 39.0-39.9, adult: Secondary | ICD-10-CM

## 2023-03-13 DIAGNOSIS — E559 Vitamin D deficiency, unspecified: Secondary | ICD-10-CM | POA: Diagnosis not present

## 2023-03-13 DIAGNOSIS — E669 Obesity, unspecified: Secondary | ICD-10-CM | POA: Diagnosis not present

## 2023-03-13 DIAGNOSIS — R632 Polyphagia: Secondary | ICD-10-CM | POA: Diagnosis not present

## 2023-03-13 MED ORDER — TOPIRAMATE 25 MG PO TABS
ORAL_TABLET | ORAL | 0 refills | Status: DC
  Filled 2023-03-13: qty 60, 30d supply, fill #0

## 2023-03-13 MED ORDER — VITAMIN D (ERGOCALCIFEROL) 1.25 MG (50000 UNIT) PO CAPS
50000.0000 [IU] | ORAL_CAPSULE | ORAL | 0 refills | Status: DC
  Filled 2023-03-13: qty 4, 28d supply, fill #0

## 2023-03-13 NOTE — Progress Notes (Signed)
WEIGHT SUMMARY AND BIOMETRICS  Vitals Temp: 98.3 F (36.8 C) BP: 123/83 Pulse Rate: 80 SpO2: 99 %   Anthropometric Measurements Height: 5\' 10"  (1.778 m) Weight: 253 lb (114.8 kg) BMI (Calculated): 36.3 Weight at Last Visit: 244lb Weight Lost Since Last Visit: 0 Weight Gained Since Last Visit: 9lb Starting Weight: 273lb Total Weight Loss (lbs): 20 lb (9.072 kg)   Body Composition  Body Fat %: 40.8 % Fat Mass (lbs): 103.6 lbs Muscle Mass (lbs): 142.6 lbs Total Body Water (lbs): 99.4 lbs Visceral Fat Rating : 9   Other Clinical Data Fasting: no Labs: no Today's Visit #: 24 Starting Date: 01/09/18    Chief Complaint:   OBESITY Rhonda [name] is here to discuss her progress with her obesity treatment plan. She is on the the Category 2 Plan and states she is following her eating plan approximately 50 % of the time. She states she is not currently exercising.   Interim History:  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  She endorses the following challenges to weight loss efforts: - Planning meals for week -Defrosting protein ahead of time -Packing lunch -Workplace temptations -Cravings -Not prioritizing healthy eating/exercise -Anger that you have to work so hard to lose weight when others do not  Subjective:   1. Polyphagia She endorses persistent polyphagia She denies hx of seizure d/o She experienced nephrolithiasis >10 yeats ago Birth control- IUD  2. Vitamin D deficiency  Latest Reference Range & Units 04/02/22 11:04 10/04/22 09:04 01/24/23 09:39  Vitamin D, 25-Hydroxy 30.0 - 100.0 ng/mL 49.5 38.5 45.4   She is on weekly Ergocalciferol- denies N/V/Muscle Weakness  Assessment/Plan:   1. Polyphagia Start topiramate (TOPAMAX) 25 MG tablet Take 1 tablet by mouth nightly at  bedtime for two weeks, then increase to 2 tabs nightly at bedtime- hold at this dose Dispense: 60 tablet, Refills: 0 of 0 remaining   2. Vitamin D deficiency Refill Vitamin D, Ergocalciferol, (DRISDOL) 1.25 MG (50000 UNIT) CAPS capsule Take 1 capsule (50,000 Units total) by mouth every 7 (seven) days. Dispense: 4 capsule, Refills: 0 of 0 remaining   3. Obesity, current BMI 36.3  Zamyia is not currently in the action stage of change. As such, her goal is to get back to weightloss efforts . She has agreed to the Category 2 Plan.   Exercise goals: For substantial health benefits, adults should do at least 150 minutes (2 hours and 30 minutes) a week of moderate-intensity, or 75 minutes (1 hour and 15 minutes) a week of vigorous-intensity aerobic physical activity, or an equivalent combination of moderate- and vigorous-intensity aerobic activity. Aerobic activity should be performed in episodes of at least 10 minutes, and preferably, it should be spread throughout the week.  Behavioral modification strategies: increasing lean protein intake, decreasing simple carbohydrates, increasing vegetables, increasing water intake, decreasing eating out, no skipping meals, meal planning and cooking strategies, keeping healthy foods in the home, better snacking choices, and planning for success.  Jacquelin has agreed to follow-up with our clinic in 4 weeks. She was informed of the importance of frequent follow-up visits to maximize her success with intensive lifestyle modifications for her multiple health conditions.   Objective:   Blood pressure 123/83, pulse 80, temperature 98.3 F (36.8 C), height 5\' 10"  (1.778 m), weight 253 lb (114.8 kg),  SpO2 99%. Body mass index is 36.3 kg/m.  General: Cooperative, alert, well developed, in no acute distress. HEENT: Conjunctivae and lids unremarkable. Cardiovascular: Regular rhythm.  Lungs: Normal work of breathing. Neurologic: No focal deficits.   Lab Results   Component Value Date   CREATININE 0.97 01/24/2023   BUN 11 01/24/2023   NA 139 01/24/2023   K 4.4 01/24/2023   CL 103 01/24/2023   CO2 22 01/24/2023   Lab Results  Component Value Date   ALT 24 01/24/2023   AST 26 01/24/2023   ALKPHOS 61 01/24/2023   BILITOT 0.8 01/24/2023   Lab Results  Component Value Date   HGBA1C 5.0 01/24/2023   HGBA1C 5.0 10/04/2022   HGBA1C 4.9 04/02/2022   HGBA1C 5.0 08/23/2021   HGBA1C 5.0 03/07/2021   Lab Results  Component Value Date   INSULIN 8.8 01/24/2023   INSULIN 10.8 10/04/2022   INSULIN 17.1 04/02/2022   INSULIN 7.0 08/23/2021   INSULIN 20.8 03/07/2021   Lab Results  Component Value Date   TSH 1.680 01/24/2023   Lab Results  Component Value Date   CHOL 143 04/02/2022   HDL 54 04/02/2022   LDLCALC 75 04/02/2022   TRIG 68 04/02/2022   CHOLHDL 2.6 04/02/2022   Lab Results  Component Value Date   VD25OH 45.4 01/24/2023   VD25OH 38.5 10/04/2022   VD25OH 49.5 04/02/2022   Lab Results  Component Value Date   WBC 4.7 01/09/2018   HGB 14.5 01/09/2018   HCT 42.1 01/09/2018   MCV 89 01/09/2018   PLT 195 09/20/2016   No results found for: "IRON", "TIBC", "FERRITIN"  Attestation Statements:   Reviewed by clinician on day of visit: allergies, medications, problem list, medical history, surgical history, family history, social history, and previous encounter notes.  I have reviewed the above documentation for accuracy and completeness, and I agree with the above. -  Lalla Laham d. Tamelia Michalowski, NP-C

## 2023-03-15 DIAGNOSIS — F4322 Adjustment disorder with anxiety: Secondary | ICD-10-CM | POA: Diagnosis not present

## 2023-03-15 DIAGNOSIS — F331 Major depressive disorder, recurrent, moderate: Secondary | ICD-10-CM | POA: Diagnosis not present

## 2023-03-18 ENCOUNTER — Encounter: Payer: Self-pay | Admitting: Psychiatry

## 2023-03-18 ENCOUNTER — Other Ambulatory Visit: Payer: Self-pay

## 2023-03-18 ENCOUNTER — Ambulatory Visit (INDEPENDENT_AMBULATORY_CARE_PROVIDER_SITE_OTHER): Payer: 59 | Admitting: Psychiatry

## 2023-03-18 VITALS — BP 124/84 | HR 73 | Temp 97.6°F | Ht 70.0 in | Wt 257.8 lb

## 2023-03-18 DIAGNOSIS — F5105 Insomnia due to other mental disorder: Secondary | ICD-10-CM | POA: Diagnosis not present

## 2023-03-18 DIAGNOSIS — F900 Attention-deficit hyperactivity disorder, predominantly inattentive type: Secondary | ICD-10-CM | POA: Diagnosis not present

## 2023-03-18 DIAGNOSIS — F3342 Major depressive disorder, recurrent, in full remission: Secondary | ICD-10-CM

## 2023-03-18 DIAGNOSIS — F411 Generalized anxiety disorder: Secondary | ICD-10-CM | POA: Diagnosis not present

## 2023-03-18 MED ORDER — BUPROPION HCL ER (SR) 100 MG PO TB12
100.0000 mg | ORAL_TABLET | Freq: Every morning | ORAL | 0 refills | Status: DC
Start: 2023-03-18 — End: 2023-06-18
  Filled 2023-03-18 – 2023-04-05 (×2): qty 90, 90d supply, fill #0

## 2023-03-18 MED ORDER — LISDEXAMFETAMINE DIMESYLATE 20 MG PO CAPS
20.0000 mg | ORAL_CAPSULE | Freq: Every morning | ORAL | 0 refills | Status: DC
Start: 2023-04-10 — End: 2023-05-06
  Filled 2023-04-28: qty 30, 30d supply, fill #0

## 2023-03-18 NOTE — Progress Notes (Unsigned)
BH MD OP Progress Note  03/18/2023 9:12 AM Rhonda [name]  MRN:  [id number]  Chief Complaint:  Chief Complaint  Patient presents with   Follow-up   Anxiety   Depression   Medication Refill   HPI: Rhonda [name] is a 37 year old Caucasian female, divorced, employed, lives in [place], has a history of depression, anxiety , ADHD, insomnia was evaluated in office today.  Patient today reports she is currently improving with regards to her mood.  She had a court hearing recently regarding child custody.  She reports they are currently sharing custody, a week on and a week off.  She reports she had a good conversation with her ex-husband recently and that also helped.  She is currently trying to get adjusted to the new schedule with her shoulder.  Patient reports she is currently taking the Vyvanse which helps with her attention and focus.  She does have heart palpitations, however it is getting better and does not happen frequently.  She is currently cutting back on caffeine intake.  She would like to stay on this dosage of Vyvanse and agrees to let writer know or get immediate help if she has any side effects.  She does struggle with sleep.  She does not want to take the Ambien every night which does help when she takes it.  When she does not take the Ambien her sleep is broken.  Patient reports anxiety and depression symptoms as manageable.  Currently compliant on the Wellbutrin, BuSpar.  Patient denies any suicidality, homicidality or perceptual disturbances.  Patient continues to follow-up with her therapist Ms.Tommy Rainwater.  Currently trying to lose weight and was recently started on topiramate.  Visit Diagnosis:    ICD-10-CM   1. MDD (major depressive disorder), recurrent, in full remission (HCC)  F33.42 buPROPion ER (WELLBUTRIN SR) 100 MG 12 hr tablet    2. GAD (generalized anxiety disorder)  F41.1     3. Insomnia due to mental condition  F51.05    Anxiety    4.  Attention deficit hyperactivity disorder (ADHD), predominantly inattentive type  F90.0 lisdexamfetamine (VYVANSE) 20 MG capsule      Past Psychiatric History: I have reviewed past psychiatric history from progress note on 11/05/2018.  Past Medical History:  Past Medical History:  Diagnosis Date   Depression    treated   PCOS (polycystic ovarian syndrome) 2008   Plantar fasciitis     Past Surgical History:  Procedure Laterality Date   CHOLECYSTECTOMY      Family Psychiatric History: I have reviewed family psychiatric history from progress note on 11/05/2018.  Family History:  Family History  Problem Relation Age of Onset   High blood pressure Mother    Depression Father    Liver disease Father    Alcoholism Father    Alcohol abuse Father    Diabetes Paternal Grandmother    Bipolar disorder Maternal Aunt     Social History: I have reviewed social history from progress note on 11/05/2018. Social History   Socioeconomic History   Marital status: Legally Separated    Spouse name: Christiane Ha   Number of children: 2   Years of education: Not on file   Highest education level: Bachelor's degree (e.g., BA, AB, BS)  Occupational History   Occupation: RN  Tobacco Use   Smoking status: Never   Smokeless tobacco: Never  Vaping Use   Vaping status: Never Used  Substance and Sexual Activity   Alcohol use: Yes  Alcohol/week: 1.0 standard drink of alcohol    Types: 1 Glasses of wine per week    Comment: Occasional once a week.   Drug use: No   Sexual activity: Yes    Partners: Male    Birth control/protection: None, I.U.D.  Other Topics Concern   Not on file  Social History Narrative   Not on file   Social Determinants of Health   Financial Resource Strain: Low Risk  (06/11/2022)   Received from [facility], [facility]   Overall Financial Resource Strain (CARDIA)    Difficulty of Paying Living Expenses: Not hard at all  Food  Insecurity: No Food Insecurity (06/11/2022)   Received from [facility], [facility]   Hunger Vital Sign    Worried About Running Out of Food in the Last Year: Never true    Ran Out of Food in the Last Year: Never true  Transportation Needs: No Transportation Needs (06/11/2022)   Received from [facility], [facility]   [facility] - Transportation    In the past 12 months, has lack of transportation kept you from medical appointments or from getting medications?: No    Lack of Transportation (Non-Medical): No  Physical Activity: Inactive (11/05/2018)   Exercise Vital Sign    Days of Exercise per Week: 0 days    Minutes of Exercise per Session: 0 min  Stress: Stress Concern Present (11/05/2018)   [employer] [facility] Occupational [facility] - Occupational Stress Questionnaire    Feeling of Stress : Rather much  Social Connections: Unknown (11/05/2018)   Social Connection and Isolation Panel [NHANES]    Frequency of Communication with Friends and Family: Not on file    Frequency of Social Gatherings with Friends and Family: Not on file    Attends Religious Services: More than 4 times per year    Active Member of Clubs or Organizations: No    Attends Banker Meetings: Never    Marital Status: Married    Allergies: No Known Allergies  Metabolic Disorder Labs: Lab Results  Component Value Date   HGBA1C 5.0 01/24/2023   No results found for: "PROLACTIN" Lab Results  Component Value Date   CHOL 143 04/02/2022   TRIG 68 04/02/2022   HDL 54 04/02/2022   CHOLHDL 2.6 04/02/2022   LDLCALC 75 04/02/2022   LDLCALC 76 03/07/2021   Lab Results  Component Value Date   TSH 1.680 01/24/2023   TSH 2.280 03/07/2021    Therapeutic Level Labs: No results found for: "LITHIUM" No results found for: "VALPROATE" No results found for: "CBMZ"  Current Medications: Current Outpatient Medications  Medication Sig  Dispense Refill   buPROPion ER (WELLBUTRIN SR) 100 MG 12 hr tablet Take 1 tablet (100 mg total) by mouth in the morning. 90 tablet 0   busPIRone (BUSPAR) 15 MG tablet Take 1 tablet (15 mg total) by mouth 2 (two) times daily. 180 tablet 1   ELDERBERRY PO Take by mouth.     hydrOXYzine (VISTARIL) 25 MG capsule Take 1-2 capsules (25-50 mg total) by mouth at bedtime as needed for sleep 180 capsule 0   levocetirizine (XYZAL) 5 MG tablet Take 5 mg by mouth every evening.     levonorgestrel (MIRENA) 20 MCG/24HR IUD 1 each by Intrauterine route once.     Multiple Vitamin (MULTIVITAMIN) tablet Take 1 tablet by mouth daily.     Probiotic Product (PROBIOTIC-10 PO) Take by mouth.  propranolol (INDERAL) 20 MG tablet Take 1 tablet (20 mg total) by mouth 2 (two) times daily as needed 60 tablet 1   spironolactone (ALDACTONE) 100 MG tablet Take 1 tablet (100 mg total) by mouth once daily 90 tablet 3   topiramate (TOPAMAX) 25 MG tablet Take 1 tablet by mouth nightly at bedtime for two weeks, then increase to 2 tabs nightly at bedtime- hold at this dose 60 tablet 0   triamcinolone cream (KENALOG) 0.1 % Apply twice daily to hands as needed. 45 g 1   Vitamin D, Ergocalciferol, (DRISDOL) 1.25 MG (50000 UNIT) CAPS capsule Take 1 capsule (50,000 Units total) by mouth every 7 (seven) days. 4 capsule 0   zolpidem (AMBIEN) 5 MG tablet Take 1 tablet (5 mg total) by mouth at bedtime as needed for sleep. 30 tablet 1   [START ON 04/10/2023] lisdexamfetamine (VYVANSE) 20 MG capsule Take 1 capsule (20 mg total) by mouth in the morning. 30 capsule 0   No current facility-administered medications for this visit.     Musculoskeletal: Strength & Muscle Tone: within normal limits Gait & Station: normal Patient leans: N/A  Psychiatric Specialty Exam: Review of Systems  Psychiatric/Behavioral:  The patient is nervous/anxious.     Blood pressure 124/84, pulse 73, temperature 97.6 F (36.4 C), temperature source Skin, height  5\' 10"  (1.778 m), weight 257 lb 12.8 oz (116.9 kg).Body mass index is 36.99 kg/m.  General Appearance: Casual  Eye Contact:  Fair  Speech:  Clear and Coherent  Volume:  Normal  Mood:  Anxious  Affect:  Congruent  Thought Process:  Goal Directed and Descriptions of Associations: Intact  Orientation:  Full (Time, Place, and Person)  Thought Content: Logical   Suicidal Thoughts:  No  Homicidal Thoughts:  No  Memory:  Immediate;   Fair Recent;   Fair Remote;   Fair  Judgement:  Fair  Insight:  Fair  Psychomotor Activity:  Normal  Concentration:  Concentration: Fair and Attention Span: Fair  Recall:  Rhonda [name]: Fair  Language: Fair  Akathisia:  No  Handed:  Right  AIMS (if indicated): not done  Assets:  Communication Skills Desire for Improvement Housing Physical Health Social Support  ADL's:  Intact  Cognition: WNL  Sleep:  Fair   Screenings: GAD-7    Garment/textile technologist Visit from 03/18/2023 in [facility] Office Visit from 01/23/2023 in [facility] Video Visit from 12/07/2020 in [facility]  Total GAD-7 Score 4 12 1       PHQ2-9    Flowsheet Row Office Visit from 03/18/2023 in [facility] Office Visit from 01/23/2023 in [facility] Video Visit from 08/06/2022 in [facility] Office Visit from 01/04/2022 in [facility] Office Visit from 09/14/2021 in [facility]  PHQ-2 Total Score 1 3 0 2 0  PHQ-9 Total Score 8 14 -- 10 --      Flowsheet Row Office Visit from 03/18/2023 in [facility] Office Visit from 01/23/2023 in [facility] Video Visit from 08/06/2022 in [facility]  C-SSRS RISK CATEGORY No Risk No Risk No Risk        Assessment and Plan: YUJIN ANTES is a 37 year old Caucasian female, divorced, employed, has a history of  depression, anxiety, ADHD was evaluated in office today.  Patient with sleep problems as well as rare palpitations unknown if anxiety induced versus secondary to medications, will benefit from the following plan.  Plan MDD in remission Will reduce Wellbutrin to Wellbutrin SR 100 mg p.o. daily in the morning Continue BuSpar 15 mg p.o. twice daily. Will consider adding an SSRI in the future as needed. Continue L-methylfolate as prescribed.  GAD-improving Continue BuSpar 15 mg p.o. twice daily Hydroxyzine 25 mg p.o. daily as needed for severe anxiety attacks Propranolol 20 mg p.o. twice daily as needed Continue CBT with Ms. Tommy Rainwater.  Insomnia-unstable Continue Ambien 5 mg p.o. nightly as needed Discussed adding over-the-counter medications like sleep #3, sleep #5,Relaxium. Patient also work on sleep hygiene techniques Provided information for Insight Timer, Headspace, patient to work on relaxation techniques prior to bedtime.  ADHD-improving Vyvanse 20 g p.o. daily Reviewed [place] PMP AWARxE Patient advised to monitor her palpitations and to get immediate help.  Will consider stopping stimulants if she has continued palpitations and may need cardiology clearance.  Follow-up in clinic in 8 to 9 weeks or sooner if needed.    Collaboration of Care: Collaboration of Care: Referral or follow-up with counselor/therapist AEB patient is to continue follow-up with therapist.  Patient/Guardian was advised Release of Information must be obtained prior to any record release in order to collaborate their care with an outside provider. Patient/Guardian was advised if they have not already done so to contact the registration department to sign all necessary forms in order for [name] to release  information regarding their care.   Consent: Patient/Guardian gives verbal consent for treatment and assignment of benefits for services provided during this visit. Patient/Guardian expressed understanding and agreed to proceed.   This note was generated in part or whole with voice recognition software. Voice recognition is usually quite accurate but there are transcription errors that can and very often do occur. I apologize for any typographical errors that were not detected and corrected.    Jomarie Longs, MD 03/18/2023, 9:12 AM

## 2023-04-04 ENCOUNTER — Other Ambulatory Visit: Payer: Self-pay

## 2023-04-05 ENCOUNTER — Other Ambulatory Visit: Payer: Self-pay

## 2023-04-05 DIAGNOSIS — F331 Major depressive disorder, recurrent, moderate: Secondary | ICD-10-CM | POA: Diagnosis not present

## 2023-04-05 DIAGNOSIS — F4322 Adjustment disorder with anxiety: Secondary | ICD-10-CM | POA: Diagnosis not present

## 2023-04-12 DIAGNOSIS — F331 Major depressive disorder, recurrent, moderate: Secondary | ICD-10-CM | POA: Diagnosis not present

## 2023-04-12 DIAGNOSIS — F4322 Adjustment disorder with anxiety: Secondary | ICD-10-CM | POA: Diagnosis not present

## 2023-04-15 ENCOUNTER — Ambulatory Visit (INDEPENDENT_AMBULATORY_CARE_PROVIDER_SITE_OTHER): Payer: 59 | Admitting: Adult Health

## 2023-04-15 ENCOUNTER — Other Ambulatory Visit: Payer: Self-pay

## 2023-04-15 ENCOUNTER — Encounter (INDEPENDENT_AMBULATORY_CARE_PROVIDER_SITE_OTHER): Payer: Self-pay | Admitting: Adult Health

## 2023-04-15 VITALS — BP 130/85 | HR 99 | Temp 97.7°F | Ht 70.0 in | Wt 252.0 lb

## 2023-04-15 DIAGNOSIS — R632 Polyphagia: Secondary | ICD-10-CM

## 2023-04-15 DIAGNOSIS — E559 Vitamin D deficiency, unspecified: Secondary | ICD-10-CM | POA: Diagnosis not present

## 2023-04-15 DIAGNOSIS — Z6836 Body mass index (BMI) 36.0-36.9, adult: Secondary | ICD-10-CM

## 2023-04-15 DIAGNOSIS — E669 Obesity, unspecified: Secondary | ICD-10-CM

## 2023-04-15 DIAGNOSIS — E88819 Insulin resistance, unspecified: Secondary | ICD-10-CM

## 2023-04-15 MED ORDER — VITAMIN D (ERGOCALCIFEROL) 1.25 MG (50000 UNIT) PO CAPS
50000.0000 [IU] | ORAL_CAPSULE | ORAL | 0 refills | Status: DC
  Filled 2023-04-15: qty 4, 28d supply, fill #0

## 2023-04-15 MED ORDER — TOPIRAMATE 50 MG PO TABS
50.0000 mg | ORAL_TABLET | Freq: Every day | ORAL | 0 refills | Status: DC
  Filled 2023-04-15: qty 30, 30d supply, fill #0
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
## 2023-04-16 ENCOUNTER — Other Ambulatory Visit: Payer: Self-pay

## 2023-04-16 ENCOUNTER — Other Ambulatory Visit: Payer: Self-pay | Admitting: Psychiatry

## 2023-04-16 DIAGNOSIS — F3342 Major depressive disorder, recurrent, in full remission: Secondary | ICD-10-CM

## 2023-04-16 DIAGNOSIS — F411 Generalized anxiety disorder: Secondary | ICD-10-CM

## 2023-04-17 ENCOUNTER — Other Ambulatory Visit (HOSPITAL_COMMUNITY): Payer: Self-pay

## 2023-04-17 ENCOUNTER — Other Ambulatory Visit: Payer: Self-pay

## 2023-04-17 ENCOUNTER — Other Ambulatory Visit: Payer: Self-pay | Admitting: Psychiatry

## 2023-04-17 DIAGNOSIS — F3342 Major depressive disorder, recurrent, in full remission: Secondary | ICD-10-CM

## 2023-04-17 DIAGNOSIS — F411 Generalized anxiety disorder: Secondary | ICD-10-CM

## 2023-04-17 MED FILL — Buspirone HCl Tab 15 MG: ORAL | 90 days supply | Qty: 180 | Fill #0 | Status: AC

## 2023-04-18 ENCOUNTER — Other Ambulatory Visit: Payer: Self-pay

## 2023-04-19 ENCOUNTER — Other Ambulatory Visit: Payer: Self-pay

## 2023-04-19 DIAGNOSIS — F331 Major depressive disorder, recurrent, moderate: Secondary | ICD-10-CM | POA: Diagnosis not present

## 2023-04-19 DIAGNOSIS — F4322 Adjustment disorder with anxiety: Secondary | ICD-10-CM | POA: Diagnosis not present

## 2023-04-28 ENCOUNTER — Other Ambulatory Visit: Payer: Self-pay

## 2023-04-29 ENCOUNTER — Other Ambulatory Visit: Payer: Self-pay

## 2023-04-29 DIAGNOSIS — F4322 Adjustment disorder with anxiety: Secondary | ICD-10-CM | POA: Diagnosis not present

## 2023-04-29 DIAGNOSIS — F331 Major depressive disorder, recurrent, moderate: Secondary | ICD-10-CM | POA: Diagnosis not present

## 2023-05-01 ENCOUNTER — Telehealth: Payer: 59 | Admitting: Emergency Medicine

## 2023-05-01 DIAGNOSIS — U071 COVID-19: Secondary | ICD-10-CM | POA: Diagnosis not present

## 2023-05-01 MED ORDER — BENZONATATE 100 MG PO CAPS: 100.0000 mg | ORAL_CAPSULE | Freq: Two times a day (BID) | ORAL | 0 refills | Status: DC | PRN

## 2023-05-01 NOTE — Patient Instructions (Signed)
Angelia Mould, thank you for joining Roxy Horseman, PA-C for today's virtual visit.  While this provider is not your primary care provider (PCP), if your PCP is located in our provider database this encounter information will be shared with them immediately following your visit.   A [facility] MyChart account gives you access to today's visit and all your visits, tests, and labs performed at [facility] " click here if you don't have a [facility] MyChart account or go to [email or web address]  Consent: (Patient) ELLIONA DUFFER provided verbal consent for this virtual visit at the beginning of the encounter.  Current Medications:  Current Outpatient Medications:    benzonatate (TESSALON) 100 MG capsule, Take 1 capsule (100 mg total) by mouth 2 (two) times daily as needed for cough., Disp: 20 capsule, Rfl: 0   buPROPion ER (WELLBUTRIN SR) 100 MG 12 hr tablet, Take 1 tablet (100 mg total) by mouth in the morning., Disp: 90 tablet, Rfl: 0   busPIRone (BUSPAR) 15 MG tablet, Take 1 tablet (15 mg total) by mouth 2 (two) times daily., Disp: 180 tablet, Rfl: 1   ELDERBERRY PO, Take by mouth., Disp: , Rfl:    hydrOXYzine (VISTARIL) 25 MG capsule, Take 1-2 capsules (25-50 mg total) by mouth at bedtime as needed for sleep, Disp: 180 capsule, Rfl: 0   levocetirizine (XYZAL) 5 MG tablet, Take 5 mg by mouth every evening., Disp: , Rfl:    levonorgestrel (MIRENA) 20 MCG/24HR IUD, 1 each by Intrauterine route once., Disp: , Rfl:    lisdexamfetamine (VYVANSE) 20 MG capsule, Take 1 capsule (20 mg total) by mouth in the morning., Disp: 30 capsule, Rfl: 0   Multiple Vitamin (MULTIVITAMIN) tablet, Take 1 tablet by mouth daily., Disp: , Rfl:    Probiotic Product (PROBIOTIC-10 PO), Take by mouth., Disp: , Rfl:    propranolol (INDERAL) 20 MG tablet, Take 1 tablet (20 mg total) by mouth 2 (two) times daily as needed, Disp: 60 tablet, Rfl: 1   spironolactone (ALDACTONE) 100 MG tablet, Take 1  tablet (100 mg total) by mouth once daily, Disp: 90 tablet, Rfl: 3   topiramate (TOPAMAX) 50 MG tablet, Take 1 tablet (50 mg total) by mouth daily., Disp: 30 tablet, Rfl: 0   triamcinolone cream (KENALOG) 0.1 %, Apply twice daily to hands as needed., Disp: 45 g, Rfl: 1   Vitamin D, Ergocalciferol, (DRISDOL) 1.25 MG (50000 UNIT) CAPS capsule, Take 1 capsule (50,000 Units total) by mouth every 7 (seven) days., Disp: 4 capsule, Rfl: 0   zolpidem (AMBIEN) 5 MG tablet, Take 1 tablet (5 mg total) by mouth at bedtime as needed for sleep., Disp: 30 tablet, Rfl: 1   Medications ordered in this encounter:  Meds ordered this encounter  Medications   benzonatate (TESSALON) 100 MG capsule    Sig: Take 1 capsule (100 mg total) by mouth 2 (two) times daily as needed for cough.    Dispense:  20 capsule    Refill:  0    Order Specific Question:   Supervising Provider    Answer:   Merrilee Jansky [id number]     *If you need refills on other medications prior to your next appointment, please contact your pharmacy*  Follow-Up: Call back or seek an in-person evaluation if the symptoms worsen or if the condition fails to improve as anticipated.  [facility] [phone]  Other Instructions    If you have been instructed to have an in-person evaluation today  at a local Urgent Care facility, please use the link below. It will take you to a list of all of our available [facility], including address, phone number and hours of operation. Please do not delay care.  [facility]  If you or a family member do not have a primary care provider, use the link below to schedule a visit and establish care. When you choose a [facility] primary care physician or advanced practice provider, you gain a long-term partner in health. Find a Primary Care Provider  Learn more about [facility]'s in-office and virtual care options:  - Get Care Now

## 2023-05-01 NOTE — Progress Notes (Signed)
Virtual Visit Consent   FARRYN BROTHER, you are scheduled for a virtual visit with a [facility] provider today. Just as with appointments in the office, your consent must be obtained to participate. Your consent will be active for this visit and any virtual visit you may have with one of our providers in the next 365 days. If you have a MyChart account, a copy of this consent can be sent to you electronically.  As this is a virtual visit, video technology does not allow for your provider to perform a traditional examination. This may limit your provider's ability to fully assess your condition. If your provider identifies any concerns that need to be evaluated in person or the need to arrange testing (such as labs, EKG, etc.), we will make arrangements to do so. Although advances in technology are sophisticated, we cannot ensure that it will always work on either your end or our end. If the connection with a video visit is poor, the visit may have to be switched to a telephone visit. With either a video or telephone visit, we are not always able to ensure that we have a secure connection.  By engaging in this virtual visit, you consent to the provision of healthcare and authorize for your insurance to be billed (if applicable) for the services provided during this visit. Depending on your insurance coverage, you may receive a charge related to this service.  I need to obtain your verbal consent now. Are you willing to proceed with your visit today? SYEEDA GODARD has provided verbal consent on 05/01/2023 for a virtual visit (video or telephone). Roxy Horseman, PA-C  Date: 05/01/2023 3:30 PM  Virtual Visit via Video Note   I, Roxy Horseman, connected with  KITZIE KEEBLER  ([id number], 1985/09/01) on 05/01/23 at  3:30 PM EDT by a video-enabled telemedicine application and verified that I am speaking with the correct person using two identifiers.  Location: Patient: Virtual Visit Location  Patient: Home Provider: Virtual Visit Location Provider: Home   I discussed the limitations of evaluation and management by telemedicine and the availability of in person appointments. The patient expressed understanding and agreed to proceed.    History of Present Illness: Rhonda [name] is a 37 y.o. who identifies as a female who was assigned female at birth, and is being seen today for COVID 29.  States that she started having sinus congestion and runny nose last night.  Reports body aches.  Denies cough.  Denies fever.  Has been taking allergy meds and ibuprofen and Tylenol.  HPI: HPI  Problems:  Patient Active Problem List   Diagnosis Date Noted   Attention deficit hyperactivity disorder (ADHD), predominantly inattentive type 01/23/2023   High risk medication use 01/23/2023   Elevated serum creatinine 04/12/2022   Health care maintenance 03/08/2022   Attention and concentration deficit 01/04/2022   Transaminitis 08/29/2021   Diarrhea 07/04/2021   Elevated liver enzymes 08/02/2020   MDD (major depressive disorder), recurrent, in full remission (HCC) 12/31/2019   Numbness and tingling in left hand 10/15/2019   MDD (major depressive disorder), recurrent, in partial remission (HCC) 10/01/2019   Cubital tunnel syndrome, left 09/07/2019   Rotator cuff tendinitis, left 09/07/2019   Insomnia due to mental condition 06/23/2019   MDD (major depressive disorder), recurrent episode, mild (HCC) 05/04/2019   GAD (generalized anxiety disorder) 05/04/2019   Vitamin D deficiency 10/28/2018   Insulin resistance 10/28/2018   Class 2 severe obesity with serious comorbidity and  body mass index (BMI) of 39.0 to 39.9 in adult [facility]) 10/28/2018   Gestational hypertension 09/18/2016   Pregnancy 05/07/2016   First trimester screening 03/19/2016   Family history of congenital heart defect    Spontaneous abortion in second trimester 10/24/2012   Depression 12/05/2011   History of kidney stones  12/05/2011   PCOS (polycystic ovarian syndrome) 12/05/2011    Allergies: No Known Allergies Medications:  Current Outpatient Medications:    buPROPion ER (WELLBUTRIN SR) 100 MG 12 hr tablet, Take 1 tablet (100 mg total) by mouth in the morning., Disp: 90 tablet, Rfl: 0   busPIRone (BUSPAR) 15 MG tablet, Take 1 tablet (15 mg total) by mouth 2 (two) times daily., Disp: 180 tablet, Rfl: 1   ELDERBERRY PO, Take by mouth., Disp: , Rfl:    hydrOXYzine (VISTARIL) 25 MG capsule, Take 1-2 capsules (25-50 mg total) by mouth at bedtime as needed for sleep, Disp: 180 capsule, Rfl: 0   levocetirizine (XYZAL) 5 MG tablet, Take 5 mg by mouth every evening., Disp: , Rfl:    levonorgestrel (MIRENA) 20 MCG/24HR IUD, 1 each by Intrauterine route once., Disp: , Rfl:    lisdexamfetamine (VYVANSE) 20 MG capsule, Take 1 capsule (20 mg total) by mouth in the morning., Disp: 30 capsule, Rfl: 0   Multiple Vitamin (MULTIVITAMIN) tablet, Take 1 tablet by mouth daily., Disp: , Rfl:    Probiotic Product (PROBIOTIC-10 PO), Take by mouth., Disp: , Rfl:    propranolol (INDERAL) 20 MG tablet, Take 1 tablet (20 mg total) by mouth 2 (two) times daily as needed, Disp: 60 tablet, Rfl: 1   spironolactone (ALDACTONE) 100 MG tablet, Take 1 tablet (100 mg total) by mouth once daily, Disp: 90 tablet, Rfl: 3   topiramate (TOPAMAX) 50 MG tablet, Take 1 tablet (50 mg total) by mouth daily., Disp: 30 tablet, Rfl: 0   triamcinolone cream (KENALOG) 0.1 %, Apply twice daily to hands as needed., Disp: 45 g, Rfl: 1   Vitamin D, Ergocalciferol, (DRISDOL) 1.25 MG (50000 UNIT) CAPS capsule, Take 1 capsule (50,000 Units total) by mouth every 7 (seven) days., Disp: 4 capsule, Rfl: 0   zolpidem (AMBIEN) 5 MG tablet, Take 1 tablet (5 mg total) by mouth at bedtime as needed for sleep., Disp: 30 tablet, Rfl: 1  Observations/Objective: Patient is well-developed, well-nourished in no acute distress.  Resting comfortably at home.  Head is normocephalic,  atraumatic.  No labored breathing.  Speech is clear and coherent with logical content.  Patient is alert and oriented at baseline.    Assessment and Plan: 1. COVID-19  Discussed treatment options.  Elected to proceed with supportive care with fluids, rest, and tessalon perles.   Meds ordered this encounter  Medications   benzonatate (TESSALON) 100 MG capsule    Sig: Take 1 capsule (100 mg total) by mouth 2 (two) times daily as needed for cough.    Dispense:  20 capsule    Refill:  0    Order Specific Question:   Supervising Provider    Answer:   Merrilee Jansky [id number]     Follow Up Instructions: I discussed the assessment and treatment plan with the patient. The patient was provided an opportunity to ask questions and all were answered. The patient agreed with the plan and demonstrated an understanding of the instructions.  A copy of instructions were sent to the patient via MyChart unless otherwise noted below.     The patient was advised to call back or  seek an in-person evaluation if the symptoms worsen or if the condition fails to improve as anticipated.  Time:  I spent 10 minutes with the patient via telehealth technology discussing the above problems/concerns.    Roxy Horseman, PA-C

## 2023-05-06 ENCOUNTER — Encounter: Payer: Self-pay | Admitting: Psychiatry

## 2023-05-06 ENCOUNTER — Telehealth (INDEPENDENT_AMBULATORY_CARE_PROVIDER_SITE_OTHER): Payer: 59 | Admitting: Psychiatry

## 2023-05-06 ENCOUNTER — Other Ambulatory Visit: Payer: Self-pay

## 2023-05-06 DIAGNOSIS — F5105 Insomnia due to other mental disorder: Secondary | ICD-10-CM

## 2023-05-06 DIAGNOSIS — F411 Generalized anxiety disorder: Secondary | ICD-10-CM

## 2023-05-06 DIAGNOSIS — F900 Attention-deficit hyperactivity disorder, predominantly inattentive type: Secondary | ICD-10-CM | POA: Diagnosis not present

## 2023-05-06 DIAGNOSIS — F3342 Major depressive disorder, recurrent, in full remission: Secondary | ICD-10-CM

## 2023-05-06 MED ORDER — LISDEXAMFETAMINE DIMESYLATE 20 MG PO CAPS
20.0000 mg | ORAL_CAPSULE | Freq: Every morning | ORAL | 0 refills | Status: DC
Start: 2023-06-26 — End: 2023-09-11
  Filled 2023-07-30: qty 30, 30d supply, fill #0

## 2023-05-06 MED ORDER — LISDEXAMFETAMINE DIMESYLATE 20 MG PO CAPS
20.0000 mg | ORAL_CAPSULE | Freq: Every morning | ORAL | 0 refills | Status: DC
  Filled 2023-06-11: qty 30, 30d supply, fill #0

## 2023-05-06 MED ORDER — TRAZODONE HCL 50 MG PO TABS
25.0000 mg | ORAL_TABLET | Freq: Every evening | ORAL | 1 refills | Status: DC | PRN
Start: 2023-05-06 — End: 2023-06-11
  Filled 2023-05-06: qty 30, 30d supply, fill #0

## 2023-05-06 NOTE — Patient Instructions (Signed)
Trazodone Tablets What is this medication? TRAZODONE (TRAZ oh done) treats depression. It increases the amount of serotonin in the brain, a hormone that helps regulate mood. This medicine may be used for other purposes; ask your health care provider or pharmacist if you have questions. COMMON BRAND NAME(S): Desyrel What should I tell my care team before I take this medication? They need to know if you have any of these conditions: Bipolar disorder Bleeding disorder Glaucoma Heart disease, or previous heart attack Irregular heartbeat or rhythm Kidney disease Liver disease Low levels of sodium in the blood Suicidal thoughts, plans, or attempt by you or a family member An unusual or allergic reaction to trazodone, other medications, foods, dyes, or preservatives Pregnant or trying to get pregnant Breastfeeding How should I use this medication? Take this medication by mouth with a glass of water. Take it as directed on the prescription label at the same time every day. Take this medication shortly after a meal or a light snack. Keep taking this medication unless your care team tells you to stop. Stopping it too quickly can cause serious side effects. It can also make your condition worse. A special MedGuide will be given to you by the pharmacist with each prescription and refill. Be sure to read this information carefully each time. Talk to your care team about the use of this medication in children. Special care may be needed. Overdosage: If you think you have taken too much of this medicine contact a poison control center or emergency room at once. NOTE: This medicine is only for you. Do not share this medicine with others. What if I miss a dose? If you miss a dose, take it as soon as you can. If it is almost time for your next dose, take only that dose. Do not take double or extra doses. What may interact with this medication? Do not take this medication with any of the following: Certain  medications for fungal infections, such as fluconazole, itraconazole, ketoconazole, posaconazole, voriconazole Cisapride Dronedarone Linezolid MAOIs, such as Carbex, Eldepryl, Marplan, Nardil, and Parnate Mesoridazine Methylene blue (injected into a vein) Pimozide Saquinavir Thioridazine This medication may also interact with the following: Alcohol Antiviral medications for HIV or AIDS Aspirin and aspirin-like medications Barbiturates, such as phenobarbital Certain medications for blood pressure, heart disease, irregular heart beat Certain medications for mental health conditions Certain medications for migraine headache, such as almotriptan, eletriptan, frovatriptan, naratriptan, rizatriptan, sumatriptan, zolmitriptan Certain medications for seizures, such as carbamazepine and phenytoin Certain medications for sleep Certain medications that treat or prevent blood clots, such as dalteparin, enoxaparin, warfarin Digoxin Fentanyl Lithium NSAIDS, medications for pain and inflammation, such as ibuprofen or naproxen Other medications that cause heart rhythm changes Rasagiline Supplements, such as St. John's wort, kava kava, valerian Tramadol Tryptophan This list may not describe all possible interactions. Give your health care provider a list of all the medicines, herbs, non-prescription drugs, or dietary supplements you use. Also tell them if you smoke, drink alcohol, or use illegal drugs. Some items may interact with your medicine. What should I watch for while using this medication? Visit your care team for regular checks on your progress. Tell your care team if your symptoms do not start to get better or if they get worse. Because it may take several weeks to see the full effects of this medication, it is important to continue your treatment as prescribed by your care team. Watch for new or worsening thoughts of suicide or depression. This  includes sudden changes in mood, behaviors,  or thoughts. These changes can happen at any time but are more common in the beginning of treatment or after a change in dose. Call your care team right away if you experience these thoughts or worsening depression. This medication may cause mood and behavior changes, such as anxiety, nervousness, irritability, hostility, restlessness, excitability, hyperactivity, or trouble sleeping. These changes can happen at any time but are more common in the beginning of treatment or after a change in dose. Call your care team right away if you notice any of these symptoms. This medication may affect your coordination, reaction time, or judgment. Do not drive or operate machinery until you know how this medication affects you. Sit up or stand slowly to reduce the risk of dizzy or fainting spells. Drinking alcohol with this medication can increase the risk of these side effects. This medication may cause dry eyes and blurred vision. If you wear contact lenses you may feel some discomfort. Lubricating drops may help. See your care team if the problem does not go away or is severe. Your mouth may get dry. Chewing sugarless gum or sucking hard candy and drinking plenty of water may help. Contact your care team if the problem does not go away or is severe. What side effects may I notice from receiving this medication? Side effects that you should report to your care team as soon as possible: Allergic reactions--skin rash, itching, hives, swelling of the face, lips, tongue, or throat Bleeding--bloody or black, tar-like stools, red or dark brown urine, vomiting blood or brown material that looks like coffee grounds, small, red or purple spots on skin, unusual bleeding or bruising Heart rhythm changes--fast or irregular heartbeat, dizziness, feeling faint or lightheaded, chest pain, trouble breathing Low blood pressure--dizziness, feeling faint or lightheaded, blurry vision Low sodium level--muscle weakness, fatigue,  dizziness, headache, confusion Prolonged or painful erection Serotonin syndrome--irritability, confusion, fast or irregular heartbeat, muscle stiffness, twitching muscles, sweating, high fever, seizures, chills, vomiting, diarrhea Sudden eye pain or change in vision such as blurry vision, seeing halos around lights, vision loss Thoughts of suicide or self-harm, worsening mood, feelings of depression Side effects that usually do not require medical attention (report to your care team if they continue or are bothersome): Change in sex drive or performance Constipation Dizziness Drowsiness Dry mouth This list may not describe all possible side effects. Call your doctor for medical advice about side effects. You may report side effects to FDA at 1-800-FDA-1088. Where should I keep my medication? Keep out of the reach of children and pets. Store at room temperature between 15 and 30 degrees C (59 to 86 degrees F). Protect from light. Keep container tightly closed. Throw away any unused medication after the expiration date. NOTE: This sheet is a summary. It may not cover all possible information. If you have questions about this medicine, talk to your doctor, pharmacist, or health care provider.  2024 Elsevier/Gold Standard (2022-08-02 00:00:00)

## 2023-05-06 NOTE — Progress Notes (Signed)
Virtual Visit via Video Note  I connected with Rhonda [name] on 05/06/23 at  1:00 PM EDT by a video enabled telemedicine application and verified that I am speaking with the correct person using two identifiers.  Location Provider Location : [facility] Patient Location : Home  Participants: Patient , Provider   I discussed the limitations of evaluation and management by telemedicine and the availability of in person appointments. The patient expressed understanding and agreed to proceed.  I discussed the assessment and treatment plan with the patient. The patient was provided an opportunity to ask questions and all were answered. The patient agreed with the plan and demonstrated an understanding of the instructions.   The patient was advised to call back or seek an in-person evaluation if the symptoms worsen or if the condition fails to improve as anticipated.    BH MD OP Progress Note  05/06/2023 1:24 PM STORI PARAYNO  MRN:  [id number]  Chief Complaint:  Chief Complaint  Patient presents with   Follow-up   Anxiety   Depression   Medication Refill   HPI: Rhonda [name] is a 37 year old Caucasian female, divorced, employed, lives in [place], has a history of MDD, GAD, insomnia, ADHD was evaluated by telemedicine today.  Patient today reports overall mood wise she is doing fairly well.  She was able to reduce the dosage of Wellbutrin as discussed last visit.  She had mild mood symptoms which worsened initially however currently she is doing well.  Patient however continues to struggle with sleep.  She reports she goes to bed at around 11 PM and wakes up anywhere between 3 or 4 AM.  She only gets around 4 to 5 hours of sleep.  The Ambien does help sometimes.  She does not take it every night.  Patient agreeable to trial of trazodone.  Patient denies any suicidality, homicidality or perceptual disturbances.  Patient reports she is currently on Topamax to help with weight loss  and may have lost 10 pounds in the past several weeks.  Patient denies any other concerns today.  Visit Diagnosis:    ICD-10-CM   1. MDD (major depressive disorder), recurrent, in full remission (HCC)  F33.42     2. GAD (generalized anxiety disorder)  F41.1     3. Insomnia due to mental condition  F51.05 traZODone (DESYREL) 50 MG tablet   Multiple medications including Vyvanse, Wellbutrin as well as mood symptoms, ADHD    4. Attention deficit hyperactivity disorder (ADHD), predominantly inattentive type  F90.0 lisdexamfetamine (VYVANSE) 20 MG capsule    lisdexamfetamine (VYVANSE) 20 MG capsule      Past Psychiatric History: I have reviewed past psychiatric history from progress note on 11/05/2018.  Past Medical History:  Past Medical History:  Diagnosis Date   Depression    treated   PCOS (polycystic ovarian syndrome) 2008   Plantar fasciitis     Past Surgical History:  Procedure Laterality Date   CHOLECYSTECTOMY      Family Psychiatric History: I have reviewed family psychiatric history from progress note on 11/05/2018.  Family History:  Family History  Problem Relation Age of Onset   High blood pressure Mother    Depression Father    Liver disease Father    Alcoholism Father    Alcohol abuse Father    Diabetes Paternal Grandmother    Bipolar disorder Maternal Aunt     Social History: I have reviewed social history from progress note on 11/05/2018. Social History  Socioeconomic History   Marital status: Divorced    Spouse name: Christiane Ha   Number of children: 2   Years of education: Not on file   Highest education level: Bachelor's degree (e.g., BA, AB, BS)  Occupational History   Occupation: RN  Tobacco Use   Smoking status: Never   Smokeless tobacco: Never  Vaping Use   Vaping status: Never Used  Substance and Sexual Activity   Alcohol use: Yes    Alcohol/week: 1.0 standard drink of alcohol    Types: 1 Glasses of wine per week    Comment: Occasional  once a week.   Drug use: No   Sexual activity: Yes    Partners: Male    Birth control/protection: None, I.U.D.  Other Topics Concern   Not on file  Social History Narrative   Not on file   Social Determinants of Health   Financial Resource Strain: Low Risk  (06/11/2022)   Received from [facility], [facility]   Overall Financial Resource Strain (CARDIA)    Difficulty of Paying Living Expenses: Not hard at all  Food Insecurity: No Food Insecurity (06/11/2022)   Received from [facility], [facility]   Hunger Vital Sign    Worried About Running Out of Food in the Last Year: Never true    Ran Out of Food in the Last Year: Never true  Transportation Needs: No Transportation Needs (06/11/2022)   Received from [facility], [facility]   [facility] - Transportation    In the past 12 months, has lack of transportation kept you from medical appointments or from getting medications?: No    Lack of Transportation (Non-Medical): No  Physical Activity: Inactive (11/05/2018)   Exercise Vital Sign    Days of Exercise per Week: 0 days    Minutes of Exercise per Session: 0 min  Stress: Stress Concern Present (11/05/2018)   [employer] of Occupational Health - Occupational Stress Questionnaire    Feeling of Stress : Rather much  Social Connections: Unknown (11/05/2018)   Social Connection and Isolation Panel [NHANES]    Frequency of Communication with Friends and Family: Not on file    Frequency of Social Gatherings with Friends and Family: Not on file    Attends Religious Services: More than 4 times per year    Active Member of Clubs or Organizations: No    Attends Banker Meetings: Never    Marital Status: Married    Allergies: No Known Allergies  Metabolic Disorder Labs: Lab Results  Component Value Date   HGBA1C 5.0 01/24/2023   No results found for:  "PROLACTIN" Lab Results  Component Value Date   CHOL 143 04/02/2022   TRIG 68 04/02/2022   HDL 54 04/02/2022   CHOLHDL 2.6 04/02/2022   LDLCALC 75 04/02/2022   LDLCALC 76 03/07/2021   Lab Results  Component Value Date   TSH 1.680 01/24/2023   TSH 2.280 03/07/2021    Therapeutic Level Labs: No results found for: "LITHIUM" No results found for: "VALPROATE" No results found for: "CBMZ"  Current Medications: Current Outpatient Medications  Medication Sig Dispense Refill   [START ON 06/26/2023] lisdexamfetamine (VYVANSE) 20 MG capsule Take 1 capsule (20 mg total) by mouth in the morning. 30 capsule 0   traZODone (DESYREL) 50 MG tablet Take 0.5-1 tablet (25-50 mg total) by mouth at bedtime as needed for sleep. 30 tablet 1   benzonatate (TESSALON) 100 MG  capsule Take 1 capsule (100 mg total) by mouth 2 (two) times daily as needed for cough. 20 capsule 0   buPROPion ER (WELLBUTRIN SR) 100 MG 12 hr tablet Take 1 tablet (100 mg total) by mouth in the morning. 90 tablet 0   busPIRone (BUSPAR) 15 MG tablet Take 1 tablet (15 mg total) by mouth 2 (two) times daily. 180 tablet 1   ELDERBERRY PO Take by mouth.     hydrOXYzine (VISTARIL) 25 MG capsule Take 1-2 capsules (25-50 mg total) by mouth at bedtime as needed for sleep 180 capsule 0   levocetirizine (XYZAL) 5 MG tablet Take 5 mg by mouth every evening.     levonorgestrel (MIRENA) 20 MCG/24HR IUD 1 each by Intrauterine route once.     [START ON 05/28/2023] lisdexamfetamine (VYVANSE) 20 MG capsule Take 1 capsule (20 mg total) by mouth in the morning. 30 capsule 0   Multiple Vitamin (MULTIVITAMIN) tablet Take 1 tablet by mouth daily.     Probiotic Product (PROBIOTIC-10 PO) Take by mouth.     propranolol (INDERAL) 20 MG tablet Take 1 tablet (20 mg total) by mouth 2 (two) times daily as needed 60 tablet 1   spironolactone (ALDACTONE) 100 MG tablet Take 1 tablet (100 mg total) by mouth once daily 90 tablet 3   topiramate (TOPAMAX) 50 MG tablet  Take 1 tablet (50 mg total) by mouth daily. 30 tablet 0   triamcinolone cream (KENALOG) 0.1 % Apply twice daily to hands as needed. 45 g 1   Vitamin D, Ergocalciferol, (DRISDOL) 1.25 MG (50000 UNIT) CAPS capsule Take 1 capsule (50,000 Units total) by mouth every 7 (seven) days. 4 capsule 0   No current facility-administered medications for this visit.     Musculoskeletal: Strength & Muscle Tone:  UTA Gait & Station:  Seated Patient leans: N/A  Psychiatric Specialty Exam: Review of Systems  Psychiatric/Behavioral:  Positive for sleep disturbance.     There were no vitals taken for this visit.There is no height or weight on file to calculate BMI.  General Appearance: Fairly Groomed  Eye Contact:  Fair  Speech:  Clear and Coherent  Volume:  Normal  Mood:  Euthymic  Affect:  Congruent  Thought Process:  Goal Directed and Descriptions of Associations: Intact  Orientation:  Full (Time, Place, and Person)  Thought Content: Logical   Suicidal Thoughts:  No  Homicidal Thoughts:  No  Memory:  Immediate;   Fair Recent;   Fair Remote;   Fair  Judgement:  Fair  Insight:  Fair  Psychomotor Activity:  Normal  Concentration:  Concentration: Fair and Attention Span: Fair  Recall:  [employer] of Knowledge: Fair  Language: Fair  Akathisia:  No  Handed:  Right  AIMS (if indicated): not done  Assets:  Communication Skills Desire for Improvement Housing Social Support  ADL's:  Intact  Cognition: WNL  Sleep:  Poor   Screenings: GAD-7    Loss adjuster, chartered Office Visit from 03/18/2023 in [facility] Office Visit from 01/23/2023 in [facility] Video Visit from 12/07/2020 in [facility]  Total GAD-7 Score 4 12 1       PHQ2-9    Flowsheet Row Office Visit from 03/18/2023 in [facility] Office Visit from 01/23/2023 in [facility] Video Visit from 08/06/2022 in [facility] Office Visit from 01/04/2022 in [facility] Office Visit from 09/14/2021 in [facility]  PHQ-2 Total Score 1 3 0 2 0  PHQ-9 Total Score 8 14 -- 10 --      Flowsheet Row Video Visit from 05/06/2023 in [facility] Office Visit from 03/18/2023 in [facility] Office Visit from 01/23/2023 in [facility]  C-SSRS RISK CATEGORY No Risk No Risk No Risk        Assessment and Plan: STEPHANI FACCHINI is a 24 year old Caucasian female, divorced, employed, has a history of depression, anxiety, ADHD was evaluated by telemedicine today.  Patient is currently struggling with sleep, will benefit from following plan.  Plan MDD in remission Continue Wellbutrin SR 100 mg p.o. daily-dose reduced. Continue BuSpar 15 mg p.o. twice daily. L-methylfolate as prescribed   GAD-improving BuSpar 15 mg p.o. twice daily Hydroxyzine 25 mg p.o. daily as needed for severe anxiety attacks Propranolol 20 mg p.o. twice daily as needed Continue CBT with Ms.Tommy Rainwater.  Insomnia-unstable Discontinue Ambien for lack of benefit. Start trazodone 25-50 mg p.o. nightly as needed Advised patient to keep a sleep diary. She could also add over-the-counter medications like sleep #3, sleep #5, Relaxium. Patient to work on meditation, relaxation techniques at night.  ADHD-improving Vyvanse 20 mg p.o. daily. Reviewed [place] PMP AWARxE Discussed drug-drug interaction with topiramate, topiramate could increase the adverse effects of Vyvanse.  Patient advised to monitor.     Collaboration of Care: Collaboration of Care: Referral or follow-up with counselor/therapist AEB patient Delorise Shiner to continue  CBT  Patient/Guardian was advised Release of Information must be obtained prior to any record release in order to collaborate their care with an outside provider. Patient/Guardian was advised if they have not already done so to contact the registration department to sign all necessary forms in order for [name] to release information regarding their care.   Consent: Patient/Guardian gives verbal consent for treatment and assignment of benefits for services provided during this visit. Patient/Guardian expressed understanding and agreed to proceed.  Follow-up in clinic in 6 weeks or sooner if needed.  This note was generated in part or whole with voice recognition software. Voice recognition is usually quite accurate but there are transcription errors that can and very often do occur. I apologize for any typographical errors that were not detected and corrected.     Jomarie Longs, MD 05/06/2023, 1:24 PM

## 2023-05-15 ENCOUNTER — Encounter (INDEPENDENT_AMBULATORY_CARE_PROVIDER_SITE_OTHER): Payer: Self-pay | Admitting: Adult Health

## 2023-05-15 ENCOUNTER — Ambulatory Visit (INDEPENDENT_AMBULATORY_CARE_PROVIDER_SITE_OTHER): Payer: 59 | Admitting: Adult Health

## 2023-05-15 ENCOUNTER — Other Ambulatory Visit: Payer: Self-pay

## 2023-05-15 VITALS — BP 104/70 | HR 68 | Temp 98.8°F | Ht 70.0 in | Wt 251.0 lb

## 2023-05-15 DIAGNOSIS — Z6836 Body mass index (BMI) 36.0-36.9, adult: Secondary | ICD-10-CM

## 2023-05-15 DIAGNOSIS — R632 Polyphagia: Secondary | ICD-10-CM | POA: Diagnosis not present

## 2023-05-15 DIAGNOSIS — E559 Vitamin D deficiency, unspecified: Secondary | ICD-10-CM | POA: Diagnosis not present

## 2023-05-15 DIAGNOSIS — E669 Obesity, unspecified: Secondary | ICD-10-CM | POA: Diagnosis not present

## 2023-05-15 DIAGNOSIS — U071 COVID-19: Secondary | ICD-10-CM

## 2023-05-15 MED ORDER — TOPIRAMATE 50 MG PO TABS
50.0000 mg | ORAL_TABLET | Freq: Every day | ORAL | 0 refills | Status: DC
  Filled 2023-05-15 – 2023-05-28 (×2): qty 30, 30d supply, fill #0

## 2023-05-15 MED ORDER — VITAMIN D (ERGOCALCIFEROL) 1.25 MG (50000 UNIT) PO CAPS
50000.0000 [IU] | ORAL_CAPSULE | ORAL | 0 refills | Status: DC
  Filled 2023-05-15 – 2023-05-28 (×2): qty 4, 28d supply, fill #0

## 2023-05-15 NOTE — Progress Notes (Signed)
WEIGHT SUMMARY AND BIOMETRICS  Vitals Temp: 98.8 F (37.1 C) BP: 104/70 Pulse Rate: 68 SpO2: 100 %   Anthropometric Measurements Height: 5\' 10"  (1.778 m) Weight: 251 lb (113.9 kg) BMI (Calculated): 36.01 Weight at Last Visit: 252 lb Weight Lost Since Last Visit: 1 lb Weight Gained Since Last Visit: 0 Starting Weight: 273 lb Total Weight Loss (lbs): 22 lb (9.979 kg)   Body Composition  Body Fat %: 41.6 % Fat Mass (lbs): 104.8 lbs Muscle Mass (lbs): 139.6 lbs Total Body Water (lbs): 98.6 lbs Visceral Fat Rating : 10   Other Clinical Data Fasting: no Labs: no Today's Visit #: 70 Starting Date: 01/09/18    Chief Complaint:   OBESITY Rhonda [name] is here to discuss her progress with her obesity treatment plan. She is on the the Category 2 Plan and states she is following her eating plan approximately 75 % of the time. She states she is not currently exercising.   Interim History:  She tested + COVID-19 on 05/01/2023 She treated sx's with Tessalon and OTC remedies. She denies any acute respiratory COVID-19 sx's at present. She endorses lingering fatigue.  Exercise-she is still trying to establish regular exercise regime- you can do it!  Hydration-she will drink 40-120 oz water/day  Subjective:   1. COVID-19 She tested + COVID-19 on 05/01/2023 Video Visit for evaluation/treatment. She treated sx's with Tessalon and OTC remedies. She denies any acute respiratory COVID-19 sx's at present. She endorses lingering fatigue.  2. Polyphagia 03/13/2023- Started on Topiramate 25mg  at bedtime 04/15/2023- Increased Topiramate to 50mg  at bedtime   Birth Control- Mirena IUD and her boyfriend has had vasectomy   3. Vitamin D deficiency  Latest Reference Range & Units 04/02/22 11:04 10/04/22 09:04 01/24/23 09:39  Vitamin D, 25-Hydroxy 30.0 - 100.0 ng/mL 49.5 38.5 45.4   She is on weekly Ergocalciferol- denies N/V/Muscle Weakness   Assessment/Plan:   1.  COVID-19 Increase water and rest. Resume regular exercise when energy levels return to baseline  2. Polyphagia Refill topiramate (TOPAMAX) 50 MG tablet Take 1 tablet (50 mg total) by mouth daily. Dispense: 30 tablet, Refills: 0 of 0 remaining   3. Vitamin D deficiency Refill Vitamin D, Ergocalciferol, (DRISDOL) 1.25 MG (50000 UNIT) CAPS capsule Take 1 capsule (50,000 Units total) by mouth every 7 (seven) days. Dispense: 4 capsule, Refills: 0 of 0 remaining   4. Obesity, current BMI 36.1  Rhonda [name] is currently in the action stage of change. As such, her goal is to continue with weight loss efforts. She has agreed to the Category 2 Plan.   Exercise goals: For substantial health benefits, adults should do at least 150 minutes (2 hours and 30 minutes) a week of moderate-intensity, or 75 minutes (1 hour and 15 minutes) a week of vigorous-intensity aerobic physical activity, or an equivalent combination of moderate- and vigorous-intensity aerobic activity. Aerobic activity should be performed in episodes of at least 10 minutes, and preferably, it should be spread throughout the week.  Behavioral modification strategies: increasing lean protein intake, decreasing simple carbohydrates, increasing vegetables, increasing water intake, decreasing eating out, no skipping meals, meal planning and cooking strategies, keeping healthy foods in the home, ways to avoid night time snacking, better snacking choices, and planning for success.  Rhonda [name] has agreed to follow-up with our clinic in 4 weeks. She was informed of the importance of frequent follow-up visits to maximize her success with intensive lifestyle modifications for her multiple health conditions.   Objective:  Blood pressure 104/70, pulse 68, temperature 98.8 F (37.1 C), height 5\' 10"  (1.778 m), weight 251 lb (113.9 kg), SpO2 100%. Body mass index is 36.01 kg/m.  General: Cooperative, alert, well developed, in no acute distress. HEENT:  Conjunctivae and lids unremarkable. Cardiovascular: Regular rhythm.  Lungs: Normal work of breathing. Neurologic: No focal deficits.   Lab Results  Component Value Date   CREATININE 0.97 01/24/2023   BUN 11 01/24/2023   NA 139 01/24/2023   K 4.4 01/24/2023   CL 103 01/24/2023   CO2 22 01/24/2023   Lab Results  Component Value Date   ALT 24 01/24/2023   AST 26 01/24/2023   ALKPHOS 61 01/24/2023   BILITOT 0.8 01/24/2023   Lab Results  Component Value Date   HGBA1C 5.0 01/24/2023   HGBA1C 5.0 10/04/2022   HGBA1C 4.9 04/02/2022   HGBA1C 5.0 08/23/2021   HGBA1C 5.0 03/07/2021   Lab Results  Component Value Date   INSULIN 8.8 01/24/2023   INSULIN 10.8 10/04/2022   INSULIN 17.1 04/02/2022   INSULIN 7.0 08/23/2021   INSULIN 20.8 03/07/2021   Lab Results  Component Value Date   TSH 1.680 01/24/2023   Lab Results  Component Value Date   CHOL 143 04/02/2022   HDL 54 04/02/2022   LDLCALC 75 04/02/2022   TRIG 68 04/02/2022   CHOLHDL 2.6 04/02/2022   Lab Results  Component Value Date   VD25OH 45.4 01/24/2023   VD25OH 38.5 10/04/2022   VD25OH 49.5 04/02/2022   Lab Results  Component Value Date   WBC 4.7 01/09/2018   HGB 14.5 01/09/2018   HCT 42.1 01/09/2018   MCV 89 01/09/2018   PLT 195 09/20/2016   No results found for: "IRON", "TIBC", "FERRITIN"  Attestation Statements:   Reviewed by clinician on day of visit: allergies, medications, problem list, medical history, surgical history, family history, social history, and previous encounter notes.  I have reviewed the above documentation for accuracy and completeness, and I agree with the above. -  Elison Worrel d. Dagmar Adcox, NP-C

## 2023-05-21 DIAGNOSIS — L814 Other melanin hyperpigmentation: Secondary | ICD-10-CM | POA: Diagnosis not present

## 2023-05-21 DIAGNOSIS — D2239 Melanocytic nevi of other parts of face: Secondary | ICD-10-CM | POA: Diagnosis not present

## 2023-05-21 DIAGNOSIS — D2261 Melanocytic nevi of right upper limb, including shoulder: Secondary | ICD-10-CM | POA: Diagnosis not present

## 2023-05-21 DIAGNOSIS — D2272 Melanocytic nevi of left lower limb, including hip: Secondary | ICD-10-CM | POA: Diagnosis not present

## 2023-05-21 DIAGNOSIS — D225 Melanocytic nevi of trunk: Secondary | ICD-10-CM | POA: Diagnosis not present

## 2023-05-21 DIAGNOSIS — D2262 Melanocytic nevi of left upper limb, including shoulder: Secondary | ICD-10-CM | POA: Diagnosis not present

## 2023-05-21 DIAGNOSIS — L308 Other specified dermatitis: Secondary | ICD-10-CM | POA: Diagnosis not present

## 2023-05-21 DIAGNOSIS — D2271 Melanocytic nevi of right lower limb, including hip: Secondary | ICD-10-CM | POA: Diagnosis not present

## 2023-05-24 DIAGNOSIS — F4322 Adjustment disorder with anxiety: Secondary | ICD-10-CM | POA: Diagnosis not present

## 2023-05-24 DIAGNOSIS — F331 Major depressive disorder, recurrent, moderate: Secondary | ICD-10-CM | POA: Diagnosis not present

## 2023-05-27 ENCOUNTER — Other Ambulatory Visit: Payer: Self-pay

## 2023-05-28 ENCOUNTER — Other Ambulatory Visit: Payer: Self-pay

## 2023-05-28 DIAGNOSIS — F4322 Adjustment disorder with anxiety: Secondary | ICD-10-CM | POA: Diagnosis not present

## 2023-05-28 DIAGNOSIS — F331 Major depressive disorder, recurrent, moderate: Secondary | ICD-10-CM | POA: Diagnosis not present

## 2023-06-11 ENCOUNTER — Encounter (INDEPENDENT_AMBULATORY_CARE_PROVIDER_SITE_OTHER): Payer: Self-pay | Admitting: Adult Health

## 2023-06-11 ENCOUNTER — Ambulatory Visit (INDEPENDENT_AMBULATORY_CARE_PROVIDER_SITE_OTHER): Payer: 59 | Admitting: Adult Health

## 2023-06-11 ENCOUNTER — Other Ambulatory Visit: Payer: Self-pay

## 2023-06-11 VITALS — BP 121/75 | HR 87 | Temp 97.4°F | Ht 70.0 in | Wt 254.0 lb

## 2023-06-11 DIAGNOSIS — Z6836 Body mass index (BMI) 36.0-36.9, adult: Secondary | ICD-10-CM | POA: Diagnosis not present

## 2023-06-11 DIAGNOSIS — R632 Polyphagia: Secondary | ICD-10-CM

## 2023-06-11 DIAGNOSIS — R5383 Other fatigue: Secondary | ICD-10-CM | POA: Diagnosis not present

## 2023-06-11 DIAGNOSIS — Z6839 Body mass index (BMI) 39.0-39.9, adult: Secondary | ICD-10-CM

## 2023-06-11 DIAGNOSIS — F5105 Insomnia due to other mental disorder: Secondary | ICD-10-CM

## 2023-06-11 DIAGNOSIS — E669 Obesity, unspecified: Secondary | ICD-10-CM

## 2023-06-11 DIAGNOSIS — E559 Vitamin D deficiency, unspecified: Secondary | ICD-10-CM | POA: Diagnosis not present

## 2023-06-11 MED ORDER — ZOLPIDEM TARTRATE 5 MG PO TABS
5.0000 mg | ORAL_TABLET | Freq: Every evening | ORAL | 1 refills | Status: DC | PRN
  Filled 2023-06-11 (×2): qty 30, 30d supply, fill #0
  Filled 2023-07-21: qty 30, 30d supply, fill #1

## 2023-06-11 MED ORDER — VITAMIN D (ERGOCALCIFEROL) 1.25 MG (50000 UNIT) PO CAPS
50000.0000 [IU] | ORAL_CAPSULE | ORAL | 0 refills | Status: DC
  Filled 2023-06-11 – 2023-06-24 (×2): qty 4, 28d supply, fill #0

## 2023-06-11 NOTE — Progress Notes (Signed)
WEIGHT SUMMARY AND BIOMETRICS  Vitals Temp: (!) 97.4 F (36.3 C) BP: 121/75 Pulse Rate: 87 SpO2: 97 %   Anthropometric Measurements Height: 5\' 10"  (1.778 m) Weight: 254 lb (115.2 kg) BMI (Calculated): 36.45 Weight at Last Visit: 251lb Weight Lost Since Last Visit: 0 Weight Gained Since Last Visit: 3lb Starting Weight: 273lb Total Weight Loss (lbs): 19 lb (8.618 kg)   Body Composition  Body Fat %: 41.1 % Fat Mass (lbs): 104.8 lbs Muscle Mass (lbs): 142.4 lbs Total Body Water (lbs): 101.4 lbs Visceral Fat Rating : 10   Other Clinical Data Fasting: no Labs: no Today's Visit #: 103 Starting Date: 01/09/18    Chief Complaint:   OBESITY Rhonda [name] is here to discuss her progress with her obesity treatment plan. She is on the the Category 2 Plan and states she is following her eating plan approximately 50 % of the time. She states she is exercising Strength Training 60 minutes 2 times per week.   Interim History:  Rhonda [name] recently started Strength Training regime at her local gym- great job!  Reviewed Bioimpedance results with pt: Muscle Mass: +2.8 lbs Adipose Mass: No Change  She reports SE with nightly Topamax 50mg - ie: extremity paresthesia and aphasia.  Subjective:   1. Polyphagia 05/15/2023 started on nightly Topamax 50mg  She reports medication is ineffective and caused extremity paresthesia and aphasia. She would prefer to stop therapy.  Med Impact reviewed- appears that [facility] and Zepbound) will be covered in 2025.  2. Other fatigue She reports increased energy with recent addition of Strength Training at her gym.  3. Vit D Def  Latest Reference Range & Units 04/02/22 11:04 10/04/22 09:04 01/24/23 09:39  Vitamin D, 25-Hydroxy 30.0 - 100.0 ng/mL 49.5 38.5 45.4   She is on weekly Ergocalciferol- denies N/V/Muscle Weakness  Assessment/Plan:   1. Polyphagia Stop Topamax 50mg  QHS  2. Other fatigue Increase water Continue current  Ergocalciferol and OTC Multivitamin  3. Vit D Def Refill  Vitamin D, Ergocalciferol, (DRISDOL) 1.25 MG (50000 UNIT) CAPS capsule Take 1 capsule (50,000 Units total) by mouth every 7 (seven) days. Dispense: 4 capsule, Refills: 0 of 0 remaining   4.  Obesity, current BMI 36.45 Call Benefits and confirm Zepbound therapy is covered in 2025 Plan is to restart injection therapy when covered.  Rhonda [name] is currently in the action stage of change. As such, her goal is to continue with weight loss efforts. She has agreed to the Category 2 Plan.   Exercise goals: For substantial health benefits, adults should do at least 150 minutes (2 hours and 30 minutes) a week of moderate-intensity, or 75 minutes (1 hour and 15 minutes) a week of vigorous-intensity aerobic physical activity, or an equivalent combination of moderate- and vigorous-intensity aerobic activity. Aerobic activity should be performed in episodes of at least 10 minutes, and preferably, it should be spread throughout the week.  Behavioral modification strategies: increasing lean protein intake, decreasing simple carbohydrates, increasing vegetables, increasing water intake, decreasing eating out, no skipping meals, meal planning and cooking strategies, keeping healthy foods in the home, and avoiding temptations.  [name] has agreed to follow-up with our clinic in 4 weeks. She was informed of the importance of frequent follow-up visits to maximize her success with intensive lifestyle modifications for her multiple health conditions.   Objective:   Blood pressure 121/75, pulse 87, temperature (!) 97.4 F (36.3 C), height 5\' 10"  (1.778 m), weight 254 lb (115.2 kg), SpO2 97%. Body mass  index is 36.45 kg/m.  General: Cooperative, alert, well developed, in no acute distress. HEENT: Conjunctivae and lids unremarkable. Cardiovascular: Regular rhythm.  Lungs: Normal work of breathing. Neurologic: No focal deficits.   Lab Results  Component  Value Date   CREATININE 0.97 01/24/2023   BUN 11 01/24/2023   NA 139 01/24/2023   K 4.4 01/24/2023   CL 103 01/24/2023   CO2 22 01/24/2023   Lab Results  Component Value Date   ALT 24 01/24/2023   AST 26 01/24/2023   ALKPHOS 61 01/24/2023   BILITOT 0.8 01/24/2023   Lab Results  Component Value Date   HGBA1C 5.0 01/24/2023   HGBA1C 5.0 10/04/2022   HGBA1C 4.9 04/02/2022   HGBA1C 5.0 08/23/2021   HGBA1C 5.0 03/07/2021   Lab Results  Component Value Date   INSULIN 8.8 01/24/2023   INSULIN 10.8 10/04/2022   INSULIN 17.1 04/02/2022   INSULIN 7.0 08/23/2021   INSULIN 20.8 03/07/2021   Lab Results  Component Value Date   TSH 1.680 01/24/2023   Lab Results  Component Value Date   CHOL 143 04/02/2022   HDL 54 04/02/2022   LDLCALC 75 04/02/2022   TRIG 68 04/02/2022   CHOLHDL 2.6 04/02/2022   Lab Results  Component Value Date   VD25OH 45.4 01/24/2023   VD25OH 38.5 10/04/2022   VD25OH 49.5 04/02/2022   Lab Results  Component Value Date   WBC 4.7 01/09/2018   HGB 14.5 01/09/2018   HCT 42.1 01/09/2018   MCV 89 01/09/2018   PLT 195 09/20/2016   No results found for: "IRON", "TIBC", "FERRITIN"   Attestation Statements:   Reviewed by clinician on day of visit: allergies, medications, problem list, medical history, surgical history, family history, social history, and previous encounter notes.  I have reviewed the above documentation for accuracy and completeness, and I agree with the above. -  Mafalda Mcginniss d. Monasia Lair, NP-C

## 2023-06-12 ENCOUNTER — Other Ambulatory Visit: Payer: Self-pay

## 2023-06-18 ENCOUNTER — Encounter: Payer: Self-pay | Admitting: Psychiatry

## 2023-06-18 ENCOUNTER — Other Ambulatory Visit: Payer: Self-pay

## 2023-06-18 ENCOUNTER — Telehealth: Payer: 59 | Admitting: Psychiatry

## 2023-06-18 DIAGNOSIS — F900 Attention-deficit hyperactivity disorder, predominantly inattentive type: Secondary | ICD-10-CM

## 2023-06-18 DIAGNOSIS — F3342 Major depressive disorder, recurrent, in full remission: Secondary | ICD-10-CM | POA: Diagnosis not present

## 2023-06-18 DIAGNOSIS — F5105 Insomnia due to other mental disorder: Secondary | ICD-10-CM

## 2023-06-18 DIAGNOSIS — F411 Generalized anxiety disorder: Secondary | ICD-10-CM

## 2023-06-18 MED ORDER — BUPROPION HCL ER (SR) 100 MG PO TB12
100.0000 mg | ORAL_TABLET | Freq: Every morning | ORAL | 1 refills | Status: DC
  Filled 2023-06-18: qty 90, 90d supply, fill #0

## 2023-06-18 NOTE — Progress Notes (Unsigned)
Virtual Visit via Video Note  I connected with Rhonda [name] on 06/18/23 at  4:30 PM EDT by a video enabled telemedicine application and verified that I am speaking with the correct person using two identifiers.  Location Provider Location : [facility] Patient Location : Home  Participants: Patient , Provider    I discussed the limitations of evaluation and management by telemedicine and the availability of in person appointments. The patient expressed understanding and agreed to proceed.    I discussed the assessment and treatment plan with the patient. The patient was provided an opportunity to ask questions and all were answered. The patient agreed with the plan and demonstrated an understanding of the instructions.   The patient was advised to call back or seek an in-person evaluation if the symptoms worsen or if the condition fails to improve as anticipated.   BH MD OP Progress Note  06/19/2023 5:27 PM Rhonda [name]  MRN:  [id number]  Chief Complaint:  Chief Complaint  Patient presents with   Follow-up   Depression   Anxiety   Medication Refill   HPI: Rhonda [name] is a 37 year old Caucasian female, divorced, employed, lives in [place], has a history of MDD, GAD, insomnia, ADHD was evaluated by telemedicine today.  Patient today reports overall mood symptoms as stable.  She does have anxiety regarding the custody of her children that she shares with her ex-husband.  Patient reports she continues to have a lot of relationship struggles with him.  Patient however reports she has been working with her therapist, using mindfulness strategies, that helps.  She does struggle with her sleep.  She had to go back on the Ambien.  Trazodone made her groggy.  She wonders if she can take melatonin low dosage.  Patient reports attention and focus is good on the Vyvanse.  Denies side effects.  Patient denies any side effects to her current medication regimen.  She is compliant  on them.  Patient denies any suicidality, homicidality or perceptual disturbances.  Patient denies any other concerns today.  Visit Diagnosis:    ICD-10-CM   1. MDD (major depressive disorder), recurrent, in full remission (HCC)  F33.42 buPROPion ER (WELLBUTRIN SR) 100 MG 12 hr tablet    2. GAD (generalized anxiety disorder)  F41.1     3. Insomnia due to mental condition  F51.05    Multifactorial including mood symptoms    4. Attention deficit hyperactivity disorder (ADHD), predominantly inattentive type  F90.0       Past Psychiatric History: I have reviewed past psychiatric history from progress note on 11/05/2018.  Past Medical History:  Past Medical History:  Diagnosis Date   Depression    treated   PCOS (polycystic ovarian syndrome) 2008   Plantar fasciitis     Past Surgical History:  Procedure Laterality Date   CHOLECYSTECTOMY      Family Psychiatric History: Reviewed family psychiatric history from progress note on 11/05/2018.  Family History:  Family History  Problem Relation Age of Onset   High blood pressure Mother    Depression Father    Liver disease Father    Alcoholism Father    Alcohol abuse Father    Diabetes Paternal Grandmother    Bipolar disorder Maternal Aunt     Social History: Reviewed social history from progress note on 11/05/2018. Social History   Socioeconomic History   Marital status: Divorced    Spouse name: Christiane Ha   Number of children: 2   Years of  education: Not on file   Highest education level: Bachelor's degree (e.g., BA, AB, BS)  Occupational History   Occupation: RN  Tobacco Use   Smoking status: Never   Smokeless tobacco: Never  Vaping Use   Vaping status: Never Used  Substance and Sexual Activity   Alcohol use: Yes    Alcohol/week: 1.0 standard drink of alcohol    Types: 1 Glasses of wine per week    Comment: Occasional once a week.   Drug use: No   Sexual activity: Yes    Partners: Male    Birth  control/protection: None, I.U.D.  Other Topics Concern   Not on file  Social History Narrative   Not on file   Social Determinants of Health   Financial Resource Strain: Low Risk  (06/11/2022)   Received from [facility], [facility]   Overall Financial Resource Strain (CARDIA)    Difficulty of Paying Living Expenses: Not hard at all  Food Insecurity: No Food Insecurity (06/11/2022)   Received from [facility], [facility]   Hunger Vital Sign    Worried About Running Out of Food in the Last Year: Never true    Ran Out of Food in the Last Year: Never true  Transportation Needs: No Transportation Needs (06/11/2022)   Received from [facility], [facility]   [facility] - Transportation    In the past 12 months, has lack of transportation kept you from medical appointments or from getting medications?: No    Lack of Transportation (Non-Medical): No  Physical Activity: Inactive (11/05/2018)   Exercise Vital Sign    Days of Exercise per Week: 0 days    Minutes of Exercise per Session: 0 min  Stress: Stress Concern Present (11/05/2018)   [employer] of Occupational Health - Occupational Stress Questionnaire    Feeling of Stress : Rather much  Social Connections: Unknown (11/05/2018)   Social Connection and Isolation Panel [NHANES]    Frequency of Communication with Friends and Family: Not on file    Frequency of Social Gatherings with Friends and Family: Not on file    Attends Religious Services: More than 4 times per year    Active Member of Clubs or Organizations: No    Attends Banker Meetings: Never    Marital Status: Married    Allergies: No Known Allergies  Metabolic Disorder Labs: Lab Results  Component Value Date   HGBA1C 5.0 01/24/2023   No results found for: "PROLACTIN" Lab Results  Component Value Date   CHOL 143 04/02/2022   TRIG 68 04/02/2022    HDL 54 04/02/2022   CHOLHDL 2.6 04/02/2022   LDLCALC 75 04/02/2022   LDLCALC 76 03/07/2021   Lab Results  Component Value Date   TSH 1.680 01/24/2023   TSH 2.280 03/07/2021    Therapeutic Level Labs: No results found for: "LITHIUM" No results found for: "VALPROATE" No results found for: "CBMZ"  Current Medications: Current Outpatient Medications  Medication Sig Dispense Refill   buPROPion ER (WELLBUTRIN SR) 100 MG 12 hr tablet Take 1 tablet (100 mg total) by mouth in the morning. 90 tablet 1   busPIRone (BUSPAR) 15 MG tablet Take 1 tablet (15 mg total) by mouth 2 (two) times daily. 180 tablet 1   ELDERBERRY PO Take by mouth.     hydrOXYzine (VISTARIL) 25 MG capsule Take 1-2 capsules (25-50 mg total) by mouth at bedtime as needed for sleep  180 capsule 0   levocetirizine (XYZAL) 5 MG tablet Take 5 mg by mouth every evening.     levonorgestrel (MIRENA) 20 MCG/24HR IUD 1 each by Intrauterine route once.     lisdexamfetamine (VYVANSE) 20 MG capsule Take 1 capsule (20 mg total) by mouth in the morning. 30 capsule 0   [START ON 06/26/2023] lisdexamfetamine (VYVANSE) 20 MG capsule Take 1 capsule (20 mg total) by mouth in the morning. 30 capsule 0   Multiple Vitamin (MULTIVITAMIN) tablet Take 1 tablet by mouth daily.     Probiotic Product (PROBIOTIC-10 PO) Take by mouth.     propranolol (INDERAL) 20 MG tablet Take 1 tablet (20 mg total) by mouth 2 (two) times daily as needed 60 tablet 1   spironolactone (ALDACTONE) 100 MG tablet Take 1 tablet (100 mg total) by mouth daily. 90 tablet 3   triamcinolone cream (KENALOG) 0.1 % Apply twice daily to hands as needed. 45 g 1   Vitamin D, Ergocalciferol, (DRISDOL) 1.25 MG (50000 UNIT) CAPS capsule Take 1 capsule (50,000 Units total) by mouth every 7 (seven) days. 4 capsule 0   zolpidem (AMBIEN) 5 MG tablet Take 1 tablet (5 mg total) by mouth at bedtime as needed for sleep. 30 tablet 1   No current facility-administered medications for this visit.      Musculoskeletal: Strength & Muscle Tone:  UTA Gait & Station:  Seated Patient leans: N/A  Psychiatric Specialty Exam: Review of Systems  Psychiatric/Behavioral:  Positive for sleep disturbance. The patient is nervous/anxious.     There were no vitals taken for this visit.There is no height or weight on file to calculate BMI.  General Appearance: Fairly Groomed  Eye Contact:  Fair  Speech:  Clear and Coherent  Volume:  Normal  Mood:  Anxious  Affect:  Congruent  Thought Process:  Goal Directed and Descriptions of Associations: Intact  Orientation:  Full (Time, Place, and Person)  Thought Content: Logical   Suicidal Thoughts:  No  Homicidal Thoughts:  No  Memory:  Immediate;   Fair Recent;   Fair Remote;   Fair  Judgement:  Fair  Insight:  Fair  Psychomotor Activity:  Normal  Concentration:  Concentration: Fair and Attention Span: Fair  Recall:  [employer] of Knowledge: Fair  Language: Fair  Akathisia:  No  Handed:  Right  AIMS (if indicated): not done  Assets:  Desire for Improvement Housing Social Support Transportation  ADL's:  Intact  Cognition: WNL  Sleep:   some improvement   Screenings: GAD-7    Loss adjuster, chartered Office Visit from 03/18/2023 in [facility] Office Visit from 01/23/2023 in [facility] Video Visit from 12/07/2020 in [facility]  Total GAD-7 Score 4 12 1       PHQ2-9    Flowsheet Row Office Visit from 03/18/2023 in [name] Office Visit from 01/23/2023 in [facility] Video Visit from 08/06/2022 in [facility] Office Visit from 01/04/2022 in [facility] Office Visit from 09/14/2021 in [facility]  PHQ-2 Total Score 1 3 0 2 0   PHQ-9 Total Score 8 14 -- 10 --      Flowsheet Row Video Visit from 06/18/2023 in [facility] Video Visit from 05/06/2023 in [facility] Office Visit from 03/18/2023 in  [facility]  C-SSRS RISK CATEGORY No Risk No Risk No Risk        Assessment and Plan: Rhonda [name] is a 37 year old Caucasian female, divorced, employed, has a history of depression, anxiety, ADHD was evaluated by telemedicine today.  Patient is currently struggling with sleep although with some improvement on the Ambien, discussed plan as noted below.  Plan MDD in remission Wellbutrin SR 100 mg p.o. daily-reduced dosage BuSpar 15 mg p.o. twice daily L-methylfolate as prescribed.  GAD-improving BuSpar 15 mg p.o. twice daily Hydroxyzine 25 mg p.o. daily as needed for severe anxiety attacks Propranolol 20 mg p.o. twice daily as needed Continue CBT with Ms. Cari Sun  Insomnia-some improvement Continue Ambien 5 mg p.o. nightly as needed Discussed addition of melatonin, sleep #3 or sleep #5.  Patient to work on sleep hygiene techniques. We will consider reducing the dosage of Wellbutrin further in the future as needed.  ADHD-improving Continue Vyvanse 20 mg p.o. daily Reviewed [place] PMP AWARxE Will not increase the dosage at this time since patient has sleep issues, once sleep is stable, will consider readjusting the dosage of Vyvanse and reducing the dosage of Wellbutrin further or tapering off Wellbutrin.      Collaboration of Care: Collaboration of Care: Referral or follow-up with counselor/therapist AEB patient encouraged to continue CBT  Patient/Guardian was advised Release of Information must be obtained prior to any record release in order to collaborate their care with an outside provider. Patient/Guardian was advised if they have not already done so to contact the registration  department to sign all necessary forms in order for [name] to release information regarding their care.   Consent: Patient/Guardian gives verbal consent for treatment and assignment of benefits for services provided during this visit. Patient/Guardian expressed understanding and agreed to proceed.   Follow-up in clinic in 3 months or sooner in person.  This note was generated in part or whole with voice recognition software. Voice recognition is usually quite accurate but there are transcription errors that can and very often do occur. I apologize for any typographical errors that were not detected and corrected.    Jomarie Longs, MD 06/19/2023, 5:27 PM

## 2023-06-19 ENCOUNTER — Other Ambulatory Visit: Payer: Self-pay

## 2023-06-19 DIAGNOSIS — F900 Attention-deficit hyperactivity disorder, predominantly inattentive type: Secondary | ICD-10-CM | POA: Diagnosis not present

## 2023-06-19 DIAGNOSIS — F3341 Major depressive disorder, recurrent, in partial remission: Secondary | ICD-10-CM | POA: Diagnosis not present

## 2023-06-19 DIAGNOSIS — Z0000 Encounter for general adult medical examination without abnormal findings: Secondary | ICD-10-CM | POA: Diagnosis not present

## 2023-06-19 DIAGNOSIS — E282 Polycystic ovarian syndrome: Secondary | ICD-10-CM | POA: Diagnosis not present

## 2023-06-19 DIAGNOSIS — L680 Hirsutism: Secondary | ICD-10-CM | POA: Diagnosis not present

## 2023-06-19 DIAGNOSIS — M25512 Pain in left shoulder: Secondary | ICD-10-CM | POA: Diagnosis not present

## 2023-06-19 DIAGNOSIS — E559 Vitamin D deficiency, unspecified: Secondary | ICD-10-CM | POA: Diagnosis not present

## 2023-06-19 DIAGNOSIS — F411 Generalized anxiety disorder: Secondary | ICD-10-CM | POA: Diagnosis not present

## 2023-06-19 DIAGNOSIS — Z124 Encounter for screening for malignant neoplasm of cervix: Secondary | ICD-10-CM | POA: Diagnosis not present

## 2023-06-19 MED ORDER — SPIRONOLACTONE 100 MG PO TABS
100.0000 mg | ORAL_TABLET | Freq: Every day | ORAL | 3 refills | Status: DC
  Filled 2023-06-19: qty 90, 90d supply, fill #0
  Filled 2023-11-06: qty 90, 90d supply, fill #1
  Filled 2024-03-29: qty 90, 90d supply, fill #2

## 2023-06-24 ENCOUNTER — Other Ambulatory Visit: Payer: Self-pay

## 2023-06-25 DIAGNOSIS — F331 Major depressive disorder, recurrent, moderate: Secondary | ICD-10-CM | POA: Diagnosis not present

## 2023-06-25 DIAGNOSIS — F4322 Adjustment disorder with anxiety: Secondary | ICD-10-CM | POA: Diagnosis not present

## 2023-07-07 ENCOUNTER — Telehealth: Payer: 59 | Admitting: Physician Assistant

## 2023-07-07 DIAGNOSIS — L304 Erythema intertrigo: Secondary | ICD-10-CM | POA: Diagnosis not present

## 2023-07-08 ENCOUNTER — Other Ambulatory Visit: Payer: Self-pay

## 2023-07-08 MED ORDER — NYSTATIN 100000 UNIT/GM EX CREA
1.0000 | TOPICAL_CREAM | Freq: Two times a day (BID) | CUTANEOUS | 0 refills | Status: DC
  Filled 2023-07-08: qty 60, 30d supply, fill #0

## 2023-07-08 NOTE — Progress Notes (Signed)
E Visit for Rash  We are sorry that you are not feeling well. Here is how we plan to help!  Based upon your presentation it appears you have a fungal infection.  I have prescribed: and Nystatin cream apply to the affected area twice daily   HOME CARE:  Take cool showers and avoid direct sunlight. Apply cool compress or wet dressings. Take a bath in an oatmeal bath.  Sprinkle content of one Aveeno packet under running faucet with comfortably warm water.  Bathe for 15-20 minutes, 1-2 times daily.  Pat dry with a towel. Do not rub the rash. Use hydrocortisone cream. Take an antihistamine like Benadryl for widespread rashes that itch.  The adult dose of Benadryl is 25-50 mg by mouth 4 times daily. Caution:  This type of medication may cause sleepiness.  Do not drink alcohol, drive, or operate dangerous machinery while taking antihistamines.  Do not take these medications if you have prostate enlargement.  Read package instructions thoroughly on all medications that you take.  GET HELP RIGHT AWAY IF:  Symptoms don't go away after treatment. Severe itching that persists. If you rash spreads or swells. If you rash begins to smell. If it blisters and opens or develops a yellow-brown crust. You develop a fever. You have a sore throat. You become short of breath.  MAKE SURE YOU:  Understand these instructions. Will watch your condition. Will get help right away if you are not doing well or get worse.  Thank you for choosing an e-visit.  Your e-visit answers were reviewed by a board certified advanced clinical practitioner to complete your personal care plan. Depending upon the condition, your plan could have included both over the counter or prescription medications.  Please review your pharmacy choice. Make sure the pharmacy is open so you can pick up prescription now. If there is a problem, you may contact your provider through MyChart messaging and have the prescription routed to  another pharmacy.  Your safety is important to us. If you have drug allergies check your prescription carefully.   For the next 24 hours you can use MyChart to ask questions about today's visit, request a non-urgent call back, or ask for a work or school excuse. You will get an email in the next two days asking about your experience. I hope that your e-visit has been valuable and will speed your recovery.  I have spent 5 minutes in review of e-visit questionnaire, review and updating patient chart, medical decision making and response to patient.   Sherrol Vicars M Tamarick Kovalcik, PA-C  

## 2023-07-09 ENCOUNTER — Telehealth (INDEPENDENT_AMBULATORY_CARE_PROVIDER_SITE_OTHER): Payer: 59 | Admitting: Adult Health

## 2023-07-09 ENCOUNTER — Other Ambulatory Visit: Payer: Self-pay

## 2023-07-09 DIAGNOSIS — Z6836 Body mass index (BMI) 36.0-36.9, adult: Secondary | ICD-10-CM

## 2023-07-09 DIAGNOSIS — E669 Obesity, unspecified: Secondary | ICD-10-CM | POA: Diagnosis not present

## 2023-07-09 DIAGNOSIS — E559 Vitamin D deficiency, unspecified: Secondary | ICD-10-CM

## 2023-07-09 DIAGNOSIS — E66812 Obesity, class 2: Secondary | ICD-10-CM

## 2023-07-09 DIAGNOSIS — R632 Polyphagia: Secondary | ICD-10-CM

## 2023-07-09 DIAGNOSIS — E88819 Insulin resistance, unspecified: Secondary | ICD-10-CM

## 2023-07-09 MED ORDER — VITAMIN D (ERGOCALCIFEROL) 1.25 MG (50000 UNIT) PO CAPS
50000.0000 [IU] | ORAL_CAPSULE | ORAL | 0 refills | Status: DC
  Filled 2023-07-09: qty 4, 28d supply, fill #0

## 2023-07-09 NOTE — Progress Notes (Signed)
WEIGHT SUMMARY AND BIOMETRICS  No data recorded No data recorded No data recorded No data recorded  Chief Complaint:  I connected with  Rhonda [name] on 07/09/23 by a video and audio enabled telemedicine application and verified that I am speaking with the correct person using two identifiers.  Patient Location: Home  Provider Location: Office/Clinic  I discussed the limitations of evaluation and management by telemedicine. The patient expressed understanding and agreed to proceed.  OBESITY Dasja is here to discuss her progress with her obesity treatment plan. She is on the the Category 2 Plan and states she is following her eating plan approximately 50 % of the time.  She states she is not currently exercising.   Interim History:  Today's visit was converted to Virtual- her son is home ill today. He is experiencing URI sx's with a fever last night. Her daughter is reporting abdominal discomfort- however was well enough to attend school today.  She estimates to be following prescribed meal plan at least 50% and has been unable to engage in regular exercise regime  Subjective:   1. Polyphagia Ms. Harman feels that her appetite levels have been reasonably controlled the last 4 weeks. She has been off bedtime Topamax 50mg  for > 3 weeks  2. Vitamin D deficiency  Latest Reference Range & Units 04/02/22 11:04 10/04/22 09:04 01/24/23 09:39  Vitamin D, 25-Hydroxy 30.0 - 100.0 ng/mL 49.5 38.5 45.4   She is on weekly Ergocalciferol- denies N/V/Muscle Weakness  3. Insulin resistance  Latest Reference Range & Units 07/19/20 08:30 03/07/21 08:43 08/23/21 08:53 04/02/22 11:04 10/04/22 09:04 01/24/23 09:39  INSULIN 2.6 - 24.9 uIU/mL 34.0 (H) 20.8 7.0 17.1 10.8 8.8  (H): Data is abnormally high  Last dose of Wegovy 2.4mg  June 2024 She reports most success with appetite control and subsequent weight loss with Zepbound therapy. Currently her insurance will not cover either  of these AOM Rx  Assessment/Plan:   1. Polyphagia Increase protein at meals and snacks  2. Vitamin D deficiency Refill Vitamin D, Ergocalciferol, (DRISDOL) 1.25 MG (50000 UNIT) CAPS capsule Take 1 capsule (50,000 Units total) by mouth every 7 (seven) days. Dispense: 4 capsule, Refills: 0 of 0 remaining   3. Insulin resistance Increase protein at meals and snacks Increase compliance with Cat 2 Meal plan at least 80%  4. Obesity, current BMI 36.45  Joseph is currently in the action stage of change. As such, her goal is to continue with weight loss efforts. She has agreed to the Category 2 Plan.   Exercise goals: All adults should avoid inactivity. Some physical activity is better than none, and adults who participate in any amount of physical activity gain some health benefits. Adults should also include muscle-strengthening activities that involve all major muscle groups on 2 or more days a week.  Behavioral modification strategies: increasing lean protein intake, decreasing simple carbohydrates, increasing vegetables, increasing water intake, decreasing eating out, meal planning and cooking strategies, keeping healthy foods in the home, and planning for success.  Kasi has agreed to follow-up with our clinic in 4 weeks. She was informed of the importance of frequent follow-up visits to maximize her success with intensive lifestyle modifications for her multiple health conditions.    Objective:   There were no vitals taken for this visit. There is no height or weight on file to calculate BMI.  General: Cooperative, alert, well developed, in no acute distress. HEENT: Conjunctivae and lids unremarkable. Cardiovascular: Regular rhythm.  Lungs:  Normal work of breathing. Neurologic: No focal deficits.   Lab Results  Component Value Date   CREATININE 0.97 01/24/2023   BUN 11 01/24/2023   NA 139 01/24/2023   K 4.4 01/24/2023   CL 103 01/24/2023   CO2 22 01/24/2023   Lab  Results  Component Value Date   ALT 24 01/24/2023   AST 26 01/24/2023   ALKPHOS 61 01/24/2023   BILITOT 0.8 01/24/2023   Lab Results  Component Value Date   HGBA1C 5.0 01/24/2023   HGBA1C 5.0 10/04/2022   HGBA1C 4.9 04/02/2022   HGBA1C 5.0 08/23/2021   HGBA1C 5.0 03/07/2021   Lab Results  Component Value Date   INSULIN 8.8 01/24/2023   INSULIN 10.8 10/04/2022   INSULIN 17.1 04/02/2022   INSULIN 7.0 08/23/2021   INSULIN 20.8 03/07/2021   Lab Results  Component Value Date   TSH 1.680 01/24/2023   Lab Results  Component Value Date   CHOL 143 04/02/2022   HDL 54 04/02/2022   LDLCALC 75 04/02/2022   TRIG 68 04/02/2022   CHOLHDL 2.6 04/02/2022   Lab Results  Component Value Date   VD25OH 45.4 01/24/2023   VD25OH 38.5 10/04/2022   VD25OH 49.5 04/02/2022   Lab Results  Component Value Date   WBC 4.7 01/09/2018   HGB 14.5 01/09/2018   HCT 42.1 01/09/2018   MCV 89 01/09/2018   PLT 195 09/20/2016   No results found for: "IRON", "TIBC", "FERRITIN"   Attestation Statements:   Reviewed by clinician on day of visit: allergies, medications, problem list, medical history, surgical history, family history, social history, and previous encounter notes.  Time spent on visit including pre-visit chart review and post-visit care and charting was 18 minutes.  I have reviewed the above documentation for accuracy and completeness, and I agree with the above. -  Laranda Burkemper d. Cayne Yom, NP-C

## 2023-07-11 ENCOUNTER — Ambulatory Visit (INDEPENDENT_AMBULATORY_CARE_PROVIDER_SITE_OTHER): Payer: 59 | Admitting: Adult Health

## 2023-07-12 DIAGNOSIS — F331 Major depressive disorder, recurrent, moderate: Secondary | ICD-10-CM | POA: Diagnosis not present

## 2023-07-12 DIAGNOSIS — F4322 Adjustment disorder with anxiety: Secondary | ICD-10-CM | POA: Diagnosis not present

## 2023-07-15 ENCOUNTER — Telehealth (INDEPENDENT_AMBULATORY_CARE_PROVIDER_SITE_OTHER): Payer: Self-pay | Admitting: Adult Health

## 2023-07-15 NOTE — Telephone Encounter (Signed)
Pt was called and as willing to schedule with Dr. Lawson Radar on 08/05/2023 @ 8:40.

## 2023-07-15 NOTE — Telephone Encounter (Signed)
Pt is calling in wanting to be seen the week of December 16th and there is no availability until Jan. 2, 2025 pt is upset that she can't be seen until then.  Pt would like to have a call back.

## 2023-07-22 ENCOUNTER — Other Ambulatory Visit: Payer: Self-pay

## 2023-07-23 DIAGNOSIS — F331 Major depressive disorder, recurrent, moderate: Secondary | ICD-10-CM | POA: Diagnosis not present

## 2023-07-23 DIAGNOSIS — F4322 Adjustment disorder with anxiety: Secondary | ICD-10-CM | POA: Diagnosis not present

## 2023-07-30 ENCOUNTER — Other Ambulatory Visit: Payer: Self-pay | Admitting: Psychiatry

## 2023-07-30 ENCOUNTER — Other Ambulatory Visit: Payer: Self-pay

## 2023-07-30 DIAGNOSIS — F900 Attention-deficit hyperactivity disorder, predominantly inattentive type: Secondary | ICD-10-CM

## 2023-08-05 ENCOUNTER — Other Ambulatory Visit: Payer: Self-pay

## 2023-08-05 ENCOUNTER — Encounter (INDEPENDENT_AMBULATORY_CARE_PROVIDER_SITE_OTHER): Payer: Self-pay | Admitting: Family Medicine

## 2023-08-05 ENCOUNTER — Ambulatory Visit (INDEPENDENT_AMBULATORY_CARE_PROVIDER_SITE_OTHER): Payer: 59 | Admitting: Family Medicine

## 2023-08-05 VITALS — BP 116/77 | HR 73 | Temp 97.7°F | Ht 70.0 in | Wt 262.0 lb

## 2023-08-05 DIAGNOSIS — F32A Depression, unspecified: Secondary | ICD-10-CM | POA: Diagnosis not present

## 2023-08-05 DIAGNOSIS — F419 Anxiety disorder, unspecified: Secondary | ICD-10-CM | POA: Diagnosis not present

## 2023-08-05 DIAGNOSIS — E669 Obesity, unspecified: Secondary | ICD-10-CM

## 2023-08-05 DIAGNOSIS — R632 Polyphagia: Secondary | ICD-10-CM

## 2023-08-05 DIAGNOSIS — E66812 Obesity, class 2: Secondary | ICD-10-CM

## 2023-08-05 DIAGNOSIS — Z6837 Body mass index (BMI) 37.0-37.9, adult: Secondary | ICD-10-CM | POA: Diagnosis not present

## 2023-08-05 DIAGNOSIS — E559 Vitamin D deficiency, unspecified: Secondary | ICD-10-CM

## 2023-08-05 MED ORDER — SERTRALINE HCL 50 MG PO TABS
50.0000 mg | ORAL_TABLET | Freq: Every day | ORAL | 0 refills | Status: DC
  Filled 2023-08-05: qty 90, 90d supply, fill #0

## 2023-08-05 MED ORDER — TOPIRAMATE 25 MG PO TABS
ORAL_TABLET | ORAL | 0 refills | Status: DC
  Filled 2023-08-05: qty 45, 29d supply, fill #0

## 2023-08-05 MED ORDER — LOMAIRA 8 MG PO TABS
ORAL_TABLET | ORAL | 0 refills | Status: DC
  Filled 2023-08-05: qty 23, 30d supply, fill #0

## 2023-08-05 NOTE — Assessment & Plan Note (Signed)
 Discussed importance of vitamin d supplementation.  Vitamin d supplementation has been shown to decrease fatigue, decrease risk of progression to insulin resistance and then prediabetes, decreases risk of falling in older age and can even assist in decreasing depressive symptoms in PTSD.   Prescription for Vitamin D sent in.

## 2023-08-05 NOTE — Assessment & Plan Note (Signed)
Patient is on wellbutrin 100mg , buspar, hydroxyzine (for sleep) and propranolol (as needed for certain situations).  She sees psychiatry.  She denies SI and HI.  She voices she doesn't feel her anxiety is as managed as it could be.  She is open to additional medication options to help bring the baseline of her anxiety down.  Will start sertraline 25mg  daily for 5 days then increase to 50mg  daily.

## 2023-08-05 NOTE — Progress Notes (Signed)
SUBJECTIVE:  Chief Complaint: Obesity  Interim History: Patient has been eating out more frequently.  She feels like she has given up.  She is staying local for most of the next month.  Doing a Christmas breakfast for the holiday.  She does eat out frequently the weeks she doesn't have her kids.  She previously meal prepped.  Rhonda [name] is here to discuss her progress with her obesity treatment plan. She is on the Category 2 Plan and states she is following her eating plan approximately 50 % of the time. She states she is not exercising.   OBJECTIVE: Visit Diagnoses: Problem List Items Addressed This Visit       Other   Vitamin D deficiency   Discussed importance of vitamin d supplementation.  Vitamin d supplementation has been shown to decrease fatigue, decrease risk of progression to insulin resistance and then prediabetes, decreases risk of falling in older age and can even assist in decreasing depressive symptoms in PTSD.   Prescription for Vitamin D sent in.        Anxiety and depression   Patient is on wellbutrin 100mg , buspar, hydroxyzine (for sleep) and propranolol (as needed for certain situations).  She sees psychiatry.  She denies SI and HI.  She voices she doesn't feel her anxiety is as managed as it could be.  She is open to additional medication options to help bring the baseline of her anxiety down.  Will start sertraline 25mg  daily for 5 days then increase to 50mg  daily.      Relevant Medications   sertraline (ZOLOFT) 50 MG tablet   Polyphagia - Primary   Medication options were discussed extensively with patient today.  Given availability, cost, insurance coverage and other medical comorbidities the decision was reached to start medications Lomaira and Topiramate.  These medications will be used as a substitute for brand name Qsymia in doses that are synanomous.  Patient understands this is an off label usage.  We discussed the titration schedule with the goal of 5%  weight loss at 3 months at a treatment dose.  The first two weeks will be a starting dose of 25mg  of Topiramate and 4mg  of Lomaira.  After two weeks the patient will increase to 50mg  of Topiramate and 8mg  of Lomaira and will stay on this dose until the next appointment.  Controlled substance contract was discussed and signed today.  Prescriptions sent in. PDMP checked with no concerns noted.  Patient is on Vyvanse for ADHD and we discussed possibility of side effects from additional stimulant.  She will mychart if she develops any side effects.       Relevant Medications   Phentermine HCl (LOMAIRA) 8 MG TABS   topiramate (TOPAMAX) 25 MG tablet    Vitals Temp: 97.7 F (36.5 C) BP: 116/77 Pulse Rate: 73 SpO2: 99 %   Anthropometric Measurements Height: 5\' 10"  (1.778 m) Weight: 262 lb (118.8 kg) BMI (Calculated): 37.59 Weight at Last Visit: 254 lb Weight Lost Since Last Visit: 0 Weight Gained Since Last Visit: 8 lb Starting Weight: 273 lb Total Weight Loss (lbs): 11 lb (4.99 kg)   Body Composition  Body Fat %: 43.6 % Fat Mass (lbs): 114.2 lbs Muscle Mass (lbs): 140.4 lbs Total Body Water (lbs): 100 lbs Visceral Fat Rating : 11   Other Clinical Data Today's Visit #: 66 Starting Date: 01/09/18     ASSESSMENT AND PLAN:  Diet: Rhonda [name] is currently in the action stage of change. As such, her  goal is to continue with weight loss efforts. She has agreed to Category 2 Plan.  Exercise: Rhonda [name] has been instructed that some exercise is better than none for weight loss and overall health benefits.   Behavior Modification:  We discussed the following Behavioral Modification Strategies today: increasing lean protein intake, increasing vegetables, meal planning and cooking strategies, better snacking choices, holiday eating strategies, and planning for success. We discussed various medication options to help Rhonda [name] with her weight loss efforts and we both agreed to start  makeshift qsymia at 4/25 for 2 weeks then increase to 8/50 until the next appointment.  Return in about 4 weeks (around 09/02/2023) for fasting labs and IC.Marland Kitchen She was informed of the importance of frequent follow up visits to maximize her success with intensive lifestyle modifications for her multiple health conditions.  Attestation Statements:   Reviewed by clinician on day of visit: allergies, medications, problem list, medical history, surgical history, family history, social history, and previous encounter notes.    Reuben Likes, MD

## 2023-08-05 NOTE — Assessment & Plan Note (Signed)
 Medication options were discussed extensively with patient today.  Given availability, cost, insurance coverage and other medical comorbidities the decision was reached to start medications Lomaira and Topiramate.  These medications will be used as a substitute for brand name Qsymia in doses that are synanomous.  Patient understands this is an off label usage.  We discussed the titration schedule with the goal of 5% weight loss at 3 months at a treatment dose.  The first two weeks will be a starting dose of 25mg  of Topiramate and 4mg  of Lomaira.  After two weeks the patient will increase to 50mg  of Topiramate and 8mg  of Lomaira and will stay on this dose until the next appointment.  Controlled substance contract was discussed and signed today.  Prescriptions sent in.

## 2023-08-12 DIAGNOSIS — F331 Major depressive disorder, recurrent, moderate: Secondary | ICD-10-CM | POA: Diagnosis not present

## 2023-08-12 DIAGNOSIS — F4322 Adjustment disorder with anxiety: Secondary | ICD-10-CM | POA: Diagnosis not present

## 2023-08-19 ENCOUNTER — Other Ambulatory Visit: Payer: Self-pay

## 2023-08-27 ENCOUNTER — Encounter: Payer: Self-pay | Admitting: Psychiatry

## 2023-08-27 ENCOUNTER — Other Ambulatory Visit: Payer: Self-pay

## 2023-08-27 ENCOUNTER — Ambulatory Visit (INDEPENDENT_AMBULATORY_CARE_PROVIDER_SITE_OTHER): Payer: 59 | Admitting: Psychiatry

## 2023-08-27 VITALS — BP 117/82 | HR 78 | Temp 97.9°F | Ht 70.0 in | Wt 263.4 lb

## 2023-08-27 DIAGNOSIS — F411 Generalized anxiety disorder: Secondary | ICD-10-CM | POA: Diagnosis not present

## 2023-08-27 DIAGNOSIS — F900 Attention-deficit hyperactivity disorder, predominantly inattentive type: Secondary | ICD-10-CM

## 2023-08-27 DIAGNOSIS — F5105 Insomnia due to other mental disorder: Secondary | ICD-10-CM

## 2023-08-27 DIAGNOSIS — F3342 Major depressive disorder, recurrent, in full remission: Secondary | ICD-10-CM

## 2023-08-27 MED ORDER — ZOLPIDEM TARTRATE ER 6.25 MG PO TBCR
6.2500 mg | EXTENDED_RELEASE_TABLET | Freq: Every evening | ORAL | 0 refills | Status: DC | PRN
  Filled 2023-08-27: qty 30, 30d supply, fill #0

## 2023-08-27 NOTE — Progress Notes (Signed)
 BH MD OP Progress Note  08/27/2023 5:27 PM Rhonda [name]  MRN:  [id number]  Chief Complaint:  Chief Complaint  Patient presents with   Follow-up   Depression   Anxiety   ADD   Insomnia   Medication Refill   HPI: Rhonda [name] is a 38 year old Caucasian female, divorced, employed, lives in [place], has a history of MDD, GAD, insomnia, ADHD was evaluated in office today.   The patient, with a history of anxiety, ADHD, and insomnia, reports recent initiation of Zoloft  by her weight loss doctor. She expresses uncertainty about the efficacy of this medication, given her genetic testing results from 2019 suggesting reduced efficacy of SSRIs due to her genotype. She has been on Zoloft  for approximately three weeks and reports no significant changes or side effects thus far.  The patient's anxiety continues to be a significant issue, primarily driven by personal life stressors, including co-parenting and financial concerns. She also reports feeling behind at work, which adds to her stress. She describes her work environment as highly independent, lacking structure, which she believes exacerbates her ADHD symptoms.  Regarding her ADHD, the patient is currently on Vyvanse  and reports only minimal improvement. She expresses a need for better time management skills, acknowledging that medication alone is not sufficient. She also mentions being on phentermine , prescribed by her weight loss doctor, and expresses concern about the number of stimulants she is taking.  The patient's insomnia remains a significant issue. She reports difficulty staying asleep, even with the use of Ambien . She has tried trazodone  in the past but found it to be too long-lasting. She has also tried melatonin, which makes her sleepy but does not help her fall asleep. She expresses a desire to avoid long-term use of Ambien  due to its habit-forming nature.  In terms of therapy, the patient is currently seeing a therapist  but feels her sessions are more like venting sessions rather than providing practical strategies for managing her anxiety and ADHD. She expresses a need for more strategic advice and is considering finding a new therapist or a life coach.  Patient currently denies any suicidality, homicidality or perceptual disturbances.  Visit Diagnosis:    ICD-10-CM   1. Recurrent major depressive disorder, in full remission (HCC)  F33.42     2. GAD (generalized anxiety disorder)  F41.1     3. Insomnia due to mental condition  F51.05 zolpidem  (AMBIEN  CR) 6.25 MG CR tablet   anxiety, depression    4. Attention deficit hyperactivity disorder (ADHD), predominantly inattentive type  F90.0       Past Psychiatric History: I have reviewed past psychiatric history from progress note on 11/05/2018.  Past Medical History:  Past Medical History:  Diagnosis Date   Depression    treated   PCOS (polycystic ovarian syndrome) 2008   Plantar fasciitis     Past Surgical History:  Procedure Laterality Date   CHOLECYSTECTOMY      Family Psychiatric History: I have reviewed family psychiatric history from progress note on 11/05/2018.  Family History:  Family History  Problem Relation Age of Onset   High blood pressure Mother    Depression Father    Liver disease Father    Alcoholism Father    Alcohol abuse Father    Diabetes Paternal Grandmother    Bipolar disorder Maternal Aunt     Social History: I have reviewed social history from progress note on 11/05/2018. Social History   Socioeconomic History   Marital status: Divorced  Spouse name: Dorn   Number of children: 2   Years of education: Not on file   Highest education level: Bachelor's degree (e.g., BA, AB, BS)  Occupational History   Occupation: RN  Tobacco Use   Smoking status: Never   Smokeless tobacco: Never  Vaping Use   Vaping status: Never Used  Substance and Sexual Activity   Alcohol use: Yes    Alcohol/week: 1.0 standard  drink of alcohol    Types: 1 Glasses of wine per week    Comment: Occasional once a week.   Drug use: No   Sexual activity: Yes    Partners: Male    Birth control/protection: None, I.U.D.  Other Topics Concern   Not on file  Social History Narrative   Not on file   Social Drivers of Health   Financial Resource Strain: Low Risk  (06/19/2023)   Received from [facility]   Overall Financial Resource Strain (CARDIA)    Difficulty of Paying Living Expenses: Not very hard  Food Insecurity: No Food Insecurity (06/19/2023)   Received from [facility]   Hunger Vital Sign    Worried About Running Out of Food in the Last Year: Never true    Ran Out of Food in the Last Year: Never true  Transportation Needs: No Transportation Needs (06/19/2023)   Received from [facility] - Transportation    In the past 12 months, has lack of transportation kept you from medical appointments or from getting medications?: No    Lack of Transportation (Non-Medical): No  Physical Activity: Inactive (11/05/2018)   Exercise Vital Sign    Days of Exercise per Week: 0 days    Minutes of Exercise per Session: 0 min  Stress: Stress Concern Present (11/05/2018)   [employer] of [employer]ational [employer] - Occupational Stress Questionnaire    Feeling of Stress : Rather much  Social Connections: Unknown (11/05/2018)   Social Connection and Isolation Panel [NHANES]    Frequency of Communication with Friends and Family: Not on file    Frequency of Social Gatherings with Friends and Family: Not on file    Attends Religious Services: More than 4 times per year    Active Member of Clubs or Organizations: No    Attends Banker Meetings: Never    Marital Status: Married    Allergies: No Known Allergies  Metabolic Disorder Labs: Lab Results  Component Value Date   HGBA1C 5.0 01/24/2023   No results found for: PROLACTIN Lab Results   Component Value Date   CHOL 143 04/02/2022   TRIG 68 04/02/2022   HDL 54 04/02/2022   CHOLHDL 2.6 04/02/2022   LDLCALC 75 04/02/2022   LDLCALC 76 03/07/2021   Lab Results  Component Value Date   TSH 1.680 01/24/2023   TSH 2.280 03/07/2021    Therapeutic Level Labs: No results found for: LITHIUM No results found for: VALPROATE No results found for: CBMZ  Current Medications: Current Outpatient Medications  Medication Sig Dispense Refill   buPROPion  ER (WELLBUTRIN  SR) 100 MG 12 hr tablet Take 1 tablet (100 mg total) by mouth in the morning. 90 tablet 1   busPIRone  (BUSPAR ) 15 MG tablet Take 1 tablet (15 mg total) by mouth 2 (two) times daily. 180 tablet 1   ELDERBERRY PO Take by mouth.     hydrOXYzine  (VISTARIL ) 25 MG capsule Take 1-2 capsules (25-50 mg total) by mouth at bedtime as needed for sleep  180 capsule 0   levocetirizine (XYZAL) 5 MG tablet Take 5 mg by mouth every evening.     levonorgestrel  (MIRENA ) 20 MCG/24HR IUD 1 each by Intrauterine route once.     lisdexamfetamine (VYVANSE ) 20 MG capsule Take 1 capsule (20 mg total) by mouth in the morning. 30 capsule 0   lisdexamfetamine (VYVANSE ) 20 MG capsule Take 1 capsule (20 mg total) by mouth in the morning. 30 capsule 0   Multiple Vitamin (MULTIVITAMIN) tablet Take 1 tablet by mouth daily.     nystatin  cream (MYCOSTATIN ) Apply 1 Application topically 2 (two) times daily. 60 g 0   Phentermine  HCl (LOMAIRA ) 8 MG TABS Take 0.5 tablets (4 mg total) by mouth daily for 14 days, THEN 1 tablet (8 mg total) daily for 16 days. 23 tablet 0   Probiotic Product (PROBIOTIC-10 PO) Take by mouth.     propranolol  (INDERAL ) 20 MG tablet Take 1 tablet (20 mg total) by mouth 2 (two) times daily as needed 60 tablet 1   sertraline  (ZOLOFT ) 50 MG tablet Take 1 tablet (50 mg total) by mouth daily. 90 tablet 0   spironolactone  (ALDACTONE ) 100 MG tablet Take 1 tablet (100 mg total) by mouth daily. 90 tablet 3   topiramate  (TOPAMAX ) 25 MG  tablet Take 1 tablet (25 mg total) by mouth daily for 14 days, THEN 2 tablets (50 mg total) daily for 16 days. 45 tablet 0   triamcinolone  cream (KENALOG ) 0.1 % Apply twice daily to hands as needed. 45 g 1   Vitamin D , Ergocalciferol , (DRISDOL ) 1.25 MG (50000 UNIT) CAPS capsule Take 1 capsule (50,000 Units total) by mouth every 7 (seven) days. 4 capsule 0   zolpidem  (AMBIEN  CR) 6.25 MG CR tablet Take 1 tablet (6.25 mg total) by mouth at bedtime as needed for sleep. 30 tablet 0   No current facility-administered medications for this visit.     Musculoskeletal: Strength & Muscle Tone: within normal limits Gait & Station: normal Patient leans: N/A  Psychiatric Specialty Exam: Review of Systems  Psychiatric/Behavioral:  Positive for decreased concentration and sleep disturbance. The patient is nervous/anxious.     Blood pressure 117/82, pulse 78, temperature 97.9 F (36.6 C), temperature source Skin, height 5' 10 (1.778 m), weight 263 lb 6.4 oz (119.5 kg).Body mass index is 37.79 kg/m.  General Appearance: Fairly Groomed  Eye Contact:  Fair  Speech:  Clear and Coherent  Volume:  Normal  Mood:  Anxious  Affect:  Appropriate  Thought Process:  Goal Directed and Descriptions of Associations: Intact  Orientation:  Full (Time, Place, and Person)  Thought Content: Logical   Suicidal Thoughts:  No  Homicidal Thoughts:  No  Memory:  Immediate;   Fair Recent;   Fair Remote;   Fair  Judgement:  Fair  Insight:  Fair  Psychomotor Activity:  Normal  Concentration:  Concentration: Fair and Attention Span: Fair  Recall:  [employer] of Knowledge: Fair  Language: Fair  Akathisia:  No  Handed:  Right  AIMS (if indicated): not done  Assets:  Communication Skills Desire for Improvement Housing Social Support  ADL's:  Intact  Cognition: WNL  Sleep:  Poor   Screenings: GAD-7    Loss Adjuster, Chartered Office Visit from 08/27/2023 in [facility] Office Visit  from 03/18/2023 in [facility] Office Visit from 01/23/2023 in [facility] Video Visit from 12/07/2020 in [facility]  Total GAD-7 Score 5 4 12 1       PHQ2-9    Flowsheet Row Office Visit from 08/27/2023 in [facility] Office Visit from 03/18/2023 in [facility] Office Visit from 01/23/2023 in [facility] Video Visit from 08/06/2022 in [facility] Office Visit from 01/04/2022 in [facility]  PHQ-2 Total Score 1 1 3  0 2  PHQ-9 Total Score -- 8 14 -- 10      Flowsheet Row Office Visit from 08/27/2023 in [facility] Video Visit from 06/18/2023 in [facility] Video Visit from 05/06/2023 in [facility]  C-SSRS RISK CATEGORY No Risk No Risk No Risk        Assessment and Plan: Rhonda [name] is a 38 year old Caucasian female, divorced, employed, has a history of depression, anxiety, ADHD was evaluated in office today.  Patient is currently struggling with anxiety, sleep problems as well as ADHD symptoms, discussed assessment and plan as noted below.    Generalized Anxiety Disorder-unstable Ongoing anxiety related to personal life stressors, including co-parenting and financial issues. Started on sertraline ; too soon to assess efficacy. Previously used buspirone , which is short-acting. Genetic testing indicates reduced likelihood of response to SSRIs due to decreased serotonin transporter expression.  - Continue sertraline  50 mg daily and reassess in 8 weeks - Avoid adding multiple medications without assessing efficacy - Continue BuSpar  15 mg twice daily for now.   Will consider weaning of BuSpar  in the future if good benefit from sertraline . - Consider therapy for anxiety management - Schedule follow-up in 2-3 weeks, virtual  Attention-Deficit/Hyperactivity Disorder (ADHD)-unstable Ongoing difficulties with time management and work performance, exacerbated by ADHD. Currently on Vyvanse  without significant improvement. Reports sleep issues potentially related to medication regimen. Discussed need for structured support and strategies from a therapist. - Consider increasing Vyvanse  dosage if sleep improves - Continue Vyvanse  20 mg daily for now. - Discuss potential need to discontinue bupropion  if increasing Vyvanse  - Recommend working with a therapist for time management strategies - Schedule follow-up in 2-3 weeks, virtual  Insomnia-unstable Difficulty staying asleep even with zolpidem . Tried melatonin and trazodone  without success. Genetic testing suggests rapid metabolism of zolpidem . Discussed potential benefits of zolpidem  CR and combining with low-dose melatonin. - Switch to zolpidem  CR 6.25 mg - Consider combining low-dose melatonin with zolpidem  CR if needed - Reassess sleep quality in 2-3 weeks  MDD-in remission Currently denies any significant sadness or anhedonia. - Continue Wellbutrin  SR 100 mg daily-reduced dosage. - Continue sertraline  50 mg as prescribed for now.   General Health Maintenance Considering finding a new therapist for more structured support and strategies. Current therapy sessions perceived as venting sessions without actionable strategies. - Discuss with current therapist the need for more structured support - Consider referral to one of the therapists available at the clinic - Schedule follow-up in 2-3 weeks, virtual  Follow-up - Schedule follow-up in 2-3 weeks, virtual - Reassess efficacy of sertraline , Vyvanse , and zolpidem  CR - Monitor for side effects and interactions of current medications.  Collaboration  of Care: Collaboration of Care: Referral or follow-up with counselor/therapist AEB patient encouraged to continue CBT.  Patient/Guardian was advised Release of Information must be obtained prior to any record release in order to collaborate their care with an outside provider. Patient/Guardian was advised if they have not already done  so to contact the registration department to sign all necessary forms in order for us  to release information regarding their care.   Consent: Patient/Guardian gives verbal consent for treatment and assignment of benefits for services provided during this visit. Patient/Guardian expressed understanding and agreed to proceed.  This note was generated in part or whole with voice recognition software. Voice recognition is usually quite accurate but there are transcription errors that can and very often do occur. I apologize for any typographical errors that were not detected and corrected.     Anquan Azzarello, MD 08/28/2023, 9:51 AM

## 2023-08-27 NOTE — Patient Instructions (Signed)
 Insomnia Insomnia is a sleep disorder that makes it difficult to fall asleep or stay asleep. Insomnia can cause fatigue, low energy, difficulty concentrating, mood swings, and poor performance at work or school. There are three different ways to classify insomnia: Difficulty falling asleep. Difficulty staying asleep. Waking up too early in the morning. Any type of insomnia can be long-term (chronic) or short-term (acute). Both are common. Short-term insomnia usually lasts for 3 months or less. Chronic insomnia occurs at least three times a week for longer than 3 months. What are the causes? Insomnia may be caused by another condition, situation, or substance, such as: Having certain mental health conditions, such as anxiety and depression. Using caffeine, alcohol , tobacco, or drugs. Having gastrointestinal conditions, such as gastroesophageal reflux disease (GERD). Having certain medical conditions. These include: Asthma. Alzheimer's disease. Stroke. Chronic pain. An overactive thyroid  gland (hyperthyroidism). Other sleep disorders, such as restless legs syndrome and sleep apnea. Menopause. Sometimes, the cause of insomnia may not be known. What increases the risk? Risk factors for insomnia include: Gender. Females are affected more often than males. Age. Insomnia is more common as people get older. Stress and certain medical and mental health conditions. Lack of exercise. Having an irregular work schedule. This may include working night shifts and traveling between different time zones. What are the signs or symptoms? If you have insomnia, the main symptom is having trouble falling asleep or having trouble staying asleep. This may lead to other symptoms, such as: Feeling tired or having low energy. Feeling nervous about going to sleep. Not feeling rested in the morning. Having trouble concentrating. Feeling irritable, anxious, or depressed. How is this diagnosed? This condition  may be diagnosed based on: Your symptoms and medical history. Your health care provider may ask about: Your sleep habits. Any medical conditions you have. Your mental health. A physical exam. How is this treated? Treatment for insomnia depends on the cause. Treatment may focus on treating an underlying condition that is causing the insomnia. Treatment may also include: Medicines to help you sleep. Counseling or therapy. Lifestyle adjustments to help you sleep better. Follow these instructions at home: Eating and drinking  Limit or avoid alcohol , caffeinated beverages, and products that contain nicotine and tobacco, especially close to bedtime. These can disrupt your sleep. Do not eat a large meal or eat spicy foods right before bedtime. This can lead to digestive discomfort that can make it hard for you to sleep. Sleep habits  Keep a sleep diary to help you and your health care provider figure out what could be causing your insomnia. Write down: When you sleep. When you wake up during the night. How well you sleep and how rested you feel the next day. Any side effects of medicines you are taking. What you eat and drink. Make your bedroom a dark, comfortable place where it is easy to fall asleep. Put up shades or blackout curtains to block light from outside. Use a white noise machine to block noise. Keep the temperature cool. Limit screen use before bedtime. This includes: Not watching TV. Not using your smartphone, tablet, or computer. Stick to a routine that includes going to bed and waking up at the same times every day and night. This can help you fall asleep faster. Consider making a quiet activity, such as reading, part of your nighttime routine. Try to avoid taking naps during the day so that you sleep better at night. Get out of bed if you are still awake after  15 minutes of trying to sleep. Keep the lights down, but try reading or doing a quiet activity. When you feel  sleepy, go back to bed. General instructions Take over-the-counter and prescription medicines only as told by your health care provider. Exercise regularly as told by your health care provider. However, avoid exercising in the hours right before bedtime. Use relaxation techniques to manage stress. Ask your health care provider to suggest some techniques that may work well for you. These may include: Breathing exercises. Routines to release muscle tension. Visualizing peaceful scenes. Make sure that you drive carefully. Do not drive if you feel very sleepy. Keep all follow-up visits. This is important. Contact a health care provider if: You are tired throughout the day. You have trouble in your daily routine due to sleepiness. You continue to have sleep problems, or your sleep problems get worse. Get help right away if: You have thoughts about hurting yourself or someone else. Get help right away if you feel like you may hurt yourself or others, or have thoughts about taking your own life. Go to your nearest emergency room or: Call 911. Call the National Suicide Prevention Lifeline at 2232757840 or 988. This is open 24 hours a day. Text the Crisis Text Line at 657-529-4371. Summary Insomnia is a sleep disorder that makes it difficult to fall asleep or stay asleep. Insomnia can be long-term (chronic) or short-term (acute). Treatment for insomnia depends on the cause. Treatment may focus on treating an underlying condition that is causing the insomnia. Keep a sleep diary to help you and your health care provider figure out what could be causing your insomnia. This information is not intended to replace advice given to you by your health care provider. Make sure you discuss any questions you have with your health care provider. Document Revised: 07/17/2021 Document Reviewed: 07/17/2021 Elsevier Patient Education  2024 [employer].

## 2023-09-02 ENCOUNTER — Other Ambulatory Visit: Payer: Self-pay

## 2023-09-02 ENCOUNTER — Encounter (INDEPENDENT_AMBULATORY_CARE_PROVIDER_SITE_OTHER): Payer: Self-pay | Admitting: Adult Health

## 2023-09-02 ENCOUNTER — Ambulatory Visit (INDEPENDENT_AMBULATORY_CARE_PROVIDER_SITE_OTHER): Payer: 59 | Admitting: Adult Health

## 2023-09-02 VITALS — BP 119/81 | HR 76 | Temp 97.6°F | Ht 70.0 in | Wt 259.0 lb

## 2023-09-02 DIAGNOSIS — E88819 Insulin resistance, unspecified: Secondary | ICD-10-CM | POA: Diagnosis not present

## 2023-09-02 DIAGNOSIS — F419 Anxiety disorder, unspecified: Secondary | ICD-10-CM

## 2023-09-02 DIAGNOSIS — E559 Vitamin D deficiency, unspecified: Secondary | ICD-10-CM

## 2023-09-02 DIAGNOSIS — Z6837 Body mass index (BMI) 37.0-37.9, adult: Secondary | ICD-10-CM | POA: Diagnosis not present

## 2023-09-02 DIAGNOSIS — F32A Depression, unspecified: Secondary | ICD-10-CM

## 2023-09-02 DIAGNOSIS — R632 Polyphagia: Secondary | ICD-10-CM | POA: Diagnosis not present

## 2023-09-02 DIAGNOSIS — R0602 Shortness of breath: Secondary | ICD-10-CM

## 2023-09-02 DIAGNOSIS — E66812 Obesity, class 2: Secondary | ICD-10-CM

## 2023-09-02 DIAGNOSIS — E669 Obesity, unspecified: Secondary | ICD-10-CM

## 2023-09-02 MED ORDER — LOMAIRA 8 MG PO TABS
8.0000 mg | ORAL_TABLET | Freq: Every day | ORAL | 0 refills | Status: DC
  Filled 2023-09-02: qty 30, 30d supply, fill #0

## 2023-09-02 MED ORDER — VITAMIN D (ERGOCALCIFEROL) 1.25 MG (50000 UNIT) PO CAPS
50000.0000 [IU] | ORAL_CAPSULE | ORAL | 0 refills | Status: DC
  Filled 2023-09-02: qty 12, 84d supply, fill #0

## 2023-09-02 MED ORDER — SERTRALINE HCL 50 MG PO TABS
50.0000 mg | ORAL_TABLET | Freq: Every day | ORAL | 0 refills | Status: DC
  Filled 2023-09-02: qty 90, 90d supply, fill #0

## 2023-09-02 MED ORDER — TOPIRAMATE 50 MG PO TABS
50.0000 mg | ORAL_TABLET | Freq: Every day | ORAL | 0 refills | Status: DC
  Filled 2023-09-02: qty 60, 60d supply, fill #0

## 2023-09-02 NOTE — Progress Notes (Addendum)
 WEIGHT SUMMARY AND BIOMETRICS  Vitals Temp: 97.6 F (36.4 C) BP: 119/81 Pulse Rate: 76 SpO2: 99 %   Anthropometric Measurements Height: 5' 10 (1.778 m) Weight: 259 lb (117.5 kg) BMI (Calculated): 37.16 Weight at Last Visit: 262lb Weight Lost Since Last Visit: 3lb Weight Gained Since Last Visit: 0 Starting Weight: 273lb Total Weight Loss (lbs): 14 lb (6.35 kg)   Body Composition  Body Fat %: 42.1 % Fat Mass (lbs): 109 lbs Muscle Mass (lbs): 142.4 lbs Total Body Water (lbs): 96.8 lbs Visceral Fat Rating : 10   Other Clinical Data RMR: 2318 Fasting: yes Labs: yes Today's Visit #: 26 Starting Date: 01/09/18    Chief Complaint:   OBESITY Reese is here to discuss her progress with her obesity treatment plan. She is on the the Category 2 Plan and states she is following her eating plan approximately 75 % of the time.  She states she is not currently exercising.   Interim History:  Her [facility] carries a high deductible and she is unsure what [facility] OV will cost- out of pocket.  She was recently started daily Zoloft , Lomaira , and Topamax  - reports stable mood and appetite levels.  She is considering her best options for weight loss and health promotion with her new high deductible plan.  Subjective:   1. Polyphagia 08/05/2023- Started on Lomaira  and Topamax  to equal Qsymia. She has titrated up to Lomaira  8mg  and Topamax  50mg  daily. She denies increase in anxiety or palpitations.  PDMP reviewed- no aberrancies noted  She has IUD- discussed that Topamax  can reduce birth control effectiveness  Of note- her boyfriend has had a successful vasectomy   2. SOB (shortness of breath) on exertion She endorses dyspnea with extreme exertion, denies CP  09/02/23 08:00  RMR 2318   Metabolism faster than expected  3. Vitamin D  deficiency She is in weekly Ergocalciferol - denies N/V/Muscle Weakness  4. Insulin  resistance Not currently on any  antidiabetic medications as present. She has been on GLP-1 and GIP/GLP- therapy in past, tolerated well with successful weight loss  5. Anxiety and depression She is followed by Psychiatris and therapist. At last OV on 08/05/2023- she was started on Zoloft , currently on 50mg  daily. She was already on: Wellbutrin  SR 100mg  daily, Buspar  15mg  BID (however she usually takes AM dose only)- managed by Psych/Dr. Coby  She denies SI/HI  Assessment/Plan:   1. Polyphagia Check Labs - Vitamin B12 Refill  Phentermine  HCl (LOMAIRA ) 8 MG TABS Take 1 tablet (8 mg total) by mouth daily. Dispense: 30 tablet, Refills: 0 of 0 remaining   topiramate  (TOPAMAX ) 50 MG tablet Take 1 tablet (50 mg total) by mouth daily. Dispense: 60 tablet, Refills: 0 of 0 remaining   2. SOB (shortness of breath) on exertion (Primary) Check IC Modify eating plan- Increase from Cat 2 to Cat 3 due to increased metabolic rate  3. Vitamin D  deficiency Check Labs - VITAMIN D  25 Hydroxy (Vit-D Deficiency, Fractures) Refill  4. Insulin  resistance Check Labs - Comprehensive metabolic panel - Hemoglobin A1c - Insulin , random  5. Anxiety and depression Refill - sertraline  (ZOLOFT ) 50 MG tablet; Take 1 tablet (50 mg total) by mouth daily.  Dispense: 90 tablet; Refill: 0 F/u with Mental Health Care team  6. Obesity, current BMI 37.16  Brizeyda is currently in the action stage of change. As such, her goal is to continue with weight loss efforts. She has agreed to the Category 3 Plan.   Exercise  goals: All adults should avoid inactivity. Some physical activity is better than none, and adults who participate in any amount of physical activity gain some health benefits. Adults should also include muscle-strengthening activities that involve all major muscle groups on 2 or more days a week.  Behavioral modification strategies: increasing lean protein intake, decreasing simple carbohydrates, increasing vegetables, increasing  water intake, decreasing liquid calories, no skipping meals, meal planning and cooking strategies, keeping healthy foods in the home, ways to avoid boredom eating, and planning for success.  Lovella has agreed to follow-up with our clinic in 6 weeks. She was informed of the importance of frequent follow-up visits to maximize her success with intensive lifestyle modifications for her multiple health conditions.   Oney was informed we would discuss her lab results at her next visit unless there is a critical issue that needs to be addressed sooner. Videl agreed to keep her next visit at the agreed upon time to discuss these results.  Objective:   Blood pressure 119/81, pulse 76, temperature 97.6 F (36.4 C), height 5' 10 (1.778 m), weight 259 lb (117.5 kg), SpO2 99%. Body mass index is 37.16 kg/m.  General: Cooperative, alert, well developed, in no acute distress. HEENT: Conjunctivae and lids unremarkable. Cardiovascular: Regular rhythm.  Lungs: Normal work of breathing. Neurologic: No focal deficits.   Lab Results  Component Value Date   CREATININE 0.97 01/24/2023   BUN 11 01/24/2023   NA 139 01/24/2023   K 4.4 01/24/2023   CL 103 01/24/2023   CO2 22 01/24/2023   Lab Results  Component Value Date   ALT 24 01/24/2023   AST 26 01/24/2023   ALKPHOS 61 01/24/2023   BILITOT 0.8 01/24/2023   Lab Results  Component Value Date   HGBA1C 5.0 01/24/2023   HGBA1C 5.0 10/04/2022   HGBA1C 4.9 04/02/2022   HGBA1C 5.0 08/23/2021   HGBA1C 5.0 03/07/2021   Lab Results  Component Value Date   INSULIN  8.8 01/24/2023   INSULIN  10.8 10/04/2022   INSULIN  17.1 04/02/2022   INSULIN  7.0 08/23/2021   INSULIN  20.8 03/07/2021   Lab Results  Component Value Date   TSH 1.680 01/24/2023   Lab Results  Component Value Date   CHOL 143 04/02/2022   HDL 54 04/02/2022   LDLCALC 75 04/02/2022   TRIG 68 04/02/2022   CHOLHDL 2.6 04/02/2022   Lab Results  Component Value Date   VD25OH  45.4 01/24/2023   VD25OH 38.5 10/04/2022   VD25OH 49.5 04/02/2022   Lab Results  Component Value Date   WBC 4.7 01/09/2018   HGB 14.5 01/09/2018   HCT 42.1 01/09/2018   MCV 89 01/09/2018   PLT 195 09/20/2016   No results found for: IRON, TIBC, FERRITIN  Attestation Statements:   Reviewed by clinician on day of visit: allergies, medications, problem list, medical history, surgical history, family history, social history, and previous encounter notes.  I have reviewed the above documentation for accuracy and completeness, and I agree with the above. -  Yocelin Vanlue d. [name] Shutes, NP-C

## 2023-09-03 ENCOUNTER — Encounter (INDEPENDENT_AMBULATORY_CARE_PROVIDER_SITE_OTHER): Payer: Self-pay | Admitting: Adult Health

## 2023-09-04 ENCOUNTER — Encounter (INDEPENDENT_AMBULATORY_CARE_PROVIDER_SITE_OTHER): Payer: Self-pay | Admitting: Adult Health

## 2023-09-04 LAB — COMPREHENSIVE METABOLIC PANEL
ALT: 27 [IU]/L (ref 0–32)
AST: 23 [IU]/L (ref 0–40)
Albumin: 4.7 g/dL (ref 3.9–4.9)
Alkaline Phosphatase: 88 [IU]/L (ref 44–121)
BUN/Creatinine Ratio: 10 (ref 9–23)
BUN: 9 mg/dL (ref 6–20)
Bilirubin Total: 0.5 mg/dL (ref 0.0–1.2)
CO2: 19 mmol/L — ABNORMAL LOW (ref 20–29)
Calcium: 9.4 mg/dL (ref 8.7–10.2)
Chloride: 105 mmol/L (ref 96–106)
Creatinine, Ser: 0.89 mg/dL (ref 0.57–1.00)
Globulin, Total: 2.4 g/dL (ref 1.5–4.5)
Glucose: 90 mg/dL (ref 70–99)
Potassium: 4.4 mmol/L (ref 3.5–5.2)
Sodium: 138 mmol/L (ref 134–144)
Total Protein: 7.1 g/dL (ref 6.0–8.5)
eGFR: 86 mL/min/{1.73_m2} (ref >59)

## 2023-09-04 LAB — VITAMIN B12: Vitamin B-12: 591 pg/mL (ref 232–1245)

## 2023-09-04 LAB — HEMOGLOBIN A1C
Est. average glucose Bld gHb Est-mCnc: 105 mg/dL
Hgb A1c MFr Bld: 5.3 % (ref 4.8–5.6)

## 2023-09-04 LAB — VITAMIN D 25 HYDROXY (VIT D DEFICIENCY, FRACTURES): Vit D, 25-Hydroxy: 35.3 ng/mL (ref 30.0–100.0)

## 2023-09-04 LAB — INSULIN, RANDOM: INSULIN: 11.7 u[IU]/mL (ref 2.6–24.9)

## 2023-09-04 MED FILL — Buspirone HCl Tab 15 MG: ORAL | 90 days supply | Qty: 180 | Fill #1 | Status: AC

## 2023-09-05 ENCOUNTER — Encounter (INDEPENDENT_AMBULATORY_CARE_PROVIDER_SITE_OTHER): Payer: Self-pay | Admitting: Adult Health

## 2023-09-11 ENCOUNTER — Other Ambulatory Visit: Payer: Self-pay | Admitting: Psychiatry

## 2023-09-11 ENCOUNTER — Other Ambulatory Visit: Payer: Self-pay

## 2023-09-11 DIAGNOSIS — F900 Attention-deficit hyperactivity disorder, predominantly inattentive type: Secondary | ICD-10-CM

## 2023-09-11 MED ORDER — LISDEXAMFETAMINE DIMESYLATE 20 MG PO CAPS
20.0000 mg | ORAL_CAPSULE | Freq: Every morning | ORAL | 0 refills | Status: DC
  Filled 2023-09-11: qty 30, 30d supply, fill #0

## 2023-09-12 ENCOUNTER — Other Ambulatory Visit: Payer: Self-pay

## 2023-09-12 DIAGNOSIS — F3342 Major depressive disorder, recurrent, in full remission: Secondary | ICD-10-CM

## 2023-09-12 DIAGNOSIS — F411 Generalized anxiety disorder: Secondary | ICD-10-CM

## 2023-09-12 DIAGNOSIS — F900 Attention-deficit hyperactivity disorder, predominantly inattentive type: Secondary | ICD-10-CM

## 2023-09-13 ENCOUNTER — Other Ambulatory Visit: Payer: Self-pay

## 2023-09-13 MED ORDER — AMPHETAMINE-DEXTROAMPHETAMINE 10 MG PO TABS
10.0000 mg | ORAL_TABLET | Freq: Two times a day (BID) | ORAL | 0 refills | Status: DC
  Filled 2023-09-13: qty 60, 30d supply, fill #0

## 2023-09-13 NOTE — Telephone Encounter (Signed)
Contacted patient by phone, patient unable to afford Vyvanse due to high co-pay due to having a high deductible. Patient interested in trial of Adderall immediate release. Start Adderall 10 mg twice daily.  Provided medication education.  I have sent medication to [facility] pharmacy per her request.

## 2023-09-24 ENCOUNTER — Other Ambulatory Visit: Payer: Self-pay

## 2023-09-24 ENCOUNTER — Telehealth: Payer: 59 | Admitting: Physician Assistant

## 2023-09-24 DIAGNOSIS — L2084 Intrinsic (allergic) eczema: Secondary | ICD-10-CM

## 2023-09-24 MED ORDER — TRIAMCINOLONE ACETONIDE 0.1 % EX CREA
1.0000 | TOPICAL_CREAM | Freq: Two times a day (BID) | CUTANEOUS | 0 refills | Status: DC
  Filled 2023-09-24: qty 30, 15d supply, fill #0

## 2023-09-24 NOTE — Progress Notes (Signed)
 I have spent 5 minutes in review of e-visit questionnaire, review and updating patient chart, medical decision making and response to patient.   Piedad Climes, PA-C

## 2023-09-24 NOTE — Progress Notes (Signed)

## 2023-09-30 ENCOUNTER — Ambulatory Visit (INDEPENDENT_AMBULATORY_CARE_PROVIDER_SITE_OTHER): Payer: 59 | Admitting: Family Medicine

## 2023-10-01 ENCOUNTER — Telehealth (INDEPENDENT_AMBULATORY_CARE_PROVIDER_SITE_OTHER): Payer: 59 | Admitting: Psychiatry

## 2023-10-01 ENCOUNTER — Encounter: Payer: Self-pay | Admitting: Psychiatry

## 2023-10-01 ENCOUNTER — Other Ambulatory Visit: Payer: Self-pay

## 2023-10-01 DIAGNOSIS — F3342 Major depressive disorder, recurrent, in full remission: Secondary | ICD-10-CM

## 2023-10-01 DIAGNOSIS — F411 Generalized anxiety disorder: Secondary | ICD-10-CM

## 2023-10-01 DIAGNOSIS — F900 Attention-deficit hyperactivity disorder, predominantly inattentive type: Secondary | ICD-10-CM | POA: Diagnosis not present

## 2023-10-01 DIAGNOSIS — F5105 Insomnia due to other mental disorder: Secondary | ICD-10-CM | POA: Diagnosis not present

## 2023-10-01 MED ORDER — AMPHETAMINE-DEXTROAMPHETAMINE 10 MG PO TABS
10.0000 mg | ORAL_TABLET | Freq: Two times a day (BID) | ORAL | 0 refills | Status: DC
  Filled 2023-11-06: qty 60, 30d supply, fill #0

## 2023-10-01 MED ORDER — BUSPIRONE HCL 15 MG PO TABS
15.0000 mg | ORAL_TABLET | Freq: Two times a day (BID) | ORAL | 1 refills | Status: DC
  Filled 2023-10-01: qty 180, 90d supply, fill #0

## 2023-10-01 MED ORDER — BUPROPION HCL 75 MG PO TABS
75.0000 mg | ORAL_TABLET | ORAL | 0 refills | Status: DC
  Filled 2023-10-01: qty 18, 18d supply, fill #0

## 2023-10-01 MED ORDER — ZOLPIDEM TARTRATE ER 6.25 MG PO TBCR
6.2500 mg | EXTENDED_RELEASE_TABLET | Freq: Every evening | ORAL | 2 refills | Status: DC | PRN
  Filled 2023-10-01 – 2023-10-12 (×2): qty 30, 30d supply, fill #0
  Filled 2023-11-13: qty 30, 30d supply, fill #1
  Filled 2023-12-20: qty 30, 30d supply, fill #2

## 2023-10-01 NOTE — Progress Notes (Unsigned)
Virtual Visit via Video Note  I connected with Rhonda [name] on 10/01/23 at 11:00 AM EST by a video enabled telemedicine application and verified that I am speaking with the correct person using two identifiers.  Location Provider Location : [facility] Patient Location : Work  Participants: Patient , Provider    I discussed the limitations of evaluation and management by telemedicine and the availability of in person appointments. The patient expressed understanding and agreed to proceed.   I discussed the assessment and treatment plan with the patient. The patient was provided an opportunity to ask questions and all were answered. The patient agreed with the plan and demonstrated an understanding of the instructions.   The patient was advised to call back or seek an in-person evaluation if the symptoms worsen or if the condition fails to improve as anticipated.    [facility] MD OP Progress Note  10/02/2023 2:10 PM Rhonda [name]  MRN:  [id number]  Chief Complaint:  Chief Complaint  Patient presents with   Follow-up   Anxiety   ADD   Medication Refill   HPI: Rhonda [name] is a 38 year old Caucasian female, divorced, employed, lives in [place], has a history of MDD, GAD, insomnia, ADHD was evaluated by telemedicine today.  The patient presents for a follow-up regarding medication management for anxiety and attention issues.  She is currently taking Adderall 10 mg twice a day, which she started approximately 10 to 14 days ago. It significantly helps her focus without any noticeable side effects. She takes the medication in the morning and after lunch. Previously, she used Vyvanse but switched due to cost.  She experiences anxiety, which she attributes to her life circumstances, particularly co-parenting. She is on sertraline 50 mg and Buspar 15 mg twice a day. She is attempting to manage her anxiety with behavioral changes in addition to medication, noting a slight  improvement.  She has been on Wellbutrin 100 mg since her early twenties for depression. She acknowledges being on multiple medications and is open to reducing them.  She takes Ambien extended release 6.25 mg, which effectively prevents her from waking up in the middle of the night.  In terms of her social situation, she is co-parenting and faces financial constraints affecting her ability to attend therapy. Her son also attends therapy, and she prioritizes his sessions over her own due to financial reasons. She is on a high deductible insurance plan, requiring her to pay out of pocket until the deductible is met. Denies self-injurious thoughts or thoughts of harming herself.  Visit Diagnosis:    ICD-10-CM   1. Recurrent major depressive disorder, in full remission (HCC)  F33.42 buPROPion (WELLBUTRIN) 75 MG tablet    2. GAD (generalized anxiety disorder)  F41.1 busPIRone (BUSPAR) 15 MG tablet    3. Insomnia due to mental condition  F51.05 zolpidem (AMBIEN CR) 6.25 MG CR tablet   anxiety, depression    4. Attention deficit hyperactivity disorder (ADHD), predominantly inattentive type  F90.0 amphetamine-dextroamphetamine (ADDERALL) 10 MG tablet      Past Psychiatric History: I have reviewed past psychiatric history from progress note on 11/05/2018.  Past Medical History:  Past Medical History:  Diagnosis Date   Depression    treated   PCOS (polycystic ovarian syndrome) 2008   Plantar fasciitis     Past Surgical History:  Procedure Laterality Date   CHOLECYSTECTOMY      Family Psychiatric History: I have reviewed family psychiatric history from progress note on 11/05/2018.  Family History:  Family History  Problem Relation Age of Onset   High blood pressure Mother    Depression Father    Liver disease Father    Alcoholism Father    Alcohol abuse Father    Diabetes Paternal Grandmother    Bipolar disorder Maternal Aunt     Social History: I have reviewed social history  from progress note on 11/05/2018. Social History   Socioeconomic History   Marital status: Divorced    Spouse name: Christiane Ha   Number of children: 2   Years of education: Not on file   Highest education level: Bachelor's degree (e.g., BA, AB, BS)  Occupational History   Occupation: RN  Tobacco Use   Smoking status: Never   Smokeless tobacco: Never  Vaping Use   Vaping status: Never Used  Substance and Sexual Activity   Alcohol use: Yes    Alcohol/week: 1.0 standard drink of alcohol    Types: 1 Glasses of wine per week    Comment: Occasional once a week.   Drug use: No   Sexual activity: Yes    Partners: Male    Birth control/protection: None, I.U.D.  Other Topics Concern   Not on file  Social History Narrative   Not on file   Social Drivers of Health   Financial Resource Strain: Low Risk  (06/19/2023)   [facility]   Overall Financial Resource Strain (CARDIA)    Difficulty of Paying Living Expenses: Not very hard  Food Insecurity: No Food Insecurity (06/19/2023)   Received from [facility]   Hunger Vital Sign    Worried About Running Out of Food in the Last Year: Never true    Ran Out of Food in the Last Year: Never true  Transportation Needs: No Transportation Needs (06/19/2023)   Received from [facility] - Transportation    In the past 12 months, has lack of transportation kept you from medical appointments or from getting medications?: No    Lack of Transportation (Non-Medical): No  Physical Activity: Inactive (11/05/2018)   Exercise Vital Sign    Days of Exercise per Week: 0 days    Minutes of Exercise per Session: 0 min  Stress: Stress Concern Present (11/05/2018)   [employer] of Occupational Health - Occupational Stress Questionnaire    Feeling of Stress : Rather much  Social Connections: Unknown (11/05/2018)   Social Connection and Isolation Panel [NHANES]    Frequency of  Communication with Friends and Family: Not on file    Frequency of Social Gatherings with Friends and Family: Not on file    Attends Religious Services: More than 4 times per year    Active Member of Clubs or Organizations: No    Attends Banker Meetings: Never    Marital Status: Married    Allergies: No Known Allergies  Metabolic Disorder Labs: Lab Results  Component Value Date   HGBA1C 5.3 09/02/2023   No results found for: "PROLACTIN" Lab Results  Component Value Date   CHOL 143 04/02/2022   TRIG 68 04/02/2022   HDL 54 04/02/2022   CHOLHDL 2.6 04/02/2022   LDLCALC 75 04/02/2022   LDLCALC 76 03/07/2021   Lab Results  Component Value Date   TSH 1.680 01/24/2023   TSH 2.280 03/07/2021    Therapeutic Level Labs: No results found for: "LITHIUM" No results found for: "VALPROATE" No results found for: "CBMZ"  Current Medications: Current Outpatient Medications  Medication Sig Dispense Refill   buPROPion (WELLBUTRIN) 75 MG tablet Take 1 tablet (75 mg total) by mouth as directed for 18 days. Take 1 tablet daily for 15 days and then take 1 tablet every other day for 3 doses and stop taking 18 tablet 0   [START ON 10/14/2023] amphetamine-dextroamphetamine (ADDERALL) 10 MG tablet Take 1 tablet (10 mg total) by mouth 2 (two) times daily. 60 tablet 0   buPROPion ER (WELLBUTRIN SR) 100 MG 12 hr tablet Take 1 tablet (100 mg total) by mouth in the morning. 90 tablet 1   busPIRone (BUSPAR) 15 MG tablet Take 1 tablet (15 mg total) by mouth 2 (two) times daily. 180 tablet 1   ELDERBERRY PO Take by mouth.     hydrOXYzine (VISTARIL) 25 MG capsule Take 1-2 capsules (25-50 mg total) by mouth at bedtime as needed for sleep 180 capsule 0   levocetirizine (XYZAL) 5 MG tablet Take 5 mg by mouth every evening.     levonorgestrel (MIRENA) 20 MCG/24HR IUD 1 each by Intrauterine route once.     Multiple Vitamin (MULTIVITAMIN) tablet Take 1 tablet by mouth daily.     nystatin cream  (MYCOSTATIN) Apply 1 Application topically 2 (two) times daily. 60 g 0   Phentermine HCl (LOMAIRA) 8 MG TABS Take 1 tablet (8 mg total) by mouth daily. 30 tablet 0   Probiotic Product (PROBIOTIC-10 PO) Take by mouth.     propranolol (INDERAL) 20 MG tablet Take 1 tablet (20 mg total) by mouth 2 (two) times daily as needed 60 tablet 1   sertraline (ZOLOFT) 50 MG tablet Take 1 tablet (50 mg total) by mouth daily. 90 tablet 0   spironolactone (ALDACTONE) 100 MG tablet Take 1 tablet (100 mg total) by mouth daily. 90 tablet 3   topiramate (TOPAMAX) 50 MG tablet Take 1 tablet (50 mg total) by mouth daily. 60 tablet 0   triamcinolone cream (KENALOG) 0.1 % Apply 1 Application topically 2 (two) times daily. 30 g 0   Vitamin D, Ergocalciferol, (DRISDOL) 1.25 MG (50000 UNIT) CAPS capsule Take 1 capsule (50,000 Units total) by mouth every 7 (seven) days. 12 capsule 0   zolpidem (AMBIEN CR) 6.25 MG CR tablet Take 1 tablet (6.25 mg total) by mouth at bedtime as needed for sleep. 30 tablet 2   No current facility-administered medications for this visit.     Musculoskeletal: Strength & Muscle Tone:  UTA Gait & Station:  Seated Patient leans: N/A  Psychiatric Specialty Exam: Review of Systems  Psychiatric/Behavioral:  Positive for decreased concentration. The patient is nervous/anxious.     There were no vitals taken for this visit.There is no height or weight on file to calculate BMI.  General Appearance: Casual  Eye Contact:  Good  Speech:  Clear and Coherent  Volume:  Normal  Mood:  Anxious  Affect:  Congruent  Thought Process:  Goal Directed and Descriptions of Associations: Intact  Orientation:  Full (Time, Place, and Person)  Thought Content: Logical   Suicidal Thoughts:  No  Homicidal Thoughts:  No  Memory:  Immediate;   Fair Recent;   Fair Remote;   Fair  Judgement:  Fair  Insight:  Fair  Psychomotor Activity:  Normal  Concentration:  Concentration: Fair and Attention Span: Fair   Recall:  [employer] of Knowledge: Fair  Language: Fair  Akathisia:  No  Handed:  Right  AIMS (if indicated): not done  Assets:  Desire for Improvement Housing Social Support  Transportation  ADL's:  Intact  Cognition: WNL  Sleep:  Fair   Screenings: GAD-7    Garment/textile technologist Visit from 08/27/2023 in [facility] Office Visit from 03/18/2023 in [facility] Office Visit from 01/23/2023 in [facility] Video Visit from 12/07/2020 in [facility]  Total GAD-7 Score 5 4 12 1       PHQ2-9    Flowsheet Row Office Visit from 08/27/2023 in [facility] Office Visit from 03/18/2023 in [facility] Office Visit from 01/23/2023 in [facility] Video Visit from 08/06/2022 in [facility] Office Visit from 01/04/2022 in [facility]  PHQ-2 Total Score 1 1 3  0 2  PHQ-9 Total Score -- 8 14 -- 10      Flowsheet Row Video Visit from 10/01/2023 in [facility] Office Visit from 08/27/2023 in [facility] Video Visit from 06/18/2023 in [facility]  C-SSRS RISK CATEGORY No Risk No Risk No Risk        Assessment and Plan: Rhonda [name] is a 38 year old Caucasian female, divorced, employed, has a history of depression, anxiety, ADHD was evaluated by telemedicine today.  Patient with ongoing anxiety, ADHD symptoms, with improvement with recent medication changes, discussed assessment and plan as noted below.  Attention-Deficit/Hyperactivity Disorder (ADHD)-improving Adderall 10 mg twice daily is effective in improving focus without  side effects. Previously used Vyvanse but found it too expensive. Plan to taper off Wellbutrin to reduce medication load and avoid potential over-activation and anxiety due to overlapping mechanisms with Adderall. - Continue Adderall 10 mg twice daily - Taper Wellbutrin: prescribe 75 mg tablets, take 75 mg daily for 2 weeks, then every other day for 3-4 doses, then discontinue - Follow up in 3-4 weeks to reassess and consider increasing Adderall dosage if needed  Depression in remission Managed with Wellbutrin, sertraline, and Buspar. Plan to taper Wellbutrin due to overlapping mechanism with Adderall and potential for over-activation. - Taper Wellbutrin as outlined under ADHD - Continue Sertraline and BuSpar as prescribed.  Generalized Anxiety Disorder (GAD)-unstable Ongoing anxiety managed with Buspar 15 mg twice daily and sertraline 50 mg daily. Advised against making multiple medication changes at once, focusing on tapering Wellbutrin first. - Continue Buspar 15 mg twice daily - Continue Sertraline 50 mg daily - Consider adjusting Buspar if Sertraline dosage is increased in the future - Provided information for therapist in the community.  Patient encouraged to establish care. - Explore [facility] for affordable therapy options  Insomnia-improving Improved sleep with Ambien CR. - Continue Ambien CR 6.25 mg - Reviewed [place] PMP AWARxE    Follow-up - Schedule follow-up appointment on March 5th at 9:20 AM - Monitor blood pressure and overall health if Adderall dosage is increased.   Collaboration of Care: Collaboration of Care: Referral or follow-up with counselor/therapist AEB encouraged to establish care with therapist.  Patient/Guardian was advised Release of Information must be obtained prior to any record release in order to collaborate their care with an outside provider. Patient/Guardian was advised if they have not already done so to contact the registration department  to sign all necessary forms in order for [name] to release information regarding their care.   Consent: Patient/Guardian gives verbal consent for treatment and assignment  of benefits for services provided during this visit. Patient/Guardian expressed understanding and agreed to proceed.   This note was generated in part or whole with voice recognition software. Voice recognition is usually quite accurate but there are transcription errors that can and very often do occur. I apologize for any typographical errors that were not detected and corrected.    Jomarie Longs, MD 10/02/2023, 2:10 PM

## 2023-10-07 ENCOUNTER — Ambulatory Visit (INDEPENDENT_AMBULATORY_CARE_PROVIDER_SITE_OTHER): Payer: 59 | Admitting: Adult Health

## 2023-10-07 ENCOUNTER — Encounter (INDEPENDENT_AMBULATORY_CARE_PROVIDER_SITE_OTHER): Payer: Self-pay | Admitting: Adult Health

## 2023-10-07 ENCOUNTER — Other Ambulatory Visit: Payer: Self-pay

## 2023-10-07 VITALS — BP 126/83 | HR 74 | Temp 98.9°F | Ht 70.0 in | Wt 258.0 lb

## 2023-10-07 DIAGNOSIS — F419 Anxiety disorder, unspecified: Secondary | ICD-10-CM

## 2023-10-07 DIAGNOSIS — E88819 Insulin resistance, unspecified: Secondary | ICD-10-CM

## 2023-10-07 DIAGNOSIS — E559 Vitamin D deficiency, unspecified: Secondary | ICD-10-CM

## 2023-10-07 DIAGNOSIS — E669 Obesity, unspecified: Secondary | ICD-10-CM

## 2023-10-07 DIAGNOSIS — Z6836 Body mass index (BMI) 36.0-36.9, adult: Secondary | ICD-10-CM | POA: Diagnosis not present

## 2023-10-07 DIAGNOSIS — Z0000 Encounter for general adult medical examination without abnormal findings: Secondary | ICD-10-CM

## 2023-10-07 DIAGNOSIS — F32A Depression, unspecified: Secondary | ICD-10-CM | POA: Diagnosis not present

## 2023-10-07 DIAGNOSIS — E66812 Obesity, class 2: Secondary | ICD-10-CM

## 2023-10-07 MED ORDER — LOMAIRA 8 MG PO TABS
8.0000 mg | ORAL_TABLET | Freq: Every day | ORAL | 0 refills | Status: DC
  Filled 2023-10-07: qty 30, 30d supply, fill #0

## 2023-10-07 MED ORDER — TOPIRAMATE 50 MG PO TABS
50.0000 mg | ORAL_TABLET | Freq: Every day | ORAL | 0 refills | Status: DC
  Filled 2023-10-07 – 2023-10-10 (×2): qty 60, 60d supply, fill #0

## 2023-10-07 MED ORDER — VITAMIN D (ERGOCALCIFEROL) 1.25 MG (50000 UNIT) PO CAPS
50000.0000 [IU] | ORAL_CAPSULE | ORAL | 0 refills | Status: DC
  Filled 2023-10-07 – 2023-10-10 (×2): qty 12, 84d supply, fill #0

## 2023-10-07 MED ORDER — SERTRALINE HCL 50 MG PO TABS
50.0000 mg | ORAL_TABLET | Freq: Every day | ORAL | 0 refills | Status: DC
  Filled 2023-10-07 – 2023-10-10 (×2): qty 90, 90d supply, fill #0

## 2023-10-07 NOTE — Progress Notes (Signed)
WEIGHT SUMMARY AND BIOMETRICS  Vitals Temp: 98.9 F (37.2 C) BP: 126/83 Pulse Rate: 74 SpO2: 99 %   Anthropometric Measurements Height: 5\' 10"  (1.778 m) Weight: 258 lb (117 kg) BMI (Calculated): 37.02 Weight at Last Visit: 259 lb Weight Lost Since Last Visit: 1 lb Weight Gained Since Last Visit: 0 Starting Weight: 273 lb Total Weight Loss (lbs): 15 lb (6.804 kg)   Body Composition  Body Fat %: 42.4 % Fat Mass (lbs): 109.8 lbs Muscle Mass (lbs): 141.6 lbs Total Body Water (lbs): 96.2 lbs Visceral Fat Rating : 10   Other Clinical Data RMR: [id number] Fasting: no Labs: no Today's Visit #: 61 Starting Date: 01/09/18    Chief Complaint:   OBESITY Rhonda [name] is here to discuss her progress with her obesity treatment plan. She is on the the Category 3 Plan and states she is following her eating plan approximately 75 % of the time.  She states she is exercising: None   Interim History:  08/05/2023 [address] 262 Started on Bluejacket and Topamax She followed titration schedule and is currently on daily Lomaira 8mg  and Topamax 50mg  She estimates to have been following her prescribed meal plan at least 75% and still trying to find time to exercise.  Reviewed her schedule when she has her two children and when she does not. Reviewed her FT and PT work schedule. Identified times when home exercise could be accomplished.  Subjective:   1. Healthcare maintenance Discussed Labs 09/02/2023 CMP: Electrolytes, Kidney/Liver enzymes - normal PDMP reviewed- no aberrancies noted She is on daily Lomaria 8mg   2. Insulin resistance Discussed Labs  Latest Reference Range & Units 09/02/23 09:13  Glucose 70 - 99 mg/dL 90  Hemoglobin A6T 4.8 - 5.6 % 5.3  Est. average glucose Bld gHb Est-mCnc mg/dL 016  INSULIN 2.6 - 01.0 uIU/mL 11.7   CBG and A1c at goal Insulin level worsened to 11.7.  Highest Insulin level  Latest Reference Range & Units 07/19/20 08:30  INSULIN 2.6 - 24.9  uIU/mL 34.0 (H)  (H): Data is abnormally high  Lowest Insulin level   Latest Reference Range & Units 08/23/21 08:53  INSULIN 2.6 - 24.9 uIU/mL 7.0   She is no currently on any blood glucose lowering medications  3. Vit D Def Discussed Labs  Latest Reference Range & Units 09/02/23 09:13  Vitamin D, 25-Hydroxy 30.0 - 100.0 ng/mL 35.3   Vit D Level- low normal Well below goal of 50-70 She is on weekly Ergocalciferol- denies N/V/Muscle Weakness  3. Anxiety and depression Her psychaitrist is weaning her off Wellbutrin SR, she was decreased from 100mg  every day to 75mg  every day  She recently converted from Vyvanse to Adderall due to cost of Vyvanse HWW is managing daily Zoloft 50mg  She reports stable mood, denies N/V/Muscle Weakness  Assessment/Plan:   1. Healthcare maintenance Check Labs - Lipid panel -TSH and Free T4  2. Insulin resistance Continue to eat on plan and start a regular exercise regime. Utilize YouTube for guided home exercise-strive for at least once/week  3. Vit  Def Refill Vitamin D, Ergocalciferol, (DRISDOL) 1.25 MG (50000 UNIT) CAPS capsule Take 1 capsule (50,000 Units total) by mouth every 7 (seven) days. Dispense: 12 capsule, Refills: 0 of 0 remaining   4. Anxiety and depression Refill - sertraline (ZOLOFT) 50 MG tablet; Take 1 tablet (50 mg total) by mouth daily.  Dispense: 90 tablet; Refill: 0  5. Obesity, current BMI 36.45 (Primary) Refill Phentermine HCl (LOMAIRA) 8  MG TABS Take 1 tablet (8 mg total) by mouth daily. Dispense: 30 tablet, Refills: 0 of 0 remaining   []   topiramate (TOPAMAX) 50 MG tablet Take 1 tablet (50 mg total) by mouth daily. Dispense: 60 tablet, Refills: 0 of 0 remaining   Rhonda [name] is currently in the action stage of change. As such, her goal is to continue with weight loss efforts. She has agreed to the Category 3 Plan.   Exercise goals: All adults should avoid inactivity. Some physical activity is better than none, and  adults who participate in any amount of physical activity gain some health benefits. Adults should also include muscle-strengthening activities that involve all major muscle groups on 2 or more days a week.  Behavioral modification strategies: increasing lean protein intake, decreasing simple carbohydrates, increasing vegetables, increasing water intake, no skipping meals, meal planning and cooking strategies, keeping healthy foods in the home, and planning for success.  Rhonda [name] has agreed to follow-up with our clinic in 4 weeks. She was informed of the importance of frequent follow-up visits to maximize her success with intensive lifestyle modifications for her multiple health conditions.   Rhonda [name] was informed we would discuss her lab results at her next visit unless there is a critical issue that needs to be addressed sooner. Rhonda [name] agreed to keep her next visit at the agreed upon time to discuss these results.  Objective:   Blood pressure 126/83, pulse 74, temperature 98.9 F (37.2 C), height 5\' 10"  (1.778 m), weight 258 lb (117 kg), SpO2 99%. Body mass index is 37.02 kg/m.  General: Cooperative, alert, well developed, in no acute distress. HEENT: Conjunctivae and lids unremarkable. Cardiovascular: Regular rhythm.  Lungs: Normal work of breathing. Neurologic: No focal deficits.   Lab Results  Component Value Date   CREATININE 0.89 09/02/2023   BUN 9 09/02/2023   NA 138 09/02/2023   K 4.4 09/02/2023   CL 105 09/02/2023   CO2 19 (L) 09/02/2023   Lab Results  Component Value Date   ALT 27 09/02/2023   AST 23 09/02/2023   ALKPHOS 88 09/02/2023   BILITOT 0.5 09/02/2023   Lab Results  Component Value Date   HGBA1C 5.3 09/02/2023   HGBA1C 5.0 01/24/2023   HGBA1C 5.0 10/04/2022   HGBA1C 4.9 04/02/2022   HGBA1C 5.0 08/23/2021   Lab Results  Component Value Date   INSULIN 11.7 09/02/2023   INSULIN 8.8 01/24/2023   INSULIN 10.8 10/04/2022   INSULIN 17.1 04/02/2022    INSULIN 7.0 08/23/2021   Lab Results  Component Value Date   TSH 1.680 01/24/2023   Lab Results  Component Value Date   CHOL 143 04/02/2022   HDL 54 04/02/2022   LDLCALC 75 04/02/2022   TRIG 68 04/02/2022   CHOLHDL 2.6 04/02/2022   Lab Results  Component Value Date   VD25OH 35.3 09/02/2023   VD25OH 45.4 01/24/2023   VD25OH 38.5 10/04/2022   Lab Results  Component Value Date   WBC 4.7 01/09/2018   HGB 14.5 01/09/2018   HCT 42.1 01/09/2018   MCV 89 01/09/2018   PLT 195 09/20/2016   No results found for: "IRON", "TIBC", "FERRITIN"  Attestation Statements:   Reviewed by clinician on day of visit: allergies, medications, problem list, medical history, surgical history, family history, social history, and previous encounter notes.  I have reviewed the above documentation for accuracy and completeness, and I agree with the above. -  Katrianna Friesenhahn d. Alysabeth Scalia, NP-C

## 2023-10-08 ENCOUNTER — Encounter (INDEPENDENT_AMBULATORY_CARE_PROVIDER_SITE_OTHER): Payer: Self-pay | Admitting: Adult Health

## 2023-10-08 LAB — LIPID PANEL
Chol/HDL Ratio: 4.2 {ratio} (ref 0.0–4.4)
Cholesterol, Total: 174 mg/dL (ref 100–199)
HDL: 41 mg/dL (ref >39)
LDL Chol Calc (NIH): 113 mg/dL — ABNORMAL HIGH (ref 0–99)
Triglycerides: 112 mg/dL (ref 0–149)
VLDL Cholesterol Cal: 20 mg/dL (ref 5–40)

## 2023-10-08 LAB — TSH+FREE T4
Free T4: 1.07 ng/dL (ref 0.82–1.77)
TSH: 2.17 u[IU]/mL (ref 0.450–4.500)

## 2023-10-10 ENCOUNTER — Other Ambulatory Visit: Payer: Self-pay

## 2023-10-11 ENCOUNTER — Other Ambulatory Visit: Payer: Self-pay

## 2023-10-12 ENCOUNTER — Other Ambulatory Visit: Payer: Self-pay

## 2023-10-13 ENCOUNTER — Other Ambulatory Visit: Payer: Self-pay

## 2023-10-18 ENCOUNTER — Telehealth: Payer: 59 | Admitting: Physician Assistant

## 2023-10-18 ENCOUNTER — Other Ambulatory Visit: Payer: Self-pay

## 2023-10-18 DIAGNOSIS — B3731 Acute candidiasis of vulva and vagina: Secondary | ICD-10-CM

## 2023-10-18 MED ORDER — FLUCONAZOLE 150 MG PO TABS
150.0000 mg | ORAL_TABLET | ORAL | 0 refills | Status: DC | PRN
  Filled 2023-10-18: qty 2, 6d supply, fill #0

## 2023-10-18 NOTE — Progress Notes (Signed)

## 2023-10-21 NOTE — Progress Notes (Unsigned)
 BH MD OP Progress Note  10/23/2023 9:42 AM Rhonda [name]  MRN:  [id number]  Chief Complaint:  Chief Complaint  Patient presents with   Follow-up   Depression   Anxiety   Medication Refill   ADD   HPI: Rhonda [name] is a 38 year old Caucasian female, divorced, employed, lives in [place], has a history of MDD, GAD, insomnia, ADHD was evaluated in office today.  The patient is currently tapering off Wellbutrin, taking it every other day, with three doses remaining. There are no significant changes since reducing the dose, and she feels okay overall. She has a history of depression, which is currently in remission.  Anxiety remains somewhat controlled. She is taking Buspar 15 mg twice daily and Zoloft 50 mg daily. She has not pursued therapy due to financial constraints but is considering more affordable options. She feels less anxious about her situation with her ex-partner, possibly due to medication or personal growth.  For ADHD, she is on Adderall 10 mg twice daily and prefers it over Vyvanse, as it helps her concentrate better. She is satisfied with the current dosage and does not wish to increase it at this time.  She reports sleep disturbances, waking up around 3 or 4 AM several times a week despite taking Ambien 6.25 mg. She has been alternating Ambien with hydroxyzine 25 mg to avoid dependency, but hydroxyzine causes next-day drowsiness. She agrees to adding melatonin to her regimen to improve sleep quality.  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 She has her children one week on and one week off, which impacts her ability to find time for self-care and exercise. She is working with a healthy weight and wellness provider to start exercising once a week.   Visit Diagnosis:    ICD-10-CM   1. Recurrent major depressive disorder, in full remission (HCC)  F33.42      2. GAD (generalized anxiety disorder)  F41.1     3. Insomnia due to mental condition  F51.05    Anxiety, depression    4. Attention deficit hyperactivity disorder (ADHD), predominantly inattentive type  F90.0       Past Psychiatric History: I have reviewed past psychiatric history from progress note on 11/05/2018.  Past Medical History:  Past Medical History:  Diagnosis Date   Depression    treated   PCOS (polycystic ovarian syndrome) 2008   Plantar fasciitis     Past Surgical History:  Procedure Laterality Date   CHOLECYSTECTOMY      Family Psychiatric History: I have reviewed family psychiatric history from progress note on 11/05/2018.  Family History:  Family History  Problem Relation Age of Onset   High blood pressure Mother    Depression Father    Liver disease Father    Alcoholism Father    Alcohol abuse Father    Diabetes Paternal Grandmother    Bipolar disorder Maternal Aunt     Social History: I have reviewed social history from progress note on 11/05/2018. Social History   Socioeconomic History   Marital status: Divorced    Spouse name: Christiane Ha   Number of children: 2   Years of education: Not on file   Highest education level: Bachelor's degree (e.g., BA, AB, BS)  Occupational History   Occupation: RN  Tobacco Use   Smoking status: Never   Smokeless tobacco: Never  Vaping Use   Vaping status:  Never Used  Substance and Sexual Activity   Alcohol use: Yes    Alcohol/week: 1.0 standard drink of alcohol    Types: 1 Glasses of wine per week    Comment: Occasional once a week.   Drug use: No   Sexual activity: Yes    Partners: Male    Birth control/protection: None, I.U.D.  Other Topics Concern   Not on file  Social History Narrative   Not on file   Social Drivers of Health   Financial Resource Strain: Low Risk  (06/19/2023)   Received from [facility]   Overall Financial Resource Strain (CARDIA)    Difficulty of Paying  Living Expenses: Not very hard  Food Insecurity: No Food Insecurity (06/19/2023)   Received from [facility]   Hunger Vital Sign    Worried About Running Out of Food in the Last Year: Never true    Ran Out of Food in the Last Year: Never true  Transportation Needs: No Transportation Needs (06/19/2023)   Received from [facility] - Transportation    In the past 12 months, has lack of transportation kept you from medical appointments or from getting medications?: No    Lack of Transportation (Non-Medical): No  Physical Activity: Inactive (11/05/2018)   Exercise Vital Sign    Days of Exercise per Week: 0 days    Minutes of Exercise per Session: 0 min  Stress: Stress Concern Present (11/05/2018)   [employer] of Occupational Health - Occupational Stress Questionnaire    Feeling of Stress : Rather much  Social Connections: Unknown (11/05/2018)   Social Connection and Isolation Panel [NHANES]    Frequency of Communication with Friends and Family: Not on file    Frequency of Social Gatherings with Friends and Family: Not on file    Attends Religious Services: More than 4 times per year    Active Member of Clubs or Organizations: No    Attends Banker Meetings: Never    Marital Status: Married    Allergies: No Known Allergies  Metabolic Disorder Labs: Lab Results  Component Value Date   HGBA1C 5.3 09/02/2023   No results found for: "PROLACTIN" Lab Results  Component Value Date   CHOL 174 10/07/2023   TRIG 112 10/07/2023   HDL 41 10/07/2023   CHOLHDL 4.2 10/07/2023   LDLCALC 113 (H) 10/07/2023   LDLCALC 75 04/02/2022   Lab Results  Component Value Date   TSH 2.170 10/07/2023   TSH 1.680 01/24/2023    Therapeutic Level Labs: No results found for: "LITHIUM" No results found for: "VALPROATE" No results found for: "CBMZ"  Current Medications: Current Outpatient Medications  Medication Sig Dispense Refill    amphetamine-dextroamphetamine (ADDERALL) 10 MG tablet Take 1 tablet (10 mg total) by mouth 2 (two) times daily. 60 tablet 0   busPIRone (BUSPAR) 15 MG tablet Take 1 tablet (15 mg total) by mouth 2 (two) times daily. 180 tablet 1   ELDERBERRY PO Take by mouth.     fluconazole (DIFLUCAN) 150 MG tablet Take 1 tablet (150 mg total) by mouth every 3 (three) days as needed. 2 tablet 0   hydrOXYzine (VISTARIL) 25 MG capsule Take 1-2 capsules (25-50 mg total) by mouth at bedtime as needed for sleep 180 capsule 0   levocetirizine (XYZAL) 5 MG tablet Take 5 mg by mouth every evening.     levonorgestrel (MIRENA) 20 MCG/24HR IUD 1 each by Intrauterine route once.  Multiple Vitamin (MULTIVITAMIN) tablet Take 1 tablet by mouth daily.     nystatin cream (MYCOSTATIN) Apply 1 Application topically 2 (two) times daily. 60 g 0   Phentermine HCl (LOMAIRA) 8 MG TABS Take 1 tablet (8 mg total) by mouth daily. 30 tablet 0   Probiotic Product (PROBIOTIC-10 PO) Take by mouth.     propranolol (INDERAL) 20 MG tablet Take 1 tablet (20 mg total) by mouth 2 (two) times daily as needed 60 tablet 1   sertraline (ZOLOFT) 50 MG tablet Take 1 tablet (50 mg total) by mouth daily. 90 tablet 0   spironolactone (ALDACTONE) 100 MG tablet Take 1 tablet (100 mg total) by mouth daily. 90 tablet 3   topiramate (TOPAMAX) 50 MG tablet Take 1 tablet (50 mg total) by mouth daily. 60 tablet 0   triamcinolone cream (KENALOG) 0.1 % Apply 1 Application topically 2 (two) times daily. 30 g 0   Vitamin D, Ergocalciferol, (DRISDOL) 1.25 MG (50000 UNIT) CAPS capsule Take 1 capsule (50,000 Units total) by mouth every 7 (seven) days. 12 capsule 0   zolpidem (AMBIEN CR) 6.25 MG CR tablet Take 1 tablet (6.25 mg total) by mouth at bedtime as needed for sleep. 30 tablet 2   buPROPion (WELLBUTRIN) 75 MG tablet Take 1 tablet (75 mg total) by mouth as directed for 18 days. Take 1 tablet daily for 15 days and then take 1 tablet every other day for 3 doses and  stop taking 18 tablet 0   No current facility-administered medications for this visit.     Musculoskeletal: Strength & Muscle Tone: within normal limits Gait & Station: normal Patient leans: N/A  Psychiatric Specialty Exam: Review of Systems  Psychiatric/Behavioral:  Positive for sleep disturbance. The patient is nervous/anxious.     Blood pressure 122/86, pulse 85, temperature 98 F (36.7 C), temperature source Temporal, height 5\' 10"  (1.778 m), weight 258 lb 9.6 oz (117.3 kg), SpO2 98%.Body mass index is 37.11 kg/m.  General Appearance: Casual  Eye Contact:  Fair  Speech:  Normal Rate  Volume:  Normal  Mood:  Anxious managing well  Affect:  Congruent and Full Range  Thought Process:  Goal Directed and Descriptions of Associations: Intact  Orientation:  Full (Time, Place, and Person)  Thought Content: Logical   Suicidal Thoughts:  No  Homicidal Thoughts:  No  Memory:  Immediate;   Fair Recent;   Fair Remote;   Fair  Judgement:  Fair  Insight:  Fair  Psychomotor Activity:  Normal  Concentration:  Concentration: Fair and Attention Span: Fair  Recall:  [employer] of Knowledge: Fair  Language: Fair  Akathisia:  No  Handed:  Right  AIMS (if indicated): not done  Assets:  Desire for Improvement Housing Social Support  ADL's:  Intact  Cognition: WNL  Sleep:   varies   Screenings: GAD-7    Loss adjuster, chartered Office Visit from 10/23/2023 in [facility] Office Visit from 08/27/2023 in [facility] Office Visit from 03/18/2023 in [facility] Office Visit from 01/23/2023 in [facility] Video Visit from 12/07/2020 in [facility]  Total GAD-7 Score 4 5 4 12 1       PHQ2-9    Flowsheet Row Office Visit from 10/23/2023 in [facility] Office Visit  from 08/27/2023 in [facility] Office Visit from 03/18/2023 in [facility] Office Visit from 01/23/2023 in [facility] Video Visit from 08/06/2022 in [facility]  PHQ-2 Total Score 0 1 1 3  0  PHQ-9 Total Score -- -- 8 14 --      Flowsheet Row Office Visit from 10/23/2023 in [facility] Video Visit from 10/01/2023 in [facility] Office Visit from 08/27/2023 in [facility]  C-SSRS RISK CATEGORY No Risk No Risk No Risk        Assessment and Plan: MARSELA KUAN is a 38 year old Caucasian female, divorced, employed, has a history of depression, anxiety, ADHD was evaluated in office today.  Discussed assessment and plan as noted below.  Generalized Anxiety Disorder-stable Anxiety is more controlled with current medication regimen. She reports feeling less anxious about ongoing personal stressors, possibly due to medication. Currently taking Buspar 15 mg twice daily and Zoloft 50 mg daily. Discussed affordable therapy options including Open Education officer, community and [facility].   - Continue Buspar 15 mg twice daily.   - Continue Zoloft 50 mg daily.   - Explore therapy options such as Open Education officer, community and [facility].    Depression in remission Depression currently in remission. Tapering off Wellbutrin with no reported changes in mood or symptoms. She is on the last week of tapering, taking it every other day.   - Continue tapering off Wellbutrin as planned.   - Continue Zoloft as prescribed  Attention-Deficit/Hyperactivity Disorder (ADHD)-improving She reports better concentration with Adderall 10 mg twice daily compared to Vyvanse. No desire to increase dosage at this time. Prefers current regimen as it helps maintain  focus without significant peaks and troughs.   - Continue Adderall 10 mg twice daily.   - Reviewed [place] PMP AWARxE   Insomnia-unstable She reports waking up around 3-4 AM several times a week despite taking Ambien 6.25 mg. Alternates with hydroxyzine but experiences next-day drowsiness. Considering adding melatonin. Discussed that melatonin should be taken 90 minutes before bedtime and to continue alternating with hydroxyzine as needed.   - Continue Ambien 6.25 mg as needed.   - Consider adding Melatonin 90 minutes before bedtime.   - Alternate Ambien with Hydroxyzine 25 to 50 mg at bedtime as needed.     Follow-up   - Schedule follow-up visit for mid-May.   - Pick up Adderall prescription from the pharmacy and notify the provider.  Collaboration of Care: Collaboration of Care: Referral or follow-up with counselor/therapist AEB patient encouraged to establish care with therapist  Patient/Guardian was advised Release of Information must be obtained prior to any record release in order to collaborate their care with an outside provider. Patient/Guardian was advised if they have not already done so to contact the registration department to sign all necessary forms in order for [name] to release information regarding their care.   Consent: Patient/Guardian gives verbal consent for treatment and assignment of benefits for services provided during this visit. Patient/Guardian expressed understanding and agreed to proceed.  Discussed the use of a AI scribe software for clinical note transcription with the patient, who gave verbal consent to proceed.  This note was generated in part or whole with voice recognition software. Voice recognition is usually quite accurate but there are transcription errors that can and very often do occur. I apologize for any typographical errors that were not detected and corrected.     Jomarie Longs, MD 10/24/2023, 12:23  PM

## 2023-10-23 ENCOUNTER — Encounter: Payer: Self-pay | Admitting: Psychiatry

## 2023-10-23 ENCOUNTER — Ambulatory Visit (INDEPENDENT_AMBULATORY_CARE_PROVIDER_SITE_OTHER): Payer: 59 | Admitting: Psychiatry

## 2023-10-23 VITALS — BP 122/86 | HR 85 | Temp 98.0°F | Ht 70.0 in | Wt 258.6 lb

## 2023-10-23 DIAGNOSIS — F3342 Major depressive disorder, recurrent, in full remission: Secondary | ICD-10-CM

## 2023-10-23 DIAGNOSIS — F900 Attention-deficit hyperactivity disorder, predominantly inattentive type: Secondary | ICD-10-CM

## 2023-10-23 DIAGNOSIS — F411 Generalized anxiety disorder: Secondary | ICD-10-CM

## 2023-10-23 DIAGNOSIS — F5105 Insomnia due to other mental disorder: Secondary | ICD-10-CM

## 2023-11-05 ENCOUNTER — Encounter (INDEPENDENT_AMBULATORY_CARE_PROVIDER_SITE_OTHER): Payer: Self-pay | Admitting: Adult Health

## 2023-11-05 ENCOUNTER — Other Ambulatory Visit: Payer: Self-pay

## 2023-11-05 ENCOUNTER — Ambulatory Visit (INDEPENDENT_AMBULATORY_CARE_PROVIDER_SITE_OTHER): Payer: 59 | Admitting: Adult Health

## 2023-11-05 VITALS — BP 121/75 | HR 87 | Temp 97.6°F | Ht 70.0 in | Wt 258.0 lb

## 2023-11-05 DIAGNOSIS — E669 Obesity, unspecified: Secondary | ICD-10-CM | POA: Diagnosis not present

## 2023-11-05 DIAGNOSIS — E88819 Insulin resistance, unspecified: Secondary | ICD-10-CM | POA: Diagnosis not present

## 2023-11-05 DIAGNOSIS — R632 Polyphagia: Secondary | ICD-10-CM

## 2023-11-05 DIAGNOSIS — Z6837 Body mass index (BMI) 37.0-37.9, adult: Secondary | ICD-10-CM

## 2023-11-05 DIAGNOSIS — F419 Anxiety disorder, unspecified: Secondary | ICD-10-CM | POA: Diagnosis not present

## 2023-11-05 DIAGNOSIS — E559 Vitamin D deficiency, unspecified: Secondary | ICD-10-CM

## 2023-11-05 DIAGNOSIS — F32A Depression, unspecified: Secondary | ICD-10-CM

## 2023-11-05 DIAGNOSIS — Z6839 Body mass index (BMI) 39.0-39.9, adult: Secondary | ICD-10-CM

## 2023-11-05 DIAGNOSIS — Z0000 Encounter for general adult medical examination without abnormal findings: Secondary | ICD-10-CM

## 2023-11-05 DIAGNOSIS — E66812 Obesity, class 2: Secondary | ICD-10-CM

## 2023-11-05 MED ORDER — TOPIRAMATE 50 MG PO TABS
50.0000 mg | ORAL_TABLET | Freq: Every day | ORAL | 0 refills | Status: DC
  Filled 2023-11-05: qty 60, 60d supply, fill #0

## 2023-11-05 MED ORDER — SERTRALINE HCL 50 MG PO TABS
50.0000 mg | ORAL_TABLET | Freq: Every day | ORAL | 0 refills | Status: DC
  Filled 2023-11-05 – 2024-03-29 (×2): qty 90, 90d supply, fill #0

## 2023-11-05 MED ORDER — PHENTERMINE HCL 15 MG PO CAPS
15.0000 mg | ORAL_CAPSULE | ORAL | 0 refills | Status: DC
  Filled 2023-11-05: qty 30, 30d supply, fill #0

## 2023-11-05 MED ORDER — VITAMIN D (ERGOCALCIFEROL) 1.25 MG (50000 UNIT) PO CAPS
50000.0000 [IU] | ORAL_CAPSULE | ORAL | 0 refills | Status: DC
  Filled 2023-11-05: qty 12, 84d supply, fill #0

## 2023-11-05 NOTE — Progress Notes (Signed)
 WEIGHT SUMMARY AND BIOMETRICS  Vitals Temp: 97.6 F (36.4 C) BP: 121/75 Pulse Rate: 87 SpO2: 99 %   Anthropometric Measurements Height: 5\' 10"  (1.778 m) Weight: 258 lb (117 kg) BMI (Calculated): 37.02 Weight at Last Visit: 258 lb Weight Lost Since Last Visit: 0 lb Weight Gained Since Last Visit: 258 lb Starting Weight: 273 lb Total Weight Loss (lbs): 15 lb (6.804 kg)   Body Composition  Body Fat %: 42.1 % Fat Mass (lbs): 108.8 lbs Muscle Mass (lbs): 142.2 lbs Total Body Water (lbs): 97.2 lbs Visceral Fat Rating : 10   Other Clinical Data RMR: [id number] Fasting: no Labs: bo Today's Visit #: 75 Starting Date: 01/09/18    Chief Complaint:   OBESITY Rhonda [name] is here to discuss her progress with her obesity treatment plan.  She is on the the Category 3 Plan and states she is following her eating plan approximately 50 % of the time.  She states she is exercising Strength Training 20 minutes 1 times per week.   Interim History:  She has given up diet sodas for [place] This has eliminated 2-3 diet sodas per day for her (Diet [address] Pepsi Zero).  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 She reports walking frequently, >10K steps/day  Subjective:   1. Healthcare maintenance Discussed Labs Lipid Panel     Component Value Date/Time   CHOL 174 10/07/2023 1140   TRIG 112 10/07/2023 1140   HDL 41 10/07/2023 1140   CHOLHDL 4.2 10/07/2023 1140   LDLCALC 113 (H) 10/07/2023 1140   LABVLDL 20 10/07/2023 1140     Latest Reference Range & Units 10/07/23 11:40  TSH 0.450 - 4.500 uIU/mL 2.170  T4,Free(Direct) 0.82 - 1.77 ng/dL 1.61   Lipid panel stable, with slight elevation in LDL Thyroid panel is normal She is not on Levothyroxine  2. Vitamin D deficiency  Latest Reference Range & Units 09/02/23 09:13  Vitamin D, 25-Hydroxy 30.0 - 100.0 ng/mL 35.3   Level low normal and below goal of 50-70  3. Insulin resistance  Latest Reference Range &  Units 04/02/22 11:04 10/04/22 09:04 01/24/23 09:39 09/02/23 09:13  INSULIN 2.6 - 24.9 uIU/mL 17.1 10.8 8.8 11.7   Has been on GLP and GIP/GLP therapy in past- tolerated well Current insurance will not cover injection therapy  4. Polyphagia 08/05/2023 She was started on Lomaira and Topamax She is currently on daily Lomaira 8mg  and Topamax 50mg  PDMP checked with no concerns noted.  Patient is on Vyvanse for ADHD and we discussed possibility of side effects from additional stimulant.   She reports increased appetite the last several weeks. She denies increased anxiety or insomnia with morning dose of Lomaira 8mg  Discussed risks/benefits of increasing stimulant.  Of note- She has Mirena IUD and her boyfriend has had successful vasectomy    5.Anxiety and depression Sertraline was started by [facility] 08/05/2023- she is currently on daily Sertraline 50mg  She feels that her overall anxiety levels have appreciably reduced. She is also on Buspar 15mg  BID She is weaning off Wellbutrin therapy per Mental Health Care Provider.  Assessment/Plan:   1. Healthcare maintenance Increase protein and regular exercise  2. Vitamin D deficiency (Primary) Refill Vitamin D, Ergocalciferol, (DRISDOL) 1.25 MG (50000 UNIT) CAPS capsule Take 1 capsule (50,000 Units total) by mouth every 7 (seven) days. Dispense: 12 capsule, Refills: 0 of 0 remaining   3. Insulin resistance Increase protein and regular exercise  4. Polyphagia Refill  topiramate (TOPAMAX) 50  MG tablet Take 1 tablet (50 mg total) by mouth daily. Dispense: 60 tablet, Refills: 0 of 0 remaining  Refill and INCREASE phentermine 15 MG capsule Take 1 capsule (15 mg total) by mouth every morning. Dispense: 30 capsule, Refills: 0 of 0 remaining  She will mychart if she develops any side effects.  5.Anxiety and depression Refill - sertraline (ZOLOFT) 50 MG tablet; Take 1 tablet (50 mg total) by mouth daily.  Dispense: 90 tablet; Refill: 0  6.  Obesity, current BMI 37.1  Rhonda [name] is currently in the action stage of change. As such, her goal is to continue with weight loss efforts. She has agreed to the Category 3 Plan.   Exercise goals: For substantial health benefits, adults should do at least 150 minutes (2 hours and 30 minutes) a week of moderate-intensity, or 75 minutes (1 hour and 15 minutes) a week of vigorous-intensity aerobic physical activity, or an equivalent combination of moderate- and vigorous-intensity aerobic activity. Aerobic activity should be performed in episodes of at least 10 minutes, and preferably, it should be spread throughout the week.  Behavioral modification strategies: increasing lean protein intake, decreasing simple carbohydrates, increasing vegetables, increasing water intake, no skipping meals, meal planning and cooking strategies, and keeping healthy foods in the home.  Rhonda [name] has agreed to follow-up with our clinic in 4 weeks. She was informed of the importance of frequent follow-up visits to maximize her success with intensive lifestyle modifications for her multiple health conditions.   Objective:   Blood pressure 121/75, pulse 87, temperature 97.6 F (36.4 C), height 5\' 10"  (1.778 m), weight 258 lb (117 kg), SpO2 99%. Body mass index is 37.02 kg/m.  General: Cooperative, alert, well developed, in no acute distress. HEENT: Conjunctivae and lids unremarkable. Cardiovascular: Regular rhythm.  Lungs: Normal work of breathing. Neurologic: No focal deficits.   Lab Results  Component Value Date   CREATININE 0.89 09/02/2023   BUN 9 09/02/2023   NA 138 09/02/2023   K 4.4 09/02/2023   CL 105 09/02/2023   CO2 19 (L) 09/02/2023   Lab Results  Component Value Date   ALT 27 09/02/2023   AST 23 09/02/2023   ALKPHOS 88 09/02/2023   BILITOT 0.5 09/02/2023   Lab Results  Component Value Date   HGBA1C 5.3 09/02/2023   HGBA1C 5.0 01/24/2023   HGBA1C 5.0 10/04/2022   HGBA1C 4.9 04/02/2022    HGBA1C 5.0 08/23/2021   Lab Results  Component Value Date   INSULIN 11.7 09/02/2023   INSULIN 8.8 01/24/2023   INSULIN 10.8 10/04/2022   INSULIN 17.1 04/02/2022   INSULIN 7.0 08/23/2021   Lab Results  Component Value Date   TSH 2.170 10/07/2023   Lab Results  Component Value Date   CHOL 174 10/07/2023   HDL 41 10/07/2023   LDLCALC 113 (H) 10/07/2023   TRIG 112 10/07/2023   CHOLHDL 4.2 10/07/2023   Lab Results  Component Value Date   VD25OH 35.3 09/02/2023   VD25OH 45.4 01/24/2023   VD25OH 38.5 10/04/2022   Lab Results  Component Value Date   WBC 4.7 01/09/2018   HGB 14.5 01/09/2018   HCT 42.1 01/09/2018   MCV 89 01/09/2018   PLT 195 09/20/2016   No results found for: "IRON", "TIBC", "FERRITIN"  Attestation Statements:   Reviewed by clinician on day of visit: allergies, medications, problem list, medical history, surgical history, family history, social history, and previous encounter notes.  I have reviewed the above documentation for accuracy and completeness, and  I agree with the above. -  Radiah Lubinski d. Mairim Bade, NP-C

## 2023-11-06 ENCOUNTER — Other Ambulatory Visit: Payer: Self-pay

## 2023-11-08 ENCOUNTER — Other Ambulatory Visit: Payer: Self-pay

## 2023-11-13 ENCOUNTER — Other Ambulatory Visit: Payer: Self-pay

## 2023-11-20 ENCOUNTER — Other Ambulatory Visit: Payer: Self-pay

## 2023-11-20 ENCOUNTER — Telehealth: Admitting: Physician Assistant

## 2023-11-20 DIAGNOSIS — R3989 Other symptoms and signs involving the genitourinary system: Secondary | ICD-10-CM

## 2023-11-20 MED ORDER — NITROFURANTOIN MONOHYD MACRO 100 MG PO CAPS
100.0000 mg | ORAL_CAPSULE | Freq: Two times a day (BID) | ORAL | 0 refills | Status: DC
  Filled 2023-11-20: qty 10, 5d supply, fill #0

## 2023-11-20 NOTE — Progress Notes (Signed)

## 2023-12-11 ENCOUNTER — Other Ambulatory Visit: Payer: Self-pay

## 2023-12-17 ENCOUNTER — Telehealth: Admitting: Physician Assistant

## 2023-12-17 DIAGNOSIS — R21 Rash and other nonspecific skin eruption: Secondary | ICD-10-CM

## 2023-12-17 NOTE — Progress Notes (Signed)
 E Visit for Rash  We are sorry that you are not feeling well. Here is how we plan to help!  Based on what you shared with me it looks like you have contact dermatitis.  Contact dermatitis is a skin rash caused by something that touches the skin and causes irritation or inflammation.  Your skin may be red, swollen, dry, cracked, and itch.  The rash should go away in a few days but can last a few weeks.  If you get a rash, it's important to figure out what caused it so the irritant can be avoided in the future.  This is not shingles as it is on both sides of the body. I will send a steroid cream- Thanks Rhonda [name]   HOME CARE:  Take cool showers and avoid direct sunlight. Apply cool compress or wet dressings. Take a bath in an oatmeal bath.  Sprinkle content of one Aveeno packet under running faucet with comfortably warm water.  Bathe for 15-20 minutes, 1-2 times daily.  Pat dry with a towel. Do not rub the rash. Use hydrocortisone cream. Take an antihistamine like Benadryl  for widespread rashes that itch.  The adult dose of Benadryl  is 25-50 mg by mouth 4 times daily. Caution:  This type of medication may cause sleepiness.  Do not drink alcohol, drive, or operate dangerous machinery while taking antihistamines.  Do not take these medications if you have prostate enlargement.  Read package instructions thoroughly on all medications that you take.  GET HELP RIGHT AWAY IF:  Symptoms don't go away after treatment. Severe itching that persists. If you rash spreads or swells. If you rash begins to smell. If it blisters and opens or develops a yellow-brown crust. You develop a fever. You have a sore throat. You become short of breath.  MAKE SURE YOU:  Understand these instructions. Will watch your condition. Will get help right away if you are not doing well or get worse.  Thank you for choosing an e-visit.  Your e-visit answers were reviewed by a board certified advanced clinical  practitioner to complete your personal care plan. Depending upon the condition, your plan could have included both over the counter or prescription medications.  Please review your pharmacy choice. Make sure the pharmacy is open so you can pick up prescription now. If there is a problem, you may contact your provider through [employer] and have the prescription routed to another pharmacy.  Your safety is important to us . If you have drug allergies check your prescription carefully.   For the next 24 hours you can use MyChart to ask questions about today's visit, request a non-urgent call back, or ask for a work or school excuse. You will get an email in the next two days asking about your experience. I hope that your e-visit has been valuable and will speed your recovery.    have provided 5 minutes of non face to face time during this encounter for chart review and documentation.

## 2023-12-18 ENCOUNTER — Other Ambulatory Visit: Payer: Self-pay

## 2023-12-18 DIAGNOSIS — L309 Dermatitis, unspecified: Secondary | ICD-10-CM | POA: Diagnosis not present

## 2023-12-18 MED ORDER — PREDNISONE 10 MG PO TABS
ORAL_TABLET | ORAL | 0 refills | Status: AC
Start: 2023-12-18 — End: 2023-12-30
  Filled 2023-12-18: qty 30, 12d supply, fill #0

## 2023-12-20 ENCOUNTER — Other Ambulatory Visit: Payer: Self-pay | Admitting: Psychiatry

## 2023-12-20 ENCOUNTER — Other Ambulatory Visit: Payer: Self-pay

## 2023-12-20 ENCOUNTER — Other Ambulatory Visit (INDEPENDENT_AMBULATORY_CARE_PROVIDER_SITE_OTHER): Payer: Self-pay | Admitting: Adult Health

## 2023-12-20 DIAGNOSIS — F900 Attention-deficit hyperactivity disorder, predominantly inattentive type: Secondary | ICD-10-CM

## 2023-12-22 ENCOUNTER — Other Ambulatory Visit: Payer: Self-pay

## 2023-12-22 ENCOUNTER — Other Ambulatory Visit (INDEPENDENT_AMBULATORY_CARE_PROVIDER_SITE_OTHER): Payer: Self-pay | Admitting: Adult Health

## 2023-12-22 MED FILL — Amphetamine-Dextroamphetamine Tab 10 MG: ORAL | 30 days supply | Qty: 60 | Fill #0 | Status: AC

## 2023-12-23 ENCOUNTER — Encounter (INDEPENDENT_AMBULATORY_CARE_PROVIDER_SITE_OTHER): Payer: Self-pay | Admitting: Adult Health

## 2023-12-23 ENCOUNTER — Encounter: Payer: Self-pay | Admitting: Psychiatry

## 2023-12-23 ENCOUNTER — Other Ambulatory Visit: Payer: Self-pay

## 2023-12-23 ENCOUNTER — Telehealth (INDEPENDENT_AMBULATORY_CARE_PROVIDER_SITE_OTHER): Admitting: Psychiatry

## 2023-12-23 DIAGNOSIS — F5105 Insomnia due to other mental disorder: Secondary | ICD-10-CM

## 2023-12-23 DIAGNOSIS — F3342 Major depressive disorder, recurrent, in full remission: Secondary | ICD-10-CM | POA: Diagnosis not present

## 2023-12-23 DIAGNOSIS — F411 Generalized anxiety disorder: Secondary | ICD-10-CM

## 2023-12-23 DIAGNOSIS — F900 Attention-deficit hyperactivity disorder, predominantly inattentive type: Secondary | ICD-10-CM | POA: Diagnosis not present

## 2023-12-23 NOTE — Progress Notes (Signed)
 Virtual Visit via Video Note  I connected with Rhonda [name] on 12/23/23 at  1:30 PM EDT by a video enabled telemedicine application and verified that I am speaking with the correct person using two identifiers.  Location Provider Location : [facility] Patient Location : Home  Participants: Patient , Provider    I discussed the limitations of evaluation and management by telemedicine and the availability of in person appointments. The patient expressed understanding and agreed to proceed.   I discussed the assessment and treatment plan with the patient. The patient was provided an opportunity to ask questions and all were answered. The patient agreed with the plan and demonstrated an understanding of the instructions.   The patient was advised to call back or seek an in-person evaluation if the symptoms worsen or if the condition fails to improve as anticipated.   BH MD OP Progress Note  12/23/2023 2:17 PM RANADA GUEBARA  MRN:  [id number]  Chief Complaint:  Chief Complaint  Patient presents with   Follow-up   Anxiety   Depression   Medication Refill   Discussed the use of AI scribe software for clinical note transcription with the patient, who gave verbal consent to proceed.  History of Present Illness Rhonda [name] is a 38 year old Caucasian female, divorced, employed, lives in [place], has a history of MDD, GAD, insomnia, ADHD was evaluated by telemedicine today.  She has discontinued Wellbutrin  and reports feeling fine without it. She continues to take Adderall 10 mg twice a day but not consistently, as she sometimes forgets or feels it is unnecessary depending on her daily activities. She finds this regimen effective compared to Vyvanse .  Her work environment is stressful yet manageable, with a flexible schedule that leads to procrastination, a challenge she has faced since her education. 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  She currently denies any significant depression symptoms and reports anxiety is manageable.  Reports sleep is overall good on the current medication regimen.  She currently lacks a therapist due to financial constraints but intends to pursue therapy after her son completes his sessions and she meets her deductible. She seeks assistance in managing tasks and life skills rather than emotional support, which was the focus of her previous therapy.  She is on prednisone  for a rash on her hands, which causes numbness and tingling. This rash is suspected to be an allergic reaction or irritant exposure. She has a history of eczema and believes the rash resembles dyshidrotic eczema, although the numbness and tingling are atypical for her.  She is taking phentermine  and has experienced weight gain, which she attributes to frequent dining out due to a busy work schedule. She acknowledges the need for lifestyle modifications, including meal planning and exercise, to manage her weight.  She is currently compliant on medications as prescribed, uses the Adderall as needed, Ambien  as needed and is compliant on Zoloft  and BuSpar  as prescribed.  Denies side effects.  She was able to successfully taper herself off of the Wellbutrin  with no worsening mood symptoms since then.    Visit Diagnosis:    ICD-10-CM   1. Recurrent major depressive disorder, in full remission (HCC)  F33.42     2. GAD (generalized anxiety disorder)  F41.1     3. Insomnia due to mental condition  F51.05    Anxiety, depression    4. Attention deficit hyperactivity disorder (ADHD), predominantly inattentive type  F90.0  Past Psychiatric History: I have reviewed past psychiatric history from progress note on 11/05/2018.  Past Medical History:  Past Medical History:  Diagnosis Date   Depression    treated   PCOS (polycystic ovarian syndrome) 2008   Plantar fasciitis     Past Surgical History:  Procedure Laterality Date    CHOLECYSTECTOMY      Family Psychiatric History: I have reviewed family psychiatric history from progress note on 11/05/2018.  Family History:  Family History  Problem Relation Age of Onset   High blood pressure Mother    Depression Father    Liver disease Father    Alcoholism Father    Alcohol abuse Father    Diabetes Paternal Grandmother    Bipolar disorder Maternal Aunt     Social History: I have reviewed social history from progress note on 11/05/2018. Social History   Socioeconomic History   Marital status: Divorced    Spouse name: Arlyce Lambert   Number of children: 2   Years of education: Not on file   Highest education level: Bachelor's degree (e.g., BA, AB, BS)  Occupational History   Occupation: RN  Tobacco Use   Smoking status: Never   Smokeless tobacco: Never  Vaping Use   Vaping status: Never Used  Substance and Sexual Activity   Alcohol use: Yes    Alcohol/week: 1.0 standard drink of alcohol    Types: 1 Glasses of wine per week    Comment: Occasional once a week.   Drug use: No   Sexual activity: Yes    Partners: Male    Birth control/protection: None, I.U.D.  Other Topics Concern   Not on file  Social History Narrative   Not on file   Social Drivers of Health   Financial Resource Strain: Low Risk  (06/19/2023)   Received from [facility]   Overall Financial Resource Strain (CARDIA)    Difficulty of Paying Living Expenses: Not very hard  Food Insecurity: No Food Insecurity (06/19/2023)   Received from [facility]   Hunger Vital Sign    Worried About Running Out of Food in the Last Year: Never true    Ran Out of Food in the Last Year: Never true  Transportation Needs: No Transportation Needs (06/19/2023)   Received from [facility] - Transportation    In the past 12 months, has lack of transportation kept you from medical appointments or from getting medications?: No    Lack of  Transportation (Non-Medical): No  Physical Activity: Inactive (11/05/2018)   Exercise Vital Sign    Days of Exercise per Week: 0 days    Minutes of Exercise per Session: 0 min  Stress: Stress Concern Present (11/05/2018)   [employer] of Occupational Health - Occupational Stress Questionnaire    Feeling of Stress : Rather much  Social Connections: Unknown (11/05/2018)   Social Connection and Isolation Panel [NHANES]    Frequency of Communication with Friends and Family: Not on file    Frequency of Social Gatherings with Friends and Family: Not on file    Attends Religious Services: More than 4 times per year    Active Member of [employer] or Organizations: No    Attends Banker Meetings: Never    Marital Status: Married    Allergies: No Known Allergies  Metabolic Disorder Labs: Lab Results  Component Value Date   HGBA1C 5.3 09/02/2023   No results found for: "PROLACTIN" Lab Results  Component  Value Date   CHOL 174 10/07/2023   TRIG 112 10/07/2023   HDL 41 10/07/2023   CHOLHDL 4.2 10/07/2023   LDLCALC 113 (H) 10/07/2023   LDLCALC 75 04/02/2022   Lab Results  Component Value Date   TSH 2.170 10/07/2023   TSH 1.680 01/24/2023    Therapeutic Level Labs: No results found for: "LITHIUM" No results found for: "VALPROATE" No results found for: "CBMZ"  Current Medications: Current Outpatient Medications  Medication Sig Dispense Refill   amphetamine -dextroamphetamine  (ADDERALL) 10 MG tablet Take 1 tablet (10 mg total) by mouth 2 (two) times daily. 60 tablet 0   busPIRone  (BUSPAR ) 15 MG tablet Take 1 tablet (15 mg total) by mouth 2 (two) times daily. 180 tablet 1   ELDERBERRY PO Take by mouth.     fluconazole  (DIFLUCAN ) 150 MG tablet Take 1 tablet (150 mg total) by mouth every 3 (three) days as needed. 2 tablet 0   hydrOXYzine  (VISTARIL ) 25 MG capsule Take 1-2 capsules (25-50 mg total) by mouth at bedtime as needed for sleep 180 capsule 0   levocetirizine (XYZAL)  5 MG tablet Take 5 mg by mouth every evening.     levonorgestrel  (MIRENA ) 20 MCG/24HR IUD 1 each by Intrauterine route once.     Multiple Vitamin (MULTIVITAMIN) tablet Take 1 tablet by mouth daily.     nitrofurantoin , macrocrystal-monohydrate, (MACROBID ) 100 MG capsule Take 1 capsule (100 mg total) by mouth 2 (two) times daily. 10 capsule 0   nystatin  cream (MYCOSTATIN ) Apply 1 Application topically 2 (two) times daily. 60 g 0   phentermine  15 MG capsule Take 1 capsule (15 mg total) by mouth every morning. 30 capsule 0   predniSONE  (DELTASONE ) 10 MG tablet Take 4 tablets (40 mg total) by mouth daily for 3 days, THEN 3 tablets (30 mg total) daily for 3 days, THEN 2 tablets (20 mg total) daily for 3 days, THEN 1 tablet (10 mg total) daily for 3 days. 30 tablet 0   Probiotic Product (PROBIOTIC-10 PO) Take by mouth.     propranolol  (INDERAL ) 20 MG tablet Take 1 tablet (20 mg total) by mouth 2 (two) times daily as needed 60 tablet 1   sertraline  (ZOLOFT ) 50 MG tablet Take 1 tablet (50 mg total) by mouth daily. 90 tablet 0   spironolactone  (ALDACTONE ) 100 MG tablet Take 1 tablet (100 mg total) by mouth daily. 90 tablet 3   topiramate  (TOPAMAX ) 50 MG tablet Take 1 tablet (50 mg total) by mouth daily. 60 tablet 0   triamcinolone  cream (KENALOG ) 0.1 % Apply 1 Application topically 2 (two) times daily. 30 g 0   Vitamin D , Ergocalciferol , (DRISDOL ) 1.25 MG (50000 UNIT) CAPS capsule Take 1 capsule (50,000 Units total) by mouth every 7 (seven) days. 12 capsule 0   zolpidem  (AMBIEN  CR) 6.25 MG CR tablet Take 1 tablet (6.25 mg total) by mouth at bedtime as needed for sleep. 30 tablet 2   No current facility-administered medications for this visit.     Musculoskeletal: Strength & Muscle Tone:  UTA Gait & Station:  Seated Patient leans: N/A  Psychiatric Specialty Exam: Review of Systems  Psychiatric/Behavioral:  The patient is nervous/anxious.     There were no vitals taken for this visit.There is no  height or weight on file to calculate BMI.  General Appearance: Casual  Eye Contact:  Fair  Speech:  Clear and Coherent  Volume:  Normal  Mood:  Anxious  Affect:  Congruent  Thought Process:  Goal  Directed and Descriptions of Associations: Intact  Orientation:  Full (Time, Place, and Person)  Thought Content: Logical   Suicidal Thoughts:  No  Homicidal Thoughts:  No  Memory:  Immediate;   Fair Recent;   Fair Remote;   Fair  Judgement:  Fair  Insight:  Fair  Psychomotor Activity:  Normal  Concentration:  Concentration: Fair and Attention Span: Fair  Recall:  [employer] of Knowledge: Fair  Language: Fair  Akathisia:  No  Handed:  Right  AIMS (if indicated): not done  Assets:  Desire for Improvement Housing Social Support Transportation  ADL's:  Intact  Cognition: WNL  Sleep:  Fair   Screenings: GAD-7    Garment/textile technologist Visit from 10/23/2023 in [facility] Office Visit from 08/27/2023 in [facility] Office Visit from 03/18/2023 in [facility] Office Visit from 01/23/2023 in [facility] Video Visit from 12/07/2020 in [facility]  Total GAD-7 Score 4 5 4 12 1       PHQ2-9    Flowsheet Row Office Visit from 10/23/2023 in [facility] Office Visit from 08/27/2023 in [facility] Office Visit from 03/18/2023 in [facility] Office Visit from 01/23/2023 in [facility] Video Visit from 08/06/2022 in [facility]  PHQ-2 Total Score 0 1 1 3  0  PHQ-9 Total Score -- -- 8 14 --      Flowsheet Row Video Visit from 12/23/2023 in [facility] Office Visit from  10/23/2023 in [facility] Video Visit from 10/01/2023 in [facility]  C-SSRS RISK CATEGORY No Risk No Risk No Risk        Assessment and Plan: Rhonda [name] is a 38 year old Caucasian female, divorced, employed, has a history of depression, anxiety, ADHD was evaluated by telemedicine today, discussed assessment and plan as noted below.  Generalized anxiety disorder-stable Anxiety is managed on the current medication regimen. - Continue BuSpar  15 mg twice daily - Continue Zoloft  50 mg daily   Depression in remission Currently denies any significant depression symptoms was able to taper herself off of the Wellbutrin  with no worsening mood symptoms. - Continue Zoloft  50 mg daily  Attention deficit hyperactivity disorder-improving Reports no current dosage as beneficial for attention and focus although continues to struggle with procrastination and coming up with structure.  She is aware she needs to work with the psychotherapist however financial constraints limits her ability to do that at this time. - Continue Adderall 10 mg twice daily - Reviewed [place] PMP AWARxE   Insomnia-improving Currently reports sleep is improving on medication, Ambien  as needed. - Continue Ambien  6.25 mg as needed - Consider Melatonin as needed.   - Continue Hydroxyzine  25-50 mg at bedtime as needed. -  Continue sleep hygiene techniques.  Provided brief supportive psychotherapy.  Follow-up Follow-up in clinic in 3 months or sooner in person.   Collaboration of Care: Collaboration of Care: Referral or follow-up with counselor/therapist AEB encouraged to establish care with therapist.  Patient/Guardian was advised Release of Information must be obtained prior to any record release in order to collaborate their care with an outside provider. Patient/Guardian was advised if they have not already done so to contact the  registration department to sign all  necessary forms in order for us  to release information regarding their care.   Consent: Patient/Guardian gives verbal consent for treatment and assignment of benefits for services provided during this visit. Patient/Guardian expressed understanding and agreed to proceed.  This note was generated in part or whole with voice recognition software. Voice recognition is usually quite accurate but there are transcription errors that can and very often do occur. I apologize for any typographical errors that were not detected and corrected.     Jaculin Rasmus, MD 12/23/2023, 2:17 PM

## 2023-12-25 ENCOUNTER — Other Ambulatory Visit: Payer: Self-pay

## 2023-12-25 ENCOUNTER — Ambulatory Visit (INDEPENDENT_AMBULATORY_CARE_PROVIDER_SITE_OTHER): Admitting: Adult Health

## 2023-12-25 ENCOUNTER — Encounter (INDEPENDENT_AMBULATORY_CARE_PROVIDER_SITE_OTHER): Payer: Self-pay | Admitting: Adult Health

## 2023-12-25 ENCOUNTER — Telehealth (INDEPENDENT_AMBULATORY_CARE_PROVIDER_SITE_OTHER): Admitting: Adult Health

## 2023-12-25 ENCOUNTER — Encounter (INDEPENDENT_AMBULATORY_CARE_PROVIDER_SITE_OTHER): Payer: Self-pay

## 2023-12-25 DIAGNOSIS — E669 Obesity, unspecified: Secondary | ICD-10-CM

## 2023-12-25 DIAGNOSIS — R632 Polyphagia: Secondary | ICD-10-CM

## 2023-12-25 DIAGNOSIS — E559 Vitamin D deficiency, unspecified: Secondary | ICD-10-CM | POA: Diagnosis not present

## 2023-12-25 DIAGNOSIS — Z6837 Body mass index (BMI) 37.0-37.9, adult: Secondary | ICD-10-CM | POA: Diagnosis not present

## 2023-12-25 DIAGNOSIS — E88819 Insulin resistance, unspecified: Secondary | ICD-10-CM | POA: Diagnosis not present

## 2023-12-25 DIAGNOSIS — E66812 Obesity, class 2: Secondary | ICD-10-CM

## 2023-12-25 MED ORDER — VITAMIN D (ERGOCALCIFEROL) 1.25 MG (50000 UNIT) PO CAPS
50000.0000 [IU] | ORAL_CAPSULE | ORAL | 0 refills | Status: DC
  Filled 2023-12-25: qty 12, 84d supply, fill #0

## 2023-12-25 MED ORDER — PHENTERMINE HCL 15 MG PO CAPS
15.0000 mg | ORAL_CAPSULE | ORAL | 0 refills | Status: DC
  Filled 2023-12-25: qty 30, 30d supply, fill #0

## 2023-12-25 MED ORDER — TOPIRAMATE 50 MG PO TABS
50.0000 mg | ORAL_TABLET | Freq: Every day | ORAL | 0 refills | Status: DC
  Filled 2023-12-25: qty 60, 60d supply, fill #0

## 2023-12-25 NOTE — Progress Notes (Addendum)
 WEIGHT SUMMARY AND BIOMETRICS  No data recorded No data recorded No data recorded Other Clinical Data Comments: video visit    Chief Complaint:  I connected with  Rhonda [name] on 12/25/23 by a video and audio enabled telemedicine application and verified that I am speaking with the correct person using two identifiers.  Patient Location: Other:  [facility]  Provider Location: Office/Clinic  I discussed the limitations of evaluation and management by telemedicine. The patient expressed understanding and agreed to proceed.  OBESITY Rhonda [name] is here to discuss her progress with her obesity treatment plan. She is on the Cat 3 MP and states she is following her eating plan approximately 50 % of the time.  She states she is exercising Strength Training 15 minutes 1 times per week.  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 In order to keep her on track with healthy eating and medication regime- MyChart Video Visit was utilized.  Rhonda [name] denies increased blood pressure or worsening anxiety. She has been eating out more due to demanding work schedule. Last week she worked an estimated 70 hours.  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 She hopes finances will improve after child support payments will be halted to her ex-husband.  Rhonda [name] will move to [place] with her children in August.  Subjective:   1. Insulin  resistance  Latest Reference Range & Units 04/02/22 11:04 10/04/22 09:04 01/24/23 09:39 09/02/23 09:13  INSULIN  2.6 - 24.9 uIU/mL 17.1 10.8 8.8 11.7   She is not currently on any antidiabetic medications. She endorses more eating out the last several weeks.  2. Vitamin D  deficiency  Latest Reference Range & Units 04/02/22 11:04 10/04/22 09:04 01/24/23 09:39 09/02/23 09:13  Vitamin D , 25-Hydroxy 30.0 - 100.0 ng/mL 49.5 38.5 45.4 35.3   She is on  weekly Ergocalciferol   3. Polyphagia PDMP Reviewed- no aberrances noted. 08/05/2023: Controlled Contract signed and she was started on Lomaira  and Topamax  Starting Weight 262 lbs She is currently on daily Phentermine  15mg  and Topamax  50mg  She denies increase in BP, worsening anxiety, or insomnia. Last recorded weight at [facility] was 258 lbs on 11/05/2023 She will need to have achieved a 5% weight loss at next OV to remain on Phentermine   That equates to weight of 248 lbs  Assessment/Plan:   1. Insulin  resistance Increase protein and limit sugar/simple CHO Increase daily activity  2. Vitamin D  deficiency (Primary) Refill Vitamin D , Ergocalciferol , (DRISDOL ) 1.25 MG (50000 UNIT) CAPS capsule Take 1 capsule (50,000 Units total) by mouth every 7 (seven) days. Dispense: 12 capsule, Refills: 0 of 0 remaining   3. Polyphagia Refill  phentermine  15 MG capsule Take 1 capsule (15 mg total) by mouth every morning. Dispense: 30 capsule, Refills: 0 of 0 remaining  Refill  topiramate  (TOPAMAX ) 50 MG tablet Take 1 tablet (50 mg total) by mouth daily. Dispense: 60 tablet, Refills: 0 of 0 remaining   4. Obesity, current BMI 37.1  Rhonda [name] is currently in the action stage of change. As such, her goal is to continue with weight loss efforts. She has agreed to Cat 3 MP.   Exercise goals: For substantial health benefits, adults should do at least 150 minutes (2 hours and 30 minutes) a week of moderate-intensity, or 75 minutes (1 hour and 15 minutes) a week of vigorous-intensity aerobic physical activity, or an equivalent combination of moderate-  and vigorous-intensity aerobic activity. Aerobic activity should be performed in episodes of at least 10 minutes, and preferably, it should be spread throughout the week.  Behavioral modification strategies: increasing lean protein intake, decreasing simple carbohydrates, increasing vegetables, increasing water intake, decreasing eating out, no skipping meals, meal  planning and cooking strategies, keeping healthy foods in the home, ways to avoid boredom eating, and planning for success.  Rhonda [name] has agreed to follow-up with our clinic in 4 weeks. She was informed of the importance of frequent follow-up visits to maximize her success with intensive lifestyle modifications for her multiple health conditions.   Objective:   There were no vitals taken for this visit. There is no height or weight on file to calculate BMI.  General: Cooperative, alert, well developed, in no acute distress. HEENT: Conjunctivae and lids unremarkable. Cardiovascular: Regular rhythm.  Lungs: Normal work of breathing. Neurologic: No focal deficits.   Lab Results  Component Value Date   CREATININE 0.89 09/02/2023   BUN 9 09/02/2023   NA 138 09/02/2023   K 4.4 09/02/2023   CL 105 09/02/2023   CO2 19 (L) 09/02/2023   Lab Results  Component Value Date   ALT 27 09/02/2023   AST 23 09/02/2023   ALKPHOS 88 09/02/2023   BILITOT 0.5 09/02/2023   Lab Results  Component Value Date   HGBA1C 5.3 09/02/2023   HGBA1C 5.0 01/24/2023   HGBA1C 5.0 10/04/2022   HGBA1C 4.9 04/02/2022   HGBA1C 5.0 08/23/2021   Lab Results  Component Value Date   INSULIN  11.7 09/02/2023   INSULIN  8.8 01/24/2023   INSULIN  10.8 10/04/2022   INSULIN  17.1 04/02/2022   INSULIN  7.0 08/23/2021   Lab Results  Component Value Date   TSH 2.170 10/07/2023   Lab Results  Component Value Date   CHOL 174 10/07/2023   HDL 41 10/07/2023   LDLCALC 113 (H) 10/07/2023   TRIG 112 10/07/2023   CHOLHDL 4.2 10/07/2023   Lab Results  Component Value Date   VD25OH 35.3 09/02/2023   VD25OH 45.4 01/24/2023   VD25OH 38.5 10/04/2022   Lab Results  Component Value Date   WBC 4.7 01/09/2018   HGB 14.5 01/09/2018   HCT 42.1 01/09/2018   MCV 89 01/09/2018   PLT 195 09/20/2016   No results found for: IRON, TIBC, FERRITIN  Attestation Statements:   Reviewed by clinician on day of visit:  allergies, medications, problem list, medical history, surgical history, family history, social history, and previous encounter notes.  I have reviewed the above documentation for accuracy and completeness, and I agree with the above. -  Thanos Cousineau d. Cristine Daw, NP-C

## 2024-01-19 ENCOUNTER — Other Ambulatory Visit: Payer: Self-pay | Admitting: Psychiatry

## 2024-01-19 DIAGNOSIS — F5105 Insomnia due to other mental disorder: Secondary | ICD-10-CM

## 2024-01-20 ENCOUNTER — Other Ambulatory Visit: Payer: Self-pay

## 2024-01-20 MED ORDER — ZOLPIDEM TARTRATE ER 6.25 MG PO TBCR
6.2500 mg | EXTENDED_RELEASE_TABLET | Freq: Every evening | ORAL | 2 refills | Status: DC | PRN
  Filled 2024-01-20: qty 30, 30d supply, fill #0
  Filled 2024-02-24: qty 30, 30d supply, fill #1
  Filled 2024-04-01 (×2): qty 30, 30d supply, fill #2

## 2024-01-22 DIAGNOSIS — Z630 Problems in relationship with spouse or partner: Secondary | ICD-10-CM | POA: Diagnosis not present

## 2024-01-29 ENCOUNTER — Encounter (INDEPENDENT_AMBULATORY_CARE_PROVIDER_SITE_OTHER): Payer: Self-pay | Admitting: Adult Health

## 2024-01-29 ENCOUNTER — Ambulatory Visit (INDEPENDENT_AMBULATORY_CARE_PROVIDER_SITE_OTHER): Admitting: Adult Health

## 2024-01-29 ENCOUNTER — Other Ambulatory Visit: Payer: Self-pay

## 2024-01-29 VITALS — BP 119/82 | HR 92 | Temp 97.7°F | Ht 70.0 in | Wt 265.0 lb

## 2024-01-29 DIAGNOSIS — R632 Polyphagia: Secondary | ICD-10-CM

## 2024-01-29 DIAGNOSIS — E669 Obesity, unspecified: Secondary | ICD-10-CM

## 2024-01-29 DIAGNOSIS — Z87898 Personal history of other specified conditions: Secondary | ICD-10-CM | POA: Diagnosis not present

## 2024-01-29 DIAGNOSIS — F909 Attention-deficit hyperactivity disorder, unspecified type: Secondary | ICD-10-CM | POA: Diagnosis not present

## 2024-01-29 DIAGNOSIS — Z6837 Body mass index (BMI) 37.0-37.9, adult: Secondary | ICD-10-CM | POA: Diagnosis not present

## 2024-01-29 DIAGNOSIS — E66812 Obesity, class 2: Secondary | ICD-10-CM

## 2024-01-29 DIAGNOSIS — E88819 Insulin resistance, unspecified: Secondary | ICD-10-CM

## 2024-01-29 MED ORDER — VITAMIN D (ERGOCALCIFEROL) 1.25 MG (50000 UNIT) PO CAPS
50000.0000 [IU] | ORAL_CAPSULE | ORAL | 0 refills | Status: DC
  Filled 2024-01-29: qty 12, 84d supply, fill #0

## 2024-01-29 MED ORDER — TOPIRAMATE 100 MG PO TABS
100.0000 mg | ORAL_TABLET | Freq: Every day | ORAL | 0 refills | Status: DC
  Filled 2024-01-29: qty 60, 60d supply, fill #0

## 2024-01-29 NOTE — Progress Notes (Signed)
 WEIGHT SUMMARY AND BIOMETRICS  Vitals Temp: 97.7 F (36.5 C) BP: 119/82 Pulse Rate: 92 SpO2: 97 %   Anthropometric Measurements Height: 5' 10 (1.778 m) Weight: 265 lb (120.2 kg) BMI (Calculated): 38.02 Weight at Last Visit: 258 lb Weight Lost Since Last Visit: 0 Weight Gained Since Last Visit: 7 lb Starting Weight: 273 lb Total Weight Loss (lbs): 8 lb (3.629 kg)   Body Composition  Body Fat %: 43.3 % Fat Mass (lbs): 114.8 lbs Muscle Mass (lbs): 142.8 lbs Total Body Water (lbs): 102.4 lbs Visceral Fat Rating : 11   Other Clinical Data Fasting: no Labs: no Today's Visit #: 34 Starting Date: 01/09/18    Chief Complaint:   OBESITY Rhonda [name] is here to discuss her progress with her obesity treatment plan.  She is on the the Category 3 Plan and states she is following her eating plan approximately 50 % of the time.  She states she is exercising: NEAT Activities  Interim History:  Rhonda [name] continues to work either FT or FT plus PT- alternating every other week. She will not be required to pay her ex-husband child support after June 2025.  She and her ex-husband [name] Creighton) have recently started couples therapy in hopes of improving their co-parenting skills.  Hunger/appetite-she endorses poor appetite control  Subjective:   1. Insulin  resistance  Latest Reference Range & Units 10/04/22 09:04 01/24/23 09:39 09/02/23 09:13  INSULIN  2.6 - 24.9 uIU/mL 10.8 8.8 11.7    Metformin  was ineffective Current insurance will not cover GLP-1 or GIP/GLP-1 therapy  2. History of snoring Rhonda [name] reports that her boyfriend and several friends have recently shared that her snoring is becoming louder and more persistent. She endorses  daytime drowsiness and will often nap in late afternoon 1-2 hours. She has never been evaluated for OSA- she is agreeable to referral  3. Polyphagia 08/05/23 [facility] OV with Dr. Murray Arnold - Primary     Medication options were  discussed extensively with patient today.  Given availability, cost, insurance coverage and other medical comorbidities the decision was reached to start medications Lomaira  and Topiramate .  These medications will be used as a substitute for brand name Qsymia in doses that are synanomous.  Patient understands this is an off label usage.  We discussed the titration schedule with the goal of 5% weight loss at 3 months at a treatment dose.  The first two weeks will be a starting dose of 25mg  of Topiramate  and 4mg  of Lomaira .  After two weeks the patient will increase to 50mg  of Topiramate  and 8mg  of Lomaira  and will stay on this dose until the next appointment.  Controlled substance contract was discussed and signed today.  Prescriptions sent in. PDMP checked with no concerns noted.  Patient is on Vyvanse  for ADHD and we discussed possibility of side effects from additional stimulant.  She will mychart if she develops any side effects.          Relevant Medications    Phentermine  HCl (LOMAIRA ) 8 MG TABS    topiramate  (TOPAMAX ) 25 MG tablet   She gained 7 lbs since last OV She is currently nightly Topamax  and daily Phentermine  15mg  She reports persistent food noise She doesn't believe that the Phentermine  is helping with appetite control and subsequent weight loss. Discussed increasing Topamax  dose from 50mg  to100mg  to reduce cravings. Birth Control- Mirena  IUD  4. Attention deficit hyperactivity disorder (ADHD), unspecified ADHD type She is closely followed by Behavioral Health- consistent  OV noted in EPIC PDMP Reviewed- no aberrancies noted She denies use of Adderall worsening pre-existing anxiety or insomnia  Assessment/Plan:   1. Insulin  resistance (Primary) Increase protein, limit sugar/simple CHO Increase regular exercise  2. History of snoring Referral   3. Polyphagia Stop Phenetermine Refill and INCREASE topiramate  (TOPAMAX ) 100 MG tablet Take 1 tablet (100 mg total) by mouth  daily. Dispense: 60 tablet, Refills: 0 of 0 remaining   4. Attention deficit hyperactivity disorder (ADHD), unspecified ADHD type Increase regular exercise Continue stimulant therapy per mental health care provider  5. Obesity, current BMI 37.1  Rhonda [name] is currently in the action stage of change. As such, her goal is to continue with weight loss efforts. She has agreed to the Category 3 Plan.   Exercise goals: All adults should avoid inactivity. Some physical activity is better than none, and adults who participate in any amount of physical activity gain some health benefits. Adults should also include muscle-strengthening activities that involve all major muscle groups on 2 or more days a week.  Behavioral modification strategies: increasing lean protein intake, decreasing simple carbohydrates, increasing vegetables, increasing water intake, decreasing eating out, no skipping meals, meal planning and cooking strategies, keeping healthy foods in the home, and planning for success.  Rhonda [name] has agreed to follow-up with our clinic in 4 weeks. She was informed of the importance of frequent follow-up visits to maximize her success with intensive lifestyle modifications for her multiple health conditions.   Check Labs at next OV  Objective:   Blood pressure 119/82, pulse 92, temperature 97.7 F (36.5 C), height 5' 10 (1.778 m), weight 265 lb (120.2 kg), SpO2 97%. Body mass index is 38.02 kg/m.  General: Cooperative, alert, well developed, in no acute distress. HEENT: Conjunctivae and lids unremarkable. Cardiovascular: Regular rhythm.  Lungs: Normal work of breathing. Neurologic: No focal deficits.   Lab Results  Component Value Date   CREATININE 0.89 09/02/2023   BUN 9 09/02/2023   NA 138 09/02/2023   K 4.4 09/02/2023   CL 105 09/02/2023   CO2 19 (L) 09/02/2023   Lab Results  Component Value Date   ALT 27 09/02/2023   AST 23 09/02/2023   ALKPHOS 88 09/02/2023   BILITOT 0.5  09/02/2023   Lab Results  Component Value Date   HGBA1C 5.3 09/02/2023   HGBA1C 5.0 01/24/2023   HGBA1C 5.0 10/04/2022   HGBA1C 4.9 04/02/2022   HGBA1C 5.0 08/23/2021   Lab Results  Component Value Date   INSULIN  11.7 09/02/2023   INSULIN  8.8 01/24/2023   INSULIN  10.8 10/04/2022   INSULIN  17.1 04/02/2022   INSULIN  7.0 08/23/2021   Lab Results  Component Value Date   TSH 2.170 10/07/2023   Lab Results  Component Value Date   CHOL 174 10/07/2023   HDL 41 10/07/2023   LDLCALC 113 (H) 10/07/2023   TRIG 112 10/07/2023   CHOLHDL 4.2 10/07/2023   Lab Results  Component Value Date   VD25OH 35.3 09/02/2023   VD25OH 45.4 01/24/2023   VD25OH 38.5 10/04/2022   Lab Results  Component Value Date   WBC 4.7 01/09/2018   HGB 14.5 01/09/2018   HCT 42.1 01/09/2018   MCV 89 01/09/2018   PLT 195 09/20/2016   No results found for: IRON, TIBC, FERRITIN  Attestation Statements:   Reviewed by clinician on day of visit: allergies, medications, problem list, medical history, surgical history, family history, social history, and previous encounter notes.  I have reviewed the above documentation for  accuracy and completeness, and I agree with the above. -  Hutson Luft d. Laurence Folz, NP-C

## 2024-01-30 ENCOUNTER — Encounter: Payer: Self-pay | Admitting: Sleep Medicine

## 2024-01-30 ENCOUNTER — Ambulatory Visit: Admitting: Sleep Medicine

## 2024-01-30 VITALS — BP 116/80 | HR 69 | Temp 97.8°F | Ht 70.0 in | Wt 266.8 lb

## 2024-01-30 DIAGNOSIS — F419 Anxiety disorder, unspecified: Secondary | ICD-10-CM

## 2024-01-30 DIAGNOSIS — F5104 Psychophysiologic insomnia: Secondary | ICD-10-CM

## 2024-01-30 DIAGNOSIS — Z6838 Body mass index (BMI) 38.0-38.9, adult: Secondary | ICD-10-CM | POA: Diagnosis not present

## 2024-01-30 DIAGNOSIS — E66812 Obesity, class 2: Secondary | ICD-10-CM | POA: Diagnosis not present

## 2024-01-30 DIAGNOSIS — G4700 Insomnia, unspecified: Secondary | ICD-10-CM

## 2024-01-30 DIAGNOSIS — G4733 Obstructive sleep apnea (adult) (pediatric): Secondary | ICD-10-CM | POA: Diagnosis not present

## 2024-01-30 NOTE — Progress Notes (Signed)
 Name:Rhonda [name] MRN: [id number] DOB: 03/10/1986   CHIEF COMPLAINT:  EXCESSIVE DAYTIME SLEEPINESS   HISTORY OF PRESENT ILLNESS:  [name] is a 38 y.o. w/ a h/o ADD, anxiety, depression and obesity who presents for c/o loud snoring and excessive daytime sleepiness which has been present for several months. Reports nocturnal awakenings due to unclear reasons, however does not have difficulty falling back to sleep. Reports a 30 lb weight gain over the last year. Admits to night sweats and occasional headaches. Denies RLS symptoms, dream enactment, cataplexy, hypnagogic or hypnapompic hallucinations. Denies a family history of sleep apnea. Denies drowsy driving. Drinks 2-3 cups of coffee daily and 1 soda a few times per week, occasional alcohol use, denies tobacco or illicit drug use.   Bedtime 10-11 pm Sleep onset 30 mins with Ambien  Rise time 6-7 am   EPWORTH SLEEP SCORE 7    01/30/2024   10:00 AM  Results of the Epworth flowsheet  Sitting and reading 1  Watching TV 1  Sitting, inactive in a public place (e.g. a theatre or a meeting) 0  As a passenger in a car for an hour without a break 2  Lying down to rest in the afternoon when circumstances permit 3  Sitting and talking to someone 0  Sitting quietly after a lunch without alcohol 0  In a car, while stopped for a few minutes in traffic 0  Total score 7    PAST MEDICAL HISTORY :   has a past medical history of Depression, PCOS (polycystic ovarian syndrome) (2008), and Plantar fasciitis.  has a past surgical history that includes Cholecystectomy. Prior to Admission medications   Medication Sig Start Date End Date Taking? Authorizing Provider  buPROPion  ER (WELLBUTRIN  SR) 100 MG 12 hr tablet Take 100 mg by mouth. 06/18/23  Yes [provider]  busPIRone  (BUSPAR ) 15 MG tablet Take 1 tablet (15 mg total) by mouth 2 (two) times daily. 10/01/23  Yes Eappen, Saramma, MD  ELDERBERRY PO Take by mouth.   Yes  [provider]  hydrOXYzine  (VISTARIL ) 25 MG capsule Take 1-2 capsules (25-50 mg total) by mouth at bedtime as needed for sleep 12/12/22  Yes Eappen, Saramma, MD  levocetirizine (XYZAL) 5 MG tablet Take 5 mg by mouth every evening.   Yes [provider]  levonorgestrel  (MIRENA ) 20 MCG/24HR IUD 1 each by Intrauterine route once.   Yes [provider]  Multiple Vitamin (MULTIVITAMIN) tablet Take 1 tablet by mouth daily.   Yes [provider]  nystatin  cream (MYCOSTATIN ) Apply 1 Application topically 2 (two) times daily. 07/08/23  Yes Angelia Kelp, PA-C  Probiotic Product (PROBIOTIC-10 PO) Take by mouth.   Yes [provider]  propranolol  (INDERAL ) 20 MG tablet Take 1 tablet (20 mg total) by mouth 2 (two) times daily as needed 02/26/22  Yes   sertraline  (ZOLOFT ) 50 MG tablet Take 1 tablet (50 mg total) by mouth daily. 11/05/23  Yes Danford, Acie Holiday D, NP  spironolactone  (ALDACTONE ) 100 MG tablet Take 1 tablet (100 mg total) by mouth daily. 06/19/23  Yes   topiramate  (TOPAMAX ) 100 MG tablet Take 1 tablet (100 mg total) by mouth daily. 01/29/24  Yes Danford, Acie Holiday D, NP  triamcinolone  cream (KENALOG ) 0.1 % Apply 1 Application topically 2 (two) times daily. 09/24/23  Yes Farris Hong, PA-C  Vitamin D , Ergocalciferol , (DRISDOL ) 1.25 MG (50000 UNIT) CAPS capsule Take 1 capsule (50,000 Units total) by mouth every 7 (seven)  days. 01/29/24  Yes Danford, Acie Holiday D, NP  zolpidem  (AMBIEN  CR) 6.25 MG CR tablet Take 1 tablet (6.25 mg total) by mouth at bedtime as needed for sleep. 01/20/24 04/19/24 Yes Eappen, Saramma, MD  amphetamine -dextroamphetamine  (ADDERALL) 10 MG tablet Take 1 tablet (10 mg total) by mouth 2 (two) times daily. 12/22/23 01/24/24  Eappen, Saramma, MD  fluconazole  (DIFLUCAN ) 150 MG tablet Take 1 tablet (150 mg total) by mouth every 3 (three) days as needed. Patient not taking: Reported on 01/30/2024 10/18/23   Angelia Kelp, PA-C  nitrofurantoin ,  macrocrystal-monohydrate, (MACROBID ) 100 MG capsule Take 1 capsule (100 mg total) by mouth 2 (two) times daily. Patient not taking: Reported on 01/30/2024 11/20/23   Angelia Kelp, PA-C   No Known Allergies  FAMILY HISTORY:  family history includes Alcohol abuse in her father; Alcoholism in her father; Bipolar disorder in her maternal aunt; Depression in her father; Diabetes in her paternal grandmother; High blood pressure in her mother; Liver disease in her father. SOCIAL HISTORY:  reports that she has never smoked. She has never used smokeless tobacco. She reports current alcohol use of about 1.0 standard drink of alcohol per week. She reports that she does not use drugs.   Review of Systems:  Gen:  Denies  fever, sweats, chills weight loss  HEENT: Denies blurred vision, double vision, ear pain, eye pain, hearing loss, nose bleeds, sore throat Cardiac:  No dizziness, chest pain or heaviness, chest tightness,edema, No JVD Resp:   No cough, -sputum production, -shortness of breath,-wheezing, -hemoptysis,  Gi: Denies swallowing difficulty, stomach pain, nausea or vomiting, diarrhea, constipation, bowel incontinence Gu:  Denies bladder incontinence, burning urine Ext:   Denies Joint pain, stiffness or swelling Skin: Denies  skin rash, easy bruising or bleeding or hives Endoc:  Denies polyuria, polydipsia , polyphagia or weight change Psych:   Denies depression, insomnia or hallucinations  Other:  All other systems negative  VITAL SIGNS: BP 116/80 (BP Location: Left Arm, Patient Position: Sitting, Cuff Size: Large)   Pulse 69   Temp 97.8 F (36.6 C) (Oral)   Ht 5' 10 (1.778 m)   Wt 266 lb 12.8 oz (121 kg)   SpO2 96%   BMI 38.28 kg/m    Physical Examination:   General Appearance: No distress  EYES PERRLA, EOM intact.   NECK Supple, No JVD Pulmonary: normal breath sounds, No wheezing.  CardiovascularNormal S1,S2.  No m/r/g.   Abdomen: Benign, Soft, non-tender. Skin:    warm, no rashes, no ecchymosis  Extremities: normal, no cyanosis, clubbing. Neuro:without focal findings,  speech normal  PSYCHIATRIC: Mood, affect within normal limits.   ASSESSMENT AND PLAN  OSA I suspect that OSA is likely present due to clinical presentation. Discussed the consequences of untreated sleep apnea. Advised not to drive drowsy for safety of patient and others. Will complete further evaluation with a home sleep study and follow up to review results.    Anxiety Stable, on current management. Following with PCP.   Obesity Counseled patient on diet and lifestyle modification.   Insomnia Counseled patient on stimulus control and improving sleep hygiene practices.    MEDICATION ADJUSTMENTS/LABS AND TESTS ORDERED: Recommend Sleep Study   Patient  satisfied with Plan of action and management. All questions answered  Follow up to review HST results and treatment plan.   I spent a total of 45 minutes reviewing chart data, face-to-face evaluation with the patient, counseling and coordination of care as detailed above.    Shina Wass  Kieran Pellet, M.D.  [facility]

## 2024-01-30 NOTE — Patient Instructions (Signed)
 Rhonda [name]

## 2024-02-05 DIAGNOSIS — Z630 Problems in relationship with spouse or partner: Secondary | ICD-10-CM | POA: Diagnosis not present

## 2024-02-15 ENCOUNTER — Encounter

## 2024-02-15 DIAGNOSIS — G4733 Obstructive sleep apnea (adult) (pediatric): Secondary | ICD-10-CM

## 2024-02-17 ENCOUNTER — Other Ambulatory Visit: Payer: Self-pay

## 2024-02-20 DIAGNOSIS — Z630 Problems in relationship with spouse or partner: Secondary | ICD-10-CM | POA: Diagnosis not present

## 2024-02-24 ENCOUNTER — Other Ambulatory Visit: Payer: Self-pay

## 2024-02-26 DIAGNOSIS — R0683 Snoring: Secondary | ICD-10-CM | POA: Diagnosis not present

## 2024-02-27 ENCOUNTER — Ambulatory Visit: Payer: Self-pay

## 2024-02-27 DIAGNOSIS — E66812 Obesity, class 2: Secondary | ICD-10-CM

## 2024-02-27 DIAGNOSIS — G4733 Obstructive sleep apnea (adult) (pediatric): Secondary | ICD-10-CM

## 2024-02-27 DIAGNOSIS — F5104 Psychophysiologic insomnia: Secondary | ICD-10-CM

## 2024-03-03 DIAGNOSIS — Z630 Problems in relationship with spouse or partner: Secondary | ICD-10-CM | POA: Diagnosis not present

## 2024-03-10 ENCOUNTER — Telehealth: Payer: Self-pay

## 2024-03-10 ENCOUNTER — Encounter: Payer: Self-pay | Admitting: Sleep Medicine

## 2024-03-10 ENCOUNTER — Ambulatory Visit (INDEPENDENT_AMBULATORY_CARE_PROVIDER_SITE_OTHER): Admitting: Sleep Medicine

## 2024-03-10 ENCOUNTER — Other Ambulatory Visit: Payer: Self-pay

## 2024-03-10 VITALS — BP 100/60 | HR 64 | Temp 98.1°F | Ht 70.0 in | Wt 274.0 lb

## 2024-03-10 DIAGNOSIS — G4733 Obstructive sleep apnea (adult) (pediatric): Secondary | ICD-10-CM

## 2024-03-10 DIAGNOSIS — E669 Obesity, unspecified: Secondary | ICD-10-CM | POA: Diagnosis not present

## 2024-03-10 DIAGNOSIS — Z6839 Body mass index (BMI) 39.0-39.9, adult: Secondary | ICD-10-CM | POA: Diagnosis not present

## 2024-03-10 MED ORDER — ZEPBOUND 2.5 MG/0.5ML ~~LOC~~ SOAJ
2.5000 mg | SUBCUTANEOUS | 1 refills | Status: DC
  Filled 2024-03-10: qty 2, 28d supply, fill #0

## 2024-03-10 NOTE — Patient Instructions (Addendum)

## 2024-03-10 NOTE — Progress Notes (Signed)
 Name:Rhonda [name] MRN: [id number] DOB: 1986/08/16   CHIEF COMPLAINT:  HST F/U   HISTORY OF PRESENT ILLNESS:  Rhonda [name] is a 38 y.o. w/ a h/o ADD, anxiety, depression and obesity who presents to follow up on HST results. The patient underwent HST which revealed mild OSA (AHI 6, O2 nadir 88%).   Also reports wanting to try alternative treatment options for weight loss. Has tried diet and lifestyle modifications without significant improvement. Denies a family or personal history of thyroid  medullary CA or pancreatitis.     EPWORTH SLEEP SCORE    01/30/2024   10:00 AM  Results of the Epworth flowsheet  Sitting and reading 1  Watching TV 1  Sitting, inactive in a public place (e.g. a theatre or a meeting) 0  As a passenger in a car for an hour without a break 2  Lying down to rest in the afternoon when circumstances permit 3  Sitting and talking to someone 0  Sitting quietly after a lunch without alcohol 0  In a car, while stopped for a few minutes in traffic 0  Total score 7    PAST MEDICAL HISTORY :   has a past medical history of Depression, PCOS (polycystic ovarian syndrome) (2008), and Plantar fasciitis.  has a past surgical history that includes Cholecystectomy. Prior to Admission medications   Medication Sig Start Date End Date Taking? Authorizing Provider  amphetamine -dextroamphetamine  (ADDERALL) 10 MG tablet Take 1 tablet (10 mg total) by mouth 2 (two) times daily. 12/22/23 03/10/24 Yes Eappen, Saramma, MD  busPIRone  (BUSPAR ) 15 MG tablet Take 1 tablet (15 mg total) by mouth 2 (two) times daily. 10/01/23  Yes Eappen, Saramma, MD  hydrOXYzine  (VISTARIL ) 25 MG capsule Take 1-2 capsules (25-50 mg total) by mouth at bedtime as needed for sleep 12/12/22  Yes Eappen, Saramma, MD  levocetirizine (XYZAL) 5 MG tablet Take 5 mg by mouth every evening.   Yes [provider]  levonorgestrel  (MIRENA ) 20 MCG/24HR IUD 1 each by Intrauterine route once.   Yes  [provider]  Multiple Vitamin (MULTIVITAMIN) tablet Take 1 tablet by mouth daily.   Yes [provider]  Probiotic Product (PROBIOTIC-10 PO) Take by mouth.   Yes [provider]  propranolol  (INDERAL ) 20 MG tablet Take 1 tablet (20 mg total) by mouth 2 (two) times daily as needed 02/26/22  Yes   sertraline  (ZOLOFT ) 50 MG tablet Take 1 tablet (50 mg total) by mouth daily. 11/05/23  Yes Danford, Rockie D, NP  spironolactone  (ALDACTONE ) 100 MG tablet Take 1 tablet (100 mg total) by mouth daily. 06/19/23  Yes   topiramate  (TOPAMAX ) 100 MG tablet Take 1 tablet (100 mg total) by mouth daily. 01/29/24  Yes Danford, Katy D, NP  Vitamin D , Ergocalciferol , (DRISDOL ) 1.25 MG (50000 UNIT) CAPS capsule Take 1 capsule (50,000 Units total) by mouth every 7 (seven) days. 01/29/24  Yes Danford, Rockie D, NP  zolpidem  (AMBIEN  CR) 6.25 MG CR tablet Take 1 tablet (6.25 mg total) by mouth at bedtime as needed for sleep. 01/20/24 04/19/24 Yes Eappen, Saramma, MD  buPROPion  ER (WELLBUTRIN  SR) 100 MG 12 hr tablet Take 100 mg by mouth. 06/18/23   [provider]  ELDERBERRY PO Take by mouth.    [provider]  fluconazole  (DIFLUCAN ) 150 MG tablet Take 1 tablet (150 mg total) by mouth every 3 (three) days as needed. Patient not taking: Reported on 01/30/2024 10/18/23   Vivienne Delon [name], PA-C  nitrofurantoin , macrocrystal-monohydrate, (MACROBID ) 100 MG capsule Take 1 capsule (100 mg total) by mouth 2 (two) times daily. Patient not taking: Reported on 01/30/2024 11/20/23   Burnette, Jennifer M, PA-C  nystatin  cream (MYCOSTATIN ) Apply 1 Application topically 2 (two) times daily. 07/08/23   Vivienne Delon [name], PA-C  triamcinolone  cream (KENALOG ) 0.1 % Apply 1 Application topically 2 (two) times daily. 09/24/23   Gladis Elsie BROCKS, PA-C   No Known Allergies  FAMILY HISTORY:  family history includes Alcohol abuse in her father; Alcoholism in her father; Bipolar disorder in her maternal  aunt; Depression in her father; Diabetes in her paternal grandmother; High blood pressure in her mother; Liver disease in her father. SOCIAL HISTORY:  reports that she has never smoked. She has never used smokeless tobacco. She reports current alcohol use of about 1.0 standard drink of alcohol per week. She reports that she does not use drugs.   Review of Systems:  Gen:  Denies  fever, sweats, chills weight loss  HEENT: Denies blurred vision, double vision, ear pain, eye pain, hearing loss, nose bleeds, sore throat Cardiac:  No dizziness, chest pain or heaviness, chest tightness,edema, No JVD Resp:   No cough, -sputum production, -shortness of breath,-wheezing, -hemoptysis,  Gi: Denies swallowing difficulty, stomach pain, nausea or vomiting, diarrhea, constipation, bowel incontinence Gu:  Denies bladder incontinence, burning urine Ext:   Denies Joint pain, stiffness or swelling Skin: Denies  skin rash, easy bruising or bleeding or hives Endoc:  Denies polyuria, polydipsia , polyphagia or weight change Psych:   Denies depression, insomnia or hallucinations  Other:  All other systems negative  VITAL SIGNS: BP 100/60 (BP Location: Right Arm, Patient Position: Sitting, Cuff Size: Normal)   Pulse 64   Temp 98.1 F (36.7 C) (Oral)   Ht 5' 10 (1.778 m)   Wt 274 lb (124.3 kg)   SpO2 97%   BMI 39.31 kg/m     Physical Examination:   General Appearance: No distress  EYES PERRLA, EOM intact.   NECK Supple, No JVD Pulmonary: normal breath sounds, No wheezing.  CardiovascularNormal S1,S2.  No m/r/g.   Abdomen: Benign, Soft, non-tender. Skin:   warm, no rashes, no ecchymosis  Extremities: normal, no cyanosis, clubbing. Neuro:without focal findings,  speech normal  PSYCHIATRIC: Mood, affect within normal limits.   ASSESSMENT AND PLAN  OSA Reviewed HST results with patient. Starting on APAP therapy set to 4-16 cm H2O. Discussed the consequences of untreated sleep apnea. Advised not to  drive drowsy for safety of patient and others. Will follow up in 3 months.    Obesity Counseled patient on diet and lifestyle modification. Will also try patient on Zepbound  and titrate as tolerated. Patient denies family or personal history of thyroid  medullary CA or pancreatitis.    Patient  satisfied with Plan of action and management. All questions answered  I spent a total of 32 minutes reviewing chart data, face-to-face evaluation with the patient, counseling and coordination of care as detailed above.    Birdie Beveridge, M.D.  Sleep Medicine [place] [facility]

## 2024-03-10 NOTE — Telephone Encounter (Signed)
 This drug/product is not covered under the pharmacy benefit. Prior Authorization is not available.

## 2024-03-10 NOTE — Telephone Encounter (Signed)
*  Pulm  Pharmacy Patient Advocate Encounter   Received notification from CoverMyMeds that prior authorization for Zepbound  2.5MG /0.5ML pen-injectors  is required/requested.   Insurance verification completed.   The patient is insured through [facility] .   Per test claim: PA required; PA started via CoverMyMeds. KEY [id number] . Waiting for clinical questions to populate.

## 2024-03-11 DIAGNOSIS — Z630 Problems in relationship with spouse or partner: Secondary | ICD-10-CM | POA: Diagnosis not present

## 2024-03-16 ENCOUNTER — Encounter (INDEPENDENT_AMBULATORY_CARE_PROVIDER_SITE_OTHER): Payer: Self-pay | Admitting: Adult Health

## 2024-03-17 ENCOUNTER — Ambulatory Visit (INDEPENDENT_AMBULATORY_CARE_PROVIDER_SITE_OTHER): Admitting: Adult Health

## 2024-03-17 ENCOUNTER — Other Ambulatory Visit: Payer: Self-pay

## 2024-03-17 ENCOUNTER — Encounter (INDEPENDENT_AMBULATORY_CARE_PROVIDER_SITE_OTHER): Payer: Self-pay | Admitting: Adult Health

## 2024-03-17 VITALS — BP 116/77 | HR 67 | Temp 98.1°F | Ht 70.0 in | Wt 272.0 lb

## 2024-03-17 DIAGNOSIS — R7989 Other specified abnormal findings of blood chemistry: Secondary | ICD-10-CM | POA: Diagnosis not present

## 2024-03-17 DIAGNOSIS — E559 Vitamin D deficiency, unspecified: Secondary | ICD-10-CM | POA: Diagnosis not present

## 2024-03-17 DIAGNOSIS — E88819 Insulin resistance, unspecified: Secondary | ICD-10-CM

## 2024-03-17 DIAGNOSIS — Z6839 Body mass index (BMI) 39.0-39.9, adult: Secondary | ICD-10-CM

## 2024-03-17 DIAGNOSIS — E66812 Obesity, class 2: Secondary | ICD-10-CM

## 2024-03-17 DIAGNOSIS — Z0000 Encounter for general adult medical examination without abnormal findings: Secondary | ICD-10-CM | POA: Diagnosis not present

## 2024-03-17 DIAGNOSIS — E669 Obesity, unspecified: Secondary | ICD-10-CM

## 2024-03-17 DIAGNOSIS — R632 Polyphagia: Secondary | ICD-10-CM | POA: Diagnosis not present

## 2024-03-17 MED ORDER — VITAMIN D (ERGOCALCIFEROL) 1.25 MG (50000 UNIT) PO CAPS
50000.0000 [IU] | ORAL_CAPSULE | ORAL | 0 refills | Status: DC
  Filled 2024-03-17: qty 12, 84d supply, fill #0

## 2024-03-17 NOTE — Progress Notes (Addendum)
 WEIGHT SUMMARY AND BIOMETRICS  Vitals Temp: 98.1 F (36.7 C) BP: 116/77 Pulse Rate: 67 SpO2: 99 %   Anthropometric Measurements Height: 5' 10 (1.778 m) Weight: 272 lb (123.4 kg) BMI (Calculated): 39.03 Weight at Last Visit: 265 lb Weight Lost Since Last Visit: 0 Weight Gained Since Last Visit: 7 lb Starting Weight: 273 lb Total Weight Loss (lbs): 1 lb (0.454 kg)   Body Composition  Body Fat %: 42.1 % Fat Mass (lbs): 114.6 lbs Muscle Mass (lbs): 149.8 lbs Total Body Water (lbs): 110 lbs Visceral Fat Rating : 11   Other Clinical Data Fasting: yes Labs: yes Today's Visit #: 35 Starting Date: 01/09/18    Chief Complaint:   OBESITY Rhonda [name] is here to discuss her progress with her obesity treatment plan.  She is on the the Category 3 Plan and states she is following her eating plan approximately 50 % of the time.  She states she is exercising: NEAT Activities.  Interim History:  Reviewed Bioimpedance results with pt: Muscle Mass: +7 lbs Adipose Mass: -0.2 lb  Ms. Papandrea endorses the following challenges with weight loss: -Planning meals for week -Meal prepping -Workplace tempations, esp on weekend job, ie: ordering out foo -Cravings -Weekend meal planning/eating out -Differentiating hunger from cravings -Failing to carve out time for exercise -Anger that you have to work so hard to lose weight when others do not  She is frustrated that her health insurance will not cover AOMs, despite her co-morbidities I agree it's frustrating!  Subjective:   1. Polyphagia 01/29/2024 HWW OV- Phentermine  d/c'd- ineffective for weight loss Topamax  increased from 50mg  to 100mg  daily She reports worsening word finding problems and feels that higher dose of Topamax  is not reducing cravings/food noise. Discussed risks/benefits of therapy- mutual agreement to stop Topamax   2. Insulin  resistance  Latest Reference Range & Units 10/04/22 09:04 01/24/23 09:39 09/02/23  09:13  INSULIN  2.6 - 24.9 uIU/mL 10.8 8.8 11.7   Not currently on any antidiabetic therapy Pulmonology recently attempted Zepbound  therapy for newly dx'd OSA- denied by [facility]  3. Elevated serum creatinine BP excellent at OV  Latest Reference Range & Units 10/04/22 09:04 01/24/23 09:39 09/02/23 09:13  Creatinine 0.57 - 1.00 mg/dL 9.05 9.02 9.10   She is not on ACE or ARB therapy  4. Vitamin D  deficiency  Latest Reference Range & Units 10/04/22 09:04 01/24/23 09:39 09/02/23 09:13  Vitamin D , 25-Hydroxy 30.0 - 100.0 ng/mL 38.5 45.4 35.3   She is on weekly Ergocalciferol - denies N/V/Muscle Weakness  5. Healthcare maintenance Recent Sleep Study-  HST revealed mild OSA, recommend proceeding with APAP therapy set to 4-16 cm H2O, EPR 3 with the Airtouch N30i nasal mask.  Please also schedule a 3 month follow up visit if patient wishes to proceed with CPAP therapy.   Pulmonology recently attempted Zepbound  therapy for newly dx'd OSA- denied by [facility]  Assessment/Plan:   1. Polyphagia (Primary) Stop Topamax  Increase plan compliance to least 80% on Cat 3 MP  2. Insulin  resistance Check Labs - Hemoglobin A1c - Insulin , random  3. Elevated serum creatinine Check Labs - Comprehensive metabolic panel with GFR  4. Vitamin D  deficiency Check Labs - VITAMIN D  25 Hydroxy (Vit-D Deficiency, Fractures)  5. Healthcare maintenance Check Labs - Vitamin B12 Start CPAP when able  6. Obesity, Starting BMI 39.17  Aldeen is currently in the action stage of change. As such, her goal is to continue with weight loss efforts. She  has agreed to the Category 3 Plan.   Exercise goals: All adults should avoid inactivity. Some physical activity is better than none, and adults who participate in any amount of physical activity gain some health benefits. Adults should also include muscle-strengthening activities that involve all major muscle groups on 2 or more days a  week.  Behavioral modification strategies: increasing lean protein intake, decreasing simple carbohydrates, increasing vegetables, increasing water intake, no skipping meals, meal planning and cooking strategies, keeping healthy foods in the home, and planning for success.  Corina has agreed to follow-up with our clinic in 4 weeks. She was informed of the importance of frequent follow-up visits to maximize her success with intensive lifestyle modifications for her multiple health conditions.   Arva was informed we would discuss her lab results at her next visit unless there is a critical issue that needs to be addressed sooner. Kerrigan agreed to keep her next visit at the agreed upon time to discuss these results.  Objective:   Blood pressure 116/77, pulse 67, temperature 98.1 F (36.7 C), height 5' 10 (1.778 m), weight 272 lb (123.4 kg), SpO2 99%. Body mass index is 39.03 kg/m.  General: Cooperative, alert, well developed, in no acute distress. HEENT: Conjunctivae and lids unremarkable. Cardiovascular: Regular rhythm.  Lungs: Normal work of breathing. Neurologic: No focal deficits.   Lab Results  Component Value Date   CREATININE 0.89 09/02/2023   BUN 9 09/02/2023   NA 138 09/02/2023   K 4.4 09/02/2023   CL 105 09/02/2023   CO2 19 (L) 09/02/2023   Lab Results  Component Value Date   ALT 27 09/02/2023   AST 23 09/02/2023   ALKPHOS 88 09/02/2023   BILITOT 0.5 09/02/2023   Lab Results  Component Value Date   HGBA1C 5.3 09/02/2023   HGBA1C 5.0 01/24/2023   HGBA1C 5.0 10/04/2022   HGBA1C 4.9 04/02/2022   HGBA1C 5.0 08/23/2021   Lab Results  Component Value Date   INSULIN  11.7 09/02/2023   INSULIN  8.8 01/24/2023   INSULIN  10.8 10/04/2022   INSULIN  17.1 04/02/2022   INSULIN  7.0 08/23/2021   Lab Results  Component Value Date   TSH 2.170 10/07/2023   Lab Results  Component Value Date   CHOL 174 10/07/2023   HDL 41 10/07/2023   LDLCALC 113 (H) 10/07/2023    TRIG 112 10/07/2023   CHOLHDL 4.2 10/07/2023   Lab Results  Component Value Date   VD25OH 35.3 09/02/2023   VD25OH 45.4 01/24/2023   VD25OH 38.5 10/04/2022   Lab Results  Component Value Date   WBC 4.7 01/09/2018   HGB 14.5 01/09/2018   HCT 42.1 01/09/2018   MCV 89 01/09/2018   PLT 195 09/20/2016   No results found for: IRON, TIBC, FERRITIN  Attestation Statements:   Reviewed by clinician on day of visit: allergies, medications, problem list, medical history, surgical history, family history, social history, and previous encounter notes.  I have reviewed the above documentation for accuracy and completeness, and I agree with the above. -  Sheldon Amara d. Malia Corsi, NP-C

## 2024-03-18 LAB — COMPREHENSIVE METABOLIC PANEL WITH GFR
ALT: 36 IU/L — ABNORMAL HIGH (ref 0–32)
AST: 23 IU/L (ref 0–40)
Albumin: 4.5 g/dL (ref 3.9–4.9)
Alkaline Phosphatase: 71 IU/L (ref 44–121)
BUN/Creatinine Ratio: 14 (ref 9–23)
BUN: 10 mg/dL (ref 6–20)
Bilirubin Total: 0.5 mg/dL (ref 0.0–1.2)
CO2: 17 mmol/L — ABNORMAL LOW (ref 20–29)
Calcium: 9.1 mg/dL (ref 8.7–10.2)
Chloride: 105 mmol/L (ref 96–106)
Creatinine, Ser: 0.71 mg/dL (ref 0.57–1.00)
Globulin, Total: 2.2 g/dL (ref 1.5–4.5)
Glucose: 83 mg/dL (ref 70–99)
Potassium: 4.4 mmol/L (ref 3.5–5.2)
Sodium: 138 mmol/L (ref 134–144)
Total Protein: 6.7 g/dL (ref 6.0–8.5)
eGFR: 112 mL/min/1.73 (ref >59)

## 2024-03-18 LAB — INSULIN, RANDOM: INSULIN: 13 u[IU]/mL (ref 2.6–24.9)

## 2024-03-18 LAB — HEMOGLOBIN A1C
Est. average glucose Bld gHb Est-mCnc: 100 mg/dL
Hgb A1c MFr Bld: 5.1 % (ref 4.8–5.6)

## 2024-03-18 LAB — VITAMIN B12: Vitamin B-12: 581 pg/mL (ref 232–1245)

## 2024-03-18 LAB — VITAMIN D 25 HYDROXY (VIT D DEFICIENCY, FRACTURES): Vit D, 25-Hydroxy: 27.7 ng/mL — ABNORMAL LOW (ref 30.0–100.0)

## 2024-03-24 ENCOUNTER — Ambulatory Visit (INDEPENDENT_AMBULATORY_CARE_PROVIDER_SITE_OTHER): Admitting: Licensed Clinical Social Worker

## 2024-03-24 DIAGNOSIS — R41840 Attention and concentration deficit: Secondary | ICD-10-CM

## 2024-03-24 DIAGNOSIS — F419 Anxiety disorder, unspecified: Secondary | ICD-10-CM | POA: Diagnosis not present

## 2024-03-24 NOTE — Progress Notes (Signed)
 [facility] Counselor Initial Adult Exam  Name: Rhonda [name] Date: 03/24/2024 MRN: [id number] DOB: Dec 22, 1985 PCP: Franchot Houston, PA-C  Time Spent: 9:03  am - 9:56 am : 53 Minutes  Guardian/Payee:  Adult/Self    Paperwork requested: No   Reason for Visit /Presenting Problem: recently diagnosed a year ago with ADHD  Mental Status Exam: Appearance:   Neat     Behavior:  Appropriate  Motor:  Normal  Speech/Language:   Normal Rate  Affect:  Appropriate  Mood:  normal  Thought process:  normal  Thought content:    WNL  Sensory/Perceptual disturbances:    WNL  Orientation:  oriented to person, place, and time/date  Attention:  Good  Concentration:  Good  Memory:  WNL  Fund of knowledge:   Good  Insight:    Good  Judgment:   Good  Impulse Control:  Good   Reported Symptoms:  procrastination, time management issues with new job task and ADHD, financial irresponsibility, impulsive, overeating after weight loss and the food noise is in her mind since stopping her weight loss meds, sleep disturbances and apnea will be getting tested and taking meds   Risk Assessment: Danger to Self:  No Self-injurious Behavior: No Danger to Others: No Duty to Warn:no Physical Aggression / Violence:No  Access to Firearms a concern: No  Gang Involvement:No  Patient / guardian was educated about steps to take if suicide or homicide risk level increases between visits: yes While future psychiatric events cannot be accurately predicted, the patient does not currently require acute inpatient psychiatric care and does not currently meet [place]  involuntary commitment criteria.  Substance Abuse History: Current substance abuse: No     Caffeine: Coffee and Soda  Tobacco: Wine and Cocktails  Alcohol: Substance use:  Past Psychiatric History:   Previous psychological history is significant for ADHD, anxiety, and depression Outpatient Providers:Last provider was with 3  years and last session January History of Psych Hospitalization: No  Psychological Testing: Attention/ADHD:  Unknown   Abuse History:  Victim of: No., No   Report needed: No. Victim of Neglect:No. Perpetrator of No   Witness / Exposure to Domestic Violence: No   Protective Services Involvement: No  Witness to [employer] Violence:  No   Family History:  Family History  Problem Relation Age of Onset   High blood pressure Mother    Depression Father    Liver disease Father    Alcoholism Father    Alcohol abuse Father    Diabetes Paternal Grandmother    Bipolar disorder Maternal Aunt     Living situation: the patient lives with their partner  Sexual Orientation: Straight  Relationship Status: co-habitating  Name of spouse / other:Paul If a parent, number of children / ages:Owen 10 boy, Chiquita 7 girl   Support Systems: significant other friends parents Weight Loss group   Financial Stress:  Yes   Income/Employment/Disability: Employment  Financial planner: No   Educational History: Education: post Engineer, maintenance (IT) work or degree  Religion/Sprituality/World View: Christian   Any cultural differences that may affect / interfere with treatment:  not applicable   Recreation/Hobbies: None   Stressors: Financial difficulties   Health problems   Legal issue   Medication change or noncompliance   Occupational concerns   Substance abuse    Strengths: Supportive Relationships, Family, and Friends  Barriers:  Artist History: Pending legal issue / charges: The patient has no significant history of legal issues. History of  legal issue / charges: Custody   Medical History/Surgical History: not reviewed Past Medical History:  Diagnosis Date   Depression    treated   PCOS (polycystic ovarian syndrome) 2008   Plantar fasciitis     Past Surgical History:  Procedure Laterality Date   CHOLECYSTECTOMY      Medications: Current Outpatient Medications   Medication Sig Dispense Refill   amphetamine -dextroamphetamine  (ADDERALL) 10 MG tablet Take 1 tablet (10 mg total) by mouth 2 (two) times daily. 60 tablet 0   buPROPion  ER (WELLBUTRIN  SR) 100 MG 12 hr tablet Take 100 mg by mouth.     busPIRone  (BUSPAR ) 15 MG tablet Take 1 tablet (15 mg total) by mouth 2 (two) times daily. 180 tablet 1   ELDERBERRY PO Take by mouth.     fluconazole  (DIFLUCAN ) 150 MG tablet Take 1 tablet (150 mg total) by mouth every 3 (three) days as needed. (Patient not taking: Reported on 01/30/2024) 2 tablet 0   hydrOXYzine  (VISTARIL ) 25 MG capsule Take 1-2 capsules (25-50 mg total) by mouth at bedtime as needed for sleep 180 capsule 0   levocetirizine (XYZAL) 5 MG tablet Take 5 mg by mouth every evening.     levonorgestrel  (MIRENA ) 20 MCG/24HR IUD 1 each by Intrauterine route once.     Multiple Vitamin (MULTIVITAMIN) tablet Take 1 tablet by mouth daily.     nitrofurantoin , macrocrystal-monohydrate, (MACROBID ) 100 MG capsule Take 1 capsule (100 mg total) by mouth 2 (two) times daily. (Patient not taking: Reported on 01/30/2024) 10 capsule 0   nystatin  cream (MYCOSTATIN ) Apply 1 Application topically 2 (two) times daily. 60 g 0   Probiotic Product (PROBIOTIC-10 PO) Take by mouth.     propranolol  (INDERAL ) 20 MG tablet Take 1 tablet (20 mg total) by mouth 2 (two) times daily as needed 60 tablet 1   sertraline  (ZOLOFT ) 50 MG tablet Take 1 tablet (50 mg total) by mouth daily. 90 tablet 0   spironolactone  (ALDACTONE ) 100 MG tablet Take 1 tablet (100 mg total) by mouth daily. 90 tablet 3   triamcinolone  cream (KENALOG ) 0.1 % Apply 1 Application topically 2 (two) times daily. 30 g 0   Vitamin D , Ergocalciferol , (DRISDOL ) 1.25 MG (50000 UNIT) CAPS capsule Take 1 capsule (50,000 Units total) by mouth every 7 (seven) days. 12 capsule 0   zolpidem  (AMBIEN  CR) 6.25 MG CR tablet Take 1 tablet (6.25 mg total) by mouth at bedtime as needed for sleep. 30 tablet 2   No current  facility-administered medications for this visit.    No Known Allergies  Diagnoses:  Attention and concentration deficit Anxiety Psychiatric Treatment: No , N/A  Plan of Care: Hybrid  Narrative:  Manuelita GORMAN Sacks participated from office with therapist and consented to treatment. We reviewed the limits of confidentiality prior to the start of the evaluation. Demmi Sindt Onofrio expressed understanding and agreement to proceed. Looking to learn how to stick to tasks, manage schedules, work on impulsive eating and spending. Described history of attention and impulsive behavior, over consumption of alcohol, and overeating.      A follow-up was scheduled to create a treatment plan and begin treatment. Therapist answered  and all questions during the evaluation and contact information was provided.    [name], LCMHC

## 2024-03-29 ENCOUNTER — Other Ambulatory Visit: Payer: Self-pay

## 2024-04-01 ENCOUNTER — Other Ambulatory Visit: Payer: Self-pay

## 2024-04-01 ENCOUNTER — Ambulatory Visit: Admitting: Psychiatry

## 2024-04-01 ENCOUNTER — Encounter: Payer: Self-pay | Admitting: Sleep Medicine

## 2024-04-02 NOTE — Telephone Encounter (Signed)
 I have spoke with Morna and let her know I have sent her cpap order to [facility] which is with [facility]. Nothing further needed for now

## 2024-04-07 DIAGNOSIS — Z630 Problems in relationship with spouse or partner: Secondary | ICD-10-CM | POA: Diagnosis not present

## 2024-04-08 ENCOUNTER — Other Ambulatory Visit (HOSPITAL_COMMUNITY): Payer: Self-pay

## 2024-04-08 ENCOUNTER — Encounter: Payer: Self-pay | Admitting: Psychiatry

## 2024-04-08 ENCOUNTER — Ambulatory Visit (INDEPENDENT_AMBULATORY_CARE_PROVIDER_SITE_OTHER): Admitting: Psychiatry

## 2024-04-08 VITALS — BP 122/86 | HR 84 | Temp 98.6°F | Ht 70.0 in | Wt 275.0 lb

## 2024-04-08 DIAGNOSIS — F411 Generalized anxiety disorder: Secondary | ICD-10-CM

## 2024-04-08 DIAGNOSIS — G4701 Insomnia due to medical condition: Secondary | ICD-10-CM | POA: Diagnosis not present

## 2024-04-08 DIAGNOSIS — F3342 Major depressive disorder, recurrent, in full remission: Secondary | ICD-10-CM

## 2024-04-08 DIAGNOSIS — F900 Attention-deficit hyperactivity disorder, predominantly inattentive type: Secondary | ICD-10-CM

## 2024-04-08 MED ORDER — LISDEXAMFETAMINE DIMESYLATE 20 MG PO CAPS
20.0000 mg | ORAL_CAPSULE | Freq: Every morning | ORAL | 0 refills | Status: DC
  Filled 2024-04-08: qty 30, 30d supply, fill #0

## 2024-04-08 MED ORDER — BUSPIRONE HCL 15 MG PO TABS
15.0000 mg | ORAL_TABLET | Freq: Two times a day (BID) | ORAL | 1 refills | Status: AC
  Filled 2024-04-08: qty 180, 90d supply, fill #0
  Filled 2024-08-04: qty 180, 90d supply, fill #1

## 2024-04-08 NOTE — Progress Notes (Signed)
 [facility] MD OP Progress Note  04/08/2024 9:02 AM Rhonda [name]  MRN:  [id number]  Chief Complaint:  Chief Complaint  Patient presents with   Follow-up   Depression   Anxiety   Medication Refill   Discussed the use of AI scribe software for clinical note transcription with the patient, who gave verbal consent to proceed.  History of Present Illness Rhonda [name] is a 38 year old Caucasian female, divorced, employed, lives in [place], has a history of MDD, GAD, insomnia, ADHD was evaluated in office today for a follow-up appointment.  Ongoing lack of energy and issues with appetite, specifically overeating and frequent thoughts about food, continue to affect her. She reports regaining all of the weight she previously lost and notes that behavior has contributed to this. She reports a history of using GLP-1 medications for weight management and identifies Zepbound  as the most effective option for her, but she no longer takes any GLP-1 due to insurance coverage and cost barriers. She attended a seminar for weight loss surgery and plans to discuss this option further with her doctor.  Current treatment for ADHD includes Adderall, which she takes once daily as needed rather than every day, resulting in leftover medication. She last picked up Adderall a few weeks ago. She is not currently taking Wellbutrin  and expresses interest in retrying Vyvanse  now that her insurance deductible has been met.  She describes her mood as stable and continues to take sertraline  (Zoloft ) and buspirone  (Buspar ). Generally, she sleeps well but she reports recently receiving a diagnosis of mild sleep apnea following a sleep study. She is in the process of obtaining a CPAP device. She currently uses Ambien  nearly every night to help with sleep and rarely uses hydroxyzine . She inquires about how to manage sleep medications once she starts using the CPAP.  She denies any thoughts of hurting herself.  Denies any  homicidality or perceptual disturbances.     Visit Diagnosis:    ICD-10-CM   1. Recurrent major depressive disorder, in full remission (HCC)  F33.42     2. GAD (generalized anxiety disorder)  F41.1 busPIRone  (BUSPAR ) 15 MG tablet    3. Insomnia due to medical condition  G47.01    mood, OSA mild    4. Attention deficit hyperactivity disorder (ADHD), predominantly inattentive type  F90.0 lisdexamfetamine (VYVANSE ) 20 MG capsule      Past Psychiatric History: I have reviewed past psychiatric history from progress note on 11/05/2018.  Past Medical History:  Past Medical History:  Diagnosis Date   Depression    treated   PCOS (polycystic ovarian syndrome) 2008   Plantar fasciitis     Past Surgical History:  Procedure Laterality Date   CHOLECYSTECTOMY      Family Psychiatric History: I have reviewed family psychiatric history from progress note on 11/05/2018.  Family History:  Family History  Problem Relation Age of Onset   High blood pressure Mother    Depression Father    Liver disease Father    Alcoholism Father    Alcohol abuse Father    Diabetes Paternal Grandmother    Bipolar disorder Maternal Aunt     Social History: I have reviewed social history from progress note on 11/05/2018. Social History   Socioeconomic History   Marital status: Divorced    Spouse name: Dorn   Number of children: 2   Years of education: Not on file   Highest education level: Bachelor's degree (e.g., BA, AB, BS)  Occupational History  Occupation: Charity fundraiser  Tobacco Use   Smoking status: Never   Smokeless tobacco: Never  Vaping Use   Vaping status: Never Used  Substance and Sexual Activity   Alcohol use: Yes    Alcohol/week: 1.0 standard drink of alcohol    Types: 1 Glasses of wine per week    Comment: Occasional once a week.   Drug use: No   Sexual activity: Yes    Partners: Male    Birth control/protection: None, I.U.D.  Other Topics Concern   Not on file  Social History  Narrative   Not on file   Social Drivers of Health   Financial Resource Strain: Low Risk  (06/19/2023)   [facility]   Overall Financial Resource Strain (CARDIA)    Difficulty of Paying Living Expenses: Not very hard  Food Insecurity: No Food Insecurity (06/19/2023)   Received from [facility]   Hunger Vital Sign    Within the past 12 months, you worried that your food would run out before you got the money to buy more.: Never true    Within the past 12 months, the food you bought just didn't last and you didn't have money to get more.: Never true  Transportation Needs: No Transportation Needs (06/19/2023)   Received from [facility] - Transportation    In the past 12 months, has lack of transportation kept you from medical appointments or from getting medications?: No    Lack of Transportation (Non-Medical): No  Physical Activity: Inactive (11/05/2018)   Exercise Vital Sign    Days of Exercise per Week: 0 days    Minutes of Exercise per Session: 0 min  Stress: Stress Concern Present (11/05/2018)   [employer] of Occupational Health - Occupational Stress Questionnaire    Feeling of Stress : Rather much  Social Connections: Unknown (11/05/2018)   Social Connection and Isolation Panel    Frequency of Communication with Friends and Family: Not on file    Frequency of Social Gatherings with Friends and Family: Not on file    Attends Religious Services: More than 4 times per year    Active Member of Clubs or Organizations: No    Attends Banker Meetings: Never    Marital Status: Married    Allergies: No Known Allergies  Metabolic Disorder Labs: Lab Results  Component Value Date   HGBA1C 5.1 03/17/2024   No results found for: PROLACTIN Lab Results  Component Value Date   CHOL 174 10/07/2023   TRIG 112 10/07/2023   HDL 41 10/07/2023   CHOLHDL 4.2 10/07/2023   LDLCALC 113 (H)  10/07/2023   LDLCALC 75 04/02/2022   Lab Results  Component Value Date   TSH 2.170 10/07/2023   TSH 1.680 01/24/2023    Therapeutic Level Labs: No results found for: LITHIUM No results found for: VALPROATE No results found for: CBMZ  Current Medications: Current Outpatient Medications  Medication Sig Dispense Refill   hydrOXYzine  (VISTARIL ) 25 MG capsule Take 1-2 capsules (25-50 mg total) by mouth at bedtime as needed for sleep 180 capsule 0   levocetirizine (XYZAL) 5 MG tablet Take 5 mg by mouth every evening.     levonorgestrel  (MIRENA ) 20 MCG/24HR IUD 1 each by Intrauterine route once.     lisdexamfetamine (VYVANSE ) 20 MG capsule Take 1 capsule (20 mg total) by mouth in the morning. Stop Adderall 30 capsule 0   Multiple Vitamin (MULTIVITAMIN) tablet Take 1 tablet  by mouth daily.     Probiotic Product (PROBIOTIC-10 PO) Take by mouth.     propranolol  (INDERAL ) 20 MG tablet Take 1 tablet (20 mg total) by mouth 2 (two) times daily as needed 60 tablet 1   sertraline  (ZOLOFT ) 50 MG tablet Take 1 tablet (50 mg total) by mouth daily. 90 tablet 0   spironolactone  (ALDACTONE ) 100 MG tablet Take 1 tablet (100 mg total) by mouth daily. 90 tablet 3   Vitamin D , Ergocalciferol , (DRISDOL ) 1.25 MG (50000 UNIT) CAPS capsule Take 1 capsule (50,000 Units total) by mouth every 7 (seven) days. 12 capsule 0   zolpidem  (AMBIEN  CR) 6.25 MG CR tablet Take 1 tablet (6.25 mg total) by mouth at bedtime as needed for sleep. 30 tablet 2   busPIRone  (BUSPAR ) 15 MG tablet Take 1 tablet (15 mg total) by mouth 2 (two) times daily. 180 tablet 1   No current facility-administered medications for this visit.     Musculoskeletal: Strength & Muscle Tone: within normal limits Gait & Station: normal Patient leans: N/A  Psychiatric Specialty Exam: Review of Systems  Psychiatric/Behavioral:  Positive for sleep disturbance.     Blood pressure 122/86, pulse 84, temperature 98.6 F (37 C), temperature source  Temporal, height 5' 10 (1.778 m), weight 275 lb (124.7 kg), SpO2 96%.Body mass index is 39.46 kg/m.  General Appearance: Casual  Eye Contact:  Fair  Speech:  Clear and Coherent  Volume:  Normal  Mood:  Euthymic  Affect:  Congruent  Thought Process:  Goal Directed and Descriptions of Associations: Intact  Orientation:  Full (Time, Place, and Person)  Thought Content: Logical   Suicidal Thoughts:  No  Homicidal Thoughts:  No  Memory:  Immediate;   Fair Recent;   Fair Remote;   Fair  Judgement:  Fair  Insight:  Good  Psychomotor Activity:  Normal  Concentration:  Concentration: Fair and Attention Span: Fair  Recall:  [employer] of Knowledge: Fair  Language: Fair  Akathisia:  No  Handed:  Left  AIMS (if indicated): not done  Assets:  Communication Skills Desire for Improvement Housing Social Support Talents/Skills Transportation Vocational/Educational  ADL's:  Intact  Cognition: WNL  Sleep:  Poor   Screenings: GAD-7    Garment/textile technologist Visit from 04/08/2024 in [facility] Office Visit from 10/23/2023 in [facility] Office Visit from 08/27/2023 in [facility] Office Visit from 03/18/2023 in [facility] Office Visit from 01/23/2023 in [facility]  Total GAD-7 Score 3 4 5 4 12    PHQ2-9    Flowsheet Row Office Visit from 04/08/2024 in [facility] Office Visit from 10/23/2023 in [facility] Office Visit from 08/27/2023 in [facility] Office Visit from 03/18/2023 in [facility] Office Visit from 01/23/2023 in [facility]  PHQ-2 Total Score 0 0 1 1 3   PHQ-9 Total Score -- -- -- 8 14    Flowsheet Row Office Visit from 04/08/2024 in [facility] Video Visit from 12/23/2023 in [facility] Office Visit from 10/23/2023 in [facility]  C-SSRS RISK CATEGORY No Risk No Risk No Risk     Assessment and Plan: Rhonda [name] is a 38 year old Caucasian female, divorced, employed, has a  history of depression, anxiety, ADHD was evaluated by telemedicine today.  Discussed assessment and plan as noted below.  Generalized anxiety disorder-stable Currently well managed on the current medication regimen. Continue BuSpar  15 mg twice daily Continue Zoloft  50 mg daily  Depression in remission Currently denies any significant depression symptoms. Continue Zoloft  50 mg daily  Attention deficit hyperactivity disorder-improving Currently interested in going back to Vyvanse  which she preferred. Discontinue Adderall. Restart Vyvanse  20 mg daily in the morning Reviewed [place] PMP AWARxE  Insomnia-unstable Had sleep study recently with pulmonology, diagnosed with mild sleep apnea.  I have reviewed notes per Dr. Devona Skiff dated 02/15/2024. Currently awaiting CPAP device. Continue Ambien  6.25 mg at bedtime as needed Continue Hydroxyzine  25-50 mg at bedtime as needed Continue sleep hygiene techniques  Follow-up Follow-up in clinic in 2 months or sooner if needed.   Consent: Patient/Guardian gives verbal consent for treatment and assignment of benefits for services provided during this visit. Patient/Guardian expressed understanding and agreed to proceed.   This note was generated in part or whole with voice recognition software. Voice recognition is usually quite accurate but there are transcription errors that can and very often do occur. I apologize for any typographical errors that were not detected and corrected.    Chauntel Windsor, MD 04/08/2024, 9:02 AM

## 2024-04-08 NOTE — Patient Instructions (Signed)
 Lisdexamfetamine Capsules What is this medication? LISDEXAMFETAMINE (lis DEX am fet a meen) treats attention-deficit hyperactivity disorder (ADHD). It may also be used to treat binge eating disorder. It works by reducing hyperactivity and impulsive behaviors. It belongs to a group of medications called stimulants. This medicine may be used for other purposes; ask your health care provider or pharmacist if you have questions. COMMON BRAND NAME(S): Vyvanse What should I tell my care team before I take this medication? They need to know if you have any of these conditions: Anxiety or panic attacks Circulation problems in fingers and toes Glaucoma Hardening or blockages of the arteries or heart blood vessels Heart disease or a heart defect High blood pressure History of stroke Kidney disease Liver disease Mental health condition Seizures Substance use disorder Suicidal thoughts, plans, or attempt by you or a family member Thyroid disease Tourette's syndrome An unusual or allergic reaction to lisdexamfetamine, other medications, foods, dyes, or preservatives Pregnant or trying to get pregnant Breast-feeding How should I use this medication? Take this medication by mouth. Follow the directions on the prescription label. Swallow the capsules with a drink of water. You may open capsule and add to a glass of water, then drink right away. Take your doses at regular intervals. Do not take your medication more often than directed. Do not suddenly stop your medication. You must gradually reduce the dose, or you may feel withdrawal effects. Ask your care team for advice. A special MedGuide will be given to you by the pharmacist with each prescription and refill. Be sure to read this information carefully each time. Talk to your care team about the use of this medication in children. While this medication may be prescribed for children as young as 34 years of age for selected conditions, precautions do  apply. Overdosage: If you think you have taken too much of this medicine contact a poison control center or emergency room at once. NOTE: This medicine is only for you. Do not share this medicine with others. What if I miss a dose? If you miss a dose, take it as soon as you can. If it is almost time for your next dose, take only that dose. Do not take double or extra doses. What may interact with this medication? Do not take this medication with any of the following: Linezolid MAOIs, such as Marplan, Nardil, and Parnate Methylene blue This medication may also interact with the following: Acetazolamide Alcohol Ascorbic acid Certain medications for depression, anxiety, or other mental health conditions Certain medications for migraines, such as sumatriptan Guanethidine Opioids Reserpine Sodium bicarbonate St. John's wort Thiazide diuretics, such as chlorothiazide Tryptophan This list may not describe all possible interactions. Give your health care provider a list of all the medicines, herbs, non-prescription drugs, or dietary supplements you use. Also tell them if you smoke, drink alcohol, or use illegal drugs. Some items may interact with your medicine. What should I watch for while using this medication? Visit your care team for regular checks on your progress. Tell your care team if your symptoms do not start to get better or if they get worse. This medication requires a new prescription from your care team every time it is filled at the pharmacy. This medication can be abused and cause your brain and body to depend on it after high doses or long term use. Your care team will assess your risk and monitor you closely during treatment. Long term use of this medication may cause your brain and  body to depend on it. You may be able to take breaks from this medication during weekends, holidays, or summer vacations. Talk to your care team about what works for you. If your care team wants you  to stop this medication permanently, the dose may be slowly lowered over time to reduce the risk of side effects. Tell your care team if this medication loses its effects, or if you feel you need to take more than the prescribed amount. Do not change your dose without talking to your care team. Do not take this medication close to bedtime. It may prevent you from sleeping. Loss of appetite is common when starting this medication. Eating small, frequent meals or snacks can help. Talk to your care team if appetite loss persists. Children should have height and weight checked often while taking this medication. Tell your care team right away if you notice unexplained wounds on your fingers and toes while taking this medication. You should also tell your care team if you experience numbness or pain, changes in the skin color, or sensitivity to temperature in your fingers or toes. Contact your care team right away if you have an erection that lasts longer than 4 hours or if it becomes painful. This may be a sign of a serious problem and must be treated right away to prevent permanent damage. What side effects may I notice from receiving this medication? Side effects that you should report to your care team as soon as possible: Allergic reactions--skin rash, itching, hives, swelling of the face, lips, tongue, or throat Heart attack--pain or tightness in the chest, shoulders, arms, or jaw, nausea, shortness of breath, cold or clammy skin, feeling faint or lightheaded Heart rhythm changes--fast or irregular heartbeat, dizziness, feeling faint or lightheaded, chest pain, trouble breathing Increase in blood pressure Irritability, confusion, fast or irregular heartbeat, muscle stiffness, twitching muscles, sweating, high fever, seizure, chills, vomiting, diarrhea, which may be signs of serotonin syndrome Mood and behavior changes--anxiety, nervousness, confusion, hallucinations, irritability, hostility, thoughts  of suicide or self-harm, worsening mood, feelings of depression Prolonged or painful erection Raynaud syndrome--cool, numb, or painful fingers or toes that may change color from pale, to blue, to red Seizures Stroke--sudden numbness or weakness of the face, arm, or leg, trouble speaking, confusion, trouble walking, loss of balance or coordination, dizziness, severe headache, change in vision Side effects that usually do not require medical attention (report these to your care team if they continue or are bothersome): Dry mouth Headache Loss of appetite with weight loss Nausea Stomach pain Trouble sleeping This list may not describe all possible side effects. Call your doctor for medical advice about side effects. You may report side effects to FDA at 1-800-FDA-1088. Where should I keep my medication? Keep out of the reach of children and pets. This medication can be abused. Keep it in a safe place to protect it from theft. Do not share it with anyone. It is only for you. Selling or giving away this medication is dangerous and against the law. Store at room temperature between 15 and 30 degrees C (59 and 86 degrees F). Protect from light. Keep container tightly closed. Get rid of any unused medication after the expiration date. This medication may cause harm and death if it is taken by other adults, children, or pets. It is important to get rid of the medication as soon as you no longer need it or it is expired. You can do this in two ways: Take the medication  to a medication take-back program. Check with your pharmacy or law enforcement to find a location. If you cannot return the medication, check the label or package insert to see if the medication should be thrown out in the garbage or flushed down the toilet. If you are not sure, ask your care team. If it is safe to put it in the trash, take the medication out of the container. Mix the medication with cat litter, dirt, coffee grounds, or other  unwanted substance. Seal the mixture in a bag or container. Put it in the trash. NOTE: This sheet is a summary. It may not cover all possible information. If you have questions about this medicine, talk to your doctor, pharmacist, or health care provider.  2024 Elsevier/Gold Standard (2023-07-19 00:00:00)

## 2024-04-09 ENCOUNTER — Ambulatory Visit (INDEPENDENT_AMBULATORY_CARE_PROVIDER_SITE_OTHER): Admitting: Licensed Clinical Social Worker

## 2024-04-09 ENCOUNTER — Encounter: Payer: Self-pay | Admitting: Licensed Clinical Social Worker

## 2024-04-09 DIAGNOSIS — R41840 Attention and concentration deficit: Secondary | ICD-10-CM

## 2024-04-09 DIAGNOSIS — F419 Anxiety disorder, unspecified: Secondary | ICD-10-CM | POA: Diagnosis not present

## 2024-04-09 NOTE — Progress Notes (Signed)
 [facility] Counselor/Therapist Progress Note  Patient ID: Rhonda [name], MRN: [id number]   Date: 04/09/24  Time Spent: 9:03  am - 10:01 am : 58 Minutes  Treatment Type: Individual Therapy.  Reported Symptoms: inability to manage time, focus and easily distracted at work, hard time saying no and feels like she is disappointing self and others when she does, starting new meds for ADHD  Mental Status Exam: Appearance:  Well Groomed     Behavior: Appropriate and Evasive  Motor: Normal  Speech/Language:  Normal Rate  Affect: Flat  Mood: normal  Thought process: normal  Thought content:   WNL  Sensory/Perceptual disturbances:   WNL  Orientation: oriented to person, place, and time/date  Attention: Good  Concentration: Good  Memory: WNL  Fund of knowledge:  Good  Insight:   Good  Judgment:  Good  Impulse Control: Good   Risk Assessment: Danger to Self:  No Self-injurious Behavior: No Danger to Others: No Duty to Warn:no Physical Aggression / Violence:No  Access to Firearms a concern: No  Gang Involvement:No   Subjective:   DEJIA [name] participated from home, via video and consented to treatment. Therapist participated from home office. I discussed the limitations of evaluation and management by telemedicine and the availability of in person appointments. The patient expressed understanding and agreed to proceed. Rhonda reviewed the events of the past week.      We reviewed numerous treatment approaches including CBT and Solution focused therapy. Psych-education regarding the Avalin's diagnosis of Attention and concentration deficit  Anxiety was provided during the session. We discussed [name]'s goals treatment goals which include Improve time management and assertive communication skills to enhance work performance and reduce stress related to ADHD symptoms.. [name] provided verbal approval of the treatment plan.    Interventions: Psycho-education & Goal Setting.   Diagnosis:  Attention and concentration deficit  Anxiety  Psychiatric Treatment: No , N/A   Treatment Plan:  Client Abilities/Strengths [name] is open to sessions.    Support System: Family and Friends   Client Treatment Preferences Hybrid  Treatment Level Biweekly  Symptoms  Hopeful   (Status: improved) Optimistic   (Status: improved)  Goals:   [name] agreed to set goal to improve time management and assertive communication skills to enhance work performance and reduce stress related to ADHD symptoms. Will be starting the Vyvanse .   Treatment plan signed and available on s-drive:  Yes    Target Date: 06/04/24 Frequency: Biweekly  Progress: 0 Modality: individual    Therapist will provide referrals for additional resources as appropriate.  Therapist will provide psycho-education regarding [name]'s diagnosis and corresponding treatment approaches and interventions. Licensed Clinical Mental Health Counselor, [name], [facility] will support the patient's ability to achieve the goals identified. will employ CBT, BA, Problem-solving, Solution Focused, Mindfulness,  coping skills, & other evidenced-based practices will be used to promote progress towards healthy functioning to help manage decrease symptoms associated with her diagnosis.   Reduce overall level, frequency, and intensity of the feelings of depression, anxiety and panic evidenced by increased time management skills to reduce anxiety from attention deficit.   Verbally express understanding of the relationship between feelings of depression, anxiety and their impact on thinking patterns and behaviors. Verbalize an understanding of the role that distorted thinking plays in creating fears, excessive worry, and ruminations.    [name] participated in the creation of the treatment plan)    [name], LCMHC

## 2024-04-14 ENCOUNTER — Encounter (INDEPENDENT_AMBULATORY_CARE_PROVIDER_SITE_OTHER): Payer: Self-pay | Admitting: Family Medicine

## 2024-04-14 ENCOUNTER — Ambulatory Visit (INDEPENDENT_AMBULATORY_CARE_PROVIDER_SITE_OTHER): Admitting: Family Medicine

## 2024-04-14 ENCOUNTER — Ambulatory Visit (INDEPENDENT_AMBULATORY_CARE_PROVIDER_SITE_OTHER): Admitting: Licensed Clinical Social Worker

## 2024-04-14 VITALS — BP 119/78 | HR 79 | Temp 98.3°F | Ht 70.0 in | Wt 268.0 lb

## 2024-04-14 DIAGNOSIS — E559 Vitamin D deficiency, unspecified: Secondary | ICD-10-CM

## 2024-04-14 DIAGNOSIS — R7401 Elevation of levels of liver transaminase levels: Secondary | ICD-10-CM

## 2024-04-14 DIAGNOSIS — E669 Obesity, unspecified: Secondary | ICD-10-CM

## 2024-04-14 DIAGNOSIS — E66812 Obesity, class 2: Secondary | ICD-10-CM

## 2024-04-14 DIAGNOSIS — Z6838 Body mass index (BMI) 38.0-38.9, adult: Secondary | ICD-10-CM

## 2024-04-14 DIAGNOSIS — R41840 Attention and concentration deficit: Secondary | ICD-10-CM

## 2024-04-14 NOTE — Progress Notes (Signed)
**Note Rhonda-Identified via Obfuscation**  [facility] Counselor/Therapist Progress Note  Patient ID: Rhonda [name], MRN: [id number]    Date: 04/14/24  Time Spent: 9:04  am - 9:59 am : 55 Minutes  Treatment Type: Individual Therapy.  Reported Symptoms: busy and in a fog   Mental Status Exam: Appearance:  Well Groomed     Behavior: Appropriate  Motor: Normal  Speech/Language:  Normal Rate  Affect: Appropriate  Mood: anxious  Thought process: normal  Thought content:   WNL  Sensory/Perceptual disturbances:   WNL  Orientation: oriented to person, place, and time/date  Attention: Good  Concentration: Good  Memory: WNL  Fund of knowledge:  Good  Insight:   Good  Judgment:  Good  Impulse Control: Good   Risk Assessment: Danger to Self:  No Self-injurious Behavior: No Danger to Others: No Duty to Warn:no Physical Aggression / Violence:No  Access to Firearms a concern: No  Gang Involvement:No   Subjective:   Rhonda [name] participated in the session, in person in the office with the therapist, and consented to treatment. Rhonda [name] reviewed the events of the past week.      Interventions: Mindfulness Meditation and Solution-Oriented/Positive Psychology  Diagnosis:   Attention and concentration deficit  Psychiatric Treatment: No , N/A  Treatment Plan:  Client Abilities/Strengths Devita is open to sessions.    Support System: Family and Friends   Engineer, mining of Needs Rhonda [name] would like to learn strategies to manage her new ADHD diagnosis.     Treatment Level Biweekly  Symptoms  Disorganized/Anxious   (Status: maintained) Brain Fog   (Status: maintained)  Goals:   Rhonda [name] experiences symptoms of crashing by 2-3 pm tired and easily distracted ready to be done with the day.  Has been utilizing caffeine at that time and notices that it takes her longer to complete tasks. Micro Goal- Weeks she has her children she can be off task,  implementing a chore chart of duties for kids, self and partner.  Review and find a template for work meetings, AI note taker for auditory capture of meeting minutes, f/u on micro goal from last week as they were not completed.     Target Date: 06/04/24 Frequency: Biweekly  Progress: 0 Modality: individual    Therapist will provide referrals for additional resources as appropriate.  Therapist will provide psycho-education regarding Rhonda [name]'s diagnosis and corresponding treatment approaches and interventions. Licensed Clinical Mental Health Counselor, [name], [facility] will support the patient's ability to achieve the goals identified. will employ CBT, BA, Problem-solving, Solution Focused, Mindfulness,  coping skills, & other evidenced-based practices will be used to promote progress towards healthy functioning to help manage decrease symptoms associated with her diagnosis.   Reduce overall level, frequency, and intensity of the feelings of depression, anxiety and panic evidenced by decreased brain fog and increased ability to organize thoughts and duties at home and at work.   Verbally express understanding of the relationship between feelings of depression, anxiety and their impact on thinking patterns and behaviors. Verbalize an understanding of the role that distorted thinking plays in creating fears, excessive worry, and ruminations.  Rhonda [name] participated in the creation of the treatment plan)      Maudean Hoffmann, LCMHC

## 2024-04-14 NOTE — Progress Notes (Signed)
   SUBJECTIVE:  Chief Complaint: Obesity  Interim History: Patient here for follow up.  She is contemplating bariatric surgery.  She feels this is extreme.  She went to the seminar and the next step is filling out paperwork.  She is wondering about writing a letter of necessity.  Patient recently had a medication change of adderall to vyvanse .  She has been able to follow the meal plan around 60%.  She has been eating out slightly more on the weeks she doesn't have her kids.  She will get food from cookout frequently.  She has her kids this weekend and she is planning for baseball practice and her niece and nephew spending the night over the weekend.   Rhonda [name] is here to discuss her progress with her obesity treatment plan. She is on the Category 3 Plan and states she is following her eating plan approximately 60 % of the time. She states she is moving.   OBJECTIVE: Visit Diagnoses: Problem List Items Addressed This Visit       Other   Vitamin D  deficiency - Primary   Transaminitis   Other Visit Diagnoses       Obesity, Starting BMI 39.17         BMI 38.0-38.9,adult           Vitals Temp: 98.3 F (36.8 C) BP: 119/78 Pulse Rate: 79 SpO2: 97 %   Anthropometric Measurements Height: 5' 10 (1.778 m) Weight: 268 lb (121.6 kg) BMI (Calculated): 38.45 Weight at Last Visit: 272 lb Weight Lost Since Last Visit: 4 Weight Gained Since Last Visit: 0 Starting Weight: 273 lb Total Weight Loss (lbs): 5 lb (2.268 kg)   Body Composition  Body Fat %: 42.4 % Fat Mass (lbs): 113.8 lbs Muscle Mass (lbs): 146.8 lbs Total Body Water (lbs): 101.2 lbs Visceral Fat Rating : 11   Other Clinical Data Today's Visit #: 34 Starting Date: 01/09/18 Comments: Cat 3     ASSESSMENT AND PLAN:  Diet: Rhonda [name] is currently in the action stage of change. As such, her goal is to continue with weight loss efforts and has agreed to the Category 3 Plan.   Exercise:  For substantial health  benefits, adults should do at least 150 minutes (2 hours and 30 minutes) a week of moderate-intensity, or 75 minutes (1 hour and 15 minutes) a week of vigorous-intensity aerobic physical activity, or an equivalent combination of moderate- and vigorous-intensity aerobic activity. Aerobic activity should be performed in episodes of at least 10 minutes, and preferably, it should be spread throughout the week.  Behavior Modification:  We discussed the following Behavioral Modification Strategies today: increasing lean protein intake, decreasing simple carbohydrates, increasing vegetables, meal planning and cooking strategies, and planning for success.   Return in about 5 weeks (around 05/19/2024).   She was informed of the importance of frequent follow up visits to maximize her success with intensive lifestyle modifications for her multiple health conditions.  Attestation Statements:   Reviewed by clinician on day of visit: allergies, medications, problem list, medical history, surgical history, family history, social history, and previous encounter notes.   Rhonda Cho, MD

## 2024-04-15 DIAGNOSIS — H52222 Regular astigmatism, left eye: Secondary | ICD-10-CM | POA: Diagnosis not present

## 2024-04-15 DIAGNOSIS — H5213 Myopia, bilateral: Secondary | ICD-10-CM | POA: Diagnosis not present

## 2024-04-22 ENCOUNTER — Encounter (INDEPENDENT_AMBULATORY_CARE_PROVIDER_SITE_OTHER): Payer: Self-pay | Admitting: Family Medicine

## 2024-04-23 ENCOUNTER — Ambulatory Visit: Admitting: Licensed Clinical Social Worker

## 2024-04-23 NOTE — Telephone Encounter (Signed)
**Note De-identified  [place] Obfuscation** Please advise 

## 2024-04-28 DIAGNOSIS — Z630 Problems in relationship with spouse or partner: Secondary | ICD-10-CM | POA: Diagnosis not present

## 2024-05-01 DIAGNOSIS — G4733 Obstructive sleep apnea (adult) (pediatric): Secondary | ICD-10-CM | POA: Diagnosis not present

## 2024-05-06 ENCOUNTER — Ambulatory Visit: Admitting: Licensed Clinical Social Worker

## 2024-05-06 DIAGNOSIS — R41840 Attention and concentration deficit: Secondary | ICD-10-CM | POA: Diagnosis not present

## 2024-05-06 NOTE — Addendum Note (Signed)
 Addended by: VICCI TAWNI CROME on: 05/06/2024 03:24 PM   Modules accepted: Level of Service

## 2024-05-06 NOTE — Progress Notes (Signed)
 [facility] Counselor/Therapist Progress Note  Patient ID: Rhonda [name], MRN: [id number]    Date: 05/06/24  Time Spent: 11:11  am - 12:10 pm : 59 Minutes  Treatment Type: Individual Therapy.   Mental Status Exam: Appearance:  Well Groomed     Behavior: Appropriate and Care-Taking  Motor: Normal  Speech/Language:  Normal Rate  Affect: Appropriate  Mood: normal  Thought process: normal  Thought content:   WNL  Sensory/Perceptual disturbances:   WNL  Orientation: oriented to person, place, and time/date  Attention: Good  Concentration: Good  Memory: WNL  Fund of knowledge:  Good  Insight:   Good  Judgment:  Good  Impulse Control: Good   Risk Assessment: Danger to Self:  No Self-injurious Behavior: No Danger to Others: No Duty to Warn:no Physical Aggression / Violence:No  Access to Firearms a concern: No  Gang Involvement:No   Subjective:   Rhonda [name] participated in the session, in person in the office with the therapist, and consented to treatment. Rhonda [name] reviewed the events of the past week. Patient reported that she avoided discussing concerns with her supervisor due to her own fear, not because her supervisor is unsupportive. She shared that she often feels bad when telling others no, which leads her to commit to tasks she does not want to do, increasing her stress levels. 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     Interventions: Assertiveness/Communication, Solution-Oriented/Positive Psychology, and Psycho-education/Bibliotherapy  Diagnosis:   Attention and concentration deficit   Psychiatric Treatment: No , N/A  Treatment Plan:  Client Abilities/Strengths Rhonda [name] is open to sessions.  She appeared engaged and reflective during the session. She demonstrated insight into her behavioral patterns and emotional responses. Actively participated in  planning and problem-solving strategies. No signs of acute distress or mood instability observed.  Support System: Family and Friends  Hospital doctor Preferences In person  Client Statement of Needs Rhonda [name] would like to like to learn strategies to manage her new ADHD diagnosis.        Treatment Level Biweekly  Symptoms  Progressive   (Status: improved) More expressive   (Status: improved)  Goals:   Rhonda [name] is making progress in understanding the emotional roots of her work-related stress and overcommitment. Issues with attention and focus are being addressed through therapeutic collaboration. She is receptive to implementing practical strategies for improving workflow and reducing anxiety. Insight is increasing, and motivation for change remains strong. Send patient a self-assessment link for identifying learning style to enhance workflow planning. Support patient in setting boundaries and managing overcommitment.   Target Date: 06/04/24 Frequency: Biweekly  Progress: 0 Modality: individual    Therapist will provide referrals for additional resources as appropriate.  Therapist will provide psycho-education regarding Rhonda [name]'s diagnosis and corresponding treatment approaches and interventions. Licensed Clinical Mental Health Counselor, [name], [facility] will support the patient's ability to achieve the goals identified. will employ CBT, BA, Problem-solving, Solution Focused, Mindfulness,  coping skills, & other evidenced-based practices will be used to promote progress towards healthy functioning to help manage decrease symptoms associated with her diagnosis.   Reduce overall level, frequency, and intensity of the feelings of depression, anxiety and panic evidenced by decreased lack of organization and feelings of being overwhelmed.  Verbally express understanding of the relationship between feelings of depression, anxiety and their impact on thinking patterns and  behaviors. Verbalize an understanding of the role that distorted thinking plays in creating fears,  excessive worry, and ruminations.  [name] participated in the creation of the treatment plan)    Tucker Minter, LCMHC

## 2024-05-12 ENCOUNTER — Telehealth: Admitting: Physician Assistant

## 2024-05-12 ENCOUNTER — Other Ambulatory Visit (HOSPITAL_COMMUNITY): Payer: Self-pay

## 2024-05-12 DIAGNOSIS — B3731 Acute candidiasis of vulva and vagina: Secondary | ICD-10-CM | POA: Diagnosis not present

## 2024-05-12 MED ORDER — FLUCONAZOLE 150 MG PO TABS
150.0000 mg | ORAL_TABLET | Freq: Once | ORAL | 0 refills | Status: AC
  Filled 2024-05-12: qty 2, 6d supply, fill #0

## 2024-05-12 NOTE — Progress Notes (Signed)
 I have spent 5 minutes in review of e-visit questionnaire, review and updating patient chart, medical decision making and response to patient.   Elsie Velma Lunger, PA-C

## 2024-05-12 NOTE — Progress Notes (Signed)

## 2024-05-18 ENCOUNTER — Other Ambulatory Visit: Payer: Self-pay | Admitting: Psychiatry

## 2024-05-18 ENCOUNTER — Other Ambulatory Visit (HOSPITAL_COMMUNITY): Payer: Self-pay

## 2024-05-18 DIAGNOSIS — F5105 Insomnia due to other mental disorder: Secondary | ICD-10-CM

## 2024-05-18 DIAGNOSIS — L821 Other seborrheic keratosis: Secondary | ICD-10-CM | POA: Diagnosis not present

## 2024-05-18 DIAGNOSIS — F900 Attention-deficit hyperactivity disorder, predominantly inattentive type: Secondary | ICD-10-CM

## 2024-05-18 MED ORDER — LISDEXAMFETAMINE DIMESYLATE 20 MG PO CAPS
20.0000 mg | ORAL_CAPSULE | Freq: Every morning | ORAL | 0 refills | Status: DC
  Filled 2024-05-18: qty 30, 30d supply, fill #0

## 2024-05-18 MED ORDER — ZOLPIDEM TARTRATE ER 6.25 MG PO TBCR
6.2500 mg | EXTENDED_RELEASE_TABLET | Freq: Every evening | ORAL | 2 refills | Status: AC | PRN
  Filled 2024-05-18: qty 30, 30d supply, fill #0
  Filled 2024-06-15: qty 30, 30d supply, fill #1
  Filled 2024-07-20: qty 30, 30d supply, fill #2

## 2024-05-19 ENCOUNTER — Ambulatory Visit (INDEPENDENT_AMBULATORY_CARE_PROVIDER_SITE_OTHER): Admitting: Adult Health

## 2024-05-19 ENCOUNTER — Other Ambulatory Visit: Payer: Self-pay

## 2024-05-19 VITALS — BP 120/74 | HR 69 | Temp 98.1°F | Ht 70.0 in | Wt 275.0 lb

## 2024-05-19 DIAGNOSIS — E559 Vitamin D deficiency, unspecified: Secondary | ICD-10-CM

## 2024-05-19 DIAGNOSIS — Z6839 Body mass index (BMI) 39.0-39.9, adult: Secondary | ICD-10-CM | POA: Diagnosis not present

## 2024-05-19 DIAGNOSIS — R7401 Elevation of levels of liver transaminase levels: Secondary | ICD-10-CM

## 2024-05-19 DIAGNOSIS — E669 Obesity, unspecified: Secondary | ICD-10-CM

## 2024-05-19 DIAGNOSIS — R7989 Other specified abnormal findings of blood chemistry: Secondary | ICD-10-CM | POA: Diagnosis not present

## 2024-05-19 DIAGNOSIS — R632 Polyphagia: Secondary | ICD-10-CM | POA: Diagnosis not present

## 2024-05-19 DIAGNOSIS — Z0000 Encounter for general adult medical examination without abnormal findings: Secondary | ICD-10-CM

## 2024-05-19 MED ORDER — VITAMIN D (ERGOCALCIFEROL) 1.25 MG (50000 UNIT) PO CAPS
50000.0000 [IU] | ORAL_CAPSULE | ORAL | 0 refills | Status: DC
  Filled 2024-05-20 – 2024-05-27 (×2): qty 8, 28d supply, fill #0

## 2024-05-19 MED ORDER — VITAMIN D (ERGOCALCIFEROL) 1.25 MG (50000 UNIT) PO CAPS
50000.0000 [IU] | ORAL_CAPSULE | ORAL | 0 refills | Status: DC
  Filled 2024-05-19: qty 8, 28d supply, fill #0

## 2024-05-19 NOTE — Progress Notes (Signed)
 WEIGHT SUMMARY AND BIOMETRICS  Vitals Temp: 98.1 F (36.7 C) BP: 120/74 Pulse Rate: 69 SpO2: 98 %   Anthropometric Measurements Height: 5' 10 (1.778 m) Weight: 275 lb (124.7 kg) BMI (Calculated): 39.46 Weight at Last Visit: 268 lb Weight Lost Since Last Visit: 0 Weight Gained Since Last Visit: 7 lb Starting Weight: 273 lb Total Weight Loss (lbs): 0 lb (0 kg)   Body Composition  Body Fat %: 44.1 % Fat Mass (lbs): 121.4 lbs Muscle Mass (lbs): 146.2 lbs Total Body Water (lbs): 103.6 lbs Visceral Fat Rating : 11   Other Clinical Data Fasting: no Labs: no Today's Visit #: 80 Starting Date: 01/09/18    Chief Complaint:   OBESITY Rhonda [name] is here to discuss her progress with her obesity treatment plan.  She is on the the Category 3 Plan and states she is following her eating plan approximately 50 % of the time.  She states she is exercising  minutes: None  Interim History:  Rhonda [name] has moved into her new home in [place]. She lives with her two children boy (age 17), gilr (age 20), and her boyfriend Lenoard). Paul's daughter (age 71) is with the family Thursday through Sunday several weeks of the month. It has been an adjustment for the blended family.  She continues to eat out or seek out convenience foods due to her hectic schedule.  Her insurance will not cover GLP-1 or GIP/GLP-1 therapy for weight loss. Alternative oral weight loss medications have been ineffective. She will have a consultation OV with Bariatric Surgeon next week.   Subjective:   1. Vitamin D  deficiency  Latest Reference Range & Units 01/24/23 09:39 09/02/23 09:13 03/17/24 09:11  Vitamin D , 25-Hydroxy 30.0 - 100.0 ng/mL 45.4 35.3 27.7 (L)  (L): Data is abnormally low  Vit D Level subtherapeutic She is on weekly Ergocalciferol   2. Transaminitis She denise RUQ pain  Latest Reference Range & Units 03/17/24 09:11  Alkaline Phosphatase 44 - 121 IU/L 71  Albumin 3.9 - 4.9 g/dL  4.5  AST 0 - 40 IU/L 23  ALT 0 - 32 IU/L 36 (H)  (H): Data is abnormally high  3. Polyphagia 01/29/2024 HWW OV- Phentermine  d/c'd- ineffective for weight loss Topamax  increased from 50mg  to 100mg  daily She reports worsening word finding problems and feels that higher dose of Topamax  is not reducing cravings/food noise. 03/17/2024- mutual agreement to stop Topamax   4. Healthcare maintenance -Not currently on any antidiabetic therapy -Pulmonology recently attempted Zepbound  therapy for newly dx'd OSA- denied by [facility] -She has consultation with Bariatric Surgeon next week -She has started wearing nightly CPAP, estimates to use 3.5 hours- she has been removing face mask off at 0100.  5. Elevated serum creatinine BP excellent at OV  Latest Reference Range & Units 01/24/23 09:39 09/02/23 09:13 03/17/24 09:11  Creatinine 0.57 - 1.00 mg/dL 9.02 9.10 9.28    Latest Reference Range & Units 01/24/23 09:39 09/02/23 09:13 03/17/24 09:11  eGFR >59 mL/min/1.73 77 86 112   She is not on ACE or ARB therapy  Assessment/Plan:   1. Vitamin D  deficiency (Primary) Refill and INCREASE Vitamin D , Ergocalciferol , (DRISDOL ) 1.25 MG (50000 UNIT) CAPS capsule (Future) Take 1 capsule (50,000 Units total) by mouth 2 (two) times a week. Dispense: 8 capsule, Refills: 0 ordered   2. Transaminitis Continue with weight loss efforts  3. Polyphagia Increase lean protein intake Limit eating out Increase daily activity  4. Healthcare maintenance Increase lean protein  intake Limit eating out Increase daily activity  5. Elevated serum creatinine Keep BP well controlled Avoid Nephrotoxic substances Monitor Labs  6. Obesity, current BMI 39.5  Rhonda [name] is not currently in the action stage of change. As such, her goal is to get back to weightloss efforts . She has agreed to the Category 3 Plan.   Exercise goals: All adults should avoid inactivity. Some physical activity is better than none,  and adults who participate in any amount of physical activity gain some health benefits. Adults should also include muscle-strengthening activities that involve all major muscle groups on 2 or more days a week.  Behavioral modification strategies: increasing lean protein intake, decreasing simple carbohydrates, increasing vegetables, increasing water intake, meal planning and cooking strategies, keeping healthy foods in the home, ways to avoid boredom eating, ways to avoid night time snacking, and planning for success.  Rhonda [name] has agreed to follow-up with our clinic in 4 weeks. She was informed of the importance of frequent follow-up visits to maximize her success with intensive lifestyle modifications for her multiple health conditions.   Objective:   Blood pressure 120/74, pulse 69, temperature 98.1 F (36.7 C), height 5' 10 (1.778 m), weight 275 lb (124.7 kg), SpO2 98%. Body mass index is 39.46 kg/m.  General: Cooperative, alert, well developed, in no acute distress. HEENT: Conjunctivae and lids unremarkable. Cardiovascular: Regular rhythm.  Lungs: Normal work of breathing. Neurologic: No focal deficits.   Lab Results  Component Value Date   CREATININE 0.71 03/17/2024   BUN 10 03/17/2024   NA 138 03/17/2024   K 4.4 03/17/2024   CL 105 03/17/2024   CO2 17 (L) 03/17/2024   Lab Results  Component Value Date   ALT 36 (H) 03/17/2024   AST 23 03/17/2024   ALKPHOS 71 03/17/2024   BILITOT 0.5 03/17/2024   Lab Results  Component Value Date   HGBA1C 5.1 03/17/2024   HGBA1C 5.3 09/02/2023   HGBA1C 5.0 01/24/2023   HGBA1C 5.0 10/04/2022   HGBA1C 4.9 04/02/2022   Lab Results  Component Value Date   INSULIN  13.0 03/17/2024   INSULIN  11.7 09/02/2023   INSULIN  8.8 01/24/2023   INSULIN  10.8 10/04/2022   INSULIN  17.1 04/02/2022   Lab Results  Component Value Date   TSH 2.170 10/07/2023   Lab Results  Component Value Date   CHOL 174 10/07/2023   HDL 41 10/07/2023    LDLCALC 113 (H) 10/07/2023   TRIG 112 10/07/2023   CHOLHDL 4.2 10/07/2023   Lab Results  Component Value Date   VD25OH 27.7 (L) 03/17/2024   VD25OH 35.3 09/02/2023   VD25OH 45.4 01/24/2023   Lab Results  Component Value Date   WBC 4.7 01/09/2018   HGB 14.5 01/09/2018   HCT 42.1 01/09/2018   MCV 89 01/09/2018   PLT 195 09/20/2016   No results found for: IRON, TIBC, FERRITIN  Attestation Statements:   Reviewed by clinician on day of visit: allergies, medications, problem list, medical history, surgical history, family history, social history, and previous encounter notes.  I have reviewed the above documentation for accuracy and completeness, and I agree with the above. -  Bellamy Judson d. Syrah Daughtrey, NP-C

## 2024-05-20 ENCOUNTER — Ambulatory Visit: Admitting: Licensed Clinical Social Worker

## 2024-05-20 ENCOUNTER — Encounter: Payer: Self-pay | Admitting: Licensed Clinical Social Worker

## 2024-05-20 ENCOUNTER — Other Ambulatory Visit: Payer: Self-pay

## 2024-05-20 DIAGNOSIS — R41840 Attention and concentration deficit: Secondary | ICD-10-CM | POA: Diagnosis not present

## 2024-05-20 NOTE — Progress Notes (Signed)
 [facility] Counselor/Therapist Progress Note  Patient ID: Rhonda [name], MRN: [id number]    Date: 05/20/24  Time Spent: 3:02  pm - 3:40 pm : 38 Minutes  Treatment Type: Individual Therapy.  Reported Symptoms: improving in work flow and utilizing strategies for time management have made things more manageable   Mental Status Exam: Appearance:  Well Groomed     Behavior: Appropriate  Motor: Normal  Speech/Language:  Normal Rate  Affect: Appropriate  Mood: normal  Thought process: normal  Thought content:   WNL  Sensory/Perceptual disturbances:   WNL  Orientation: oriented to person, place, and time/date  Attention: Good  Concentration: Good  Memory: WNL  Fund of knowledge:  Good  Insight:   Good  Judgment:  Good  Impulse Control: Good   Risk Assessment: Danger to Self:  No Self-injurious Behavior: No Danger to Others: No Duty to Warn:no Physical Aggression / Violence:No  Access to Firearms a concern: No  Gang Involvement:No   Subjective:   Rhonda [name] participated from office, via video, and consented to treatment. I discussed the limitations of evaluation and management by telemedicine and the availability of in person appointments. The patient expressed understanding and agreed to proceed.  Therapist participated from home office.  Rhonda [name] reviewed the events of the past week. Patient reports improved attention and focus since ADHD diagnosis. She is implementing strategies discussed in prior sessions, including reminders, timers, and compartmentalization of tasks, which she states have increased her sense of control. Patient also reports requesting her supervisor's feedback on an auto-reply email draft to help manage incoming requests that disrupt her workflow.     Interventions: Solution-Oriented/Positive Psychology and Insight-Oriented  Diagnosis:  Attention and concentration deficit  Psychiatric Treatment: No , N/A  Treatment  Plan:  Client Abilities/Strengths Rhonda [name] is open to sessions.  Patient is meeting goals ahead of schedule. Demonstrates insight and initiative in applying interventions. Technical issues occurred during this session; therapist waited after two failed connection attempts. Patient did not reconnect.  Support System: Family and Friends   Engineer, mining of Needs Rhonda [name] would like to learn strategies to manage her new ADHD diagnosis    Treatment Level Monthly  Symptoms  Accomplished   (Status: improved) Controlled   (Status: improved)  Goals:   Rhonda [name] Patient is progressing well with self-management strategies. Early achievement of workflow goals indicates positive response to interventions. Move to monthly check-ins. Therapist sent secure message to reschedule check-in for late October.   Target Date: 06/10/24 Frequency: Monthly  Progress: 0 Modality: individual    Therapist will provide referrals for additional resources as appropriate.  Therapist will provide psycho-education regarding Rhonda [name]'s diagnosis and corresponding treatment approaches and interventions. Licensed Clinical Mental Health Counselor, [name], [facility] will support the patient's ability to achieve the goals identified. will employ CBT, BA, Problem-solving, Solution Focused, Mindfulness,  coping skills, & other evidenced-based practices will be used to promote progress towards healthy functioning to help manage decrease symptoms associated with her diagnosis.   Reduce overall level, frequency, and intensity of the feelings of depression, anxiety and panic evidenced by decreased work flow imbalance due to attention and focus issus.   Verbally express understanding of the relationship between feelings of depression, anxiety and their impact on thinking patterns and behaviors. Verbalize an understanding of the role that distorted thinking plays in creating fears,  excessive worry, and ruminations.  Rhonda [name] participated in the creation of the treatment plan)   Jeret Goyer, LCMHC

## 2024-05-27 ENCOUNTER — Other Ambulatory Visit: Payer: Self-pay

## 2024-05-27 ENCOUNTER — Other Ambulatory Visit (HOSPITAL_COMMUNITY): Payer: Self-pay

## 2024-05-27 DIAGNOSIS — G4733 Obstructive sleep apnea (adult) (pediatric): Secondary | ICD-10-CM | POA: Diagnosis not present

## 2024-05-27 DIAGNOSIS — Z6841 Body Mass Index (BMI) 40.0 and over, adult: Secondary | ICD-10-CM | POA: Diagnosis not present

## 2024-05-27 DIAGNOSIS — E282 Polycystic ovarian syndrome: Secondary | ICD-10-CM | POA: Diagnosis not present

## 2024-05-27 DIAGNOSIS — E559 Vitamin D deficiency, unspecified: Secondary | ICD-10-CM | POA: Diagnosis not present

## 2024-05-31 ENCOUNTER — Other Ambulatory Visit: Payer: Self-pay | Admitting: Medical Genetics

## 2024-05-31 DIAGNOSIS — Z006 Encounter for examination for normal comparison and control in clinical research program: Secondary | ICD-10-CM

## 2024-06-01 ENCOUNTER — Ambulatory Visit: Admitting: Licensed Clinical Social Worker

## 2024-06-01 ENCOUNTER — Other Ambulatory Visit (HOSPITAL_COMMUNITY): Payer: Self-pay | Admitting: General Surgery

## 2024-06-05 ENCOUNTER — Ambulatory Visit (HOSPITAL_COMMUNITY)
Admission: RE | Admit: 2024-06-05 | Discharge: 2024-06-05 | Disposition: A | Discharge status: Home / Self Care | Source: Ambulatory Visit | Attending: General Surgery | Admitting: General Surgery

## 2024-06-05 ENCOUNTER — Encounter (HOSPITAL_COMMUNITY)
Admission: RE | Admit: 2024-06-05 | Discharge: 2024-06-05 | Disposition: A | Discharge status: Home / Self Care | Source: Ambulatory Visit | Attending: General Surgery | Admitting: General Surgery

## 2024-06-05 ENCOUNTER — Ambulatory Visit (HOSPITAL_COMMUNITY)
Admission: RE | Admit: 2024-06-05 | Discharge: 2024-06-05 | Disposition: A | Source: Ambulatory Visit | Attending: General Surgery | Admitting: General Surgery

## 2024-06-05 ENCOUNTER — Other Ambulatory Visit: Payer: Self-pay

## 2024-06-05 DIAGNOSIS — Z01818 Encounter for other preprocedural examination: Secondary | ICD-10-CM | POA: Diagnosis not present

## 2024-06-05 DIAGNOSIS — K224 Dyskinesia of esophagus: Secondary | ICD-10-CM | POA: Diagnosis not present

## 2024-06-09 ENCOUNTER — Telehealth: Admitting: Psychiatry

## 2024-06-09 ENCOUNTER — Encounter: Payer: Self-pay | Admitting: Psychiatry

## 2024-06-09 DIAGNOSIS — F3342 Major depressive disorder, recurrent, in full remission: Secondary | ICD-10-CM

## 2024-06-09 DIAGNOSIS — F411 Generalized anxiety disorder: Secondary | ICD-10-CM

## 2024-06-09 DIAGNOSIS — G4701 Insomnia due to medical condition: Secondary | ICD-10-CM | POA: Diagnosis not present

## 2024-06-09 DIAGNOSIS — F900 Attention-deficit hyperactivity disorder, predominantly inattentive type: Secondary | ICD-10-CM

## 2024-06-09 MED ORDER — LISDEXAMFETAMINE DIMESYLATE 20 MG PO CAPS: 20.0000 mg | ORAL_CAPSULE | Freq: Every morning | ORAL | Status: DC

## 2024-06-09 NOTE — Progress Notes (Unsigned)
 Virtual Visit via Video Note  I connected with Rhonda [name] on 06/09/24 at 10:30 AM EDT by a video enabled telemedicine application and verified that I am speaking with the correct person using two identifiers.  Location Provider Location : [facility] Patient Location : Work  Participants: Patient , Provider    I discussed the limitations of evaluation and management by telemedicine and the availability of in person appointments. The patient expressed understanding and agreed to proceed.    I discussed the assessment and treatment plan with the patient. The patient was provided an opportunity to ask questions and all were answered. The patient agreed with the plan and demonstrated an understanding of the instructions.   The patient was advised to call back or seek an in-person evaluation if the symptoms worsen or if the condition fails to improve as anticipated.                                                                                                   BH MD OP Progress Note  06/09/2024 10:55 AM Rhonda [name]  MRN:  [id number]  Chief Complaint:  Chief Complaint  Patient presents with   Follow-up   Depression   Anxiety   Medication Refill   Discussed the use of AI scribe software for clinical note transcription with the patient, who gave verbal consent to proceed.  History of Present Illness Rhonda [name] is a 38 year old Caucasian female, divorced, employed, lives in [place], has a history of MDD, GAD, insomnia, ADHD was evaluated by telemedicine today.  She reports ongoing difficulty with sleep, describing frequent awakenings during the night and often removing her CPAP mask unconsciously. Over the past month, she started using a CPAP device and continues to use Ambien  extended release for sleep. On a good night, she estimates she sleeps about 6 hours and is working on improving her sleep hygiene by avoiding television before bed and establishing a bedtime  routine.  She reports no current concerns with depression, anxiety, or mood problems. She describes her mood as stable and notes that work is busy but manageable. She reports no issues with her ADHD medication regimen and is currently taking Vyvanse . She denies any thoughts about hurting herself or others.  She is considering bariatric surgery and has an evaluation scheduled for the same.   Visit Diagnosis:    ICD-10-CM   1. Recurrent major depressive disorder, in full remission  F33.42     2. GAD (generalized anxiety disorder)  F41.1     3. Insomnia due to medical condition  G47.01    mood, OSA mild    4. Attention deficit hyperactivity disorder (ADHD), predominantly inattentive type  F90.0 lisdexamfetamine (VYVANSE ) 20 MG capsule      Past Psychiatric History: I have reviewed past psychiatric history from progress note on 11/05/2018.  Past Medical History:  Past Medical History:  Diagnosis Date   Depression    treated   PCOS (polycystic ovarian syndrome) 2008   Plantar fasciitis     Past Surgical History:  Procedure Laterality Date   CHOLECYSTECTOMY  Family Psychiatric History: I have reviewed family psychiatric history from my progress note on 11/05/2018.  Family History:  Family History  Problem Relation Age of Onset   High blood pressure Mother    Depression Father    Liver disease Father    Alcoholism Father    Alcohol abuse Father    Diabetes Paternal Grandmother    Bipolar disorder Maternal Aunt     Social History: I have reviewed social history from progress note on 11/05/2018. Social History   Socioeconomic History   Marital status: Divorced    Spouse name: Dorn   Number of children: 2   Years of education: Not on file   Highest education level: Bachelor's degree (e.g., BA, AB, BS)  Occupational History   Occupation: RN  Tobacco Use   Smoking status: Never   Smokeless tobacco: Never  Vaping Use   Vaping status: Never Used  Substance and  Sexual Activity   Alcohol use: Yes    Alcohol/week: 1.0 standard drink of alcohol    Types: 1 Glasses of wine per week    Comment: Occasional once a week.   Drug use: No   Sexual activity: Yes    Partners: Male    Birth control/protection: None, I.U.D.  Other Topics Concern   Not on file  Social History Narrative   Not on file   Social Drivers of Health   Financial Resource Strain: Low Risk  (06/19/2023)   Received from [facility]   Overall Financial Resource Strain (CARDIA)    Difficulty of Paying Living Expenses: Not very hard  Food Insecurity: No Food Insecurity (06/19/2023)   Received from [facility]   Hunger Vital Sign    Within the past 12 months, you worried that your food would run out before you got the money to buy more.: Never true    Within the past 12 months, the food you bought just didn't last and you didn't have money to get more.: Never true  Transportation Needs: No Transportation Needs (06/19/2023)   Received from [facility] - Transportation    In the past 12 months, has lack of transportation kept you from medical appointments or from getting medications?: No    Lack of Transportation (Non-Medical): No  Physical Activity: Inactive (11/05/2018)   Exercise Vital Sign    Days of Exercise per Week: 0 days    Minutes of Exercise per Session: 0 min  Stress: Stress Concern Present (11/05/2018)   [employer] of Occupational Health - Occupational Stress Questionnaire    Feeling of Stress : Rather much  Social Connections: Unknown (11/05/2018)   Social Connection and Isolation Panel    Frequency of Communication with Friends and Family: Not on file    Frequency of Social Gatherings with Friends and Family: Not on file    Attends Religious Services: More than 4 times per year    Active Member of Clubs or Organizations: No    Attends Banker Meetings: Never    Marital Status: Married     Allergies: No Known Allergies  Metabolic Disorder Labs: Lab Results  Component Value Date   HGBA1C 5.1 03/17/2024   No results found for: PROLACTIN Lab Results  Component Value Date   CHOL 174 10/07/2023   TRIG 112 10/07/2023   HDL 41 10/07/2023   CHOLHDL 4.2 10/07/2023   LDLCALC 113 (H) 10/07/2023   LDLCALC 75 04/02/2022   Lab Results  Component  Value Date   TSH 2.170 10/07/2023   TSH 1.680 01/24/2023    Therapeutic Level Labs: No results found for: LITHIUM No results found for: VALPROATE No results found for: CBMZ  Current Medications: Current Outpatient Medications  Medication Sig Dispense Refill   busPIRone  (BUSPAR ) 15 MG tablet Take 1 tablet (15 mg total) by mouth 2 (two) times daily. 180 tablet 1   hydrOXYzine  (VISTARIL ) 25 MG capsule Take 1-2 capsules (25-50 mg total) by mouth at bedtime as needed for sleep 180 capsule 0   levocetirizine (XYZAL) 5 MG tablet Take 5 mg by mouth every evening.     levonorgestrel  (MIRENA ) 20 MCG/24HR IUD 1 each by Intrauterine route once.     [START ON 06/16/2024] lisdexamfetamine (VYVANSE ) 20 MG capsule Take 1 capsule (20 mg total) by mouth in the morning.     Multiple Vitamin (MULTIVITAMIN) tablet Take 1 tablet by mouth daily.     Probiotic Product (PROBIOTIC-10 PO) Take by mouth.     propranolol  (INDERAL ) 20 MG tablet Take 1 tablet (20 mg total) by mouth 2 (two) times daily as needed 60 tablet 1   sertraline  (ZOLOFT ) 50 MG tablet Take 1 tablet (50 mg total) by mouth daily. 90 tablet 0   spironolactone  (ALDACTONE ) 100 MG tablet Take 1 tablet (100 mg total) by mouth daily. 90 tablet 3   Vitamin D , Ergocalciferol , (DRISDOL ) 1.25 MG (50000 UNIT) CAPS capsule Take 1 capsule (50,000 Units total) by mouth 2 (two) times a week. 8 capsule 0   zolpidem  (AMBIEN  CR) 6.25 MG CR tablet Take 1 tablet (6.25 mg total) by mouth at bedtime as needed for sleep. 30 tablet 2   No current facility-administered medications for this visit.      Musculoskeletal: Strength & Muscle Tone: UTA Gait & Station: Seated Patient leans: N/A  Psychiatric Specialty Exam: Review of Systems  Psychiatric/Behavioral:  Positive for sleep disturbance.     There were no vitals taken for this visit.There is no height or weight on file to calculate BMI.  General Appearance: Casual  Eye Contact:  Fair  Speech:  Normal Rate  Volume:  Normal  Mood:  Euthymic  Affect:  Congruent  Thought Process:  Goal Directed and Descriptions of Associations: Intact  Orientation:  Full (Time, Place, and Person)  Thought Content: Logical   Suicidal Thoughts:  No  Homicidal Thoughts:  No  Memory:  Immediate;   Fair Recent;   Fair Remote;   Fair  Judgement:  Fair  Insight:  Fair  Psychomotor Activity:  Normal  Concentration:  Concentration: Fair and Attention Span: Fair  Recall:  [employer] of Knowledge: Fair  Language: Fair  Akathisia:  No  Handed:  Left  AIMS (if indicated): not done  Assets:  Manufacturing systems engineer Desire for Improvement Housing Social Support Transportation Vocational/Educational  ADL's:  Intact  Cognition: WNL  Sleep:  improving   Screenings: GAD-7    Loss adjuster, chartered Office Visit from 04/08/2024 in [facility] Office Visit from 10/23/2023 in [facility] Office Visit from 08/27/2023 in [facility] Office Visit from 03/18/2023 in [facility] Office Visit from 01/23/2023 in [facility]  Total GAD-7 Score 3 4 5 4 12    PHQ2-9    Flowsheet Row Office Visit from 04/08/2024 in [facility] Office Visit from 10/23/2023 in [facility] Office Visit  from 08/27/2023 in [facility] Office Visit from 03/18/2023 in [facility] Office Visit from 01/23/2023 in [facility]  PHQ-2 Total Score 0 0 1 1 3   PHQ-9 Total Score -- -- -- 8 14   Flowsheet Row Office Visit from 04/08/2024 in [facility] Video Visit from 12/23/2023 in [facility] Office Visit from 10/23/2023 in [facility]  C-SSRS RISK CATEGORY No Risk No Risk No Risk     Assessment and Plan: Rhonda [name] is a 38 year old Caucasian female, presented for follow-up appointment, discussed assessment and plan as noted below.  1. Recurrent major depressive disorder, in full remission Currently denies any significant depression symptoms Continue BuSpar  15 mg twice daily Continue Zoloft  50 mg daily  2. GAD (generalized anxiety disorder)-stable Denies any significant anxiety symptoms. Continue Zoloft  50 mg daily  3. Insomnia due to medical condition-improving Currently trying to be compliant on CPAP device and reports sleep is improving although it has been hard to get adjusted to the mask. Continue Ambien  6.25 mg at bedtime as needed Continue hydroxyzine  25-50 mg at bedtime as needed  4. Attention deficit hyperactivity disorder (ADHD), predominantly inattentive type-stable Denies any attention/focus problems. Continue Vyvanse  20 mg daily Reviewed [place] PMP AWARxE  Follow-up Follow-up in clinic in 2 months or sooner in person.    Consent: Patient/Guardian gives verbal consent for treatment and assignment of benefits for services provided during this visit. Patient/Guardian expressed understanding and agreed to proceed.   This note was generated in part or whole with voice recognition software. Voice recognition is usually quite accurate but there are transcription errors that can and very often do occur. I apologize for any typographical errors that were not  detected and corrected.    Delno Blaisdell, MD 06/09/2024, 10:55 AM

## 2024-06-11 ENCOUNTER — Ambulatory Visit: Admitting: Sleep Medicine

## 2024-06-15 ENCOUNTER — Encounter: Payer: Self-pay | Admitting: Dietician

## 2024-06-15 ENCOUNTER — Encounter: Attending: General Surgery | Admitting: Dietician

## 2024-06-15 VITALS — Ht 69.0 in | Wt 280.5 lb

## 2024-06-15 DIAGNOSIS — E669 Obesity, unspecified: Secondary | ICD-10-CM | POA: Diagnosis present

## 2024-06-15 NOTE — Progress Notes (Signed)
 Nutrition Assessment for Bariatric Surgery: Pre-Surgery Behavioral and Nutrition Intervention Program   Medical Nutrition Therapy  Appt Start Time: 1008    End Time: 1107  Patient was seen on 06/15/2024 for Pre-Operative Nutrition Assessment. Purpose of todays visit  enhance perioperative outcomes along with a healthy weight maintenance   Referral stated Supervised Weight Loss (SWL) visits needed: 0  Pt completed visits.   Pt has cleared nutrition requirements.   Planned surgery: Sleeve Gastrectomy Pt expectation of surgery: to get as close to 200 lbs at possible  NUTRITION ASSESSMENT   Anthropometrics  Start weight at [facility]: 280.5 lbs (date: 06/15/2024) Height: 69 in BMI: 41.42 kg/m2    Clinical   Pharmacotherapy: History of weight loss medication used: all the injections: wegovy , [name]  (successful, but insurance stopped covering)  Medical hx: obesity, sleep apnea, kidney stones Medications: Vit D, multivitamin, xyzal (mostly in the spring), Ambien , vyvance, mirena , Zoloft , aldactone , inderal , vistaril   Labs: Vit D 27.7 Notable signs/symptoms: none noted Any previous deficiencies? No  Evaluation of Nutritional Deficiencies: Micronutrient Nutrition Focused Physical Exam: Hair: No issues observed Eyes: No issues observed Mouth: No issues observed Neck: No issues observed Nails: No issues observed Skin: No issues observed  Lifestyle & Dietary Hx  Current Physical Activity Recommendations state 150 minutes per week of moderate to vigorous movement including Cardio and 1-2 days of resistance activities as well as flexibility/balance activities:  Pts current physical activity: ADLs, with 0% recommendation reached   Sleep Hygiene: duration and quality: okay, recently diagnosed with sleep apnea, stating she just started with a CPAP machine, 5-6 hours per night  Current Patient Perceived Stress Level as stated by pt on a scale of 1-10:  6       Stress Management  Techniques: working on managing time better; counseling   According to the Dietary Guidelines for Americans Recommendation: equivalent 1.5-2 cups fruits per day, equivalent 2-3 cups vegetables per day and at least half all grains whole  Fruit servings per day (on average): 1, meeting 50% recommendation  Non-starchy vegetable servings per day (on average): 2, meeting 66-100% recommendation  Whole Grains per day (on average): 1  Number of meals missed/skipped per week out of 21: 3-4  24-Hr Dietary Recall First Meal: skip or built bar (protein), turkey sausage links Snack:  Second Meal: fast food combo or cafeteria (lean meat, vegetable, starch) Snack: sweet pastry from [employer] Third Meal: eating out when kids are on the go Snack: another helping of dinner or chips Beverages: diet soda, coffee (with creamer, sometimes sf syrup, water (60-90 oz)  Alcoholic beverages per week: 0-1 (2-3 drinks at a time)   Estimated Energy Needs Calories: 1500  NUTRITION DIAGNOSIS  Overweight/obesity (Carbon-3.3) related to past poor dietary habits and physical inactivity as evidenced by patient w/ planned sleeve surgery following dietary guidelines for continued weight loss.  NUTRITION INTERVENTION  Nutrition counseling (C-1) and education (E-2) to facilitate bariatric surgery goals.  Educated pt on micronutrient deficiencies post-surgery and behavioral/dietary strategies to start in order to mitigate that risk   Behavioral and Dietary Interventions Pre-Op Goals Reviewed with the Patient Nutrition: Healthy Eating Behaviors Switch to non-caloric, non-carbonated and non-caffeinated beverages such as  water, unsweetened tea, Crystal Light and zero calorie beverages (aim for 64 oz. per day) Cut out grazing between meals or at night  Find a protein shake you like Eat every 3-5 hours        Eliminate distractions while eating (TV, computer, reading, driving, texting) Take 79-69 minutes  to eat a meal  Decrease  high sugar foods/decrease high fat/fried foods Eliminate alcoholic beverages Increase protein intake (eggs, fish, chicken, yogurt) before surgery Eat non starchy vegetables 2 times a day 7 days a week Eat complex carbohydrates such as whole grains and fruits   Behavioral Modification: Physical Activity Increase my usual daily activity (use stairs, park farther, etc.) Engage in _______________________  activity  _______ minutes ______ times per week  Other:    _________________________________________________________________     Problem Solving I will think about my usual eating patterns and how to tweak them How can my friends and family support me Barriers to starting my changes Learn and understand appetite verses hunger   Healthy Coping Allow for ___________ activities per week to help me manage stress Reframe negative thoughts I will keep a picture of someone or something that is my inspiration & look at it daily   Monitoring  Weigh myself once a week  Measure my progress by monitoring how my clothes fit Keep a food record of what I eat and drink for the next ________ (time period) Take pictures of what I eat and drink for the next ________ (time period) Use an app to count steps/day for the next_______ (time period) Measure my progress such as increased energy and more restful sleep Monitor your acid reflux and bowel habits, are they getting better?   *Goals that are bolded indicate the pt would like to start working towards these  Handouts Provided Include  Bariatric Surgery handouts (Nutrition Visits, Pre Surgery Behavioral Change Goals, Protein Shakes Brands to Choose From, Vitamins & Mineral Supplementation)  Learning Style & Readiness for Change Teaching method utilized: Visual, Auditory, and hands on  Demonstrated degree of understanding via: Teach Back  Readiness Level: preparation Barriers to learning/adherence to lifestyle change: nothing identified  RD's Notes  for Next Visit    MONITORING & EVALUATION Dietary intake, weekly physical activity, body weight, and preoperative behavioral change goals   Next Steps  Pt has completed visits. No further supervised visits required/recommended. Patient is to follow up at [facility] for pre-op class >2 weeks prior to scheduled surgery.

## 2024-06-16 ENCOUNTER — Ambulatory Visit (INDEPENDENT_AMBULATORY_CARE_PROVIDER_SITE_OTHER): Admitting: Adult Health

## 2024-06-16 ENCOUNTER — Other Ambulatory Visit (HOSPITAL_COMMUNITY): Payer: Self-pay

## 2024-06-16 ENCOUNTER — Other Ambulatory Visit: Payer: Self-pay

## 2024-06-16 ENCOUNTER — Encounter (INDEPENDENT_AMBULATORY_CARE_PROVIDER_SITE_OTHER): Payer: Self-pay | Admitting: Adult Health

## 2024-06-16 VITALS — BP 116/69 | HR 72 | Temp 97.6°F | Ht 70.0 in | Wt 279.0 lb

## 2024-06-16 DIAGNOSIS — E669 Obesity, unspecified: Secondary | ICD-10-CM

## 2024-06-16 DIAGNOSIS — E559 Vitamin D deficiency, unspecified: Secondary | ICD-10-CM | POA: Diagnosis not present

## 2024-06-16 DIAGNOSIS — Z6841 Body Mass Index (BMI) 40.0 and over, adult: Secondary | ICD-10-CM

## 2024-06-16 DIAGNOSIS — E66812 Obesity, class 2: Secondary | ICD-10-CM

## 2024-06-16 DIAGNOSIS — R7989 Other specified abnormal findings of blood chemistry: Secondary | ICD-10-CM | POA: Diagnosis not present

## 2024-06-16 DIAGNOSIS — Z0000 Encounter for general adult medical examination without abnormal findings: Secondary | ICD-10-CM

## 2024-06-16 MED ORDER — VITAMIN D (ERGOCALCIFEROL) 1.25 MG (50000 UNIT) PO CAPS
50000.0000 [IU] | ORAL_CAPSULE | ORAL | 0 refills | Status: DC
  Filled 2024-06-16 – 2024-06-18 (×2): qty 8, 28d supply, fill #0

## 2024-06-16 NOTE — Progress Notes (Signed)
 WEIGHT SUMMARY AND BIOMETRICS  Vitals Temp: 97.6 F (36.4 C) BP: 116/69 Pulse Rate: 72 SpO2: 97 %   Anthropometric Measurements Height: 5' 10 (1.778 m) Weight: 279 lb (126.6 kg) BMI (Calculated): 40.03 Weight at Last Visit: 275 lb Weight Lost Since Last Visit: 0 Weight Gained Since Last Visit: 4 lb Starting Weight: 273 lb Total Weight Loss (lbs): 0 lb (0 kg)   Body Composition  Body Fat %: 44.2 % Fat Mass (lbs): 123.4 lbs Muscle Mass (lbs): 148 lbs Total Body Water (lbs): 103.6 lbs Visceral Fat Rating : 11   Other Clinical Data Fasting: no Labs: no Today's Visit #: 42 Starting Date: 01/09/18    Chief Complaint:   OBESITY Rhonda [name] is here to discuss her progress with her obesity treatment plan.  She is on the the Category 3 Plan and states she is following her eating plan approximately 25 % of the time.  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  She hope to under Metabolic Surgery late this year - 2025  Rhonda [name] is preparing for her pre-op eating plain prior to Metabolic Surgery. She currently drinks 2-3 diet sodas and 2 (8 oz) cups of coffee per day. Reviewed how to wean off caffeine to prepare for a successful procedure.  Subjective:   1. Vitamin D  deficiency  Latest Reference Range & Units 01/24/23 09:39 09/02/23 09:13 03/17/24 09:11  Vitamin D , 25-Hydroxy 30.0 - 100.0 ng/mL 45.4 35.3 27.7 (L)  (L): Data is abnormally low  She reports taking Ergocalciferol  twice weekly  2. Healthcare maintenance -Not currently on any antidiabetic therapy -Pulmonology recently attempted Zepbound  therapy for newly dx'd OSA- denied by [facility] -She has consultation with Bariatric  Surgeon next week -She has started wearing nightly CPAP, estimates to use 3.5 hours- she has been removing face mask off at 0100. -She completed nutrition appt and will have her Psychological evaluation on Monday 06/22/24 with Bariatric Psychiatrist. -She hope to under Metabolic Surgery late this year - 2025  3. Elevated serum creatinine  Latest Reference Range & Units 01/24/23 09:39 09/02/23 09:13 03/17/24 09:11  Creatinine 0.57 - 1.00 mg/dL 9.02 9.10 9.28   BP excellent at OV She is on daily Spironolactone  100mg   Assessment/Plan:   1. Vitamin D  deficiency (Primary) Refill  Vitamin D , Ergocalciferol , (DRISDOL ) 1.25 MG (50000 UNIT) CAPS capsule (Future) Take 1 capsule (50,000 Units total) by mouth 2 (two) times a week. Dispense: 8 capsule, Refills: 0 of 0 remaining   2. Healthcare maintenance Continue healthy eating and increase regular daily activity Reduce caffeine intake with the following titration: 2 weeks: 2 diet sodas and 1 cup coffee/day 1 week: 1 diet soda and 1 cup coffee/day 1 week: 1 cup coffee Then remain caffeine free  3. Elevated serum creatinine Avoid Nephrotoxic substances Monitor Labs  4. Obesity, current BMI 40.1  Rhonda [name] is currently in the action stage of change. As such, her goal is to continue with weight loss efforts. She has agreed to the Category 3 Plan.   Exercise goals: All adults should avoid inactivity. Some physical activity is better than none, and adults who participate in any amount of physical activity gain some health benefits. Adults should also include muscle-strengthening activities that involve all major muscle  groups on 2 or more days a week.  Behavioral modification strategies: increasing lean protein intake, decreasing simple carbohydrates, increasing vegetables, increasing water intake, decreasing eating out, no skipping meals, meal planning and cooking strategies, keeping healthy foods in the home, ways to avoid boredom eating, ways to  avoid night time snacking, better snacking choices, emotional eating strategies, and planning for success.  Rhonda [name] has agreed to follow-up with our clinic in 4 weeks. She was informed of the importance of frequent follow-up visits to maximize her success with intensive lifestyle modifications for her multiple health conditions.   Objective:   Blood pressure 116/69, pulse 72, temperature 97.6 F (36.4 C), height 5' 10 (1.778 m), weight 279 lb (126.6 kg), SpO2 97%. Body mass index is 40.03 kg/m.  General: Cooperative, alert, well developed, in no acute distress. HEENT: Conjunctivae and lids unremarkable. Cardiovascular: Regular rhythm.  Lungs: Normal work of breathing. Neurologic: No focal deficits.   Lab Results  Component Value Date   CREATININE 0.71 03/17/2024   BUN 10 03/17/2024   NA 138 03/17/2024   K 4.4 03/17/2024   CL 105 03/17/2024   CO2 17 (L) 03/17/2024   Lab Results  Component Value Date   ALT 36 (H) 03/17/2024   AST 23 03/17/2024   ALKPHOS 71 03/17/2024   BILITOT 0.5 03/17/2024   Lab Results  Component Value Date   HGBA1C 5.1 03/17/2024   HGBA1C 5.3 09/02/2023   HGBA1C 5.0 01/24/2023   HGBA1C 5.0 10/04/2022   HGBA1C 4.9 04/02/2022   Lab Results  Component Value Date   INSULIN  13.0 03/17/2024   INSULIN  11.7 09/02/2023   INSULIN  8.8 01/24/2023   INSULIN  10.8 10/04/2022   INSULIN  17.1 04/02/2022   Lab Results  Component Value Date   TSH 2.170 10/07/2023   Lab Results  Component Value Date   CHOL 174 10/07/2023   HDL 41 10/07/2023   LDLCALC 113 (H) 10/07/2023   TRIG 112 10/07/2023   CHOLHDL 4.2 10/07/2023   Lab Results  Component Value Date   VD25OH 27.7 (L) 03/17/2024   VD25OH 35.3 09/02/2023   VD25OH 45.4 01/24/2023   Lab Results  Component Value Date   WBC 4.7 01/09/2018   HGB 14.5 01/09/2018   HCT 42.1 01/09/2018   MCV 89 01/09/2018   PLT 195 09/20/2016   No results found for: IRON, TIBC, FERRITIN  Attestation Statements:    Reviewed by clinician on day of visit: allergies, medications, problem list, medical history, surgical history, family history, social history, and previous encounter notes.  I have reviewed the above documentation for accuracy and completeness, and I agree with the above. -  Rihan Schueler d. Vannie Hochstetler, NP-C

## 2024-06-17 ENCOUNTER — Ambulatory Visit: Admitting: Sleep Medicine

## 2024-06-18 ENCOUNTER — Other Ambulatory Visit (HOSPITAL_COMMUNITY): Payer: Self-pay

## 2024-06-18 DIAGNOSIS — F900 Attention-deficit hyperactivity disorder, predominantly inattentive type: Secondary | ICD-10-CM

## 2024-06-19 ENCOUNTER — Other Ambulatory Visit (HOSPITAL_COMMUNITY): Payer: Self-pay

## 2024-06-19 MED ORDER — LISDEXAMFETAMINE DIMESYLATE 20 MG PO CAPS
20.0000 mg | ORAL_CAPSULE | Freq: Every morning | ORAL | 0 refills | Status: DC
  Filled 2024-06-19: qty 90, 90d supply, fill #0

## 2024-06-22 ENCOUNTER — Ambulatory Visit (HOSPITAL_COMMUNITY): Admitting: Licensed Clinical Social Worker

## 2024-06-25 ENCOUNTER — Ambulatory Visit (INDEPENDENT_AMBULATORY_CARE_PROVIDER_SITE_OTHER): Admitting: Sleep Medicine

## 2024-06-25 ENCOUNTER — Other Ambulatory Visit (HOSPITAL_COMMUNITY): Payer: Self-pay

## 2024-06-25 ENCOUNTER — Encounter: Payer: Self-pay | Admitting: Sleep Medicine

## 2024-06-25 VITALS — BP 112/70 | HR 70 | Temp 98.5°F | Ht 69.0 in | Wt 280.6 lb

## 2024-06-25 DIAGNOSIS — G4733 Obstructive sleep apnea (adult) (pediatric): Secondary | ICD-10-CM | POA: Diagnosis not present

## 2024-06-25 DIAGNOSIS — L680 Hirsutism: Secondary | ICD-10-CM | POA: Diagnosis not present

## 2024-06-25 DIAGNOSIS — F411 Generalized anxiety disorder: Secondary | ICD-10-CM | POA: Diagnosis not present

## 2024-06-25 DIAGNOSIS — E282 Polycystic ovarian syndrome: Secondary | ICD-10-CM | POA: Diagnosis not present

## 2024-06-25 DIAGNOSIS — F3341 Major depressive disorder, recurrent, in partial remission: Secondary | ICD-10-CM | POA: Diagnosis not present

## 2024-06-25 DIAGNOSIS — F900 Attention-deficit hyperactivity disorder, predominantly inattentive type: Secondary | ICD-10-CM | POA: Diagnosis not present

## 2024-06-25 DIAGNOSIS — Z6841 Body Mass Index (BMI) 40.0 and over, adult: Secondary | ICD-10-CM | POA: Diagnosis not present

## 2024-06-25 DIAGNOSIS — Z1331 Encounter for screening for depression: Secondary | ICD-10-CM | POA: Diagnosis not present

## 2024-06-25 DIAGNOSIS — E559 Vitamin D deficiency, unspecified: Secondary | ICD-10-CM | POA: Diagnosis not present

## 2024-06-25 DIAGNOSIS — Z0000 Encounter for general adult medical examination without abnormal findings: Secondary | ICD-10-CM | POA: Diagnosis not present

## 2024-06-25 DIAGNOSIS — M25512 Pain in left shoulder: Secondary | ICD-10-CM | POA: Diagnosis not present

## 2024-06-25 MED ORDER — SPIRONOLACTONE 100 MG PO TABS
100.0000 mg | ORAL_TABLET | Freq: Two times a day (BID) | ORAL | 3 refills | Status: AC
  Filled 2024-06-25: qty 180, 90d supply, fill #0
  Filled 2024-09-18: qty 180, 90d supply, fill #1

## 2024-06-25 NOTE — Progress Notes (Signed)
 Name:Clementina ZIYON CEDOTAL MRN: [id number] DOB: [birth year]   CHIEF COMPLAINT:  HST F/U   HISTORY OF PRESENT ILLNESS:  Mrs. Rhonda [name] is a 38 y.o. w/ a h/o OSA, ADD, anxiety, depression and obesity who presents for CPAP F/U. Reports using CPAP therapy every night, which is confirmed by compliance data. She is currently using the Airfit F20 FFM, which is comfortable. Reports feeling a little more refreshed upon awakening with CPAP therapy.    EPWORTH SLEEP SCORE    01/30/2024   10:00 AM  Results of the Epworth flowsheet  Sitting and reading 1  Watching TV 1  Sitting, inactive in a public place (e.g. a theatre or a meeting) 0  As a passenger in a car for an hour without a break 2  Lying down to rest in the afternoon when circumstances permit 3  Sitting and talking to someone 0  Sitting quietly after a lunch without alcohol 0  In a car, while stopped for a few minutes in traffic 0  Total score 7    PAST MEDICAL HISTORY :   has a past medical history of Depression, PCOS (polycystic ovarian syndrome) (2008), and Plantar fasciitis.  has a past surgical history that includes Cholecystectomy. Prior to Admission medications   Medication Sig Start Date End Date Taking? Authorizing Provider  amphetamine -dextroamphetamine  (ADDERALL) 10 MG tablet Take 1 tablet (10 mg total) by mouth 2 (two) times daily. 12/22/23 03/10/24 Yes Eappen, Saramma, MD  busPIRone  (BUSPAR ) 15 MG tablet Take 1 tablet (15 mg total) by mouth 2 (two) times daily. 10/01/23  Yes Eappen, Saramma, MD  hydrOXYzine  (VISTARIL ) 25 MG capsule Take 1-2 capsules (25-50 mg total) by mouth at bedtime as needed for sleep 12/12/22  Yes Eappen, Saramma, MD  levocetirizine (XYZAL) 5 MG tablet Take 5 mg by mouth every evening.   Yes [provider]  levonorgestrel  (MIRENA ) 20 MCG/24HR IUD 1 each by Intrauterine route once.   Yes [provider]  Multiple Vitamin (MULTIVITAMIN) tablet Take 1 tablet by mouth daily.   Yes  [provider]  Probiotic Product (PROBIOTIC-10 PO) Take by mouth.   Yes [provider]  propranolol  (INDERAL ) 20 MG tablet Take 1 tablet (20 mg total) by mouth 2 (two) times daily as needed 02/26/22  Yes   sertraline  (ZOLOFT ) 50 MG tablet Take 1 tablet (50 mg total) by mouth daily. 11/05/23  Yes Danford, Rockie D, NP  spironolactone  (ALDACTONE ) 100 MG tablet Take 1 tablet (100 mg total) by mouth daily. 06/19/23  Yes   topiramate  (TOPAMAX ) 100 MG tablet Take 1 tablet (100 mg total) by mouth daily. 01/29/24  Yes Danford, Katy D, NP  Vitamin D , Ergocalciferol , (DRISDOL ) 1.25 MG (50000 UNIT) CAPS capsule Take 1 capsule (50,000 Units total) by mouth every 7 (seven) days. 01/29/24  Yes Danford, Rockie D, NP  zolpidem  (AMBIEN  CR) 6.25 MG CR tablet Take 1 tablet (6.25 mg total) by mouth at bedtime as needed for sleep. 01/20/24 04/19/24 Yes Eappen, Saramma, MD  buPROPion  ER (WELLBUTRIN  SR) 100 MG 12 hr tablet Take 100 mg by mouth. 06/18/23   [provider]  ELDERBERRY PO Take by mouth.    [provider]  fluconazole  (DIFLUCAN ) 150 MG tablet Take 1 tablet (150 mg total) by mouth every 3 (three) days as needed. Patient not taking: Reported on 01/30/2024 10/18/23   Vivienne Delon [name], PA-C  nitrofurantoin , macrocrystal-monohydrate, (MACROBID ) 100 MG capsule Take 1 capsule (100 mg total) by mouth 2 (  two) times daily. Patient not taking: Reported on 01/30/2024 11/20/23   Burnette, Jennifer M, PA-C  nystatin  cream (MYCOSTATIN ) Apply 1 Application topically 2 (two) times daily. 07/08/23   Vivienne Delon [name], PA-C  triamcinolone  cream (KENALOG ) 0.1 % Apply 1 Application topically 2 (two) times daily. 09/24/23   Gladis Elsie BROCKS, PA-C   No Known Allergies  FAMILY HISTORY:  family history includes Alcohol abuse in her father; Alcoholism in her father; Bipolar disorder in her maternal aunt; Depression in her father; Diabetes in her paternal grandmother; High blood pressure in her mother;  Liver disease in her father. SOCIAL HISTORY:  reports that she has never smoked. She has never used smokeless tobacco. She reports current alcohol use of about 1.0 standard drink of alcohol per week. She reports that she does not use drugs.   Review of Systems:  Gen:  Denies  fever, sweats, chills weight loss  HEENT: Denies blurred vision, double vision, ear pain, eye pain, hearing loss, nose bleeds, sore throat Cardiac:  No dizziness, chest pain or heaviness, chest tightness,edema, No JVD Resp:   No cough, -sputum production, -shortness of breath,-wheezing, -hemoptysis,  Gi: Denies swallowing difficulty, stomach pain, nausea or vomiting, diarrhea, constipation, bowel incontinence Gu:  Denies bladder incontinence, burning urine Ext:   Denies Joint pain, stiffness or swelling Skin: Denies  skin rash, easy bruising or bleeding or hives Endoc:  Denies polyuria, polydipsia , polyphagia or weight change Psych:   Denies depression, insomnia or hallucinations  Other:  All other systems negative  VITAL SIGNS: BP 112/70   Pulse 70   Temp 98.5 F (36.9 C)   Ht 5' 9 (1.753 m)   Wt 280 lb 9.6 oz (127.3 kg)   LMP  (LMP Unknown)   SpO2 99%   BMI 41.44 kg/m     Physical Examination:   General Appearance: No distress  EYES PERRLA, EOM intact.   NECK Supple, No JVD Pulmonary: normal breath sounds, No wheezing.  CardiovascularNormal S1,S2.  No m/r/g.   Abdomen: Benign, Soft, non-tender. Skin:   warm, no rashes, no ecchymosis  Extremities: normal, no cyanosis, clubbing. Neuro:without focal findings,  speech normal  PSYCHIATRIC: Mood, affect within normal limits.   ASSESSMENT AND PLAN  OSA Patient is using and benefiting from CPAP therapy. Counseled patient on increasing total sleep time to 7-8 hours per night. Discussed the consequences of untreated sleep apnea. Advised not to drive drowsy for safety of patient and others. Will follow up in 6 months.    Morbid obesity Currently  being evaluated for gastric sleeve procedure. Also counseled patient on diet and lifestyle modification.   Patient  satisfied with Plan of action and management. All questions answered  I spent a total of 22 minutes reviewing chart data, face-to-face evaluation with the patient, counseling and coordination of care as detailed above.    Kellene Mccleary, M.D.  Sleep Medicine [facility]

## 2024-06-29 ENCOUNTER — Ambulatory Visit (HOSPITAL_COMMUNITY): Admitting: Licensed Clinical Social Worker

## 2024-06-29 DIAGNOSIS — F4320 Adjustment disorder, unspecified: Secondary | ICD-10-CM

## 2024-06-29 NOTE — Progress Notes (Signed)
 Virtual Visit via Video Note  I connected with Rhonda [name] on 06/29/24 at  6:00 PM EST by a video enabled telemedicine application and verified that I am speaking with the correct person using two identifiers.  Location: Rhonda: Rhonda primary residence, [place] Provider: Clinician Virtual office, [place]   I discussed the limitations of evaluation and management by telemedicine and the availability of in person appointments. The Rhonda expressed understanding and agreed to proceed.   I discussed the assessment and treatment plan with the Rhonda. The Rhonda was provided an opportunity to ask questions and all were answered. The Rhonda agreed with the plan and demonstrated an understanding of the instructions.   Comprehensive Clinical Assessment (CCA) Note 06/29/2024 Rhonda [name] [id number]  Chief Complaint:  Chief Complaint  Rhonda presents with   BARIATRIC SCREENING   Visit Diagnosis:  Encounter Diagnosis  Name Primary?   Adjustment disorder, unspecified type Yes    Disposition:  Clinician sees no significant psychological factors that would hinder the success of bariatric surgery at time of assessment. Clinician supports Rhonda candidacy for Bariatric Surgery.   Rhonda reports realistic expectations post surgery, is aware of the pre and post surgical process, client reports that behavioral health diagnosis(es) are stable at time of assessment, client reports positive pre and post surgical support system, and client reports motivation to make positive change.    CCA Biopsychosocial Intake/Chief Complaint:  BARIATRIC SCREENING  Current Symptoms/Problems: Rhonda [name] is a 38 y.o. year old adult Rhonda reporting to [facility] for preliminary screening to determine bariatric surgery eligibility. Rhonda reports that they have tried several weight loss interventions in the past, including slim fast, low calorie, medical weight loss, GLP medication .  Rhonda Towe  [name] reports no significant medical concerns at time of assessment.  Rhonda reports history of depression, anxiety, ADHD, and insomnia managed by zoloft  50, buspirone  15, hydroxyzone 25, propranolol  20, ambien  CR 6.25. Pt reports that her medications are managed by her psychiatrist and that she feels the medications manage her mood and anxiety well.   Rhonda [name] denies SI, HI, or perceptual disturbances at time of assessment. Rhonda denies substance use issues at time of assessment. Rhonda [name]  reports that they are motivated to make positive changes to contribute to improved wellness and are seeking bariatric weight loss surgery as an intervention to support wellness goals.   Rhonda Reported Schizophrenia/Schizoaffective Diagnosis in Past: No   Strengths: Pt reports good family support; pt can manage working in high scress environment successfully  Preferences: Due to unsuccessful weight loss interventions in the past, pt is seeking bariatric weight loss surgery  Abilities: Pt has ability to set positive wellness goals for self; pt has the ability to work full time   Type of Services Rhonda Feels are Needed: Pt seeking bariatric weight loss surgery   Initial Clinical Notes/Concerns: depression, insomnia, anxiety, ADHD managed by psychiatrist. Pt using CPAP and medication:   Mental Health Symptoms Depression:  Difficulty Concentrating; Fatigue; Change in energy/activity; Weight gain/loss (gained around 50 lbs in the past year)   Duration of Depressive symptoms: Greater than two weeks   Mania:  None   Anxiety:   Worrying; Sleep; Fatigue; Difficulty concentrating; Restlessness   Psychosis:  None   Duration of Psychotic symptoms: No data recorded  Trauma:  Re-experience of traumatic event (twin pregnancy at 19 weeks. (11 years ago))   Obsessions:  None   Compulsions:  None   Inattention:  Avoids/dislikes activities that  require focus; Disorganized; Does not seem  to listen; Symptoms present in 2 or more settings; Forgetful; Loses things (procrastinate at home and at work.; zone out at times at home)   Hyperactivity/Impulsivity:  Feeling of restlessness; Difficulty waiting turn   Oppositional/Defiant Behaviors:  Temper (when triggered)   Emotional Irregularity:  No data recorded  Other Mood/Personality Symptoms:  pt reports that mood, attention/focus, anxiety levels, and insomnia sympotms well managed by current interventions    Mental Status Exam Appearance and self-care  Stature:  Average   Weight:  Obese   Clothing:  Neat/clean   Grooming:  Normal   Cosmetic use:  Age appropriate   Posture/gait:  Normal   Motor activity:  Not Remarkable   Sensorium  Attention:  Normal   Concentration:  Normal   Orientation:  X5   Recall/memory:  Normal   Affect and Mood  Affect:  Appropriate   Mood:  Other (Comment) (within normal limits)   Relating  Eye contact:  Normal   Facial expression:  Responsive   Attitude toward examiner:  Cooperative   Thought and Language  Speech flow: Clear and Coherent   Thought content:  Appropriate to Mood and Circumstances   Preoccupation:  None   Hallucinations:  None   Organization:  No data recorded (coherent; goal directed)  Company Secretary of Knowledge:  Good   Intelligence:  Above Average   Abstraction:  Normal   Judgement:  Good   Reality Testing:  Realistic   Insight:  Good   Decision Making:  Normal   Social Functioning  Social Maturity:  Responsible   Social Judgement:  Normal   Stress  Stressors:  Family conflict; Housing; Surveyor, Quantity; Transitions; Work (with ex husband/ex husband's family; recent move in august. pt worrying about debt--pt recognizes overspending at times)   Coping Ability:  Normal   Skill Deficits:  None   Supports:  Friends/Service system; Family; Church (boyfriend and mom)     Religion: Religion/Spirituality Are You A Religious  Person?: Yes How Might This Affect Treatment?: pt reports no religious barriers to treatment  Leisure/Recreation: Leisure / Recreation Do You Have Hobbies?: Yes Leisure and Hobbies: no time for hobbies. kids every other week. work every other weekend.  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?: No  Education: Education Is Rhonda Currently Attending School?: No Last Grade Completed: 12 Did Garment/textile Technologist From [employer]?: Yes Did Theme Park Manager?: Yes What Type of College Degree Do you Have?: Master degree in nursing Did You Attend Graduate School?: Yes What is Your Post Graduate Degree?: Master Nursing What Was Your Major?: Nursing management Did You Have An Individualized Education Program (IIEP): No Did You Have Any Difficulty At School?: No Rhonda's Education Has Been Impacted by Current Illness: No  CCA Family/Childhood History Family and Relationship History: Family history Marital status: Divorced What types of issues is Rhonda dealing with in the relationship?: Pt reports that she went through difficult divorce situation Does Rhonda have children?: Yes How many children?: 2 How is Rhonda's relationship  with their children?: Pt reports positive relationship with children. Pt has children every other week/weekend.  Childhood History:  Childhood History By whom was/is the Rhonda raised?: Both parents Additional childhood history information: Pts parents divorced when pt in college Description of Rhonda's relationship with caregiver when they were a child: Pt reports positive relationship with parents as a child/teen Rhonda's description of current relationship with people who raised him/her: pt has positive relationship with both parents. Pt doesn't talk with father as much--strained for a few years. Father is alcoholic. How were you disciplined when you got in trouble as a child/adolescent?: pt reprots fair discipline Does Rhonda have siblings?: Yes Number of Siblings: 1 Description of Rhonda's current relationship with siblings: relationship: 18 months apart.  high conflict througout childhood--close friends currently. Did Rhonda suffer any verbal/emotional/physical/sexual abuse as a child?: No Did Rhonda suffer from severe childhood neglect?: No Has Rhonda ever been sexually abused/assaulted/raped as an adolescent or adult?: No Was the Rhonda ever a victim of a crime or a disaster?: No Witnessed domestic violence?: No Has Rhonda been affected by domestic violence as an adult?: No  Child/Adolescent Assessment:     CCA Substance Use Alcohol/Drug Use: Alcohol / Drug Use Pain Medications: SEE MAR Prescriptions: SEE MAR Over the Counter: SEE MAR History of alcohol / drug use?: No history of alcohol / drug abuse Longest period of sobriety (when/how long): NONE.  SOCIAL ETOH--3-4 2 X PER MONTH Negative Consequences of Use:  (none) Withdrawal Symptoms: None    ASAM's:  Six Dimensions of Multidimensional Assessment  Dimension 1:  Acute Intoxication and/or Withdrawal Potential:   Dimension 1:  Description of individual's past and current experiences of substance use and  withdrawal: social ETOH--pt denies using any other substances  Dimension 2:  Biomedical Conditions and Complications:   Dimension 2:  Description of Rhonda's biomedical conditions and  complications: none  Dimension 3:  Emotional, Behavioral, or Cognitive Conditions and Complications:  Dimension 3:  Description of emotional, behavioral, or cognitive conditions and complications: none  Dimension 4:  Readiness to Change:  Dimension 4:  Description of Readiness to Change criteria: none  Dimension 5:  Relapse, Continued use, or Continued Problem Potential:  Dimension 5:  Relapse, continued use, or continued problem potential critiera description: none  Dimension 6:  Recovery/Living Environment:  Dimension 6:  Recovery/Iiving environment criteria description: none  ASAM Severity Score: ASAM's Severity Rating Score: 0  ASAM Recommended Level of Treatment: ASAM Recommended Level of Treatment: Level I Outpatient Treatment   Substance use Disorder (SUD) Substance Use Disorder (SUD)  Checklist Symptoms of Substance Use:  (none)  Recommendations for Services/Supports/Treatments: Recommendations for Services/Supports/Treatments Recommendations For Services/Supports/Treatments: Individual Therapy  DSM5 Diagnoses: Rhonda Active Problem List   Diagnosis Date Noted   Polyphagia 08/05/2023   Attention deficit hyperactivity disorder (ADHD), predominantly inattentive type 01/23/2023   High risk medication use 01/23/2023   Elevated serum creatinine 04/12/2022   Health care maintenance 03/08/2022   Attention and concentration deficit 01/04/2022   Transaminitis 08/29/2021   Diarrhea 07/04/2021   Elevated liver enzymes 08/02/2020   Recurrent major depressive disorder, in full remission 12/31/2019   Numbness and tingling in left hand 10/15/2019   MDD (major depressive disorder), recurrent, in partial  remission 10/01/2019   Cubital tunnel syndrome, left 09/07/2019   Rotator cuff tendinitis, left  09/07/2019   Insomnia due to mental condition 06/23/2019   MDD (major depressive disorder), recurrent episode, mild 05/04/2019   GAD (generalized anxiety disorder) 05/04/2019   Vitamin D  deficiency 10/28/2018   Insulin  resistance 10/28/2018   Class 2 severe obesity with serious comorbidity and body mass index (BMI) of 39.0 to 39.9 in adult 10/28/2018   Gestational hypertension 09/18/2016   Pregnancy 05/07/2016   Shingles 04/17/2016   First trimester screening 03/19/2016   Family history of congenital heart defect    Spontaneous abortion in second trimester 10/24/2012   Anxiety and depression 12/05/2011   History of kidney stones 12/05/2011   PCOS (polycystic ovarian syndrome) 12/05/2011    Rhonda Centered Plan: Rhonda is on the following Treatment Plan(s):   Behavioral Health Assessment  Rhonda [name] Date of Birth:  1986-05-16 Age:  38 y.o. Date of Interview:  06/29/24 Gender:  female   Date of Report : 06/29/24 Purpose:   Bariatric/Weight-loss Surgery (pre-operative evaluation)    Assessment Instruments:  DSM-5-TR Self-Rated Level 1 Cross-Cutting Symptom Measure--Adult Severity Measure for Generalized Anxiety Disorder--Adult EAT-26 (Eating Attitudes Test) SSS-8 (Somatic Symptom Scale) BES (Binge Eating Scale)  Chief Complaint: BARIATRIC SCREENING  Client Background: Rhonda is a 38 yo female seeking weight loss surgery. 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  Patients marital status is divorced with two children.   The Rhonda is 5 feet  9 inches tall and 275 lbs., reflecting a BMI of 40.6 classifying Rhonda in the obese range and at further risk of co-morbid diseases.  Tobacco Use: Rhonda denies tobacco use.   Rhonda BEHAVIORAL ASSESSMENT SCORES  Personal History of Mental Illness: Rhonda denies treatment for depression and anxiety.   Mental Status Examination: Rhonda  was oriented x5 (person, place, situation, time, and object). Rhonda was appropriately groomed, and neatly dressed. Rhonda was alert, engaged, pleasant, and cooperative. Rhonda denies suicidal and homicidal ideations or any perceptual disturbances. Rhonda denies self-injury.   DSM-5-TR Self-Rated Level 1 Cross-Cutting Symptom Measure--Adult: Rhonda completed 23-item questionnaire assessing symptoms related to depression, anger, mania, anxiety, somatic symptoms, suicidal ideation, psychosis, insomnia, memory concerns, repetitive behaviors, dissociation, personality functioning and substance use. Rhonda [name] scored 1 in INSOMNIA domain.   Severity Measure for Generalized Anxiety Disorder--Adult: Rhonda completed a 10-item  scale. Total scores can range from 0 to 40. A raw score is calculated by summing the answer to each question, and an average total score is achieved by dividing the raw score by the number of items (e.g., 10) Raw-T score scoring conversion: 7-15 none to slight, 16-19 mild, 20-27 moderate, 28+ severe.  Rhonda [name] had a total raw score of 4 out of 40 which indicates NONE TO SLIGHT anxiety.   EAT-26: The EAT-26 is a twenty-six-question screening tool to identify symptoms of dieting behaviors, bulimia, food preoccupation and oral control.  Scores below a 20 are considered not meeting criteria for disordered eating. Rhonda [name] scored 11 out of 26. Rhonda denies inducing vomiting, or intentional meal skipping. Rhonda denies binge eating behaviors. Rhonda denies laxative abuse. Rhonda does not meet criteria for a DSM-V eating disorder.  SSS-8: The SSS-8, or Somatic Symptom Scale-8, is a brief self-report questionnaire used to assess the perceived burden of common somatic (physical) symptoms.  (SSS-8) is scored by summing the responses to eight items,  each rated on a 5-point Likert scale from 0 (Not at all) to 4 (Very much). Total scores range from 0 to 32, with  higher scores indicating greater somatic symptom burden. 0-3 indicate minimal burden, 4-7 indicate low burden, 8-11 indicate medium burden, 12-15 indicate high burden, 16-32 indicate very high burden.  Rhonda [name] scored 6 out of 32, which indicates LOW BURDEN score.   BES: The Binge Eating Scale (BES) is a self-report questionnaire developed to assess the presence and severity of binge eating behavior, particularly in individuals who may be struggling with obesity or disordered eating patterns.  Possible total scores range from 0 to 32 with higher scores indicating more severe binge eating symptoms. Based on the BES total score, individuals can be categorized into three groups according to established cut-scores of binge eating severity.  These groups are characterized by no binge eating (score <= 17), mild to moderate binge eating (score of 18 - 26) and severe binge eating (score >= 27). A frequent convention is to use the BES as a screening measure to classify all participants with scores greater than or equal to 17 as "binge eaters.SABRA Manuelita RAMAN Fath scored 0 out of 32, which indicates NO BINGE EATING score.   Conclusion & Recommendations:   Health history and current assessment reflect that Rhonda is suitable to be a candidate for bariatric surgery. Rhonda understands the procedure, the risks associated with it, and the importance of post-operative holistic care (Physical, spiritual/values, relationships, and mental/emotional health) with access to resources for support as needed. The Rhonda has made an informed decision to proceed with procedure. The Rhonda is motivated and expressed understanding of the post-surgical requirements. Rhonda's psychological assessment will be valid from today's date for 6 months (12/27/2024). After that date, a follow-up appointment will be needed to re-evaluate the Rhonda's psychological status.   Clinician sees no significant psychological factors that would  hinder the success of bariatric surgery at time of assessment. Clinician supports Rhonda candidacy for Bariatric Surgery.   Tawni JONELLE Brisker, MSW, LCSW Licensed Clinical Social [facility]    Referrals to Alternative Service(s): Referred to Alternative Service(s):   Place:   Date:   Time:    Referred to Alternative Service(s):   Place:   Date:   Time:    Referred to Alternative Service(s):   Place:   Date:   Time:    Referred to Alternative Service(s):   Place:   Date:   Time:      Collaboration of Care: Other Clinician recommends pt to continue ongoing support of psychiatrist re: medication management of behavioral health diagnoses and to seek psychotherapy services PRN. Pt reports that she has therapist that she has work with in the past and feels comfortable reaching out PRN. Pt encouraged to follow all ongoing recommendations of bariatric team members.   Rhonda/Guardian was advised Release of Information must be obtained prior to any record release in order to collaborate their care with an outside provider. Rhonda/Guardian was advised if they have not already done so to contact the registration department to sign all necessary forms in order for us  to release information regarding their care.   Consent: Rhonda/Guardian gives verbal consent for treatment and assignment of benefits for services provided during this visit. Rhonda/Guardian expressed understanding and agreed to proceed.   Titus Drone R Denali Sharma, LCSW

## 2024-07-14 ENCOUNTER — Encounter: Payer: Self-pay | Admitting: Dietician

## 2024-07-14 ENCOUNTER — Encounter: Attending: General Surgery | Admitting: Dietician

## 2024-07-14 VITALS — Ht 69.0 in | Wt 285.8 lb

## 2024-07-14 DIAGNOSIS — E669 Obesity, unspecified: Secondary | ICD-10-CM | POA: Diagnosis not present

## 2024-07-14 NOTE — Progress Notes (Signed)
 Pre-Operative Nutrition Class:    Class start Time: 0815   Class End Time: 0920  This was a class of 5 patients.   Patient was seen on 07/14/2024 for Pre-Operative Bariatric Surgery Education at the [facility]    Surgery date: 08/10/2024 Surgery type: Sleeve Gastrectomy  Anthropometrics  Start weight at [facility]: 280.5 lbs (date: 06/15/2024) Height: 69 in Weight today: 285.8 lbs BMI: 42.21 kg/m2    Clinical   Pharmacotherapy: History of weight loss medication used: all the injections: wegovy , [name]  (successful, but insurance stopped covering)  Medical hx: obesity, sleep apnea, kidney stones Medications: Vit D, multivitamin, xyzal (mostly in the spring), Ambien , vyvance, mirena , Zoloft , aldactone , inderal , vistaril   Labs: Vit D 27.7 Notable signs/symptoms: none noted Any previous deficiencies? No  Samples given per MNT protocol. Patient educated on appropriate usage: Celebrate Vitamins Multivitamin Lot # [id number] Exp: 03/2025  Celebrate Vitamins Calcium  Lot # [id number] Exp: 12/25  Ensure Max Protein Shake Lot # [id number] Exp: 1AUG2026  The following the learning objectives were met by the patient during this course: Identify Pre-Op Dietary Goals and will begin 2 weeks pre-operatively Identify appropriate sources of fluids and proteins  State protein recommendations and appropriate sources pre and post-operatively Identify Post-Operative Dietary Goals and will follow for 2 weeks post-operatively Identify appropriate multivitamin, calcium, and thiamin sources Describe the need for physical activity post-operatively and will follow MD recommendations State when to call healthcare provider regarding medication questions or post-operative complications When having a diagnosis of diabetes understanding hypoglycemia symptoms and the inclusion of 1 complex carbohydrate per meal  Handouts given during class include: Pre-Op Bariatric Surgery Diet  Handout Protein Shake Handout Post-Op Bariatric Surgery Nutrition Handout BELT Program Information Flyer [employer] Information Flyer [facility] Bariatric Supplements Price List  Follow-Up Plan: Patient will follow-up at [facility] 2 weeks post operatively for diet advancement per MD.

## 2024-07-21 ENCOUNTER — Other Ambulatory Visit: Payer: Self-pay

## 2024-07-21 NOTE — Progress Notes (Signed)
 Sent message, via epic in basket, requesting orders in epic from Careers adviser.

## 2024-07-22 ENCOUNTER — Encounter (INDEPENDENT_AMBULATORY_CARE_PROVIDER_SITE_OTHER): Payer: Self-pay | Admitting: Adult Health

## 2024-07-22 ENCOUNTER — Ambulatory Visit (INDEPENDENT_AMBULATORY_CARE_PROVIDER_SITE_OTHER): Payer: Self-pay | Admitting: Adult Health

## 2024-07-22 ENCOUNTER — Other Ambulatory Visit (HOSPITAL_COMMUNITY): Payer: Self-pay

## 2024-07-22 VITALS — BP 126/77 | HR 79 | Temp 98.7°F | Ht 70.0 in | Wt 282.0 lb

## 2024-07-22 DIAGNOSIS — R7989 Other specified abnormal findings of blood chemistry: Secondary | ICD-10-CM

## 2024-07-22 DIAGNOSIS — E559 Vitamin D deficiency, unspecified: Secondary | ICD-10-CM

## 2024-07-22 DIAGNOSIS — R632 Polyphagia: Secondary | ICD-10-CM | POA: Diagnosis not present

## 2024-07-22 DIAGNOSIS — Z6841 Body Mass Index (BMI) 40.0 and over, adult: Secondary | ICD-10-CM | POA: Diagnosis not present

## 2024-07-22 DIAGNOSIS — Z6839 Body mass index (BMI) 39.0-39.9, adult: Secondary | ICD-10-CM

## 2024-07-22 DIAGNOSIS — E669 Obesity, unspecified: Secondary | ICD-10-CM | POA: Diagnosis not present

## 2024-07-22 DIAGNOSIS — Z0000 Encounter for general adult medical examination without abnormal findings: Secondary | ICD-10-CM

## 2024-07-22 MED ORDER — VITAMIN D (ERGOCALCIFEROL) 1.25 MG (50000 UNIT) PO CAPS
50000.0000 [IU] | ORAL_CAPSULE | ORAL | 0 refills | Status: DC
  Filled 2024-07-22: qty 8, 28d supply, fill #0

## 2024-07-22 NOTE — Progress Notes (Signed)
 WEIGHT SUMMARY AND BIOMETRICS  Vitals Temp: 98.7 F (37.1 C) BP: 126/77 Pulse Rate: 79 SpO2: 99 %   Anthropometric Measurements Height: 5' 10 (1.778 m) Weight: 282 lb (127.9 kg) BMI (Calculated): 40.46 Weight at Last Visit: 279lb Weight Lost Since Last Visit: 0lb Weight Gained Since Last Visit: 3lb Starting Weight: 273lb Total Weight Loss (lbs): 0 lb (0 kg)   Body Composition  Body Fat %: 44.6 % Fat Mass (lbs): 125.8 lbs Muscle Mass (lbs): 148.4 lbs Total Body Water (lbs): 102.2 lbs Visceral Fat Rating : 12   Other Clinical Data Fasting: No Labs: No Today's Visit #: [id number] Starting Date: 01/09/18    Chief Complaint:   OBESITY Rhonda [name] is here to discuss her progress with her obesity treatment plan.  She is on the the Category 3 Plan and states she is following her eating plan approximately 25 % of the time.  She states she is exercising: None.  Interim History:   She will begin pre-operative eating plan 07/27/2024  She is scheduled for  GASTRECTOMY, SLEEVE, LAPAROSCOPIC N/A General  ENDOSCOPY, UPPER GI TRACT  08/10/2024   She anticipates 2 weeks off from FT position at [employer] [employer] [employer] She anticipates 4 weeks off from PT position at [employer]  She will be supported by her live in boyfriend.  Subjective:   1. Healthcare maintenance She will begin pre-operative eating plan 07/27/2024  She is scheduled for  GASTRECTOMY, SLEEVE, LAPAROSCOPIC N/A General  ENDOSCOPY, UPPER GI TRACT  08/10/2024   She anticipates 2 weeks off from FT position at [facility] [employer] She anticipates 4 weeks off from PT position at [employer]  She will be supported by her live in boyfriend.  2. Vitamin D  deficiency  Latest Reference Range & Units 01/24/23 09:39 09/02/23 09:13 03/17/24 09:11  Vitamin D , 25-Hydroxy 30.0 - 100.0 ng/mL 45.4 35.3 27.7 (L)  (L): Data is abnormally low  She is on bi-weekly Ergocalciferol - denies N/V/Muscle Weakness  3. Elevated serum creatinine   Latest Reference Range & Units 01/24/23 09:39 09/02/23 09:13 03/17/24 09:11  Creatinine 0.57 - 1.00 mg/dL 9.02 9.10 9.28   BP excellent at OV She has not been exercising regularly  4. Polyphagia She will begin pre operative eating plan next week. Reinforced the importance of compliance on eating plan per Bariatric Surgeon. She is not on any antidiabetic medications at present.  Assessment/Plan:   1. Healthcare maintenance Follow pre/peri/post operative care instructions per [facility]  2. Vitamin D  deficiency (Primary) Refill Vitamin D , Ergocalciferol , (DRISDOL ) 1.25 MG (50000 UNIT) CAPS capsule (Future) Take 1 capsule (50,000 Units total) by mouth 2 (two) times a week. Dispense: 8 capsule, Refills: 0 of 0 remaining   3. Elevated serum creatinine Avoid Nephrotoxic substances Monitor Labs  4. Polyphagia Follow pre/peri/post operative care instructions per [facility]  5. Obesity, current BMI 40.5  Rhonda [name] is not currently in the action stage of change. As such, her goal is to get back to weightloss efforts . She has agreed to Pre Operative Eating Plan per Surgeon's Instructions  Exercise goals: All adults should avoid inactivity. Some physical activity is better than none, and adults who participate in any amount of physical activity gain some health benefits. Adults should also include muscle-strengthening activities that involve all major muscle groups on 2 or more days a week.  Behavioral modification strategies: increasing lean protein intake, decreasing simple carbohydrates, increasing vegetables, increasing water intake, meal planning and cooking strategies, keeping healthy foods in the home,  and planning for success.  Rhonda [name] has agreed to follow-up with our clinic in 6 weeks. She was informed of the importance of frequent follow-up visits to maximize her success with intensive lifestyle modifications for her multiple health conditions.    Objective:   Blood pressure 126/77, pulse 79, temperature 98.7 F (37.1 C), height 5' 10 (1.778 m), weight 282 lb (127.9 kg), SpO2 99%. Body mass index is 40.46 kg/m.  General: Cooperative, alert, well developed, in no acute distress. HEENT: Conjunctivae and lids unremarkable. Cardiovascular: Regular rhythm.  Lungs: Normal work of breathing. Neurologic: No focal deficits.   Lab Results  Component Value Date   CREATININE 0.71 03/17/2024   BUN 10 03/17/2024   NA 138 03/17/2024   K 4.4 03/17/2024   CL 105 03/17/2024   CO2 17 (L) 03/17/2024   Lab Results  Component Value Date   ALT 36 (H) 03/17/2024   AST 23 03/17/2024   ALKPHOS 71 03/17/2024   BILITOT 0.5 03/17/2024   Lab Results  Component Value Date   HGBA1C 5.1 03/17/2024   HGBA1C 5.3 09/02/2023   HGBA1C 5.0 01/24/2023   HGBA1C 5.0 10/04/2022   HGBA1C 4.9 04/02/2022   Lab Results  Component Value Date   INSULIN  13.0 03/17/2024   INSULIN  11.7 09/02/2023   INSULIN  8.8 01/24/2023   INSULIN  10.8 10/04/2022   INSULIN  17.1 04/02/2022   Lab Results  Component Value Date   TSH 2.170 10/07/2023   Lab Results  Component Value Date   CHOL 174 10/07/2023   HDL 41 10/07/2023   LDLCALC 113 (H) 10/07/2023   TRIG 112 10/07/2023   CHOLHDL 4.2 10/07/2023   Lab Results  Component Value Date   VD25OH 27.7 (L) 03/17/2024   VD25OH 35.3 09/02/2023   VD25OH 45.4 01/24/2023   Lab Results  Component Value Date   WBC 4.7 01/09/2018   HGB 14.5 01/09/2018   HCT 42.1 01/09/2018   MCV 89 01/09/2018   PLT 195 09/20/2016   No results found for: IRON, TIBC, FERRITIN  Attestation Statements:   Reviewed by clinician on day of visit: allergies, medications, problem list, medical history, surgical history, family history, social history, and previous encounter notes.  I have reviewed the above documentation for accuracy and completeness, and I agree with the above. -  Rhonda Patella d. Williom Cedar, NP-C

## 2024-07-24 ENCOUNTER — Ambulatory Visit: Payer: Self-pay | Admitting: General Surgery

## 2024-07-24 DIAGNOSIS — R748 Abnormal levels of other serum enzymes: Secondary | ICD-10-CM

## 2024-07-24 DIAGNOSIS — Z6839 Body mass index (BMI) 39.0-39.9, adult: Secondary | ICD-10-CM

## 2024-07-24 DIAGNOSIS — Z01818 Encounter for other preprocedural examination: Secondary | ICD-10-CM

## 2024-07-28 ENCOUNTER — Other Ambulatory Visit: Payer: Self-pay

## 2024-07-28 ENCOUNTER — Other Ambulatory Visit (INDEPENDENT_AMBULATORY_CARE_PROVIDER_SITE_OTHER): Payer: Self-pay | Admitting: Adult Health

## 2024-07-28 ENCOUNTER — Encounter (HOSPITAL_COMMUNITY)
Admission: RE | Admit: 2024-07-28 | Discharge: 2024-07-28 | Disposition: A | Source: Ambulatory Visit | Attending: General Surgery

## 2024-07-28 ENCOUNTER — Encounter (HOSPITAL_COMMUNITY): Payer: Self-pay

## 2024-07-28 VITALS — BP 130/82 | HR 80 | Temp 98.8°F | Resp 18 | Ht 69.5 in | Wt 285.0 lb

## 2024-07-28 DIAGNOSIS — F419 Anxiety disorder, unspecified: Secondary | ICD-10-CM

## 2024-07-28 DIAGNOSIS — Z6839 Body mass index (BMI) 39.0-39.9, adult: Secondary | ICD-10-CM | POA: Diagnosis not present

## 2024-07-28 DIAGNOSIS — E66812 Obesity, class 2: Secondary | ICD-10-CM | POA: Diagnosis not present

## 2024-07-28 DIAGNOSIS — Z01818 Encounter for other preprocedural examination: Secondary | ICD-10-CM

## 2024-07-28 DIAGNOSIS — R748 Abnormal levels of other serum enzymes: Secondary | ICD-10-CM | POA: Diagnosis not present

## 2024-07-28 DIAGNOSIS — Z01812 Encounter for preprocedural laboratory examination: Secondary | ICD-10-CM | POA: Diagnosis not present

## 2024-07-28 HISTORY — DX: Personal history of urinary calculi: Z87.442

## 2024-07-28 HISTORY — DX: Attention-deficit hyperactivity disorder, unspecified type: F90.9

## 2024-07-28 HISTORY — DX: Anxiety disorder, unspecified: F41.9

## 2024-07-28 HISTORY — DX: Sleep apnea, unspecified: G47.30

## 2024-07-28 HISTORY — DX: Essential (primary) hypertension: I10

## 2024-07-28 LAB — CBC WITH DIFFERENTIAL/PLATELET
Abs Immature Granulocytes: 0.01 K/uL (ref 0.00–0.07)
Basophils Absolute: 0 K/uL (ref 0.0–0.1)
Basophils Relative: 1 %
Eosinophils Absolute: 0.2 K/uL (ref 0.0–0.5)
Eosinophils Relative: 2 %
HCT: 43 % (ref 36.0–46.0)
Hemoglobin: 14.6 g/dL (ref 12.0–15.0)
Immature Granulocytes: 0 %
Lymphocytes Relative: 23 %
Lymphs Abs: 1.4 K/uL (ref 0.7–4.0)
MCH: 31.1 pg (ref 26.0–34.0)
MCHC: 34 g/dL (ref 30.0–36.0)
MCV: 91.5 fL (ref 80.0–100.0)
Monocytes Absolute: 0.5 K/uL (ref 0.1–1.0)
Monocytes Relative: 8 %
Neutro Abs: 4.1 K/uL (ref 1.7–7.7)
Neutrophils Relative %: 66 %
Platelets: 340 K/uL (ref 150–400)
RBC: 4.7 MIL/uL (ref 3.87–5.11)
RDW: 12.5 % (ref 11.5–15.5)
WBC: 6.2 K/uL (ref 4.0–10.5)
nRBC: 0 % (ref 0.0–0.2)

## 2024-07-28 LAB — COMPREHENSIVE METABOLIC PANEL WITH GFR
ALT: 72 U/L — ABNORMAL HIGH (ref 0–44)
AST: 56 U/L — ABNORMAL HIGH (ref 15–41)
Albumin: 4.4 g/dL (ref 3.5–5.0)
Alkaline Phosphatase: 76 U/L (ref 38–126)
Anion gap: 11 (ref 5–15)
BUN: 8 mg/dL (ref 6–20)
CO2: 22 mmol/L (ref 22–32)
Calcium: 9.2 mg/dL (ref 8.9–10.3)
Chloride: 105 mmol/L (ref 98–111)
Creatinine, Ser: 0.84 mg/dL (ref 0.44–1.00)
GFR, Estimated: >60 mL/min (ref >60)
Glucose, Bld: 95 mg/dL (ref 70–99)
Potassium: 4.1 mmol/L (ref 3.5–5.1)
Sodium: 138 mmol/L (ref 135–145)
Total Bilirubin: 0.3 mg/dL (ref 0.0–1.2)
Total Protein: 7.8 g/dL (ref 6.5–8.1)

## 2024-07-28 LAB — TYPE AND SCREEN
ABO/RH(D): A POS
Antibody Screen: NEGATIVE

## 2024-07-28 NOTE — Progress Notes (Signed)
 COVID Vaccine received:  []  No [x]  Yes Date of any COVID positive Test in last 38 days:none  PCP - Jerona Sayre, MD, Edsel Pepper, PA-C at [facility] Cardiologist - none Pulmonary- Sleep Med- Devona Skiff, MD   Chest x-ray - 06-05-2024  2v  Epic EKG -  06-05-2024  Epic Stress Test -  ECHO -  Cardiac Cath -  CT Coronary Calcium score:   Pacemaker / ICD device [x]  No []  Yes   Spinal Cord Stimulator:[x]  No []  Yes       History of Sleep Apnea? []  No [x]  Yes  Sleep test 01-30-24 CPAP used?- []  No [x]  Yes    Medication on DOS: Sertraline  (Zoloft ), buspirone  (Buspar )  Hold DOS: lisdexamfetamine  (Vyvanse ), spironolactone ,   Patient has: [x]  NO Hx DM   []  Pre-DM   []  DM1  []   DM2 Does the patient monitor blood sugar?   [x]  N/A   []  No []  Yes  Last A1c was: 5.1  on  03-17-24     Blood Thinner / Instructions: None Aspirin Instructions:  none  Activity level: Able to walk up 2 flights of stairs without becoming significantly short of breath or having chest pain?  []  No   [x]    Yes  Patient can perform ADLs without assistance. []  No  [x]   Yes  Comments: She works as a theme park manager at [employer].   [employer] has been requested.   Anesthesia review: ADHD, (High risk meds) PCOS, MDD / GAD, ? LFTS, OSA- CPAP  Patient denies any S&S of respiratory illness or Covid - no shortness of breath, fever, cough or chest pain at PAT appointment.  Patient verbalized understanding and agreement to the Pre-Surgical Instructions that were given to them at this PAT appointment. Patient was also educated of the need to review these PAT instructions again prior to his/her surgery.I reviewed the appropriate phone numbers to call if they have any and questions or concerns.

## 2024-07-28 NOTE — Patient Instructions (Addendum)
 SURGICAL WAITING ROOM VISITATION Patients having surgery or a procedure may have no more than 2 support people in the waiting area - these visitors may rotate in the visitor waiting room.   If the patient needs to stay at the hospital during part of their recovery, the visitor guidelines for inpatient rooms apply.  PRE-OP VISITATION  Pre-op nurse will coordinate an appropriate time for 1 support person to accompany the patient in pre-op.  This support person may not rotate.  This visitor will be contacted when the time is appropriate for the visitor to come back in the pre-op area.  Temporary Visitor Restrictions   Children ages 80 and under will not be able to visit patients in [facility] under most circumstances. Visitation is not restricted outside of hospitals unless noted otherwise in the [facility] and Location Specific Visitation Guidelines at :       [email or web address]  Visitors with respiratory illnesses are discouraged from visiting and should remain at home.  You are not required to quarantine at this time prior to your surgery. However, you must do this: Hand Hygiene often Do NOT share personal items Notify your provider if you are in close contact with someone who has COVID or you develop fever 100.4 or greater, new onset of sneezing, cough, sore throat, shortness of breath or body aches.  If you test positive for Covid or have been in contact with anyone that has tested positive in the last 10 days please notify you surgeon.    Your procedure is scheduled on:  MONDAY  08-10-2024  Report to [facility] Main Entrance: [address] entrance where the [employer] is available.   Report to admitting at:05:15     AM  Call this number if you have any questions or problems the morning of surgery [phone]  FOLLOW ANY ADDITIONAL PRE OP INSTRUCTIONS YOU RECEIVED FROM YOUR SURGEON'S OFFICE!!!  Do not eat food after Midnight the night prior to your  surgery/procedure.  After Midnight you may have the following liquids until  04:30 AM OF SURGERY  Clear Liquid Diet Water Black Coffee (sugar ok, NO MILK/CREAM OR CREAMERS)  Tea (sugar ok, NO MILK/CREAM OR CREAMERS) regular and decaf                             Plain Jell-O  with no fruit (NO RED)                                           Fruit ices (not with fruit pulp, NO RED)                                     Popsicles (NO RED)                                                                  Juice: NO CITRUS JUICES: only apple, WHITE grape, WHITE cranberry Sports drinks like Gatorade or Powerade (NO RED)  The day of surgery:  Drink ONE (1) Pre-Surgery G2 at   04:30   AM the morning of surgery. Drink in one sitting. Do not sip.  This drink was given to you during your hospital pre-op appointment visit. Nothing else to drink after completing the Pre-Surgery G2 : No candy, chewing gum or throat lozenges.     Oral Hygiene is also important to reduce your risk of infection.        Remember - BRUSH YOUR TEETH THE MORNING OF SURGERY WITH YOUR REGULAR TOOTHPASTE  Do NOT smoke after Midnight the night before surgery.  STOP TAKING all Vitamins, Herbs and supplements 1 week before your surgery.   Take ONLY these medicines the morning of surgery with A SIP OF WATER: Sertraline  (Zoloft ), buspirone  (Buspar )   DO NOT TAKE lisdexamfetamine (Vyvanse ), spironolactone , the morning of your surgery.   If You have been diagnosed with Sleep Apnea - Bring CPAP mask and tubing day of surgery. We will provide you with a CPAP machine on the day of your surgery.                   You may not have any metal on your body including hair pins, jewelry, and body piercing  Do not wear make-up, lotions, powders, perfumes or deodorant  Do not wear nail polish including gel and S&S, artificial / acrylic nails, or any other type of covering on natural nails including finger and toenails. If  you have artificial nails, gel coating, etc., that needs to be removed by a nail salon, Please have this removed prior to surgery. Not doing so may mean that your surgery could be cancelled or delayed if the Surgeon or anesthesia staff feels like they are unable to monitor you safely.   Do not shave 48 hours prior to surgery to avoid nicks in your skin which may contribute to postoperative infections.   Contacts, Hearing Aids, dentures or bridgework may not be worn into surgery. DENTURES WILL BE REMOVED PRIOR TO SURGERY PLEASE DO NOT APPLY Poly grip OR ADHESIVES!!!  You may bring a small overnight bag with you on the day of surgery, only pack items that are not valuable. [facility] IS NOT RESPONSIBLE   FOR VALUABLES THAT ARE LOST OR STOLEN.   Do not bring your home medications to the hospital. The Pharmacy will dispense medications listed on your medication list to you during your admission in the Hospital.  Please read over the following fact sheets you were given: IF YOU HAVE QUESTIONS ABOUT YOUR PRE-OP INSTRUCTIONS, PLEASE CALL [phone]   [facility] - Preparing for Surgery        Before surgery, you can play an important role.  Because skin is not sterile, your skin needs to be as free of germs as possible.  You can reduce the number of germs on your skin by washing with CHG (chlorahexidine gluconate) soap before surgery.  CHG is an antiseptic cleaner which kills germs and bonds with the skin to continue killing germs even after washing. Please DO NOT use if you have an allergy to CHG or antibacterial soaps.  If your skin becomes reddened/irritated stop using the CHG and inform your nurse when you arrive at Short Stay. Do not shave (including legs and underarms) for at least 48 hours prior to the first CHG shower.  You may shave your face/neck.  Please follow these instructions carefully:  1.  Shower with CHG Soap the night before surgery ONLY (  DO NOT USE THE CHG SOAP THE MORNING OF  SURGERY).  2.  If you choose to wash your hair, wash your hair first as usual with your normal  shampoo.  3.  After you shampoo, rinse your hair and body thoroughly to remove the shampoo.                             4.  Use CHG as you would any other liquid soap.  You can apply chg directly to the skin and wash.  Gently with a scrungie or clean washcloth.  5.  Apply the CHG Soap to your body ONLY FROM THE NECK DOWN.   Do not use on face/ open                           Wound or open sores. Avoid contact with eyes, ears mouth and genitals (private parts).                       Wash face,  Genitals (private parts) with your normal soap.             6.  Wash thoroughly, paying special attention to the area where your  surgery  will be performed.  7.  Thoroughly rinse your body with warm water from the neck down.  8.  DO NOT shower/wash with your normal soap after using and rinsing off the CHG Soap.                9.  Pat yourself dry with a clean towel.            10.  Wear clean pajamas.            11.  Place clean sheets on your bed the night of your first shower and do not  sleep with pets.  Day of Surgery : Do not apply any CHG, lotions/deodorants the morning of surgery.  Please wear clean clothes to the hospital/surgery center.   FAILURE TO FOLLOW THESE INSTRUCTIONS MAY RESULT IN THE CANCELLATION OF YOUR SURGERY  PATIENT SIGNATURE_________________________________  NURSE SIGNATURE__________________________________  ________________________________________________________________________      Nasario Exon    An incentive spirometer is a tool that can help keep your lungs clear and active. This tool measures how well you are filling your lungs with each breath. Taking long deep breaths may help reverse or decrease the chance of developing breathing (pulmonary) problems (especially infection) following: A long period of time when you are unable to move or be active. BEFORE  THE PROCEDURE  If the spirometer includes an indicator to show your best effort, your nurse or respiratory therapist will set it to a desired goal. If possible, sit up straight or lean slightly forward. Try not to slouch. Hold the incentive spirometer in an upright position. INSTRUCTIONS FOR USE  Sit on the edge of your bed if possible, or sit up as far as you can in bed or on a chair. Hold the incentive spirometer in an upright position. Breathe out normally. Place the mouthpiece in your mouth and seal your lips tightly around it. Breathe in slowly and as deeply as possible, raising the piston or the ball toward the top of the column. Hold your breath for 3-5 seconds or for as long as possible. Allow the piston or ball to fall to the bottom of the column. Remove the  mouthpiece from your mouth and breathe out normally. Rest for a few seconds and repeat Steps 1 through 7 at least 10 times every 1-2 hours when you are awake. Take your time and take a few normal breaths between deep breaths. The spirometer may include an indicator to show your best effort. Use the indicator as a goal to work toward during each repetition. After each set of 10 deep breaths, practice coughing to be sure your lungs are clear. If you have an incision (the cut made at the time of surgery), support your incision when coughing by placing a pillow or rolled up towels firmly against it. Once you are able to get out of bed, walk around indoors and cough well. You may stop using the incentive spirometer when instructed by your caregiver.  RISKS AND COMPLICATIONS Take your time so you do not get dizzy or light-headed. If you are in pain, you may need to take or ask for pain medication before doing incentive spirometry. It is harder to take a deep breath if you are having pain. AFTER USE Rest and breathe slowly and easily. It can be helpful to keep track of a log of your progress. Your caregiver can provide you with a simple  table to help with this. If you are using the spirometer at home, follow these instructions: SEEK MEDICAL CARE IF:  You are having difficultly using the spirometer. You have trouble using the spirometer as often as instructed. Your pain medication is not giving enough relief while using the spirometer. You develop fever of 100.5 F (38.1 C) or higher.                                                                                                    SEEK IMMEDIATE MEDICAL CARE IF:  You cough up bloody sputum that had not been present before. You develop fever of 102 F (38.9 C) or greater. You develop worsening pain at or near the incision site. MAKE SURE YOU:  Understand these instructions. Will watch your condition. Will get help right away if you are not doing well or get worse. Document Released: 12/17/2006 Document Revised: 10/29/2011 Document Reviewed: 02/17/2007 [facility] Patient Information 2014 [place], [place].       WHAT IS A BLOOD TRANSFUSION? Blood Transfusion Information  A transfusion is the replacement of blood or some of its parts. Blood is made up of multiple cells which provide different functions. Red blood cells carry oxygen and are used for blood loss replacement. White blood cells fight against infection. Platelets control bleeding. Plasma helps clot blood. Other blood products are available for specialized needs, such as hemophilia or other clotting disorders. BEFORE THE TRANSFUSION  Who gives blood for transfusions?  Healthy volunteers who are fully evaluated to make sure their blood is safe. This is blood bank blood. Transfusion therapy is the safest it has ever been in the practice of medicine. Before blood is taken from a donor, a complete history is taken to make sure that person has no history of diseases nor engages in risky social behavior (examples are  intravenous drug use or sexual activity with multiple partners). The donor's travel history is  screened to minimize risk of transmitting infections, such as malaria. The donated blood is tested for signs of infectious diseases, such as HIV and hepatitis. The blood is then tested to be sure it is compatible with you in order to minimize the chance of a transfusion reaction. If you or a relative donates blood, this is often done in anticipation of surgery and is not appropriate for emergency situations. It takes many days to process the donated blood. RISKS AND COMPLICATIONS Although transfusion therapy is very safe and saves many lives, the main dangers of transfusion include:  Getting an infectious disease. Developing a transfusion reaction. This is an allergic reaction to something in the blood you were given. Every precaution is taken to prevent this. The decision to have a blood transfusion has been considered carefully by your caregiver before blood is given. Blood is not given unless the benefits outweigh the risks. AFTER THE TRANSFUSION Right after receiving a blood transfusion, you will usually feel much better and more energetic. This is especially true if your red blood cells have gotten low (anemic). The transfusion raises the level of the red blood cells which carry oxygen, and this usually causes an energy increase. The nurse administering the transfusion will monitor you carefully for complications. HOME CARE INSTRUCTIONS  No special instructions are needed after a transfusion. You may find your energy is better. Speak with your caregiver about any limitations on activity for underlying diseases you may have. SEEK MEDICAL CARE IF:  Your condition is not improving after your transfusion. You develop redness or irritation at the intravenous (IV) site. SEEK IMMEDIATE MEDICAL CARE IF:  Any of the following symptoms occur over the next 12 hours: Shaking chills. You have a temperature by mouth above 102 F (38.9 C), not controlled by medicine. Chest, back, or muscle pain. People  around you feel you are not acting correctly or are confused. Shortness of breath or difficulty breathing. Dizziness and fainting. You get a rash or develop hives. You have a decrease in urine output. Your urine turns a dark color or changes to pink, red, or brown. Any of the following symptoms occur over the next 10 days: You have a temperature by mouth above 102 F (38.9 C), not controlled by medicine. Shortness of breath. Weakness after normal activity. The white part of the eye turns yellow (jaundice). You have a decrease in the amount of urine or are urinating less often. Your urine turns a dark color or changes to pink, red, or brown. Document Released: 08/03/2000 Document Revised: 10/29/2011 Document Reviewed: 03/22/2008 [facility] Patient Information 2014 [place], [place].  _______________________________________________________________________

## 2024-07-29 DIAGNOSIS — G4733 Obstructive sleep apnea (adult) (pediatric): Secondary | ICD-10-CM | POA: Diagnosis not present

## 2024-07-29 DIAGNOSIS — E559 Vitamin D deficiency, unspecified: Secondary | ICD-10-CM | POA: Diagnosis not present

## 2024-07-29 DIAGNOSIS — E282 Polycystic ovarian syndrome: Secondary | ICD-10-CM | POA: Diagnosis not present

## 2024-07-29 DIAGNOSIS — R7401 Elevation of levels of liver transaminase levels: Secondary | ICD-10-CM | POA: Diagnosis not present

## 2024-07-29 DIAGNOSIS — Z6841 Body Mass Index (BMI) 40.0 and over, adult: Secondary | ICD-10-CM | POA: Diagnosis not present

## 2024-07-30 DIAGNOSIS — G4733 Obstructive sleep apnea (adult) (pediatric): Secondary | ICD-10-CM | POA: Diagnosis not present

## 2024-07-31 DIAGNOSIS — G4733 Obstructive sleep apnea (adult) (pediatric): Secondary | ICD-10-CM | POA: Diagnosis not present

## 2024-08-04 ENCOUNTER — Other Ambulatory Visit: Payer: Self-pay

## 2024-08-04 ENCOUNTER — Other Ambulatory Visit (HOSPITAL_COMMUNITY): Payer: Self-pay

## 2024-08-04 ENCOUNTER — Other Ambulatory Visit (INDEPENDENT_AMBULATORY_CARE_PROVIDER_SITE_OTHER): Payer: Self-pay | Admitting: Adult Health

## 2024-08-04 DIAGNOSIS — F419 Anxiety disorder, unspecified: Secondary | ICD-10-CM

## 2024-08-06 ENCOUNTER — Ambulatory Visit: Admitting: Psychiatry

## 2024-08-06 ENCOUNTER — Other Ambulatory Visit: Payer: Self-pay

## 2024-08-06 ENCOUNTER — Other Ambulatory Visit (HOSPITAL_COMMUNITY): Payer: Self-pay

## 2024-08-06 VITALS — BP 134/94 | HR 91 | Temp 97.6°F | Ht 69.5 in | Wt 286.0 lb

## 2024-08-06 DIAGNOSIS — F411 Generalized anxiety disorder: Secondary | ICD-10-CM | POA: Diagnosis not present

## 2024-08-06 DIAGNOSIS — G4701 Insomnia due to medical condition: Secondary | ICD-10-CM | POA: Insufficient documentation

## 2024-08-06 DIAGNOSIS — F900 Attention-deficit hyperactivity disorder, predominantly inattentive type: Secondary | ICD-10-CM

## 2024-08-06 DIAGNOSIS — F3342 Major depressive disorder, recurrent, in full remission: Secondary | ICD-10-CM | POA: Diagnosis not present

## 2024-08-06 MED ORDER — ZOLPIDEM TARTRATE 5 MG PO TABS
5.0000 mg | ORAL_TABLET | Freq: Every evening | ORAL | 1 refills | Status: AC | PRN
  Filled 2024-08-06 (×2): qty 30, 30d supply, fill #0
  Filled 2024-09-18: qty 30, 30d supply, fill #1

## 2024-08-06 MED ORDER — SERTRALINE HCL 50 MG PO TABS
50.0000 mg | ORAL_TABLET | Freq: Every day | ORAL | 0 refills | Status: AC
  Filled 2024-08-06: qty 90, 90d supply, fill #0

## 2024-08-06 NOTE — Progress Notes (Unsigned)
 [facility] MD OP Progress Note  08/06/2024 9:28 AM Rhonda [name]  MRN:  [id number]  Chief Complaint:  Chief Complaint  Patient presents with   Follow-up   Medication Refill   Anxiety   ADD   Depression   Discussed the use of AI scribe software for clinical note transcription with the patient, who gave verbal consent to proceed.  History of Present Illness Rhonda [name] is a 38 year old Caucasian female, divorced, employed, lives in [place], has a history of MDD, GAD, insomnia, ADHD was evaluated in office today for a follow-up appointment.  She reports doing well overall. She continues to have ongoing sleep difficulties, as she does not stay asleep throughout the night despite using Ambien  and a CPAP machine for mild sleep apnea. She expresses interest in eventually discontinuing Ambien .   She is getting ready for gastric sleeve surgery which is coming up on December 22.  She would like her Ambien  extended release to change to an immediate release since she may not be able to take the extended release medications after the surgery.  Otherwise she reports her surgeon has cleared her to stay on her other psychotropics.  She currently denies any significant depression.  She does have anxiety regarding her upcoming surgery although she is managing well.  She does have good support system.  She denies any side effects to her medications.  She denies any thoughts about hurting herself or others.  She shares custody of her children and had them for part of Thanksgiving. She plans to stay with her mother during recovery from surgery. She recently moved in with her boyfriend.    Visit Diagnosis:    ICD-10-CM   1. Recurrent major depressive disorder, in full remission  F33.42     2. GAD (generalized anxiety disorder)  F41.1 sertraline  (ZOLOFT ) 50 MG tablet    3. Insomnia due to medical condition  G47.01 zolpidem  (AMBIEN ) 5 MG tablet   mood, OSA mild    4. Attention deficit  hyperactivity disorder (ADHD), predominantly inattentive type  F90.0       Past Psychiatric History: I have reviewed past psychiatric history from progress note on 11/05/2018.  Past trials of medications like sertraline , Adderall, Wellbutrin , trazodone , Topamax , doxepin .  Past Medical History:  Past Medical History:  Diagnosis Date   ADHD (attention deficit hyperactivity disorder)    Anxiety    Depression    treated   History of kidney stones    Remote Hx,   Hypertension    PCOS (polycystic ovarian syndrome) 2008   Plantar fasciitis    Sleep apnea    uses CPAP    Past Surgical History:  Procedure Laterality Date   CHOLECYSTECTOMY     laparoscopic   WISDOM TOOTH EXTRACTION      Family Psychiatric History: Reviewed family psychiatric history from progress note on 11/05/2018.  Family History:  Family History  Problem Relation Age of Onset   High blood pressure Mother    Depression Father    Liver disease Father    Alcoholism Father    Alcohol abuse Father    Diabetes Paternal Grandmother    Bipolar disorder Maternal Aunt     Social History: I have reviewed social history from progress note on 11/05/2018. Social History   Socioeconomic History   Marital status: Divorced    Spouse name: Dorn   Number of children: 2   Years of education: Not on file   Highest education level: Bachelor's degree (e.g., BA, AB,  BS)  Occupational History   Occupation: [employer]  Tobacco Use   Smoking status: Never   Smokeless tobacco: Never  Vaping Use   Vaping status: Never Used  Substance and Sexual Activity   Alcohol use: Yes    Alcohol/week: 1.0 standard drink of alcohol    Types: 1 Glasses of wine per week    Comment: Occasional once a week.   Drug use: No   Sexual activity: Yes    Partners: Male    Birth control/protection: None, I.U.D.  Other Topics Concern   Not on file  Social History Narrative   Not on file   Social Drivers of Health   Tobacco Use: Low Risk  (08/06/2024)   Patient History    Smoking Tobacco Use: Never    Smokeless Tobacco Use: Never    Passive Exposure: Not on file  Financial Resource Strain: Low Risk  (06/25/2024)   Received from [facility]   Overall Financial Resource Strain (CARDIA)    Difficulty of Paying Living Expenses: Not hard at all  Food Insecurity: No Food Insecurity (06/25/2024)   Received from [facility]   Epic    Within the past 12 months, you worried that your food would run out before you got the money to buy more.: Never true    Within the past 12 months, the food you bought just didn't last and you didn't have money to get more.: Never true  Transportation Needs: No Transportation Needs (06/25/2024)   Received from [facility] - Transportation    In the past 12 months, has lack of transportation kept you from medical appointments or from getting medications?: No    Lack of Transportation (Non-Medical): No  Physical Activity: Not on file  Stress: Not on file  Social Connections: Not on file  Depression (PHQ2-9): Low Risk (08/06/2024)   Depression (PHQ2-9)    PHQ-2 Score: 0  Alcohol Screen: Not on file  Housing: Low Risk  (06/25/2024)   Received from [facility]   Epic    In the last 12 months, was there a time when you were not able to pay the mortgage or rent on time?: No    In the past 12 months, how many times have you moved where you were living?: 0    At any time in the past 12 months, were you homeless or living in a shelter (including now)?: No  Recent Concern: Housing - High Risk (05/27/2024)   Received from [facility]   Epic    In the last 12 months, was there a time when you were not able to pay the mortgage or rent on time?: Yes    In the past 12 months, how many times have you moved where you were living?: 0    At any time in the past 12 months, were you homeless or living in a shelter  (including now)?: No  Utilities: Not At Risk (06/25/2024)   Received from [facility]   Epic    In the past 12 months has the electric, gas, oil, or water company threatened to shut off services in your home?: No  Health Literacy: Not on file    Allergies: Allergies[1]  Metabolic Disorder Labs: Lab Results  Component Value Date   HGBA1C 5.1 03/17/2024   No results found for: PROLACTIN Lab Results  Component Value Date   CHOL 174 10/07/2023   TRIG  112 10/07/2023   HDL 41 10/07/2023   CHOLHDL 4.2 10/07/2023   LDLCALC 113 (H) 10/07/2023   LDLCALC 75 04/02/2022   Lab Results  Component Value Date   TSH 2.170 10/07/2023   TSH 1.680 01/24/2023    Therapeutic Level Labs: No results found for: LITHIUM No results found for: VALPROATE No results found for: CBMZ  Current Medications: Current Outpatient Medications  Medication Sig Dispense Refill   zolpidem  (AMBIEN ) 5 MG tablet Take 1 tablet (5 mg total) by mouth at bedtime as needed for sleep. 30 tablet 1   busPIRone  (BUSPAR ) 15 MG tablet Take 1 tablet (15 mg total) by mouth 2 (two) times daily. 180 tablet 1   hydrOXYzine  (VISTARIL ) 25 MG capsule Take 1-2 capsules (25-50 mg total) by mouth at bedtime as needed for sleep 180 capsule 0   Levomefolate Glucosamine (METHYL-FOLATE) 1700 MCG CAPS Take 1,000 mg by mouth daily.     levonorgestrel  (MIRENA ) 20 MCG/24HR IUD 1 each by Intrauterine route once.     lisdexamfetamine  (VYVANSE ) 20 MG capsule Take 1 capsule (20 mg total) by mouth in the morning. 90 capsule 0   Multiple Vitamin (MULTIVITAMIN) tablet Take 1 tablet by mouth daily.     Probiotic Product (PROBIOTIC-10 PO) Take by mouth.     sertraline  (ZOLOFT ) 50 MG tablet Take 1 tablet (50 mg total) by mouth daily. 90 tablet 0   spironolactone  (ALDACTONE ) 100 MG tablet Take 1 tablet (100 mg total) by mouth 2 (two) times daily 180 tablet 3   Vitamin D , Ergocalciferol , (DRISDOL ) 1.25 MG (50000 UNIT) CAPS capsule  Take 1 capsule (50,000 Units total) by mouth 2 (two) times a week. 8 capsule 0   No current facility-administered medications for this visit.     Musculoskeletal: Strength & Muscle Tone: within normal limits Gait & Station: normal Patient leans: N/A  Psychiatric Specialty Exam: Review of Systems  Psychiatric/Behavioral:  Positive for sleep disturbance.     Blood pressure (!) 134/94, pulse 91, temperature 97.6 F (36.4 C), temperature source Temporal, height 5' 9.5 (1.765 m), weight 286 lb (129.7 kg).Body mass index is 41.63 kg/m.  General Appearance: Casual  Eye Contact:  Fair  Speech:  Clear and Coherent  Volume:  Normal  Mood:  Euthymic  Affect:  Congruent  Thought Process:  Goal Directed and Descriptions of Associations: Intact  Orientation:  Full (Time, Place, and Person)  Thought Content: Logical   Suicidal Thoughts:  No  Homicidal Thoughts:  No  Memory:  Immediate;   Fair Recent;   Fair Remote;   Fair  Judgement:  Fair  Insight:  Fair  Psychomotor Activity:  Normal  Concentration:  Concentration: Fair and Attention Span: Fair  Recall:  [employer] of Knowledge: Fair  Language: Fair  Akathisia:  No  Handed:  Left  AIMS (if indicated): not done  Assets:  Communication Skills Desire for Improvement Housing Social Support  ADL's:  Intact  Cognition: WNL  Sleep:  varies   Screenings: GAD-7    Loss Adjuster, Chartered Office Visit from 08/06/2024 in [facility] Office Visit from 04/08/2024 in [facility] Office Visit from 10/23/2023 in [facility] Visit from 08/27/2023 in [facility] Office Visit from 03/18/2023 in [facility]  Total GAD-7 Score 1 3 4 5 4    PHQ2-9    Flowsheet Row Office Visit from 08/06/2024 in [facility]  Nutrition from 06/15/2024 in [facility]  - A Dept Of [place] [facility] Office Visit from 04/08/2024 in [facility] Office Visit from 10/23/2023 in [facility] Office Visit from 08/27/2023 in [facility]  PHQ-2 Total Score 0 0 0 0 1   Flowsheet Row Office Visit from 08/06/2024 in [facility] Video Visit from 06/09/2024 in [facility] Office Visit from 04/08/2024 in [facility]  C-SSRS RISK CATEGORY No Risk No Risk No Risk     Assessment and Plan: Rhonda [name] is a 38 year old Caucasian female who presented for a follow-up appointment, discussed assessment and plan as noted below.  1. Recurrent major depressive disorder, in full remission Currently denies any significant depression symptoms Continue Buspirone  15 mg twice daily Continue Zoloft  50 mg daily  2. GAD (generalized anxiety disorder)-stable Currently reports anxiety symptoms is well-managed Continue Zoloft  50 mg daily  3. Insomnia due to medical condition-unstable Does have sleep problems, underlying sleep apnea, reports not a lot of improvement even with the CPAP.  Although Ambien  does help.  Would like to change Ambien  to immediate release due to upcoming gastric sleeve surgery. Discontinue Ambien  6.25 mg at bedtime Start Ambien  5 mg at bedtime as needed Continue Hydroxyzine  25-50 mg at bedtime as needed  4. Attention deficit hyperactivity disorder (ADHD), predominantly inattentive type-stable Currently reports Vyvanse  is beneficial for ADHD symptoms Continue Vyvanse  20 mg daily Reviewed [place] PMP AWARxE  Follow-up Follow-up in clinic in 6 to 8 weeks or sooner if needed.    Consent: Patient/Guardian gives verbal consent for treatment and assignment of  benefits for services provided during this visit. Patient/Guardian expressed understanding and agreed to proceed.  This note was generated in part or whole with voice recognition software. Voice recognition is usually quite accurate but there are transcription errors that can and very often do occur. I apologize for any typographical errors that were not detected and corrected.     Edward Guthmiller, MD 08/07/2024, 7:58 AM     [1] No Known Allergies

## 2024-08-08 NOTE — Anesthesia Preprocedure Evaluation (Signed)
"                                    Anesthesia Evaluation  Patient identified by MRN, date of birth, ID band Patient awake    Reviewed: Allergy & Precautions, H&P , NPO status , Patient's Chart, lab work & pertinent test results  Airway Mallampati: III  TM Distance: >3 FB Neck ROM: Full    Dental  (+) Teeth Intact, Dental Advisory Given   Pulmonary sleep apnea and Continuous Positive Airway Pressure Ventilation    Pulmonary exam normal breath sounds clear to auscultation       Cardiovascular hypertension (134/87 preop), Normal cardiovascular exam Rhythm:Regular Rate:Normal     Neuro/Psych  PSYCHIATRIC DISORDERS Anxiety Depression       GI/Hepatic negative GI ROS, Neg liver ROS,,,  Endo/Other    Class 3 obesity (BMI 42)  Renal/GU negative Renal ROS  negative genitourinary   Musculoskeletal negative musculoskeletal ROS (+)    Abdominal  (+) + obese  Peds negative pediatric ROS (+)  Hematology negative hematology ROS (+) Hb 14.6   Anesthesia Other Findings   Reproductive/Obstetrics negative OB ROS                              Anesthesia Physical Anesthesia Plan  ASA: 3  Anesthesia Plan: General   Post-op Pain Management: Tylenol  PO (pre-op)*, Ketamine  IV*, Dilaudid  IV and Precedex   Induction: Intravenous  PONV Risk Score and Plan: 4 or greater and Ondansetron , Dexamethasone , Midazolam , Scopolamine  patch - Pre-op, Treatment may vary due to age or medical condition and Propofol  infusion  Airway Management Planned: Oral ETT  Additional Equipment: None  Intra-op Plan:   Post-operative Plan: Extubation in OR  Informed Consent: I have reviewed the patients History and Physical, chart, labs and discussed the procedure including the risks, benefits and alternatives for the proposed anesthesia with the patient or authorized representative who has indicated his/her understanding and acceptance.     Dental advisory  given  Plan Discussed with: CRNA  Anesthesia Plan Comments:          Anesthesia Quick Evaluation  "

## 2024-08-10 ENCOUNTER — Encounter (HOSPITAL_COMMUNITY): Payer: Self-pay | Admitting: General Surgery

## 2024-08-10 ENCOUNTER — Inpatient Hospital Stay (HOSPITAL_COMMUNITY)
Admission: RE | Admit: 2024-08-10 | Discharge: 2024-08-11 | DRG: 621 | Disposition: A | Discharge status: Home / Self Care | Source: Ambulatory Visit | Attending: General Surgery | Admitting: General Surgery

## 2024-08-10 ENCOUNTER — Encounter (HOSPITAL_COMMUNITY): Payer: Self-pay | Admitting: Medical

## 2024-08-10 ENCOUNTER — Inpatient Hospital Stay (HOSPITAL_COMMUNITY): Payer: Self-pay | Admitting: Anesthesiology

## 2024-08-10 ENCOUNTER — Other Ambulatory Visit: Payer: Self-pay

## 2024-08-10 ENCOUNTER — Encounter (HOSPITAL_COMMUNITY): Admission: RE | Disposition: A | Payer: Self-pay | Source: Ambulatory Visit | Attending: General Surgery

## 2024-08-10 DIAGNOSIS — F419 Anxiety disorder, unspecified: Secondary | ICD-10-CM | POA: Diagnosis present

## 2024-08-10 DIAGNOSIS — I10 Essential (primary) hypertension: Secondary | ICD-10-CM | POA: Diagnosis not present

## 2024-08-10 DIAGNOSIS — Z833 Family history of diabetes mellitus: Secondary | ICD-10-CM

## 2024-08-10 DIAGNOSIS — K760 Fatty (change of) liver, not elsewhere classified: Secondary | ICD-10-CM | POA: Diagnosis present

## 2024-08-10 DIAGNOSIS — Z820 Family history of epilepsy and other diseases of the nervous system: Secondary | ICD-10-CM

## 2024-08-10 DIAGNOSIS — F418 Other specified anxiety disorders: Secondary | ICD-10-CM | POA: Diagnosis not present

## 2024-08-10 DIAGNOSIS — Z9884 Bariatric surgery status: Secondary | ICD-10-CM

## 2024-08-10 DIAGNOSIS — Z801 Family history of malignant neoplasm of trachea, bronchus and lung: Secondary | ICD-10-CM

## 2024-08-10 DIAGNOSIS — E559 Vitamin D deficiency, unspecified: Secondary | ICD-10-CM | POA: Diagnosis present

## 2024-08-10 DIAGNOSIS — Z818 Family history of other mental and behavioral disorders: Secondary | ICD-10-CM | POA: Diagnosis not present

## 2024-08-10 DIAGNOSIS — Z8249 Family history of ischemic heart disease and other diseases of the circulatory system: Secondary | ICD-10-CM

## 2024-08-10 DIAGNOSIS — Z800 Family history of malignant neoplasm of digestive organs: Secondary | ICD-10-CM

## 2024-08-10 DIAGNOSIS — Z79899 Other long term (current) drug therapy: Secondary | ICD-10-CM

## 2024-08-10 DIAGNOSIS — G4733 Obstructive sleep apnea (adult) (pediatric): Secondary | ICD-10-CM | POA: Diagnosis present

## 2024-08-10 DIAGNOSIS — Z811 Family history of alcohol abuse and dependence: Secondary | ICD-10-CM | POA: Diagnosis not present

## 2024-08-10 DIAGNOSIS — Z8262 Family history of osteoporosis: Secondary | ICD-10-CM | POA: Diagnosis not present

## 2024-08-10 DIAGNOSIS — Z6841 Body Mass Index (BMI) 40.0 and over, adult: Secondary | ICD-10-CM

## 2024-08-10 DIAGNOSIS — Z83438 Family history of other disorder of lipoprotein metabolism and other lipidemia: Secondary | ICD-10-CM

## 2024-08-10 DIAGNOSIS — E282 Polycystic ovarian syndrome: Secondary | ICD-10-CM | POA: Diagnosis present

## 2024-08-10 DIAGNOSIS — F32A Depression, unspecified: Secondary | ICD-10-CM | POA: Diagnosis present

## 2024-08-10 DIAGNOSIS — Z01818 Encounter for other preprocedural examination: Principal | ICD-10-CM

## 2024-08-10 HISTORY — PX: UPPER GI ENDOSCOPY: SHX6162

## 2024-08-10 HISTORY — PX: LAPAROSCOPIC GASTRIC SLEEVE RESECTION: SHX5895

## 2024-08-10 LAB — HEMOGLOBIN AND HEMATOCRIT, BLOOD
HCT: 41 % (ref 36.0–46.0)
Hemoglobin: 14.2 g/dL (ref 12.0–15.0)

## 2024-08-10 LAB — POCT PREGNANCY, URINE: Preg Test, Ur: NEGATIVE

## 2024-08-10 SURGERY — GASTRECTOMY, SLEEVE, LAPAROSCOPIC
Anesthesia: General | Site: Abdomen

## 2024-08-10 MED ORDER — SUGAMMADEX SODIUM 200 MG/2ML IV SOLN
INTRAVENOUS | Status: DC | PRN
  Administered 2024-08-10: 200 mg via INTRAVENOUS

## 2024-08-10 MED ORDER — MEPERIDINE HCL 25 MG/ML IJ SOLN: 6.2500 mg | INTRAMUSCULAR | Status: DC | PRN

## 2024-08-10 MED ORDER — SCOPOLAMINE 1 MG/3DAYS TD PT72: 1.0000 | MEDICATED_PATCH | TRANSDERMAL | Status: DC

## 2024-08-10 MED ORDER — HEPARIN SODIUM (PORCINE) 5000 UNIT/ML IJ SOLN
5000.0000 [IU] | INTRAMUSCULAR | Status: AC
  Administered 2024-08-10: 5000 [IU] via SUBCUTANEOUS
  Filled 2024-08-10: qty 1

## 2024-08-10 MED ORDER — BUPIVACAINE LIPOSOME 1.3 % IJ SUSP: 20.0000 mL | Freq: Once | INTRAMUSCULAR | Status: DC

## 2024-08-10 MED ORDER — KETAMINE HCL 50 MG/5ML IJ SOSY
PREFILLED_SYRINGE | INTRAMUSCULAR | Status: AC
  Filled 2024-08-10: qty 5

## 2024-08-10 MED ORDER — 0.9 % SODIUM CHLORIDE (POUR BTL) OPTIME
TOPICAL | Status: DC | PRN
  Administered 2024-08-10: 1000 mL

## 2024-08-10 MED ORDER — FENTANYL CITRATE (PF) 100 MCG/2ML IJ SOLN
INTRAMUSCULAR | Status: AC
  Filled 2024-08-10: qty 2

## 2024-08-10 MED ORDER — ORAL CARE MOUTH RINSE: 15.0000 mL | Freq: Once | OROMUCOSAL | Status: AC

## 2024-08-10 MED ORDER — ONDANSETRON HCL 4 MG/2ML IJ SOLN
INTRAMUSCULAR | Status: AC
  Filled 2024-08-10: qty 2

## 2024-08-10 MED ORDER — HYDROMORPHONE HCL 1 MG/ML IJ SOLN
INTRAMUSCULAR | Status: DC | PRN
  Administered 2024-08-10 (×2): 1 mg via INTRAVENOUS

## 2024-08-10 MED ORDER — FENTANYL CITRATE (PF) 100 MCG/2ML IJ SOLN
INTRAMUSCULAR | Status: DC | PRN
  Administered 2024-08-10 (×2): 50 ug via INTRAVENOUS

## 2024-08-10 MED ORDER — SODIUM CHLORIDE 0.9 % IV SOLN
12.5000 mg | Freq: Four times a day (QID) | INTRAVENOUS | Status: DC | PRN
  Administered 2024-08-10: 12.5 mg via INTRAVENOUS
  Filled 2024-08-10: qty 12.5

## 2024-08-10 MED ORDER — ACETAMINOPHEN 500 MG PO TABS
1000.0000 mg | ORAL_TABLET | Freq: Three times a day (TID) | ORAL | Status: DC
  Administered 2024-08-10 – 2024-08-11 (×2): 1000 mg via ORAL
  Filled 2024-08-10 (×2): qty 2

## 2024-08-10 MED ORDER — ENSURE MAX PROTEIN PO LIQD
2.0000 [oz_av] | ORAL | Status: DC
  Administered 2024-08-11 (×2): 2 [oz_av] via ORAL

## 2024-08-10 MED ORDER — GABAPENTIN 100 MG PO CAPS
100.0000 mg | ORAL_CAPSULE | Freq: Two times a day (BID) | ORAL | Status: DC
  Administered 2024-08-10 – 2024-08-11 (×2): 100 mg via ORAL
  Filled 2024-08-10 (×2): qty 1

## 2024-08-10 MED ORDER — BUPIVACAINE-EPINEPHRINE (PF) 0.25% -1:200000 IJ SOLN
INTRAMUSCULAR | Status: AC
  Filled 2024-08-10: qty 60

## 2024-08-10 MED ORDER — BUPIVACAINE-EPINEPHRINE 0.25% -1:200000 IJ SOLN
INTRAMUSCULAR | Status: DC | PRN
  Administered 2024-08-10: 60 mL

## 2024-08-10 MED ORDER — BUSPIRONE HCL 5 MG PO TABS
15.0000 mg | ORAL_TABLET | Freq: Two times a day (BID) | ORAL | Status: DC
  Administered 2024-08-10 – 2024-08-11 (×2): 15 mg via ORAL
  Filled 2024-08-10 (×2): qty 3

## 2024-08-10 MED ORDER — HYDROMORPHONE HCL 2 MG/ML IJ SOLN
INTRAMUSCULAR | Status: AC
  Filled 2024-08-10: qty 1

## 2024-08-10 MED ORDER — ROCURONIUM BROMIDE 10 MG/ML (PF) SYRINGE
PREFILLED_SYRINGE | INTRAVENOUS | Status: AC
  Filled 2024-08-10: qty 10

## 2024-08-10 MED ORDER — ACETAMINOPHEN 500 MG PO TABS
1000.0000 mg | ORAL_TABLET | ORAL | Status: AC
  Administered 2024-08-10: 1000 mg via ORAL
  Filled 2024-08-10: qty 2

## 2024-08-10 MED ORDER — KETAMINE HCL 50 MG/5ML IJ SOSY
PREFILLED_SYRINGE | INTRAMUSCULAR | Status: DC | PRN
  Administered 2024-08-10: 10 mg via INTRAVENOUS
  Administered 2024-08-10: 20 mg via INTRAVENOUS
  Administered 2024-08-10 (×2): 10 mg via INTRAVENOUS

## 2024-08-10 MED ORDER — GABAPENTIN 100 MG PO CAPS
100.0000 mg | ORAL_CAPSULE | ORAL | Status: AC
  Administered 2024-08-10: 100 mg via ORAL
  Filled 2024-08-10: qty 1

## 2024-08-10 MED ORDER — ACETAMINOPHEN 160 MG/5ML PO SOLN
1000.0000 mg | Freq: Three times a day (TID) | ORAL | Status: DC
  Administered 2024-08-10: 1000 mg via ORAL
  Filled 2024-08-10: qty 40.6

## 2024-08-10 MED ORDER — LACTATED RINGERS IR SOLN
Status: DC | PRN
  Administered 2024-08-10: 1000 mL

## 2024-08-10 MED ORDER — CHLORHEXIDINE GLUCONATE 4 % EX SOLN: Freq: Once | CUTANEOUS | Status: DC

## 2024-08-10 MED ORDER — OXYCODONE HCL 5 MG PO TABS: 5.0000 mg | ORAL_TABLET | Freq: Once | ORAL | Status: DC | PRN

## 2024-08-10 MED ORDER — OXYCODONE HCL 5 MG/5ML PO SOLN
5.0000 mg | Freq: Four times a day (QID) | ORAL | Status: DC | PRN
  Administered 2024-08-10: 5 mg via ORAL
  Filled 2024-08-10: qty 5

## 2024-08-10 MED ORDER — LIDOCAINE HCL (PF) 2 % IJ SOLN
INTRAMUSCULAR | Status: AC
  Filled 2024-08-10: qty 10

## 2024-08-10 MED ORDER — SCOPOLAMINE 1 MG/3DAYS TD PT72
1.0000 | MEDICATED_PATCH | TRANSDERMAL | Status: DC
  Administered 2024-08-10: 1 mg via TRANSDERMAL
  Filled 2024-08-10: qty 1

## 2024-08-10 MED ORDER — PHENYLEPHRINE 80 MCG/ML (10ML) SYRINGE FOR IV PUSH (FOR BLOOD PRESSURE SUPPORT)
PREFILLED_SYRINGE | INTRAVENOUS | Status: AC
  Filled 2024-08-10: qty 10

## 2024-08-10 MED ORDER — PANTOPRAZOLE SODIUM 40 MG IV SOLR
40.0000 mg | Freq: Every day | INTRAVENOUS | Status: DC
  Administered 2024-08-10: 40 mg via INTRAVENOUS
  Filled 2024-08-10: qty 10

## 2024-08-10 MED ORDER — KCL IN DEXTROSE-NACL 20-5-0.45 MEQ/L-%-% IV SOLN
INTRAVENOUS | Status: DC
  Filled 2024-08-10 (×3): qty 1000

## 2024-08-10 MED ORDER — PROPOFOL 10 MG/ML IV BOLUS
INTRAVENOUS | Status: AC
  Filled 2024-08-10: qty 20

## 2024-08-10 MED ORDER — ONDANSETRON HCL 4 MG/2ML IJ SOLN
4.0000 mg | Freq: Four times a day (QID) | INTRAMUSCULAR | Status: DC | PRN
  Administered 2024-08-10: 4 mg via INTRAVENOUS
  Filled 2024-08-10: qty 2

## 2024-08-10 MED ORDER — LIDOCAINE HCL (PF) 2 % IJ SOLN
INTRAMUSCULAR | Status: DC | PRN
  Administered 2024-08-10: 60 mg via INTRADERMAL

## 2024-08-10 MED ORDER — CHLORHEXIDINE GLUCONATE 0.12 % MT SOLN
15.0000 mL | Freq: Once | OROMUCOSAL | Status: AC
  Administered 2024-08-10: 15 mL via OROMUCOSAL

## 2024-08-10 MED ORDER — SIMETHICONE 80 MG PO CHEW: 80.0000 mg | CHEWABLE_TABLET | Freq: Four times a day (QID) | ORAL | Status: DC | PRN

## 2024-08-10 MED ORDER — AMISULPRIDE (ANTIEMETIC) 5 MG/2ML IV SOLN
INTRAVENOUS | Status: AC
  Filled 2024-08-10: qty 4

## 2024-08-10 MED ORDER — MIDAZOLAM HCL 2 MG/2ML IJ SOLN
INTRAMUSCULAR | Status: AC
  Filled 2024-08-10: qty 2

## 2024-08-10 MED ORDER — MORPHINE SULFATE (PF) 2 MG/ML IV SOLN: 1.0000 mg | INTRAVENOUS | Status: DC | PRN

## 2024-08-10 MED ORDER — HEPARIN SODIUM (PORCINE) 5000 UNIT/ML IJ SOLN
5000.0000 [IU] | Freq: Three times a day (TID) | INTRAMUSCULAR | Status: DC
  Administered 2024-08-10 – 2024-08-11 (×2): 5000 [IU] via SUBCUTANEOUS
  Filled 2024-08-10 (×2): qty 1

## 2024-08-10 MED ORDER — HYDROMORPHONE HCL 1 MG/ML IJ SOLN
INTRAMUSCULAR | Status: AC
  Filled 2024-08-10: qty 1

## 2024-08-10 MED ORDER — PROPOFOL 10 MG/ML IV BOLUS
INTRAVENOUS | Status: DC | PRN
  Administered 2024-08-10: 200 mg via INTRAVENOUS

## 2024-08-10 MED ORDER — OXYCODONE HCL 5 MG/5ML PO SOLN: 5.0000 mg | Freq: Once | ORAL | Status: DC | PRN

## 2024-08-10 MED ORDER — HYDRALAZINE HCL 20 MG/ML IJ SOLN: 10.0000 mg | INTRAMUSCULAR | Status: DC | PRN

## 2024-08-10 MED ORDER — EPHEDRINE 5 MG/ML INJ
INTRAVENOUS | Status: AC
  Filled 2024-08-10: qty 5

## 2024-08-10 MED ORDER — ROCURONIUM BROMIDE 10 MG/ML (PF) SYRINGE
PREFILLED_SYRINGE | INTRAVENOUS | Status: DC | PRN
  Administered 2024-08-10: 100 mg via INTRAVENOUS

## 2024-08-10 MED ORDER — ONDANSETRON HCL 4 MG/2ML IJ SOLN
4.0000 mg | Freq: Once | INTRAMUSCULAR | Status: AC | PRN
  Administered 2024-08-10: 4 mg via INTRAVENOUS

## 2024-08-10 MED ORDER — ACETAMINOPHEN 500 MG PO TABS: 1000.0000 mg | ORAL_TABLET | Freq: Once | ORAL | Status: DC

## 2024-08-10 MED ORDER — AMISULPRIDE (ANTIEMETIC) 5 MG/2ML IV SOLN
10.0000 mg | Freq: Once | INTRAVENOUS | Status: AC | PRN
  Administered 2024-08-10: 10 mg via INTRAVENOUS

## 2024-08-10 MED ORDER — SERTRALINE HCL 50 MG PO TABS
50.0000 mg | ORAL_TABLET | Freq: Every day | ORAL | Status: DC
  Administered 2024-08-11: 50 mg via ORAL
  Filled 2024-08-10: qty 1

## 2024-08-10 MED ORDER — DEXAMETHASONE SOD PHOSPHATE PF 10 MG/ML IJ SOLN
4.0000 mg | INTRAMUSCULAR | Status: AC
  Administered 2024-08-10: 10 mg via INTRAVENOUS

## 2024-08-10 MED ORDER — MIDAZOLAM HCL 5 MG/5ML IJ SOLN
INTRAMUSCULAR | Status: DC | PRN
  Administered 2024-08-10: 2 mg via INTRAVENOUS

## 2024-08-10 MED ORDER — ONDANSETRON HCL 4 MG/2ML IJ SOLN
INTRAMUSCULAR | Status: DC | PRN
  Administered 2024-08-10: 4 mg via INTRAVENOUS

## 2024-08-10 MED ORDER — LACTATED RINGERS IV SOLN: INTRAVENOUS | Status: DC

## 2024-08-10 MED ORDER — SUGAMMADEX SODIUM 200 MG/2ML IV SOLN
INTRAVENOUS | Status: AC
  Filled 2024-08-10: qty 4

## 2024-08-10 MED ORDER — HYDROMORPHONE HCL 1 MG/ML IJ SOLN
0.2500 mg | INTRAMUSCULAR | Status: DC | PRN
  Administered 2024-08-10 (×4): 0.5 mg via INTRAVENOUS

## 2024-08-10 MED ORDER — SODIUM CHLORIDE 0.9 % IV SOLN
2.0000 g | INTRAVENOUS | Status: AC
  Administered 2024-08-10: 2 g via INTRAVENOUS
  Filled 2024-08-10: qty 2

## 2024-08-10 MED ADMIN — Lidocaine HCl Local Soln Prefilled Syringe 100 MG/5ML (2%): 1.5 mg/kg/h | INTRAVENOUS | NDC 70004072309

## 2024-08-10 SURGICAL SUPPLY — 58 items
APPLICATOR COTTON TIP 6 STRL (MISCELLANEOUS) IMPLANT
BAG COUNTER SPONGE SURGICOUNT (BAG) IMPLANT
BLADE SURG SZ11 CARB STEEL (BLADE) ×2 IMPLANT
CHLORAPREP W/TINT 26 (MISCELLANEOUS) ×4 IMPLANT
CLIP APPLIE ROT 10 11.4 M/L (STAPLE) IMPLANT
CLIP APPLIE ROT 13.4 12 LRG (CLIP) IMPLANT
COVER SURGICAL LIGHT HANDLE (MISCELLANEOUS) ×2 IMPLANT
DEVICE SUT QUICK LOAD TK 5 (SUTURE) IMPLANT
DEVICE SUT TI-KNOT TK 5X26 (SUTURE) IMPLANT
DEVICE SUTURE ENDOST 10MM (ENDOMECHANICALS) IMPLANT
DISSECTOR BLUNT TIP ENDO 5MM (MISCELLANEOUS) IMPLANT
DRAPE UTILITY XL STRL (DRAPES) ×4 IMPLANT
DRSG TEGADERM 2-3/8X2-3/4 SM (GAUZE/BANDAGES/DRESSINGS) ×12 IMPLANT
ELECT REM PT RETURN 15FT ADLT (MISCELLANEOUS) ×2 IMPLANT
ELECTRODE L-HOOK LAP 45CM DISP (ELECTROSURGICAL) IMPLANT
GAUZE SPONGE 2X2 8PLY STRL LF (GAUZE/BANDAGES/DRESSINGS) IMPLANT
GAUZE SPONGE 4X4 12PLY STRL (GAUZE/BANDAGES/DRESSINGS) IMPLANT
GLOVE BIO SURGEON STRL SZ7.5 (GLOVE) ×2 IMPLANT
GLOVE INDICATOR 8.0 STRL GRN (GLOVE) ×2 IMPLANT
GOWN STRL REUS W/ TWL XL LVL3 (GOWN DISPOSABLE) ×2 IMPLANT
GRASPER SUT TROCAR 14GX15 (MISCELLANEOUS) ×2 IMPLANT
IRRIGATION SUCT STRKRFLW 2 WTP (MISCELLANEOUS) ×2 IMPLANT
KIT BASIN OR (CUSTOM PROCEDURE TRAY) ×2 IMPLANT
KIT TURNOVER KIT A (KITS) ×2 IMPLANT
MARKER SKIN DUAL TIP RULER LAB (MISCELLANEOUS) ×2 IMPLANT
MAT PREVALON FULL STRYKER (MISCELLANEOUS) ×2 IMPLANT
NDL SPNL 22GX3.5 QUINCKE BK (NEEDLE) ×2 IMPLANT
NEEDLE SPNL 22GX3.5 QUINCKE BK (NEEDLE) ×2 IMPLANT
PACK UNIVERSAL I (CUSTOM PROCEDURE TRAY) ×2 IMPLANT
RELOAD STAPLE 60 3.6 BLU REG (STAPLE) ×2 IMPLANT
RELOAD STAPLE 60 3.8 GOLD REG (STAPLE) IMPLANT
RELOAD STAPLE 60 4.1 GRN THCK (STAPLE) ×2 IMPLANT
RELOAD STAPLE 60 BLK VRY/THCK (STAPLE) IMPLANT
SCISSORS LAP 5X45 EPIX DISP (ENDOMECHANICALS) IMPLANT
SEALANT SURGICAL APPL DUAL CAN (MISCELLANEOUS) IMPLANT
SET TUBE SMOKE EVAC HIGH FLOW (TUBING) ×2 IMPLANT
SHEARS HARMONIC 45 ACE (MISCELLANEOUS) ×2 IMPLANT
SLEEVE ADV FIXATION 5X100MM (TROCAR) ×4 IMPLANT
SLEEVE GASTRECTOMY 40FR VISIGI (MISCELLANEOUS) ×2 IMPLANT
SOLUTION ANTFG W/FOAM PAD STRL (MISCELLANEOUS) ×2 IMPLANT
SPIKE FLUID TRANSFER (MISCELLANEOUS) ×2 IMPLANT
STAPLE LINE REINFORCEMENT LAP (STAPLE) IMPLANT
STAPLER ECHELON BIOABSB 60 FLE (MISCELLANEOUS) IMPLANT
STAPLER ECHELON LONG 3000 60 (ENDOMECHANICALS) IMPLANT
STAPLER ECHELON LONG 60 440 (INSTRUMENTS) IMPLANT
STRIP CLOSURE SKIN 1/2X4 (GAUZE/BANDAGES/DRESSINGS) ×2 IMPLANT
SUT MNCRL AB 4-0 PS2 18 (SUTURE) ×2 IMPLANT
SUT SURGIDAC NAB ES-9 0 48 120 (SUTURE) IMPLANT
SUT VICRYL 0 TIES 12 18 (SUTURE) ×2 IMPLANT
SYR 20ML LL LF (SYRINGE) ×2 IMPLANT
SYR 50ML LL SCALE MARK (SYRINGE) ×2 IMPLANT
SYSTEM KII OPTICAL ACCESS 15MM (TROCAR) ×2 IMPLANT
TOWEL OR DSP ST BLU DLX 10/PK (DISPOSABLE) ×2 IMPLANT
TROCAR ADV FIXATION 5X100MM (TROCAR) ×2 IMPLANT
TROCAR XCEL NON-BLD 5MMX100MML (ENDOMECHANICALS) ×2 IMPLANT
TROCAR Z-THREAD OPTICAL 5X100M (TROCAR) IMPLANT
TUBING CONNECTING 10 (TUBING) ×4 IMPLANT
TUBING ENDO SMARTCAP (MISCELLANEOUS) ×2 IMPLANT

## 2024-08-10 NOTE — H&P (Signed)
 PROVIDER: Virdie Penning [name], MD  MRN: [id number] DOB: Oct 02, 1985 DATE OF ENCOUNTER: 07/29/2024 Subjective  Chief Complaint: RETURN WEIGHT LOSS (- Preop for surgery d)   History of Present Illness Rhonda [name] is a 38 year old female who presents for a follow-up visit regarding her upcoming weight loss surgery.  She has had no new medical issues since her last visit in October, with no emergency room visits or hospitalizations. She continues to see Rhonda [name] and had a recent visit with Rhonda [name] on December 3rd.  Bowel movements occur daily, sometimes multiple times a day. Preoperative workup included chest x-ray, EKG, nutritional, and psychological evaluations. An upper GI study showed that one swallow appeared abnormal, but the other three swallows were normal. However she denies any dysphagia, stuck sensation or pain with swallowing or regurgitatio. preoperative labs showed mildly elevated AST and ALT, with normal bilirubin and alkaline phosphatase levels.  Her current BMI is 42.6. 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 She plans to have a massage preoperatively as she may not be comfortable lying on her stomach for two to three weeks post-surgery.  She inquires about potential hair loss post-surgery, which can occur with rapid weight loss and nutritional deficiencies. She currently takes prescription vitamin D , 50,000 units twice a week, and plans to continue this postoperatively.  Review of Systems: A complete review of systems was obtained from the patient. I have reviewed this information and discussed as appropriate with the patient. See HPI as well for other ROS.  ROS  Medical History: Past Medical History: Diagnosis Date 19w loss of twin pregnancy 10/24/2012 ADHD Amenorrhea r/t  pregnancy Anxiety Depression GBS (group B Streptococcus carrier), +RV culture, currently pregnant (HHS-HCC) 08/30/2016 Treat with abx in labor History of kidney stones 12/05/2011 Recurrent stones Unknown type Infertility management Miscarriage (HHS-HCC) 09/2012 Obesity (BMI 30.0-34.9) PCOS (polycystic ovarian syndrome) Sleep apnea Varicella zoster 03/13/2016 Diagnosed 03/13/16 started on TX acyclovir 800 mg 5/d x 7 days Watch for rare pneumonia development MFM Les confirms  Patient Active Problem List Diagnosis PCOS (polycystic ovarian syndrome) History of kidney stones Depression Obesity 19w loss of twin pregnancy Rotator cuff tendinitis, left Cubital tunnel syndrome, left Numbness and tingling in left hand  Past Surgical History: Procedure Laterality Date WISDOM TOOTH EXTRACTION 2004 CHOLECYSTECTOMY 10/27/2012   No Known Allergies  Current Outpatient Medications on File Prior to Visit Medication Sig Dispense Refill busPIRone  (BUSPAR ) 15 MG tablet Take 15 mg by mouth 2 (two) times daily cyclobenzaprine  (FLEXERIL ) 5 MG tablet Take 1 tablet (5 mg total) by mouth nightly as needed 30 tablet 1 ergocalciferol , vitamin D2, 1,250 mcg (50,000 unit) capsule Take 50,000 Units by mouth every 7 (seven) days levonorgestrel  (MIRENA ) 20 mcg/24 hr (5 years) IUD Insert 1 each into the uterus once Inserted on 11/30/21 lisdexamfetamine  (VYVANSE ) 20 MG capsule Take 20 mg by mouth every 5 (five) minutes as needed multivitamin tablet Take 1 tablet by mouth once daily. propranoloL  (INDERAL ) 20 MG tablet Take 1 tablet (20 mg total) by mouth 2 (two) times daily as needed 60 tablet 1 spironolactone  (ALDACTONE ) 100 MG tablet Take 1 tablet (100 mg total) by mouth 2 (two) times daily 180 tablet 3 ZOLOFT  50 mg tablet zolpidem  (AMBIEN  CR) 6.25 MG CR tablet Take 6.25 mg by mouth at bedtime as needed for Sleep  No current facility-administered medications on  file prior to visit.  Family  History Problem Relation Age of Onset High blood pressure (Hypertension) Mother High blood pressure (Hypertension) Father Alcohol abuse Father Depression Father Hyperlipidemia (Elevated cholesterol) Father Obesity Father High blood pressure (Hypertension) Sister Bipolar disorder Maternal Aunt Colon cancer Paternal Uncle 44 Osteoporosis (Thinning of bones) Maternal Grandmother Alzheimer's disease Maternal Grandmother Coronary Artery Disease (Blocked arteries around heart) Maternal Grandfather Diabetes type II Paternal Grandmother Glaucoma Paternal Grandmother Alcohol abuse Paternal Grandmother Lung cancer Paternal Grandfather   Social History  Tobacco Use Smoking Status Never Smokeless Tobacco Never   Social History  Socioeconomic History Marital status: Divorced Tobacco Use Smoking status: Never Smokeless tobacco: Never Vaping Use Vaping status: Never Used Substance and Sexual Activity Alcohol use: Yes Comment: 1-2x per month Drug use: No Sexual activity: Yes Partners: Male Birth control/protection: I.U.D. Comment: placed 11/2021  Social Drivers of Health  Financial Resource Strain: Low Risk (06/25/2024) Overall Financial Resource Strain (CARDIA) Difficulty of Paying Living Expenses: Not hard at all Food Insecurity: No Food Insecurity (06/25/2024) Hunger Vital Sign Worried About Running Out of Food in the Last Year: Never true Ran Out of Food in the Last Year: Never true Transportation Needs: No Transportation Needs (06/25/2024) PRAPARE - Contractor (Medical): No Lack of Transportation (Non-Medical): No Physical Activity: Inactive (11/05/2018) Received from [facility] Exercise Vital Sign On average, how many days per week do you engage in moderate to strenuous exercise (like a brisk walk)?: 0 days On average, how many minutes do you engage in exercise at this level?: 0 min Stress: Stress Concern Present (11/05/2018) Received from  [facility] - Occupational Stress Questionnaire Feeling of Stress : Rather much Social Connections: Unknown (11/05/2018) Received from [facility] Social Connection and Isolation Panel How often do you attend church or religious services?: More than 4 times per year Do you belong to any clubs or organizations such as church groups, unions, fraternal or athletic groups, or school groups?: No How often do you attend meetings of the clubs or organizations you belong to?: Never Are you married, widowed, divorced, separated, never married, or living with a partner?: Married Housing Stability: Low Risk (06/25/2024) Housing Stability Vital Sign Unable to Pay for Housing in the Last Year: No Number of Times Moved in the Last Year: 0 Homeless in the Last Year: No Recent Concern: Housing Stability - High Risk (05/27/2024) Housing Stability Vital Sign Unable to Pay for Housing in the Last Year: Yes Number of Times Moved in the Last Year: 0 Homeless in the Last Year: No  Objective:  Vitals: 07/29/24 1111 07/29/24 1112 BP: (!) 136/90 Pulse: 79 Resp: 16 Temp: 36.8 C (98.3 F) SpO2: 98% Weight: (!) 131.1 kg (289 lb) Height: 175.3 cm (5' 9) PainSc: 0-No pain  Body mass index is 42.68 kg/m.  Wt Readings from Last 10 Encounters: 07/29/24 (!) 131.1 kg (289 lb) 06/25/24 (!) 127.2 kg (280 lb 6.4 oz) 05/27/24 (!) 126.1 kg (278 lb) 12/18/23 (!) 119.4 kg (263 lb 3.2 oz) 06/19/23 (!) 117 kg (258 lb) 06/11/22 (!) 102.6 kg (226 lb 3.2 oz) 11/30/21 (!) 101.6 kg (224 lb) 07/27/21 (!) 102.9 kg (226 lb 12.8 oz) 06/06/21 (!) 105.1 kg (231 lb 12.8 oz) 05/16/20 (!) 115.2 kg (254 lb)  Gen: alert, NAD, non-toxic appearing Pupils: equal, no scleral icterus Pulm:symmetric chest rise Ext: no edema, Skin: no rash, no jaundice  Labs, Imaging and Diagnostic Testing: Upper GI June 05, 2024 The pharyngeal phase of swallowing  appears normal.  Primary  peristaltic waves in the esophagus were disrupted on 1/4 swallows, and there is also some proximal escape on 1 swallow. Patient has had no symptoms of dysphagia and accordingly this is most likely incidental.  No significant hiatal hernia observed. Gastric and duodenal morphology appear normal.  IMPRESSION: 1. Borderline appearance for esophageal dysmotility, probably incidental in this case given the lack of associated patient's symptoms. 2. Otherwise normal upper GI series. 3. Prior cholecystectomy.  Chest x-ray October 17-normal  Comprehensive metabolic panel and CBC on December 9 were normal except for mildly elevated AST of 56 and ALT of 72 which may represent some hemolysis  Labs from March 17, 2024 showing a normal B12 level, normal hemoglobin A1c, normal comprehensive metabolic panel except her ALT was slightly elevated at 36. Vitamin D  was a little bit low at 27.7  Reviewed thyroid , lipid panel from February 2025 LDL was slightly elevated at 113  Reviewed obesity medicine's note from July 22, 2024 Assessment and Plan: Diagnoses and all orders for this visit:  Severe obesity (CMS-HCC)  PCOS (polycystic ovarian syndrome)  Mild obstructive sleep apnea  Vitamin D  insufficiency  Elevated transaminase level    Assessment & Plan Severe obesity BMI of 42.6. Preoperative workup including chest x-ray, EKG, nutritional, and psychological evaluations were normal. Mild elevation in AST and ALT, possibly due to hemolysis or fatty liver. Upper GI showed transient finding of swallowing difficulty, but no significant abnormalities. Since she is asymptomatic I think it safe to proceed with sleeve gastrectomy preoperative meal plan discussed to reduce liver fat, with emphasis on not being overly strict. Surgery scheduled for December 22nd. Discussed risks of blood clots postoperatively and management with subcu heparin  and potential Lovenox if high risk. Hair loss post-surgery  discussed, with emphasis on protein and multivitamin intake. Dumping syndrome discussed, more common in bypass patients. No Foley catheter anticipated during surgery. Postoperative recovery and work return discussed. - Continue preoperative meal plan as tolerated. - Will administer subcu heparin  perioperatively. - Will monitor blood count postoperatively. - Will perform risk calculation score on the day of surgery to determine need for Lovenox. - Encouraged mobility postoperatively to prevent blood clots. - Monitor fluid intake postoperatively and arrange outpatient IV fluids if necessary. - Continue multivitamin with iron postoperatively. - Avoid straws for six weeks postoperatively. - Plan for two weeks off work with potential remote work, and four weeks off for part-time job. - Offered to rediscussed the steps of surgery along with risks and benefits but she declined. She read over the surgical consent form and we discussed the questions that she had and she signed it. - We rediscussed the typical hospitalization and the typical recovery and the typical things that we see after surgery. - SHe has attended her preoperative education class Vitamin D  deficiency Currently taking ergocalciferol  50,000 units twice a week. - Continue ergocalciferol  50,000 units twice a week.  Fatty liver Mild elevation in AST and ALT, possibly indicating fatty liver. No significant liver dysfunction noted. Importance of preoperative meal plan to reduce liver fat discussed. - Continue to monitor liver enzymes postoperatively.  This patient encounter took 25 minutes today to perform the following: take history, perform exam, review outside records, interpret imaging, counsel the patient on their diagnosis and document encounter, findings & plan in the EHR  No follow-ups on file.  This note has been created using automated tools and reviewed for accuracy by Kross Swallows MCADAMS [place] [employer].  Camellia [name]. Fryda Molenda MD FACS General,  Minimally Invasive, & Bariatric Surgery Electronically signed by Tanda Camellia Armour, MD at 07/29/2024 12:00 PM EST

## 2024-08-10 NOTE — Plan of Care (Signed)
  Problem: Education: Goal: Knowledge of General Education information will improve Description: Including pain rating scale, medication(s)/side effects and non-pharmacologic comfort measures Outcome: Progressing   Problem: Activity: Goal: Risk for activity intolerance will decrease Outcome: Progressing   Problem: Pain Managment: Goal: General experience of comfort will improve and/or be controlled Outcome: Progressing   Problem: Coping: Goal: Level of anxiety will decrease Outcome: Progressing

## 2024-08-10 NOTE — Transfer of Care (Signed)
 Immediate Anesthesia Transfer of Care Note  Patient: Rhonda [name]  Procedure(s) Performed: GASTRECTOMY, SLEEVE, LAPAROSCOPIC (Abdomen) ENDOSCOPY, UPPER GI TRACT  Patient Location: PACU  Anesthesia Type:General  Level of Consciousness: awake, alert , and oriented  Airway & Oxygen Therapy: Patient Spontanous Breathing and Patient connected to face mask oxygen  Post-op Assessment: Report given to RN and Post -op Vital signs reviewed and stable  Post vital signs: Reviewed and stable  Last Vitals:  Vitals Value Taken Time  BP 157/104 08/10/24 09:12  Temp    Pulse 82 08/10/24 09:14  Resp 17 08/10/24 09:14  SpO2 100 % 08/10/24 09:14  Vitals shown include unfiled device data.  Last Pain:  Vitals:   08/10/24 0608  TempSrc:   PainSc: 0-No pain         Complications: No notable events documented.

## 2024-08-10 NOTE — Op Note (Signed)
Preoperative diagnosis: laparoscopic sleeve gastrectomy ° °Postoperative diagnosis: Same  ° °Procedure: Upper endoscopy  ° °Surgeon: Darline Faith A Snyder Colavito, M.D. ° °Anesthesia: Gen.  ° °Description of procedure: The endoscope was placed in the mouth and oropharynx and under endoscopic vision it was advanced to the esophagogastric junction which was identified at 40cm from the teeth.  The pouch was tensely insufflated while the upper abdomen was flooded with irrigation to perform a leak test, which was negative. No bubbles were seen.  The staple line was hemostatic and the lumen was evenly tubular without undue narrowing, angulation or twisting specifically at the incisura angularis. The lumen was decompressed and the scope was withdrawn without difficulty.   ° °Kylynn Street A Greta Yung, M.D. °General, Bariatric, & Minimally Invasive Surgery °[facility] ° ° °

## 2024-08-10 NOTE — Interval H&P Note (Signed)
 History and Physical Interval Note:  08/10/2024 7:22 AM  [name]  has presented today for surgery, with the diagnosis of MORBID OBESITY.  The various methods of treatment have been discussed with the patient and family. After consideration of risks, benefits and other options for treatment, the patient has consented to  Procedures: GASTRECTOMY, SLEEVE, LAPAROSCOPIC (N/A) ENDOSCOPY, UPPER GI TRACT (N/A) as a surgical intervention.  The patient's history has been reviewed, patient examined, no change in status, stable for surgery.  I have reviewed the patient's chart and labs.  Questions were answered to the patient's satisfaction.     [name]

## 2024-08-10 NOTE — Progress Notes (Signed)
 PHARMACY CONSULT FOR:  Risk Assessment for Post-Discharge VTE Following Bariatric Surgery  Procedure* Gastric Sleeve, lap, upper endo  Sex F/M  Black race N/Y  Age (years) 38  BMI (kg/m2) 41.5  Operation duration (minutes) 68  History of VTE requiring treatment* N  Hypercoagulable condition* N  Liver disorder* Fatty liver, no significant liver dysfunction noted.  Pre-op venous stasis N  Pre-op functional health status Independent / Partially Dependent / Totally Dependent  Previous foregut or bariatric surg N  Post-op surgical site infection   Transfusion intra- or post-op*   Unplanned readmission   Unplanned reoperation   GI perforation/leak/obstruction*   *specific risk factors for portomesenteric venous thrombosis   Predicted probability of 30-day post-discharge VTE:    0.26 % estimated using the [facility] Calculator  Other patient-specific factors to consider:   Recommendation for Discharge: No pharmacologic prophylaxis post-discharge     Rhonda [name] is a 38 y.o. female who underwent Laparoscopic sleeve gastrectomy with upper gi tract endoscopy on 08/10/2024.   Case start: 0755 Case end: 0903   Allergies[1]  Patient Measurements: Weight: 130.3 kg (287 lb 3.2 oz) Body mass index is 41.8 kg/m.  No results for input(s): WBC, HGB, HCT, PLT, APTT, CREATININE, LABCREA, CREAT24HRUR, MG, PHOS, ALBUMIN, PROT, AST, ALT, ALKPHOS, BILITOT, BILIDIR, IBILI in the last 72 hours. Estimated Creatinine Clearance: 132.7 mL/min (by C-G formula based on SCr of 0.84 mg/dL).    Past Medical History:  Diagnosis Date   ADHD (attention deficit hyperactivity disorder)    Anxiety    Depression    treated   History of kidney stones    Remote Hx,   Hypertension    PCOS (polycystic ovarian syndrome) 2008   Plantar fasciitis    Sleep apnea    uses CPAP     Medications Prior to Admission  Medication Sig  Dispense Refill Last Dose/Taking   busPIRone  (BUSPAR ) 15 MG tablet Take 1 tablet (15 mg total) by mouth 2 (two) times daily. 180 tablet 1 08/10/2024 at  4:30 AM   Levomefolate Glucosamine (METHYL-FOLATE) 1700 MCG CAPS Take 1,000 mg by mouth daily.   Past Month   levonorgestrel  (MIRENA ) 20 MCG/24HR IUD 1 each by Intrauterine route once.   Taking   lisdexamfetamine  (VYVANSE ) 20 MG capsule Take 1 capsule (20 mg total) by mouth in the morning. 90 capsule 0 08/08/2024   Multiple Vitamin (MULTIVITAMIN) tablet Take 1 tablet by mouth daily.   Past Month   sertraline  (ZOLOFT ) 50 MG tablet Take 1 tablet (50 mg total) by mouth daily. 90 tablet 0 08/10/2024 at  4:30 AM   spironolactone  (ALDACTONE ) 100 MG tablet Take 1 tablet (100 mg total) by mouth 2 (two) times daily 180 tablet 3 08/09/2024   zolpidem  (AMBIEN ) 5 MG tablet Take 1 tablet (5 mg total) by mouth at bedtime as needed for sleep. 30 tablet 1 08/09/2024   hydrOXYzine  (VISTARIL ) 25 MG capsule Take 1-2 capsules (25-50 mg total) by mouth at bedtime as needed for sleep 180 capsule 0 More than a month   Probiotic Product (PROBIOTIC-10 PO) Take by mouth.   More than a month   Vitamin D , Ergocalciferol , (DRISDOL ) 1.25 MG (50000 UNIT) CAPS capsule Take 1 capsule (50,000 Units total) by mouth 2 (two) times a week. 8 capsule 0 08/06/2024       Thank you for allowing pharmacy to be a part of this patients care.  Eleanor EMERSON Agent, PharmD, BCPS Clinical Pharmacist [facility]  08/10/2024 11:03 AM      [1] No Known Allergies

## 2024-08-10 NOTE — Discharge Instructions (Signed)

## 2024-08-10 NOTE — Interval H&P Note (Signed)
 History and Physical Interval Note:  08/10/2024 7:22 AM  Rhonda [name]  has presented today for surgery, with the diagnosis of MORBID OBESITY.  The various methods of treatment have been discussed with the patient and family. After consideration of risks, benefits and other options for treatment, the patient has consented to  Procedures: GASTRECTOMY, SLEEVE, LAPAROSCOPIC (N/A) ENDOSCOPY, UPPER GI TRACT (N/A) as a surgical intervention.  The patient's history has been reviewed, patient examined, no change in status, stable for surgery.  I have reviewed the patient's chart and labs.  Questions were answered to the patient's satisfaction.     [name]

## 2024-08-10 NOTE — Anesthesia Procedure Notes (Signed)
 Procedure Name: Intubation Date/Time: 08/10/2024 7:34 AM  Performed by: Mirakle Tomlin D, CRNAPre-anesthesia Checklist: Patient identified, Emergency Drugs available, Suction available and Patient being monitored Patient Re-evaluated:Patient Re-evaluated prior to induction Oxygen Delivery Method: Circle system utilized Preoxygenation: Pre-oxygenation with 100% oxygen Induction Type: IV induction Ventilation: Mask ventilation without difficulty Laryngoscope Size: Mac and 4 Grade View: Grade II Tube type: Oral Tube size: 7.0 mm Number of attempts: 1 Airway Equipment and Method: Stylet and Oral airway Placement Confirmation: ETT inserted through vocal cords under direct vision, positive ETCO2 and breath sounds checked- equal and bilateral Secured at: 22 cm Tube secured with: Tape Dental Injury: Teeth and Oropharynx as per pre-operative assessment

## 2024-08-10 NOTE — Anesthesia Postprocedure Evaluation (Signed)
"   Anesthesia Post Note  Patient: Rhonda [name]  Procedure(s) Performed: GASTRECTOMY, SLEEVE, LAPAROSCOPIC (Abdomen) ENDOSCOPY, UPPER GI TRACT     Patient location during evaluation: PACU Anesthesia Type: General Level of consciousness: awake and alert, oriented and patient cooperative Pain management: pain level controlled Vital Signs Assessment: post-procedure vital signs reviewed and stable Respiratory status: spontaneous breathing, nonlabored ventilation and respiratory function stable Cardiovascular status: blood pressure returned to baseline and stable Postop Assessment: no apparent nausea or vomiting Anesthetic complications: no   No notable events documented.  Last Vitals:  Vitals:   08/10/24 1047 08/10/24 1150  BP: (!) 149/94 (!) 144/87  Pulse: 94 95  Resp: 18 18  Temp: (!) 36.3 C 36.6 C  SpO2: 97% 94%    Last Pain:  Vitals:   08/10/24 1047  TempSrc: Oral  PainSc:                  [name]      "

## 2024-08-10 NOTE — Op Note (Signed)
 08/10/2024 Rhonda [name] 1985/08/26 [id number]   PRE-OPERATIVE DIAGNOSIS:   Severe obesity (BMI 42)  PCOS (polycystic ovarian syndrome)  Mild obstructive sleep apnea  Vitamin D  insufficiency  Elevated transaminase level    POST-OPERATIVE DIAGNOSIS:  same + fatty liver  PROCEDURE:  Procedure(s): LAPAROSCOPIC SLEEVE GASTRECTOMY  UPPER GI ENDOSCOPY Laparoscopic bilateral TAP block  SURGEON:  Surgeon(s): Camellia CHRISTELLA Blush, MD FACS FASMBS  ASSISTANTS: Mitzie Freund MD FACS  ANESTHESIA:   general  DRAINS: none   BOUGIE: 40 fr ViSiGi  LOCAL MEDICATIONS USED:   bupivacaine   EBL: <25 mL  SPECIMEN:  Source of Specimen:  Greater curvature of stomach  DISPOSITION OF SPECIMEN:  PATHOLOGY  COUNTS:  YES  INDICATION FOR PROCEDURE: This is a very pleasant 38 y.o.-year-old morbidly obese female who has had unsuccessful attempts for sustained weight loss. The patient presents today for a planned laparoscopic sleeve gastrectomy with upper endoscopy. We have discussed the risk and benefits of the procedure extensively preoperatively. Please see my separate notes.  PROCEDURE: After obtaining informed consent and receiving 5000 units of subcutaneous heparin , the patient was brought to the operating room at [facility] and placed supine on the operating room table. General endotracheal anesthesia was established. Sequential compression devices were placed. A orogastric tube was placed. The patient's abdomen was prepped and draped in the usual standard surgical fashion. The patient received preoperative IV antibiotic. A surgical timeout was performed. ERAS protocol used.   Access to the abdomen was achieved using a 5 mm 0 laparoscope thru a 5 mm trocar In the left upper Quadrant 2 fingerbreadths below the left subcostal margin using the Optiview technique. Pneumoperitoneum was smoothly established up to 15 mm of mercury. The laparoscope was advanced and the abdominal cavity was  surveilled. The patient was then placed in reverse Trendelenburg.   A 5 mm trocar was placed slightly above and to the left of the umbilicus under direct visualization.  The [facility] liver retractor was placed under the left lobe of the liver through a 5 mm trocar incision site in the subxiphoid position. A 5 mm trocar was placed in the lateral right upper quadrant along with a 15 mm trocar in the mid right abdomen. A final 5 mm trocar was placed in the lateral LUQ.  All under direct visualization after local had been infiltrated in bilateral lateral upper abdominal walls as a TAP block for postoperative pain relief.  The stomach was inspected. It was completely decompressed and the orogastric tube was removed.  There was no anterior dimple that was obviously visible.  Her preoperative upper GI showed no hiatal hernia.   We identified the pylorus and measured 6 cm proximal to the pylorus and identified an area of where we would start taking down the short gastric vessels. Harmonic scalpel was used to take down the short gastric vessels along the greater curvature of the stomach. We were able to enter the lesser sac. We continued to march along the greater curvature of the stomach taking down the short gastrics. As we approached the gastrosplenic ligament we took care in this area not to injure the spleen. We were able to take down the entire gastrosplenic ligament. We then mobilized the fundus away from the left crus of diaphragm. There were a few posterior gastric avascular attachments which were taken down. This left the stomach completely mobilized. No vessels had been taken down along the lesser curvature of the stomach.  We then reidentified the pylorus. A 40Fr ViSiGi  was then placed in the oropharynx and advanced down into the stomach and placed in the distal antrum and positioned along the lesser curvature. It was placed under suction which secured the 40Fr ViSiGi in place along the lesser curve.  Then using the Ethicon echelon 60 mm stapler with a gold load with ethicon staple line reinforcement (ESLR), I placed a stapler along the antrum approximately 5 cm from the pylorus. The stapler was angled so that there is ample room at the angularis incisura. I then fired the first staple load after inspecting it posteriorly to ensure adequate space both anteriorly and posteriorly. At this point I still was not completely past the angularis so with a blue load with ESLR, I placed the stapler in position just inside the prior stapleline. We then rotated the stomach to insure that there was adequate anteriorly as well as posteriorly. The stapler was then fired.  At this point I continued using 60 mm blue load staple cartridges with ESLR. The echelon stapler was then repositioned with a 60 mm blue load with ESLR and we continued to march up along the ViSiGi. My assistant was holding traction along the greater curvature stomach along the cauterized short gastric vessels ensuring that the stomach was symmetrically retracted. Prior to each firing of the staple, we rotated the stomach to ensure that there is adequate stomach left.  As we approached the fundus, I used 60 mm blue cartridge with ESLR aiming  lateral to the GE junction after mobilizing some of the esophageal fat pad.  The sleeve was inspected. There is no evidence of cork screw. The staple line appeared hemostatic. The CRNA inflated the ViSiGi to the green zone and the upper abdomen was flooded with saline. There were no bubbles. The sleeve was decompressed and the ViSiGi removed.  Pneumoperitoneum was reduced to 8 mmHg so we can inspect for bleeding along the staple line.  There was a small amount of oozing from the 1st and 2nd fires of the stapler as well as the fourth staple line.  These were reinforced with clips.  I did place 1 clip on the second to last staple line as well.  It should be noted that there were no clips along the third staple line from the  antrum for future reference. my assistant scrubbed out and performed an upper endoscopy. The sleeve easily distended with air and the scope was easily advanced to the pylorus. There is no evidence of internal bleeding or cork screwing. There was no narrowing at the angularis. There is no evidence of bubbles. Please see her operative note for further details. The gastric sleeve was decompressed and the endoscope was removed.  There was no bleeding along the sleeve staple line.  The greater curvature the stomach was grasped with a laparoscopic grasper and removed from the 15 mm trocar site.  The liver retractor was removed. I then closed the 15 mm trocar site with 1 interrupted 0 Vicryl sutures through the fascia using the endoclose. The closure was viewed laparoscopically and it was airtight. Remaining local was then infiltrated in the preperitoneal spaces around the trocar sites. Pneumoperitoneum was released. All trocar sites were closed with a 4-0 Monocryl in a subcuticular fashion followed by the application of steri-strips, and bandaids. The patient was extubated and taken to the recovery room in stable condition. All needle, instrument, and sponge counts were correct x2. There are no immediate complications  (0) 60 mm green with ESLR (1) 60 mm gold with  ESLR (4) 60 mm blue with ESLR - last blue without ESLR  PLAN OF CARE: Admit to inpatient   PATIENT DISPOSITION:  PACU - hemodynamically stable.   Delay start of Pharmacological VTE agent (>24hrs) due to surgical blood loss or risk of bleeding:  no  Camellia [name]. Tanda, MD, FACS FASMBS General, Bariatric, & Minimally Invasive Surgery [facility],  A [facility]

## 2024-08-11 ENCOUNTER — Encounter (HOSPITAL_COMMUNITY): Payer: Self-pay | Admitting: General Surgery

## 2024-08-11 ENCOUNTER — Other Ambulatory Visit (HOSPITAL_COMMUNITY): Payer: Self-pay

## 2024-08-11 LAB — COMPREHENSIVE METABOLIC PANEL WITH GFR
ALT: 54 U/L — ABNORMAL HIGH (ref 0–44)
AST: 36 U/L (ref 15–41)
Albumin: 4.1 g/dL (ref 3.5–5.0)
Alkaline Phosphatase: 74 U/L (ref 38–126)
Anion gap: 10 (ref 5–15)
BUN: 7 mg/dL (ref 6–20)
CO2: 24 mmol/L (ref 22–32)
Calcium: 9.1 mg/dL (ref 8.9–10.3)
Chloride: 104 mmol/L (ref 98–111)
Creatinine, Ser: 0.74 mg/dL (ref 0.44–1.00)
GFR, Estimated: >60 mL/min
Glucose, Bld: 131 mg/dL — ABNORMAL HIGH (ref 70–99)
Potassium: 4 mmol/L (ref 3.5–5.1)
Sodium: 137 mmol/L (ref 135–145)
Total Bilirubin: 0.4 mg/dL (ref 0.0–1.2)
Total Protein: 7.1 g/dL (ref 6.5–8.1)

## 2024-08-11 LAB — CBC WITH DIFFERENTIAL/PLATELET
Abs Immature Granulocytes: 0.06 K/uL (ref 0.00–0.07)
Basophils Absolute: 0 K/uL (ref 0.0–0.1)
Basophils Relative: 0 %
Eosinophils Absolute: 0 K/uL (ref 0.0–0.5)
Eosinophils Relative: 0 %
HCT: 39 % (ref 36.0–46.0)
Hemoglobin: 13.3 g/dL (ref 12.0–15.0)
Immature Granulocytes: 0 %
Lymphocytes Relative: 7 %
Lymphs Abs: 1.1 K/uL (ref 0.7–4.0)
MCH: 31 pg (ref 26.0–34.0)
MCHC: 34.1 g/dL (ref 30.0–36.0)
MCV: 90.9 fL (ref 80.0–100.0)
Monocytes Absolute: 1 K/uL (ref 0.1–1.0)
Monocytes Relative: 7 %
Neutro Abs: 12.6 K/uL — ABNORMAL HIGH (ref 1.7–7.7)
Neutrophils Relative %: 86 %
Platelets: 345 K/uL (ref 150–400)
RBC: 4.29 MIL/uL (ref 3.87–5.11)
RDW: 12.8 % (ref 11.5–15.5)
WBC: 14.7 K/uL — ABNORMAL HIGH (ref 4.0–10.5)
nRBC: 0 % (ref 0.0–0.2)

## 2024-08-11 LAB — SURGICAL PATHOLOGY

## 2024-08-11 MED ORDER — ONDANSETRON 4 MG PO TBDP
4.0000 mg | ORAL_TABLET | Freq: Four times a day (QID) | ORAL | 0 refills | Status: DC | PRN
  Filled 2024-08-11: qty 20, 5d supply, fill #0

## 2024-08-11 MED ORDER — PANTOPRAZOLE SODIUM 40 MG PO TBEC
40.0000 mg | DELAYED_RELEASE_TABLET | Freq: Every day | ORAL | 0 refills | Status: DC
  Filled 2024-08-11: qty 90, 90d supply, fill #0

## 2024-08-11 MED ORDER — GABAPENTIN 100 MG PO CAPS
100.0000 mg | ORAL_CAPSULE | Freq: Two times a day (BID) | ORAL | 0 refills | Status: DC
  Filled 2024-08-11: qty 10, 5d supply, fill #0

## 2024-08-11 MED ORDER — OXYCODONE HCL 5 MG PO TABS
5.0000 mg | ORAL_TABLET | Freq: Four times a day (QID) | ORAL | 0 refills | Status: DC | PRN
  Filled 2024-08-11: qty 5, 2d supply, fill #0

## 2024-08-11 MED ORDER — ACETAMINOPHEN 500 MG PO TABS: 1000.0000 mg | ORAL_TABLET | Freq: Three times a day (TID) | ORAL | Status: AC

## 2024-08-11 NOTE — Discharge Summary (Signed)
 Physician Discharge Summary  Rhonda [name] FMW:[id number] DOB: 1986-08-12 DOA: 08/10/2024  PCP: Franchot Houston, PA-C  Admit date: 08/10/2024 Discharge date: 08/11/2024  Recommendations for Outpatient Follow-up:    Follow-up Information     [name], MD. Go on 09/02/2024.   Specialty: General Surgery Why: @ 1015 am.  Please arrive 15 minutes prior to appointment.  Thank you Contact information: [address] [place] [address] [phone]         Maczis, Tonja Barban, PA-C. Go on 10/08/2024.   Specialty: General Surgery Why: @ 9 am.  Please arrive 15 minutes prior to appointment.  Thank you Contact information: [address] [place] [address] [phone]                Discharge Diagnoses:  Principal Problem:   S/P laparoscopic sleeve gastrectomy Severe obesity (BMI 42)  PCOS (polycystic ovarian syndrome)  Mild obstructive sleep apnea  Vitamin D  insufficiency  Elevated transaminase level  Fatty liver  Surgical Procedure: Laparoscopic Sleeve Gastrectomy, upper endoscopy  Discharge Condition: Good Disposition: Home  Diet recommendation: Postoperative sleeve gastrectomy diet (liquids only)  Filed Weights   08/10/24 0604 08/10/24 1136  Weight: 130.3 kg 130.3 kg     Hospital Course:  The patient was admitted for a planned laparoscopic sleeve gastrectomy. Please see operative note. Preoperatively the patient was given 5000 units of subcutaneous heparin  for DVT prophylaxis. Postoperative prophylactic heparin  dosing was started on the evening of postoperative day 0. ERAS protocol was used. On the evening of postoperative day 0, the patient was started on water and ice chips. On postoperative day 1 the patient had no fever or tachycardia and was tolerating water in their diet was gradually advanced throughout the day. The patient was ambulating without difficulty. Their vital signs are stable without fever or  tachycardia. Their hemoglobin had remained stable. The patient declined CPAP therapy. The patient had received discharge instructions and counseling. They were deemed stable for discharge and had met discharge criteria  BP (!) 132/92 (BP Location: Left Arm) Comment: notify to the nurse.  Pulse 82   Temp 98.5 F (36.9 C) (Oral)   Resp 16   Ht 5' 9.5 (1.765 m)   Wt 130.3 kg   SpO2 99%   BMI 41.80 kg/m   Gen: alert, NAD, non-toxic appearing Pupils: equal, no scleral icterus Pulm: symmetric chest rise CV: regular rate and rhythm Abd: soft, min approp tender, nondistended.  No cellulitis. No incisional hernia Ext: no edema, no calf tenderness Skin: no rash, no jaundice   Discharge Instructions  Discharge Instructions     Ambulate hourly while awake   Complete by: As directed    Call MD for:  difficulty breathing, headache or visual disturbances   Complete by: As directed    Call MD for:  persistant dizziness or light-headedness   Complete by: As directed    Call MD for:  persistant nausea and vomiting   Complete by: As directed    Call MD for:  redness, tenderness, or signs of infection (pain, swelling, redness, odor or green/yellow discharge around incision site)   Complete by: As directed    Call MD for:  severe uncontrolled pain   Complete by: As directed    Call MD for:  temperature >101 F   Complete by: As directed    Diet bariatric full liquid   Complete by: As directed    Discharge instructions   Complete by: As directed  See bariatric discharge instructions   Incentive spirometry   Complete by: As directed    Perform hourly while awake      Allergies as of 08/11/2024   No Known Allergies      Medication List     TAKE these medications    acetaminophen  500 MG tablet Commonly known as: TYLENOL  Take 2 tablets (1,000 mg total) by mouth every 8 (eight) hours for 5 days.   busPIRone  15 MG tablet Commonly known as: BUSPAR  Take 1 tablet (15 mg total)  by mouth 2 (two) times daily.   gabapentin  100 MG capsule Commonly known as: NEURONTIN  Take 1 capsule (100 mg total) by mouth every 12 (twelve) hours.   hydrOXYzine  25 MG capsule Commonly known as: Vistaril  Take 1-2 capsules (25-50 mg total) by mouth at bedtime as needed for sleep   levonorgestrel  20 MCG/24HR IUD Commonly known as: MIRENA  1 each by Intrauterine route once. Notes to patient: Medication may not be effective for the next 30 days.  Use precaution if necessary.     lisdexamfetamine  20 MG capsule Commonly known as: VYVANSE  Take 1 capsule (20 mg total) by mouth in the morning.   Methyl-Folate 1000 MCG Caps Generic drug: Levomefolate Glucosamine Take 1,000 mg by mouth daily.   multivitamin tablet Take 1 tablet by mouth daily.   ondansetron  4 MG disintegrating tablet Commonly known as: ZOFRAN -ODT Take 1 tablet (4 mg total) by mouth every 6 (six) hours as needed for nausea or vomiting.   oxyCODONE  5 MG immediate release tablet Commonly known as: Oxy IR/ROXICODONE  Take 1 tablet (5 mg total) by mouth every 6 (six) hours as needed for breakthrough pain or severe pain (pain score 7-10).   pantoprazole  40 MG tablet Commonly known as: PROTONIX  Take 1 tablet (40 mg total) by mouth daily.   PROBIOTIC-10 PO Take by mouth.   sertraline  50 MG tablet Commonly known as: ZOLOFT  Take 1 tablet (50 mg total) by mouth daily.   spironolactone  100 MG tablet Commonly known as: ALDACTONE  Take 1 tablet (100 mg total) by mouth 2 (two) times daily Notes to patient: Monitor Blood Pressure Daily and keep a log for primary care physician.  Monitor for symptoms of dehydration.  You may need to make changes to your medications with rapid weight loss.      Vitamin D  (Ergocalciferol ) 1.25 MG (50000 UNIT) Caps capsule Commonly known as: DRISDOL  Take 1 capsule (50,000 Units total) by mouth 2 (two) times a week.   zolpidem  5 MG tablet Commonly known as: AMBIEN  Take 1 tablet (5 mg total)  by mouth at bedtime as needed for sleep.        Follow-up Information     [name], MD. Go on 09/02/2024.   Specialty: General Surgery Why: @ 1015 am.  Please arrive 15 minutes prior to appointment.  Thank you Contact information: [address] [place] [address] [phone]         Maczis, Tonja Barban, PA-C. Go on 10/08/2024.   Specialty: General Surgery Why: @ 9 am.  Please arrive 15 minutes prior to appointment.  Thank you Contact information: [address] [place] [address] [phone]                  The results of significant diagnostics from this hospitalization (including imaging, microbiology, ancillary and laboratory) are listed below for reference.    Significant Diagnostic Studies: No results found.  Labs: Basic Metabolic Panel: Recent Labs  Lab 08/11/24 0452  NA 137  K 4.0  CL 104  CO2 24  GLUCOSE 131*  BUN 7  CREATININE 0.74  CALCIUM 9.1   Liver Function Tests: Recent Labs  Lab 08/11/24 0452  AST 36  ALT 54*  ALKPHOS 74  BILITOT 0.4  PROT 7.1  ALBUMIN 4.1    CBC: Recent Labs  Lab 08/10/24 1259 08/11/24 0452  WBC  --  14.7*  NEUTROABS  --  12.6*  HGB 14.2 13.3  HCT 41.0 39.0  MCV  --  90.9  PLT  --  345    CBG: No results for input(s): GLUCAP in the last 168 hours.  Principal Problem:   S/P laparoscopic sleeve gastrectomy   Time coordinating discharge: 15 min  Signed:  Camellia CHRISTELLA Blush, MD [facility] [phone] 08/11/2024, 11:35 AM

## 2024-08-11 NOTE — Progress Notes (Signed)
 Finished 8 ounces of protein, pain and nausea controlled.  Scheduled to meet with patient at 10 am to review bariatric discharge information.  Encouraged to continue to sip protein/water, ambulate in hallway, and utilize IS.  No questions at this time.

## 2024-08-11 NOTE — Progress Notes (Signed)
" °   08/11/24 1117  TOC Brief Assessment  Insurance and Status Reviewed  Patient has primary care physician Yes  Home environment has been reviewed resides in a private residence  Prior level of function: Independent  Prior/Current Home Services No current home services  Social Drivers of Health Review SDOH reviewed no interventions necessary  Readmission risk has been reviewed Yes  Transition of care needs no transition of care needs at this time    "

## 2024-08-11 NOTE — Progress Notes (Signed)
 Patient alert and oriented, pain is controlled. Patient is tolerating fluids, advanced to protein shake today, patient is tolerating well. Reviewed Gastric sleeve/bypass discharge instructions with patient and patient is able to articulate understanding. Provided information on BELT program, Support Group, BSTOP-D, and [facility] outpatient pharmacy. Communicated general update of patient status to bedside RN. All questions answered.  Bedside RN to print AVS and review. 24hr fluid recall is 800 per hydration protocol, bariatric nurse coordinator to make follow-up phone call within one week.

## 2024-08-11 NOTE — Progress Notes (Signed)
 Discharge mediations delivered to patient at the bedside

## 2024-08-11 NOTE — Progress Notes (Signed)
" °   08/11/24 0001  BiPAP/CPAP/SIPAP  BiPAP/CPAP/SIPAP Pt Type Adult  Reason BIPAP/CPAP not in use Other(comment) (pt refused for the night)    "

## 2024-08-17 ENCOUNTER — Telehealth (HOSPITAL_COMMUNITY): Payer: Self-pay | Admitting: *Deleted

## 2024-08-17 NOTE — Telephone Encounter (Signed)
 1. Tell me about your pain and pain management?     Pt stated that she ended her gabapentin  pain med. on yesterday.     2. Let's talk about fluid intake. How much total fluid are you taking in?   Pt states that s/he is getting in at least 40 oz of fluid including protein shakes, bottled water, and soups.   3. How much protein have you taken in the last day?    Pt states she is working towards the goal of 60g of protein each day with the protein shakes.  4. Have you had nausea? Tell me about when you have experienced nausea and what you did to help?   Pt denies nausea.   5. Has the frequency or color changed with your urine?   Pt states that s/he is urinating fine with no changes in frequency or urgency.   6. Tell me what your incisions look like?   Incisions look fine. Pt denies a fever, chills. Pt states incisions are not swollen, open, or draining. Pt encouraged to call [facility] if incisions change.   7. Have you been passing gas? BM?   Pt states that they are having BMs.  Pt states that they have had a BM. Pt instructed to take either Miralax or MoM as instructed per Gastric Bypass/Sleeve Discharge Home Care Instructions. Pt to call surgeon's office if not able to have BM with medication.   8. If a problem or question were to arise who would you call? Do you know contact numbers for [facility], [facility], and [facility]?   Pt knows to call [facility] for surgical, [facility] for nutrition, and [facility] for non-urgent questions or concerns. Pt denies dehydration symptoms. Pt can describe s/sx of dehydration.

## 2024-08-25 ENCOUNTER — Encounter: Payer: Self-pay | Admitting: Dietician

## 2024-08-25 ENCOUNTER — Encounter: Attending: General Surgery | Admitting: Dietician

## 2024-08-25 VITALS — Ht 69.0 in | Wt 266.6 lb

## 2024-08-25 DIAGNOSIS — E669 Obesity, unspecified: Secondary | ICD-10-CM | POA: Insufficient documentation

## 2024-08-25 NOTE — Progress Notes (Signed)
 2 Week Post-Operative Nutrition Class   Class start Time: 1520   Class End Time: 1605  This was a class of 2 patients.   Patient was seen on 08/25/2024 for Post-Operative Nutrition education at the [facility].    Surgery date: 08/10/2024 Surgery type: Sleeve Gastrectomy  Anthropometrics  Start weight at [facility]: 280.5 lbs (date: 06/15/2024) Height: 69 in Weight today: 266.6 lbs  Clinical  Medical hx: obesity, sleep apnea, kidney stones Medications: Vit D, multivitamin, xyzal (mostly in the spring), Ambien , vyvance, mirena , Zoloft , aldactone , inderal , vistaril   Labs: Vit D 27.7 Notable signs/symptoms: none noted Any previous deficiencies? No Bowel Habits: Every day to every other day no complaints   Body Composition Scale 08/25/2024  Current Body Weight 266.6  Total Body Fat % 43.3  Visceral Fat 12  Fat-Free Mass % 56.6   Total Body Water % 42.8  Muscle-Mass lbs 36.5  BMI 39.1  Body Fat Displacement          Torso  lbs 71.6         Left Leg  lbs 14.3         Right Leg  lbs 14.3         Left Arm  lbs 7.1         Right Arm  lbs 7.1    The following the learning objectives were met by the patient during this course: Identifies Soft Prepped Plan Advancement Guide  Identifies Soft, High Proteins (Phase 1), beginning 2 weeks post-operatively to 3 weeks post-operatively Identifies Additional Soft High Proteins, soft non-starchy vegetables, fruits and starches (Phase 2), beginning 3 weeks post-operatively to 3 months post-operatively Identifies appropriate sources of fluids, proteins, vegetables, fruits and starches Identifies appropriate fat sources and healthy verses unhealthy fat types   States protein, vegetable, fruit and starch recommendations and appropriate sources post-operatively Identifies the need for appropriate texture modifications, mastication, and bite sizes when consuming solids Identifies appropriate fat consumption and  sources Identifies appropriate multivitamin and calcium sources post-operatively Describes the need for physical activity post-operatively and will follow MD recommendations States when to call healthcare provider regarding medication questions or post-operative complications   Handouts given during class include: Soft Prepped Plan Advancement Guide   Follow-Up Plan: Patient will follow-up at [facility] in 10 weeks for 3 month post-op nutrition visit for diet advancement per MD.

## 2024-09-03 ENCOUNTER — Telehealth: Payer: Self-pay | Admitting: Dietician

## 2024-09-03 NOTE — Telephone Encounter (Signed)
 RD called pt to verify fluid intake once starting soft, solid proteins 2 week post-bariatric surgery.   Daily Fluid intake:  Daily Protein intake:  Bowel Habits:   Concerns/issues:   Left Voice Message with call back number

## 2024-09-15 ENCOUNTER — Encounter (INDEPENDENT_AMBULATORY_CARE_PROVIDER_SITE_OTHER): Payer: Self-pay | Admitting: Adult Health

## 2024-09-15 ENCOUNTER — Ambulatory Visit (INDEPENDENT_AMBULATORY_CARE_PROVIDER_SITE_OTHER): Admitting: Adult Health

## 2024-09-16 ENCOUNTER — Telehealth: Admitting: Psychiatry

## 2024-09-16 ENCOUNTER — Encounter: Payer: Self-pay | Admitting: Psychiatry

## 2024-09-16 DIAGNOSIS — G4701 Insomnia due to medical condition: Secondary | ICD-10-CM | POA: Diagnosis not present

## 2024-09-16 DIAGNOSIS — F3342 Major depressive disorder, recurrent, in full remission: Secondary | ICD-10-CM

## 2024-09-16 DIAGNOSIS — F411 Generalized anxiety disorder: Secondary | ICD-10-CM | POA: Diagnosis not present

## 2024-09-16 DIAGNOSIS — F900 Attention-deficit hyperactivity disorder, predominantly inattentive type: Secondary | ICD-10-CM | POA: Diagnosis not present

## 2024-09-16 NOTE — Progress Notes (Unsigned)
 Virtual Visit via Video Note  I connected with Rhonda [name] on 09/16/24 at  3:00 PM EST by a video enabled telemedicine application and verified that I am speaking with the correct person using two identifiers. Location Provider Location : [facility] Patient Location : Home  Participants: Patient , Provider    I discussed the limitations of evaluation and management by telemedicine and the availability of in person appointments. The patient expressed understanding and agreed to proceed.  I discussed the assessment and treatment plan with the patient. The patient was provided an opportunity to ask questions and all were answered. The patient agreed with the plan and demonstrated an understanding of the instructions.   The patient was advised to call back or seek an in-person evaluation if the symptoms worsen or if the condition fails to improve as anticipated.   BH MD OP Progress Note  09/16/2024 3:16 PM GARY BULTMAN  MRN:  [id number]  Chief Complaint:  Chief Complaint  Patient presents with   Medication Refill   Follow-up   Anxiety   Depression   ADHD   Discussed the use of AI scribe software for clinical note transcription with the patient, who gave verbal consent to proceed.  History of Present Illness Rhonda [name] is a 39 year old Caucasian female divorced, employed, lives in [place], has a history of MDD, GAD, insomnia, ADHD was evaluated by telemedicine today for a follow-up appointment.  She had her gastric sleeve surgery completed on August 10, 2024.  She reports she is currently recovering well and has lost around 26 pounds.  She is currently on a liquid diet and will be transitioning to soft proteins soon.  She has not started working out or exercising regularly however she is working on therapy.  She took 2 weeks off from work and is currently back at work and doing well.  She denies any significant depression or anxiety symptoms.    She continues to  experience ongoing sleep difficulties, describing trouble staying asleep while taking immediate release Ambien . She previously used extended release Ambien  and plans to consult her surgeon regarding continued avoidance of extended release formulations following her recent gastric sleeve surgery.   She continues to take Vyvanse , which she finds helpful for focus.  She denies any side effects.  She denies any thoughts of harming herself or others.  She denies any perceptual disturbances.  She is planning to read 'Atomic Habits to help with developing a habit when it comes to daily exercise routine.     Visit Diagnosis:    ICD-10-CM   1. Recurrent major depressive disorder, in full remission  F33.42     2. GAD (generalized anxiety disorder)  F41.1     3. Insomnia due to medical condition  G47.01     4. Attention deficit hyperactivity disorder (ADHD), predominantly inattentive type  F90.0       Past Psychiatric History: I have reviewed past psychiatric history from progress note on 11/05/2018.  Past trials of medications like sertraline , Adderall, Wellbutrin , trazodone , Topamax , doxepin .  Past Medical History:  Past Medical History:  Diagnosis Date   ADHD (attention deficit hyperactivity disorder)    Anxiety    Depression    treated   History of kidney stones    Remote Hx,   Hypertension    PCOS (polycystic ovarian syndrome) 2008   Plantar fasciitis    Sleep apnea    uses CPAP    Past Surgical History:  Procedure Laterality Date  CHOLECYSTECTOMY     laparoscopic   LAPAROSCOPIC GASTRIC SLEEVE RESECTION N/A 08/10/2024   Procedure: GASTRECTOMY, SLEEVE, LAPAROSCOPIC;  Surgeon: [name], MD;  Location: [facility];  Service: General;  Laterality: N/A;   UPPER GI ENDOSCOPY N/A 08/10/2024   Procedure: ENDOSCOPY, UPPER GI TRACT;  Surgeon: [name], MD;  Location: [facility];  Service: General;  Laterality: N/A;   WISDOM TOOTH EXTRACTION      Family Psychiatric History: I have  reviewed family psychiatric history from progress note on 11/05/2018.  Family History:  Family History  Problem Relation Age of Onset   High blood pressure Mother    Depression Father    Liver disease Father    Alcoholism Father    Alcohol abuse Father    Diabetes Paternal Grandmother    Bipolar disorder Maternal Aunt     Social History: I have reviewed social history from progress note on 11/05/2018. Social History   Socioeconomic History   Marital status: Divorced    Spouse name: Dorn   Number of children: 2   Years of education: Not on file   Highest education level: Bachelor's degree (e.g., BA, AB, BS)  Occupational History   Occupation: RN  Tobacco Use   Smoking status: Never   Smokeless tobacco: Never  Vaping Use   Vaping status: Never Used  Substance and Sexual Activity   Alcohol use: Yes    Alcohol/week: 1.0 standard drink of alcohol    Types: 1 Glasses of wine per week    Comment: Occasional once a week.   Drug use: No   Sexual activity: Yes    Partners: Male    Birth control/protection: None, I.U.D.  Other Topics Concern   Not on file  Social History Narrative   Not on file   Social Drivers of Health   Tobacco Use: Low Risk (09/16/2024)   Patient History    Smoking Tobacco Use: Never    Smokeless Tobacco Use: Never    Passive Exposure: Not on file  Financial Resource Strain: Low Risk  (06/25/2024)   Received from [facility]   Overall Financial Resource Strain (CARDIA)    Difficulty of Paying Living Expenses: Not hard at all  Food Insecurity: No Food Insecurity (08/10/2024)   Epic    Worried About Running Out of Food in the Last Year: Never true    Ran Out of Food in the Last Year: Never true  Transportation Needs: No Transportation Needs (08/10/2024)   Epic    Lack of Transportation (Medical): No    Lack of Transportation (Non-Medical): No  Physical Activity: Not on file  Stress: Not on file  Social Connections: Unknown  (08/10/2024)   Social Connection and Isolation Panel    Frequency of Communication with Friends and Family: More than three times a week    Frequency of Social Gatherings with Friends and Family: More than three times a week    Attends Religious Services: Patient declined    Database Administrator or Organizations: Patient declined    Attends Banker Meetings: Patient declined    Marital Status: Not on file  Depression (PHQ2-9): Low Risk (08/06/2024)   Depression (PHQ2-9)    PHQ-2 Score: 0  Alcohol Screen: Not on file  Housing: Low Risk (08/10/2024)   Epic    Unable to Pay for Housing in the Last Year: No    Number of Times Moved in the Last Year: 0    Homeless in the Last  Year: No  Recent Concern: Housing - High Risk (05/27/2024)   Received [facility]   Epic    In the last 12 months, was there a time when you were not able to pay the mortgage or rent on time?: Yes    In the past 12 months, how many times have you moved where you were living?: 0    At any time in the past 12 months, were you homeless or living in a shelter (including now)?: No  Utilities: Not At Risk (08/10/2024)   Epic    Threatened with loss of utilities: No  Health Literacy: Not on file    Allergies: Allergies[1]  Metabolic Disorder Labs: Lab Results  Component Value Date   HGBA1C 5.1 03/17/2024   No results found for: PROLACTIN Lab Results  Component Value Date   CHOL 174 10/07/2023   TRIG 112 10/07/2023   HDL 41 10/07/2023   CHOLHDL 4.2 10/07/2023   LDLCALC 113 (H) 10/07/2023   LDLCALC 75 04/02/2022   Lab Results  Component Value Date   TSH 2.170 10/07/2023   TSH 1.680 01/24/2023    Therapeutic Level Labs: No results found for: LITHIUM No results found for: VALPROATE No results found for: CBMZ  Current Medications: Current Outpatient Medications  Medication Sig Dispense Refill   calcium carbonate (OSCAL) 1500 (600 Ca) MG TABS tablet Take 500  mg by mouth 3 (three) times daily with meals.     Multiple Vitamins-Minerals (BARIATRIC MULTIVITAMIN/IRON PO) Take by mouth.     busPIRone  (BUSPAR ) 15 MG tablet Take 1 tablet (15 mg total) by mouth 2 (two) times daily. 180 tablet 1   gabapentin  (NEURONTIN ) 100 MG capsule Take 1 capsule (100 mg total) by mouth every 12 (twelve) hours. 10 capsule 0   hydrOXYzine  (VISTARIL ) 25 MG capsule Take 1-2 capsules (25-50 mg total) by mouth at bedtime as needed for sleep 180 capsule 0   Levomefolate Glucosamine (METHYL-FOLATE) 1700 MCG CAPS Take 1,000 mg by mouth daily.     levonorgestrel  (MIRENA ) 20 MCG/24HR IUD 1 each by Intrauterine route once.     lisdexamfetamine  (VYVANSE ) 20 MG capsule Take 1 capsule (20 mg total) by mouth in the morning. 90 capsule 0   Multiple Vitamin (MULTIVITAMIN) tablet Take 1 tablet by mouth daily.     ondansetron  (ZOFRAN -ODT) 4 MG disintegrating tablet Dissolve 1 tablet (4 mg total) by mouth every 6 (six) hours as needed for nausea or vomiting. 20 tablet 0   oxyCODONE  (OXY IR/ROXICODONE ) 5 MG immediate release tablet Take 1 tablet (5 mg total) by mouth every 6 (six) hours as needed for breakthrough pain or severe pain (pain score 7-10). 5 tablet 0   pantoprazole  (PROTONIX ) 40 MG tablet Take 1 tablet (40 mg total) by mouth daily. 90 tablet 0   Probiotic Product (PROBIOTIC-10 PO) Take by mouth.     sertraline  (ZOLOFT ) 50 MG tablet Take 1 tablet (50 mg total) by mouth daily. 90 tablet 0   spironolactone  (ALDACTONE ) 100 MG tablet Take 1 tablet (100 mg total) by mouth 2 (two) times daily 180 tablet 3   Vitamin D , Ergocalciferol , (DRISDOL ) 1.25 MG (50000 UNIT) CAPS capsule Take 1 capsule (50,000 Units total) by mouth 2 (two) times a week. 8 capsule 0   zolpidem  (AMBIEN ) 5 MG tablet Take 1 tablet (5 mg total) by mouth at bedtime as needed for sleep. 30 tablet 1   No current facility-administered medications for this visit.     Musculoskeletal: Strength &  Muscle Tone: UTA Gait &  Station: Seated Patient leans: N/A  Psychiatric Specialty Exam: Review of Systems  Psychiatric/Behavioral: Negative.      There were no vitals taken for this visit.There is no height or weight on file to calculate BMI.  General Appearance: Casual  Eye Contact:  Fair  Speech:  Clear and Coherent  Volume:  Normal  Mood:  Euthymic  Affect:  Appropriate  Thought Process:  Goal Directed and Descriptions of Associations: Intact  Orientation:  Full (Time, Place, and Person)  Thought Content: Logical   Suicidal Thoughts:  No  Homicidal Thoughts:  No  Memory:  Immediate;   Fair Recent;   Fair Remote;   Fair  Judgement:  Fair  Insight:  Fair  Psychomotor Activity:  Normal  Concentration:  Concentration: Fair and Attention Span: Fair  Recall:  [employer] of Knowledge: Fair  Language: Fair  Akathisia:  No  Handed:  Right  AIMS (if indicated): not done  Assets:  Communication Skills Desire for Improvement Housing Social Support Vocational/Educational  ADL's:  Intact  Cognition: WNL  Sleep:  Fair   Screenings: GAD-7    Garment/textile Technologist Visit from 08/06/2024 in [facility] Office Visit from 04/08/2024 in [facility] Visit from 10/23/2023 in [facility] Visit from 08/27/2023 in [facility] Office Visit from 03/18/2023 in [facility]  Total GAD-7 Score 1 3 4 5 4    PHQ2-9    Flowsheet Row Office Visit from 08/06/2024 in [facility] Nutrition from 06/15/2024 in [facility]  - [facility] Of [place] Office Visit from 04/08/2024 in [facility] Office Visit from 10/23/2023 in [facility] Office Visit from 08/27/2023 in [facility]  PHQ-2 Total Score 0 0 0 0 1   Flowsheet Row Video Visit from 09/16/2024 in [facility] Admission (Discharged) from 08/10/2024 in [facility] Office Visit from 08/06/2024 in [facility]  C-SSRS RISK CATEGORY No Risk No Risk No Risk     Assessment and Plan: DALEXA GENTZ is a 39 year old Caucasian female who presented for a follow-up appointment, discussed assessment and plan as noted below.  1. Recurrent major depressive disorder, in full remission Currently denies any significant depression symptoms Continue Buspirone  15 mg twice daily Continue Zoloft  50 mg daily  2. GAD (generalized anxiety disorder)-stable Denies any anxiety symptoms well-managed on current medication regimen Continue Zoloft  50 mg daily  3. Insomnia due to medical condition-unstable Does have difficulty staying asleep on the immediate release Ambien .  Unable to use Ambien  extended release since she had gastric sleeve surgery.  Agrees to discuss with surgeon.  Will consider changing to another sleep aid in the future if needed Continue Ambien  5 mg at bedtime as needed Could use Melatonin combination medications over-the-counter like sleep #3, sleep #5 if needed Continue Hydroxyzine  25-50 mg at bedtime as needed  4. Attention deficit hyperactivity disorder (ADHD), predominantly inattentive type-stable Currently reports overall doing well on the Vyvanse  Continue Vyvanse  20 mg daily Reviewed [place] PMP AWARxE  Follow-up Follow-up in clinic in 3 months or sooner in person.    Consent: Patient/Guardian gives verbal consent for treatment and assignment of benefits for services provided during  this visit. Patient/Guardian expressed understanding and agreed to proceed.  This note was generated in part or whole with voice recognition software. Voice recognition is usually quite  accurate but there are transcription errors that can and very often do occur. I apologize for any typographical errors that were not detected and corrected.     Mia Milan, MD 09/16/2024, 3:16 PM     [1] No Known Allergies

## 2024-09-18 ENCOUNTER — Encounter (INDEPENDENT_AMBULATORY_CARE_PROVIDER_SITE_OTHER): Payer: Self-pay | Admitting: Adult Health

## 2024-09-18 ENCOUNTER — Ambulatory Visit (INDEPENDENT_AMBULATORY_CARE_PROVIDER_SITE_OTHER): Admitting: Adult Health

## 2024-09-18 ENCOUNTER — Other Ambulatory Visit: Payer: Self-pay

## 2024-09-18 ENCOUNTER — Other Ambulatory Visit: Payer: Self-pay | Admitting: Psychiatry

## 2024-09-18 ENCOUNTER — Other Ambulatory Visit (HOSPITAL_COMMUNITY): Payer: Self-pay

## 2024-09-18 VITALS — BP 125/78 | HR 88 | Temp 98.2°F | Ht 70.0 in | Wt 258.0 lb

## 2024-09-18 DIAGNOSIS — F900 Attention-deficit hyperactivity disorder, predominantly inattentive type: Secondary | ICD-10-CM

## 2024-09-18 DIAGNOSIS — Z6839 Body mass index (BMI) 39.0-39.9, adult: Secondary | ICD-10-CM

## 2024-09-18 DIAGNOSIS — E669 Obesity, unspecified: Secondary | ICD-10-CM | POA: Diagnosis not present

## 2024-09-18 DIAGNOSIS — Z6837 Body mass index (BMI) 37.0-37.9, adult: Secondary | ICD-10-CM

## 2024-09-18 DIAGNOSIS — E559 Vitamin D deficiency, unspecified: Secondary | ICD-10-CM

## 2024-09-18 DIAGNOSIS — Z9884 Bariatric surgery status: Secondary | ICD-10-CM

## 2024-09-18 DIAGNOSIS — R7401 Elevation of levels of liver transaminase levels: Secondary | ICD-10-CM

## 2024-09-18 DIAGNOSIS — R632 Polyphagia: Secondary | ICD-10-CM

## 2024-09-18 MED ORDER — VITAMIN D (ERGOCALCIFEROL) 1.25 MG (50000 UNIT) PO CAPS
50000.0000 [IU] | ORAL_CAPSULE | ORAL | 0 refills | Status: AC
  Filled 2024-09-18: qty 8, 28d supply, fill #0

## 2024-09-18 MED ORDER — LISDEXAMFETAMINE DIMESYLATE 20 MG PO CAPS
20.0000 mg | ORAL_CAPSULE | Freq: Every morning | ORAL | 0 refills | Status: AC
  Filled 2024-09-18: qty 90, 90d supply, fill #0

## 2024-09-18 NOTE — Progress Notes (Signed)
 "    WEIGHT SUMMARY AND BIOMETRICS  Vitals Temp: 98.2 F (36.8 C) BP: 125/78 Pulse Rate: 88 SpO2: 100 %   Anthropometric Measurements Height: 5' 10 (1.778 m) Weight: 258 lb (117 kg) BMI (Calculated): 37.02 Weight at Last Visit: 282lb Weight Lost Since Last Visit: 24lb Weight Gained Since Last Visit: 0lb Starting Weight: 273lb Total Weight Loss (lbs): 15 lb (6.804 kg)   Body Composition  Body Fat %: 42.1 % Fat Mass (lbs): 108.6 lbs Muscle Mass (lbs): 141.8 lbs Total Body Water (lbs): 95.4 lbs Visceral Fat Rating : 10   Other Clinical Data Fasting: No Labs: No Today's Visit #: 82 Starting Date: 01/09/18    Chief Complaint:   OBESITY Rhonda [name] is here to discuss her progress with her obesity treatment plan.  She is on the POST BARIATRIC SURGERY EATING PLAN  and states she is following her eating plan approximately 100 % of the time.  She states she is exercising: NEAT Activities  Interim History:  08/10/2024 Panel 1: GASTRECTOMY, SLEEVE, LAPAROSCOPIC with [name], MD  Panel 1: ENDOSCOPY, UPPER GI TRACT with [name], MD    08/10/24  Height 5' 9.5 (1.765 m)  Weight 287 lb 3.2 oz (130.3 kg)  BMI (Calculated) 41.82    09/18/24 08:00  Height 5' 10 (1.778 m)  Weight 258 lb (117 kg)  BMI (Calculated) 37.02   She is following post surgical eating plan without excessive hunger or GI upset  Reviewed Bioimpedance Results with pt: Muscle Mass:-6.6 lbs Adipose Mass:-17.2 lbs  Subjective:   1. S/P laparoscopic sleeve gastrectomy 08/10/2024 Panel 1: GASTRECTOMY, SLEEVE, LAPAROSCOPIC with [name], MD  Panel 1: ENDOSCOPY, UPPER GI TRACT with [name], MD    08/10/24  Height 5' 9.5 (1.765 m)  Weight 287 lb 3.2 oz (130.3 kg)  BMI (Calculated) 41.82    09/18/24 08:00  Height 5' 10 (1.778 m)  Weight 258 lb (117 kg)  BMI (Calculated) 37.02   2. Vitamin D  deficiency  Latest Reference Range & Units 01/24/23 09:39 09/02/23 09:13 03/17/24  09:11  Vitamin D , 25-Hydroxy 30.0 - 100.0 ng/mL 45.4 35.3 27.7 (L)  (L): Data is abnormally low  She is on daily Bariatric MVI and weekly Ergocalciferol   3. Polyphagia Appetite stable Denies mass in neck, dysphagia, dyspepsia, persistent hoarseness, abdominal pain, or N/V/C  08/10/2024  GASTRECTOMY, SLEEVE  Assessment/Plan:   1. S/P laparoscopic sleeve gastrectomy Continue eating plan per specialist F/u with Bariatric Surgical Team as advised  2. Vitamin D  deficiency (Primary) Refill - Vitamin D , Ergocalciferol , (DRISDOL ) 1.25 MG (50000 UNIT) CAPS capsule; Take 1 capsule (50,000 Units total) by mouth 2 (two) times a week.  Dispense: 8 capsule; Refill: 0  3. Polyphagia Continue healthy eating and increase regular exercise  4. Obesity, current BMI 37.0  Rhonda [name] is currently in the action stage of change. As such, her goal is to continue with weight loss efforts. She has agreed to POST BARIATRIC SURGERY EATING PLAN  Exercise goals: All adults should avoid inactivity. Some physical activity is better than none, and adults who participate in any amount of physical activity gain some health benefits. Adults should also include muscle-strengthening activities that involve all major muscle groups on 2 or more days a week.  Behavioral modification strategies: increasing lean protein intake, decreasing simple carbohydrates, increasing vegetables, meal planning and cooking strategies, keeping healthy foods in the home, ways to avoid boredom eating, and planning for success.  Robina has agreed to follow-up with our clinic in  4 weeks. She was informed of the importance of frequent follow-up visits to maximize her success with intensive lifestyle modifications for her multiple health conditions.   Objective:   Blood pressure 125/78, pulse 88, temperature 98.2 F (36.8 C), height 5' 10 (1.778 m), weight 258 lb (117 kg), SpO2 100%. Body mass index is 37.02 kg/m.  General: Cooperative,  alert, well developed, in no acute distress. HEENT: Conjunctivae and lids unremarkable. Cardiovascular: Regular rhythm.  Lungs: Normal work of breathing. Neurologic: No focal deficits.   Lab Results  Component Value Date   CREATININE 0.74 08/11/2024   BUN 7 08/11/2024   NA 137 08/11/2024   K 4.0 08/11/2024   CL 104 08/11/2024   CO2 24 08/11/2024   Lab Results  Component Value Date   ALT 54 (H) 08/11/2024   AST 36 08/11/2024   ALKPHOS 74 08/11/2024   BILITOT 0.4 08/11/2024   Lab Results  Component Value Date   HGBA1C 5.1 03/17/2024   HGBA1C 5.3 09/02/2023   HGBA1C 5.0 01/24/2023   HGBA1C 5.0 10/04/2022   HGBA1C 4.9 04/02/2022   Lab Results  Component Value Date   INSULIN  13.0 03/17/2024   INSULIN  11.7 09/02/2023   INSULIN  8.8 01/24/2023   INSULIN  10.8 10/04/2022   INSULIN  17.1 04/02/2022   Lab Results  Component Value Date   TSH 2.170 10/07/2023   Lab Results  Component Value Date   CHOL 174 10/07/2023   HDL 41 10/07/2023   LDLCALC 113 (H) 10/07/2023   TRIG 112 10/07/2023   CHOLHDL 4.2 10/07/2023   Lab Results  Component Value Date   VD25OH 27.7 (L) 03/17/2024   VD25OH 35.3 09/02/2023   VD25OH 45.4 01/24/2023   Lab Results  Component Value Date   WBC 14.7 (H) 08/11/2024   HGB 13.3 08/11/2024   HCT 39.0 08/11/2024   MCV 90.9 08/11/2024   PLT 345 08/11/2024   No results found for: IRON, TIBC, FERRITIN  Attestation Statements:   Reviewed by clinician on day of visit: allergies, medications, problem list, medical history, surgical history, family history, social history, and previous encounter notes.  I have reviewed the above documentation for accuracy and completeness, and I agree with the above. -  Tameyah Koch d. Neaveh Belanger, NP-C "

## 2024-11-03 ENCOUNTER — Encounter: Admitting: Dietician

## 2024-12-17 ENCOUNTER — Ambulatory Visit: Admitting: Psychiatry
